# Patient Record
Sex: Female | Born: 1935 | Race: Black or African American | Hispanic: No | Smoking: Never smoker
Health system: Southern US, Community
[De-identification: ages and names within clinical notes are randomized; demographics above are authoritative.]

## PROBLEM LIST (undated history)

## (undated) DIAGNOSIS — C801 Malignant (primary) neoplasm, unspecified: Secondary | ICD-10-CM

## (undated) DIAGNOSIS — T8859XA Other complications of anesthesia, initial encounter: Secondary | ICD-10-CM

## (undated) DIAGNOSIS — I471 Supraventricular tachycardia, unspecified: Secondary | ICD-10-CM

## (undated) DIAGNOSIS — M1990 Unspecified osteoarthritis, unspecified site: Secondary | ICD-10-CM

## (undated) DIAGNOSIS — T4145XA Adverse effect of unspecified anesthetic, initial encounter: Secondary | ICD-10-CM

## (undated) DIAGNOSIS — K922 Gastrointestinal hemorrhage, unspecified: Secondary | ICD-10-CM

## (undated) DIAGNOSIS — I10 Essential (primary) hypertension: Secondary | ICD-10-CM

## (undated) DIAGNOSIS — E43 Unspecified severe protein-calorie malnutrition: Secondary | ICD-10-CM

## (undated) HISTORY — DX: Malignant (primary) neoplasm, unspecified: C80.1

## (undated) HISTORY — DX: Essential (primary) hypertension: I10

## (undated) HISTORY — PX: CHOLECYSTECTOMY: SHX55

## (undated) HISTORY — PX: ABDOMINAL HYSTERECTOMY: SHX81

## (undated) HISTORY — DX: Unspecified osteoarthritis, unspecified site: M19.90

---

## 2003-07-29 ENCOUNTER — Emergency Department (HOSPITAL_COMMUNITY): Admission: EM | Admit: 2003-07-29 | Discharge: 2003-07-29 | Payer: Self-pay | Admitting: Emergency Medicine

## 2004-05-05 ENCOUNTER — Emergency Department (HOSPITAL_COMMUNITY): Admission: EM | Admit: 2004-05-05 | Discharge: 2004-05-05 | Payer: Self-pay | Admitting: Emergency Medicine

## 2004-05-14 ENCOUNTER — Ambulatory Visit: Payer: Self-pay | Admitting: Internal Medicine

## 2004-05-25 ENCOUNTER — Emergency Department (HOSPITAL_COMMUNITY): Admission: EM | Admit: 2004-05-25 | Discharge: 2004-05-25 | Payer: Self-pay

## 2004-05-26 ENCOUNTER — Emergency Department (HOSPITAL_COMMUNITY): Admission: EM | Admit: 2004-05-26 | Discharge: 2004-05-26 | Payer: Self-pay

## 2004-05-26 ENCOUNTER — Ambulatory Visit: Payer: Self-pay | Admitting: Internal Medicine

## 2004-10-08 ENCOUNTER — Ambulatory Visit: Payer: Self-pay | Admitting: Internal Medicine

## 2004-12-25 ENCOUNTER — Ambulatory Visit: Payer: Self-pay | Admitting: Internal Medicine

## 2005-02-13 ENCOUNTER — Ambulatory Visit: Payer: Self-pay | Admitting: Internal Medicine

## 2005-02-13 ENCOUNTER — Ambulatory Visit: Payer: Self-pay | Admitting: Cardiology

## 2005-02-20 ENCOUNTER — Ambulatory Visit: Payer: Self-pay | Admitting: Internal Medicine

## 2005-03-01 ENCOUNTER — Ambulatory Visit: Payer: Self-pay | Admitting: Internal Medicine

## 2005-12-23 ENCOUNTER — Ambulatory Visit: Payer: Self-pay | Admitting: Internal Medicine

## 2005-12-27 ENCOUNTER — Emergency Department (HOSPITAL_COMMUNITY): Admission: EM | Admit: 2005-12-27 | Discharge: 2005-12-27 | Payer: Self-pay | Admitting: Emergency Medicine

## 2006-02-04 ENCOUNTER — Ambulatory Visit: Payer: Self-pay | Admitting: Internal Medicine

## 2006-05-12 ENCOUNTER — Ambulatory Visit: Payer: Self-pay | Admitting: Internal Medicine

## 2006-10-03 ENCOUNTER — Ambulatory Visit: Payer: Self-pay | Admitting: Internal Medicine

## 2007-01-05 ENCOUNTER — Emergency Department (HOSPITAL_COMMUNITY): Admission: EM | Admit: 2007-01-05 | Discharge: 2007-01-05 | Payer: Self-pay | Admitting: Emergency Medicine

## 2007-01-08 ENCOUNTER — Ambulatory Visit: Payer: Self-pay | Admitting: Internal Medicine

## 2007-02-03 ENCOUNTER — Encounter (INDEPENDENT_AMBULATORY_CARE_PROVIDER_SITE_OTHER): Payer: Self-pay | Admitting: Surgery

## 2007-02-03 ENCOUNTER — Ambulatory Visit (HOSPITAL_COMMUNITY): Admission: RE | Admit: 2007-02-03 | Discharge: 2007-02-04 | Payer: Self-pay | Admitting: Surgery

## 2007-09-16 ENCOUNTER — Encounter: Payer: Self-pay | Admitting: Internal Medicine

## 2007-09-30 ENCOUNTER — Encounter: Payer: Self-pay | Admitting: *Deleted

## 2007-09-30 DIAGNOSIS — K5730 Diverticulosis of large intestine without perforation or abscess without bleeding: Secondary | ICD-10-CM | POA: Insufficient documentation

## 2007-09-30 DIAGNOSIS — R519 Headache, unspecified: Secondary | ICD-10-CM | POA: Insufficient documentation

## 2007-09-30 DIAGNOSIS — H409 Unspecified glaucoma: Secondary | ICD-10-CM | POA: Insufficient documentation

## 2007-09-30 DIAGNOSIS — M479 Spondylosis, unspecified: Secondary | ICD-10-CM | POA: Insufficient documentation

## 2007-09-30 DIAGNOSIS — Z8719 Personal history of other diseases of the digestive system: Secondary | ICD-10-CM | POA: Insufficient documentation

## 2007-09-30 DIAGNOSIS — R51 Headache: Secondary | ICD-10-CM

## 2007-09-30 DIAGNOSIS — Z9089 Acquired absence of other organs: Secondary | ICD-10-CM

## 2007-09-30 DIAGNOSIS — Z9849 Cataract extraction status, unspecified eye: Secondary | ICD-10-CM

## 2007-09-30 DIAGNOSIS — I10 Essential (primary) hypertension: Secondary | ICD-10-CM | POA: Insufficient documentation

## 2008-03-10 ENCOUNTER — Ambulatory Visit: Payer: Self-pay | Admitting: Internal Medicine

## 2008-03-10 DIAGNOSIS — K219 Gastro-esophageal reflux disease without esophagitis: Secondary | ICD-10-CM | POA: Insufficient documentation

## 2008-03-10 LAB — CONVERTED CEMR LAB
BUN: 24 mg/dL — ABNORMAL HIGH (ref 6–23)
Basophils Absolute: 0.3 10*3/uL — ABNORMAL HIGH (ref 0.0–0.1)
Basophils Relative: 3.8 % — ABNORMAL HIGH (ref 0.0–3.0)
CO2: 31 meq/L (ref 19–32)
Calcium: 9.4 mg/dL (ref 8.4–10.5)
Chloride: 101 meq/L (ref 96–112)
Creatinine, Ser: 1.6 mg/dL — ABNORMAL HIGH (ref 0.4–1.2)
Eosinophils Absolute: 0.1 10*3/uL (ref 0.0–0.7)
Eosinophils Relative: 1.9 % (ref 0.0–5.0)
GFR calc Af Amer: 39 mL/min
GFR calc non Af Amer: 33 mL/min
Glucose, Bld: 107 mg/dL — ABNORMAL HIGH (ref 70–99)
HCT: 39.5 % (ref 36.0–46.0)
Hemoglobin: 13.5 g/dL (ref 12.0–15.0)
Lymphocytes Relative: 26.5 % (ref 12.0–46.0)
MCHC: 34.1 g/dL (ref 30.0–36.0)
MCV: 87.4 fL (ref 78.0–100.0)
Monocytes Absolute: 0.4 10*3/uL (ref 0.1–1.0)
Monocytes Relative: 5.3 % (ref 3.0–12.0)
Neutro Abs: 4.6 10*3/uL (ref 1.4–7.7)
Neutrophils Relative %: 62.5 % (ref 43.0–77.0)
Platelets: 309 10*3/uL (ref 150–400)
Potassium: 4.3 meq/L (ref 3.5–5.1)
RBC: 4.52 M/uL (ref 3.87–5.11)
RDW: 13.2 % (ref 11.5–14.6)
Sodium: 140 meq/L (ref 135–145)
WBC: 7.3 10*3/uL (ref 4.5–10.5)

## 2008-03-14 ENCOUNTER — Encounter: Payer: Self-pay | Admitting: Internal Medicine

## 2008-09-15 ENCOUNTER — Encounter: Payer: Self-pay | Admitting: Internal Medicine

## 2008-11-13 ENCOUNTER — Emergency Department (HOSPITAL_COMMUNITY): Admission: EM | Admit: 2008-11-13 | Discharge: 2008-11-13 | Payer: Self-pay | Admitting: Emergency Medicine

## 2009-06-03 HISTORY — PX: BREAST SURGERY: SHX581

## 2009-06-21 ENCOUNTER — Telehealth: Payer: Self-pay | Admitting: Internal Medicine

## 2009-11-21 ENCOUNTER — Encounter: Payer: Self-pay | Admitting: Internal Medicine

## 2009-11-27 ENCOUNTER — Telehealth: Payer: Self-pay | Admitting: Internal Medicine

## 2009-11-30 ENCOUNTER — Encounter: Payer: Self-pay | Admitting: Internal Medicine

## 2009-12-08 ENCOUNTER — Telehealth: Payer: Self-pay | Admitting: Internal Medicine

## 2009-12-09 ENCOUNTER — Encounter: Payer: Self-pay | Admitting: Internal Medicine

## 2009-12-20 ENCOUNTER — Encounter: Payer: Self-pay | Admitting: Internal Medicine

## 2009-12-21 ENCOUNTER — Encounter: Payer: Self-pay | Admitting: Internal Medicine

## 2009-12-26 ENCOUNTER — Encounter: Admission: RE | Admit: 2009-12-26 | Discharge: 2009-12-26 | Payer: Self-pay | Admitting: Radiology

## 2009-12-28 ENCOUNTER — Ambulatory Visit: Payer: Self-pay | Admitting: Internal Medicine

## 2009-12-28 DIAGNOSIS — D0590 Unspecified type of carcinoma in situ of unspecified breast: Secondary | ICD-10-CM

## 2010-01-05 ENCOUNTER — Encounter: Payer: Self-pay | Admitting: Internal Medicine

## 2010-01-22 ENCOUNTER — Ambulatory Visit (HOSPITAL_COMMUNITY): Admission: RE | Admit: 2010-01-22 | Discharge: 2010-01-22 | Payer: Self-pay | Admitting: Surgery

## 2010-02-01 ENCOUNTER — Ambulatory Visit: Payer: Self-pay | Admitting: Oncology

## 2010-02-06 ENCOUNTER — Encounter: Payer: Self-pay | Admitting: Internal Medicine

## 2010-06-03 HISTORY — PX: BREAST SURGERY: SHX581

## 2010-07-05 NOTE — Medication Information (Signed)
Summary: Order/[facility]  Order/[facility]   Imported By: Lester [place] 12/13/2009 08:56:42  _____________________________________________________________________  External Attachment:    Type:   Image     Comment:   External Document

## 2010-07-05 NOTE — Letter (Signed)
Summary: [facility]   Imported By: Lennie Odor 02/19/2010 15:11:01  _____________________________________________________________________  External Attachment:    Type:   Image     Comment:   External Document

## 2010-07-05 NOTE — Progress Notes (Signed)
Summary: HYDRALAZINE REFILL  Phone Note Call from Patient Call back at Home Phone [phone]   Caller: Patient Summary of Call: MS Linhart ONLY HAS ENOUGH OF HER BP MEDS FOR TODAY.  LOV WAS 03-10-08.  SHE MADE A 30 MIN APPT ON JULY 28.  PLEASE CALL IN HER HYDRALAZINE 50 MG TO LAST UNTIL HER APPT.  HER PHARMACY IS [facility] [address]  HER PHONE NUMBER IS [phone]. Initial call taken by: Hilarie Fredrickson,  December 08, 2009 9:27 AM    Prescriptions: HYDRALAZINE HCL 50 MG  TABS (HYDRALAZINE HCL) Take three times a day  #90 Tablet x 0   Entered by:   Lamar Sprinkles, CMA   Authorized by:   Jacques Navy MD   Signed by:   Lamar Sprinkles, CMA on 12/08/2009   Method used:   Electronically to        [facility] [id number] (retail)       [address] [place], [place]  [address]       Ph: [phone]       Fax: [phone]   RxID:   [id number]

## 2010-07-05 NOTE — Assessment & Plan Note (Signed)
Summary: YEARLY FU/ NEEDS BP MEDS REFILLED/ LABS LATER/ NWS  #   Vital Signs:  Patient profile:   77 year old female Height:      66 inches Weight:      179 pounds BMI:     29.00 O2 Sat:      97 % on Room air Temp:     98.0 degrees F oral Pulse rate:   100 / minute BP sitting:   146 / 78  (left arm) Cuff size:   regular  Vitals Entered By: Bill Salinas CMA (December 28, 2009 1:35 PM)  O2 Flow:  Room air CC: cpx/ ab  Vision Screening:      Vision Comments: Pt has eye exam every 3 months, glaucoma both eyes   Primary Care Provider:  Jacques Navy MD  CC:  cpx/ ab.  History of Present Illness: Patient presents for follow-up. She recently had an abnormal mammogram with microcalcifications. This led to core needle biopsy returned as intraductal carcinoma in-situ. She has had breast MRI that was negative for any other lesions. She is now scheduled for surgical consult. She is very up-set and concerned that she may have cancer in other parts of her body and that the breast cancer is life-threatening.  She is otherwise doing well with no new medical problems or concerns.   Current Medications (verified): 1)  Lisinopril 20 Mg Tabs (Lisinopril) .... Take 1 Tablet By Mouth Once A Day 2)  Furosemide 40 Mg  Tabs (Furosemide) .... Take 1 Tablet By Mouth Once A Day 3)  Travatan 0.004 %  Soln (Travoprost) .... Use Daily As Directed. 4)  Hydralazine Hcl 50 Mg  Tabs (Hydralazine Hcl) .... Take Three Times A Day 5)  Tylenol 325 Mg Tabs (Acetaminophen) .... As Directed As Needed 6)  Zantac 150 Mg Tabs (Ranitidine Hcl) .[name].. 1 Tab As Needed  Allergies (verified): 1)  ! 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 CATARACT EXTRACTION, HX OF (ICD-V45.61) CHOLECYSTECTOMY, LAPAROSCOPIC, HX OF (ICD-V45.79) Hysterectomy  Family History: Reviewed history from 03/10/2008 and no changes required. non-contributory in a octagenarian  Social History: Reviewed history from 03/10/2008 and no changes required. 8th grade married '40-1980's  widowed 5 -sons; 2 daughters; lots of grandchildren; several great-grandchildren; 3 children in [place] who can look in on her. Lives in her own house; I-ADLs  Review of Systems  The patient denies anorexia, fever, weight loss, weight gain, chest pain, dyspnea on exertion, peripheral edema, prolonged cough, headaches, abdominal pain, severe indigestion/heartburn, incontinence, muscle weakness, difficulty walking, depression, unusual weight change, and enlarged lymph nodes.    Physical Exam  General:  WNWD elderly AA female who looks younger than her stated age.  Head:  Normocephalic and atraumatic without obvious abnormalities. No apparent alopecia or balding. Eyes:  C&S clear, pupils equal Neck:  supple and full ROM.   Lungs:  Normal respiratory effort, chest expands symmetrically. Lungs are clear to auscultation, no crackles or wheezes. Heart:  normal rate and regular rhythm.  Msk:  normal ROM, no joint swelling, and no redness over joints.   Pulses:  2+ radial Neurologic:  alert & oriented X3, cranial nerves II-XII intact, and gait normal.   Skin:  turgor normal and color normal.   Psych:  Oriented X3, memory intact for recent and remote, normally interactive, and good eye contact.     Impression & Recommendations:  Problem # 1:  CARCINOMA IN SITU OF  BREAST (ICD-233.0) Reviewed all available records in eChart and Imagecast. Discussed the general progrnosis with the patient - good prognosis and that she will most likely be offered lumpectomy which she be curative. At her advanced age  I doubbt she will require any adjuvant therapy asided from possibly tamoxifen or evista. She is reassured that studies to date have not revealed any spread of disease and that this is a less aggressive form of breast cancer than others.   Problem # 2:  GERD (ICD-530.81) Stable on her present medication  Her updated medication list for this problem includes:    Zantac 150 Mg Tabs (Ranitidine hcl) .[name]... 1 tab as needed  Problem # 3:  HYPERTENSION (ICD-401.9)  Her updated medication list for this problem includes:    Lisinopril 20 Mg Tabs (Lisinopril) .[name]... Take 1 tablet by mouth once a day    Furosemide 40 Mg Tabs (Furosemide) .[name]... Take 1 tablet by mouth once a day    Hydralazine Hcl 50 Mg Tabs (Hydralazine hcl) .[name]... Take three times a day  BP today: 146/78 Prior BP: 140/80 (03/10/2008)  Suboptimal but sufficient control. Will continue her present medications.   Complete Medication List: 1)  Lisinopril 20 Mg Tabs (Lisinopril) .... Take 1 tablet by mouth once a day 2)  Furosemide 40 Mg Tabs (Furosemide) .... Take 1 tablet by mouth once a day 3)  Travatan 0.004 % Soln (Travoprost) .... Use daily as directed. 4)  Hydralazine Hcl 50 Mg Tabs (Hydralazine hcl) .... Take three times a day 5)  Tylenol 325 Mg Tabs (Acetaminophen) .... As directed as needed 6)  Zantac 150 Mg Tabs (Ranitidine hcl) .[name].. 1 tab as needed 7)  Temazepam 15 Mg Caps (Temazepam) .[name].. 1 by mouth at bedtime Prescriptions: TEMAZEPAM 15 MG CAPS (TEMAZEPAM) 1 by mouth at bedtime  #30 x 5   Entered and Authorized by:   Jacques Navy MD   Signed by:   Jacques Navy MD on 12/28/2009   Method used:   Print then Give to Patient   RxID:   [id number] ZANTAC 150 MG TABS (RANITIDINE  HCL) 1 tab as needed  #60 x 12   Entered and Authorized by:   Jacques Navy MD   Signed by:   Jacques Navy MD on 12/28/2009   Method used:   Electronically to        [facility] [id number] (retail)       [address] [place], [place]  [address]       Ph: [phone]       Fax: [phone]   RxID:   [id number] HYDRALAZINE HCL 50 MG  TABS (HYDRALAZINE HCL) Take three times a day  #90 Tablet x 12   Entered and Authorized by:   Jacques Navy MD   Signed by:   Jacques Navy MD on 12/28/2009   Method used:   Electronically to  [facility] [id number] (retail)       [address]       [place], [place]  [address]       Ph: [phone]       Fax: [phone]   RxID:   [id number] FUROSEMIDE 40 MG  TABS (FUROSEMIDE) Take 1 tablet by mouth once a day  #30 Tablet x 12   Entered and Authorized by:   Jacques Navy MD   Signed by:   Jacques Navy MD on 12/28/2009   Method used:   Electronically to        [facility] [id number] (retail)       [address] [place], [place]  [address]       Ph: [phone]       Fax: [phone]   RxID:   [id number] LISINOPRIL 20 MG TABS (LISINOPRIL) Take 1 tablet by mouth once a day  #30 Tablet x 12   Entered and Authorized by:   Jacques Navy MD   Signed by:   Jacques Navy MD on 12/28/2009   Method used:   Electronically to        [facility] [id number] (retail)       [address] [place], [place]  [address]       Ph: [phone]       Fax: [phone]   RxID:   [id number]

## 2010-07-05 NOTE — Consult Note (Signed)
Summary: [facility]   Imported By: Lester [place] 01/19/2010 10:27:56  _____________________________________________________________________  External Attachment:    Type:   Image     Comment:   External Document

## 2010-07-05 NOTE — Progress Notes (Signed)
  Phone Note Refill Request Message from:  Fax from Pharmacy on June 21, 2009 2:58 PM  Refills Requested: Medication #1:  LISINOPRIL 20 MG TABS Take 1 tablet by mouth once a day Initial call taken by: Ami Bullins CMA,  June 21, 2009 2:58 PM    Prescriptions: LISINOPRIL 20 MG TABS (LISINOPRIL) Take 1 tablet by mouth once a day  #30 Tablet x 2   Entered by:   Ami Bullins CMA   Authorized by:   Jacques Navy MD   Signed by:   Bill Salinas CMA on 06/21/2009   Method used:   Electronically to        [facility] [id number] (retail)       [address] [place], [place]  [address]       Ph: [phone]       Fax: [phone]   RxID:   [id number]

## 2010-07-05 NOTE — Progress Notes (Signed)
  Phone Note Refill Request Message from:  Fax from Pharmacy on November 27, 2009 11:31 AM  Refills Requested: Medication #1:  FUROSEMIDE 40 MG  TABS Take 1 tablet by mouth once a day last office visit was in 2009, Please Advise refill  Initial call taken by: Ami Bullins CMA,  November 27, 2009 11:31 AM  Follow-up for Phone Call        ok to refill for one month. Will need f/u OV Follow-up by: Jacques Navy MD,  November 27, 2009 11:50 AM    Prescriptions: FUROSEMIDE 40 MG  TABS (FUROSEMIDE) Take 1 tablet by mouth once a day  #30 Tablet x 2   Entered by:   Rock Nephew CMA   Authorized by:   Jacques Navy MD   Signed by:   Rock Nephew CMA on 11/27/2009   Method used:   Electronically to        [facility] [id number] (retail)       [address] [place], [place]  [address]       Ph: [phone]       Fax: [phone]   RxID:   [id number]

## 2010-07-26 ENCOUNTER — Encounter: Payer: Self-pay | Admitting: Internal Medicine

## 2010-07-26 ENCOUNTER — Encounter (HOSPITAL_BASED_OUTPATIENT_CLINIC_OR_DEPARTMENT_OTHER): Payer: Medicare Other | Admitting: Oncology

## 2010-07-26 ENCOUNTER — Other Ambulatory Visit: Payer: Self-pay | Admitting: Oncology

## 2010-07-26 DIAGNOSIS — Z170 Estrogen receptor positive status [ER+]: Secondary | ICD-10-CM

## 2010-07-26 DIAGNOSIS — D0590 Unspecified type of carcinoma in situ of unspecified breast: Secondary | ICD-10-CM

## 2010-07-26 LAB — CBC WITH DIFFERENTIAL/PLATELET
BASO%: 0.4 % (ref 0.0–2.0)
EOS%: 2.4 % (ref 0.0–7.0)
Eosinophils Absolute: 0.2 10*3/uL (ref 0.0–0.5)
HCT: 41.2 % (ref 34.8–46.6)
MCH: 28.6 pg (ref 25.1–34.0)
MCHC: 33.3 g/dL (ref 31.5–36.0)
MCV: 86 fL (ref 79.5–101.0)
MONO#: 0.7 10*3/uL (ref 0.1–0.9)
MONO%: 8.6 % (ref 0.0–14.0)
NEUT%: 55.7 % (ref 38.4–76.8)
Platelets: 297 10*3/uL (ref 145–400)
RBC: 4.79 10*6/uL (ref 3.70–5.45)
RDW: 14 % (ref 11.2–14.5)
lymph#: 2.5 10*3/uL (ref 0.9–3.3)
nRBC: 0 % (ref 0–0)

## 2010-08-14 NOTE — Letter (Signed)
Summary: [facility]  [facility]   Imported By: Lennie Odor 08/09/2010 09:48:33  _____________________________________________________________________  External Attachment:    Type:   Image     Comment:   External Document

## 2010-08-17 LAB — COMPREHENSIVE METABOLIC PANEL
ALT: 11 U/L (ref 0–35)
AST: 21 U/L (ref 0–37)
Albumin: 3.8 g/dL (ref 3.5–5.2)
Alkaline Phosphatase: 91 U/L (ref 39–117)
BUN: 18 mg/dL (ref 6–23)
CO2: 31 mEq/L (ref 19–32)
Calcium: 9.8 mg/dL (ref 8.4–10.5)
Chloride: 106 mEq/L (ref 96–112)
Creatinine, Ser: 0.81 mg/dL (ref 0.4–1.2)
GFR calc Af Amer: >60 mL/min (ref >60)
GFR calc non Af Amer: >60 mL/min (ref >60)
Glucose, Bld: 94 mg/dL (ref 70–99)
Potassium: 4.2 mEq/L (ref 3.5–5.1)
Sodium: 144 mEq/L (ref 135–145)
Total Bilirubin: 0.7 mg/dL (ref 0.3–1.2)
Total Protein: 7.3 g/dL (ref 6.0–8.3)

## 2010-08-17 LAB — DIFFERENTIAL
Lymphocytes Relative: 24 % (ref 12–46)
Lymphs Abs: 1.8 10*3/uL (ref 0.7–4.0)
Monocytes Absolute: 0.5 10*3/uL (ref 0.1–1.0)
Monocytes Relative: 8 % (ref 3–12)
Neutro Abs: 4.8 10*3/uL (ref 1.7–7.7)

## 2010-08-17 LAB — CBC
HCT: 41.5 % (ref 36.0–46.0)
Hemoglobin: 13.8 g/dL (ref 12.0–15.0)
MCH: 29.6 pg (ref 26.0–34.0)
MCHC: 33.3 g/dL (ref 30.0–36.0)
MCV: 88.9 fL (ref 78.0–100.0)
Platelets: 311 10*3/uL (ref 150–400)
RBC: 4.67 MIL/uL (ref 3.87–5.11)
RDW: 13.9 % (ref 11.5–15.5)
WBC: 7.2 10*3/uL (ref 4.0–10.5)

## 2010-08-17 LAB — CANCER ANTIGEN 27.29: CA 27.29: 28 U/mL (ref 0–39)

## 2010-08-17 LAB — SURGICAL PCR SCREEN: MRSA, PCR: POSITIVE — AB

## 2010-09-10 LAB — URINE CULTURE: Colony Count: 9000

## 2010-09-10 LAB — URINALYSIS, ROUTINE W REFLEX MICROSCOPIC
Bilirubin Urine: NEGATIVE
Glucose, UA: NEGATIVE mg/dL
Hgb urine dipstick: NEGATIVE
Ketones, ur: NEGATIVE mg/dL
Nitrite: NEGATIVE
Protein, ur: NEGATIVE mg/dL
Specific Gravity, Urine: 1.018 (ref 1.005–1.030)
Urobilinogen, UA: 1 mg/dL (ref 0.0–1.0)
pH: 6 (ref 5.0–8.0)

## 2010-09-10 LAB — DIFFERENTIAL
Basophils Absolute: 0 10*3/uL (ref 0.0–0.1)
Basophils Relative: 0 % (ref 0–1)
Eosinophils Absolute: 0.1 10*3/uL (ref 0.0–0.7)
Eosinophils Relative: 2 % (ref 0–5)
Lymphocytes Relative: 23 % (ref 12–46)
Lymphs Abs: 1.5 10*3/uL (ref 0.7–4.0)
Monocytes Absolute: 0.4 10*3/uL (ref 0.1–1.0)
Monocytes Relative: 7 % (ref 3–12)
Neutro Abs: 4.5 10*3/uL (ref 1.7–7.7)
Neutrophils Relative %: 69 % (ref 43–77)

## 2010-09-10 LAB — COMPREHENSIVE METABOLIC PANEL WITH GFR
AST: 17 U/L (ref 0–37)
Albumin: 3.7 g/dL (ref 3.5–5.2)
Alkaline Phosphatase: 79 U/L (ref 39–117)
BUN: 11 mg/dL (ref 6–23)
Creatinine, Ser: 0.83 mg/dL (ref 0.4–1.2)
GFR calc Af Amer: >60 mL/min (ref >60)
Potassium: 3.4 meq/L — ABNORMAL LOW (ref 3.5–5.1)
Total Protein: 7.2 g/dL (ref 6.0–8.3)

## 2010-09-10 LAB — COMPREHENSIVE METABOLIC PANEL
ALT: 12 U/L (ref 0–35)
CO2: 26 mEq/L (ref 19–32)
Calcium: 9 mg/dL (ref 8.4–10.5)
Chloride: 104 mEq/L (ref 96–112)
GFR calc non Af Amer: >60 mL/min (ref >60)
Glucose, Bld: 103 mg/dL — ABNORMAL HIGH (ref 70–99)
Sodium: 137 mEq/L (ref 135–145)
Total Bilirubin: 0.7 mg/dL (ref 0.3–1.2)

## 2010-09-10 LAB — CBC
HCT: 41.4 % (ref 36.0–46.0)
Hemoglobin: 13.8 g/dL (ref 12.0–15.0)
MCHC: 33.4 g/dL (ref 30.0–36.0)
MCV: 88.2 fL (ref 78.0–100.0)
Platelets: 279 10*3/uL (ref 150–400)
RBC: 4.69 MIL/uL (ref 3.87–5.11)
RDW: 13.9 % (ref 11.5–15.5)
WBC: 6.5 10*3/uL (ref 4.0–10.5)

## 2010-09-10 LAB — URINE MICROSCOPIC-ADD ON

## 2010-09-10 LAB — LIPASE, BLOOD: Lipase: 31 U/L (ref 11–59)

## 2010-10-16 NOTE — Assessment & Plan Note (Signed)
[name]                           PRIMARY CARE OFFICE NOTE   [name], [name]                     MRN:          [id number]  DATE:01/08/2007                            DOB:          02/04/1921    [name] is an 77 year old African-American woman who presents for  follow up evaluation after being seen in the ER for abdominal pain.   The patient was seen at the [facility] emergency department after having  several episodes of severe pain in the right upper quadrant accompanied  by nausea with no vomiting. Please see the note from the ER physician.  The patient's evaluation was significant for positive gallstone about  2.4 cm in the gallbladder neck. She had no gallbladder wall thickening,  no pericholecystic fluid, data consistent with chololithiasis without  cholecystitis. Her lab work revealed [place] of [address], hemoglobin  was 13.7. Chemistries were unremarkable except for a glucose of 114.  Liver functions were normal. Lipase was normal at 20. The patient's pain  has resolved, but it still recurs intermittently, particularly when  associated with certain types of meals, particularly those with fat. At  this point, she is asking my opinion regarding surgery and a referral if  needed.   PAST MEDICAL HISTORY:   SURGICAL:  TAH remote.   MEDICAL:  1. Glaucoma.  2. Hypertension.  3. Chronic headache.  4. DJD of the lumbar spine.  5. 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   CURRENT MEDICATIONS:  1. Travatan eye drops.  2. Hydralazine 50 mg t.i.d.  3. Lisinopril 20 mg daily.  4. Furosemide 40 mg daily.  5. Tylenol p.r.n.   FAMILY HISTORY:  Negative for breast cancer, colon cancer, or coronary  artery disease.   SOCIAL HISTORY:  The patient is widowed after a 21 year marriage. She  has five sons, one of whom is deceased. She has two daughters, one of  whom is deceased. She has multiple grandchildren,  and at least 3 great-  grandchildren. Her family is very supportive.   CHART REVIEW:  Her last mammogram from April 15, 2006 was negative.  Colorectal cancer screening with flexible sigmoidoscopy in February 2003  that was negative.   REVIEW OF SYSTEMS:  The patient has had no fevers or chills. There have  been no ophthalmologic or ENT complaints. No chest pain, chest  discomfort, or palpitations. There are no respiratory complaints. GI as  above. There are no GU problems. There are no neurologic problems.   PHYSICAL EXAMINATION:  VITAL SIGNS:  Temperature 97, blood pressure not  obtained, weight 186.  GENERAL:  This is a heavy set African-American woman who looks younger  than her stated chronologic age in no acute distress.  CHEST:  Clear.  ABDOMEN:  The patient had some mild tenderness in the right upper  quadrant.   No further examination was conducted.   IMPRESSION AND PLAN:  Chololithiasis. The patient with a large 2.5  centimeter gallstone at the neck of the bladder with episodes of biliary  colic. Fortunately, there is no evidence of cholecystitis  with normal  liver functions and a white count minimally elevated. I discussed this  at length with the patient explaining her situation. I have recommended  that she consider elective surgery. Plan: The patient is referred to  [facility] for the first available appointment.     Rosalyn Gess Norins, MD  Electronically Signed    MEN/MedQ  DD: 01/09/2007  DT: 01/09/2007  Job #: [id number]

## 2010-10-16 NOTE — Op Note (Signed)
NAME:  [name], [name]              ACCOUNT NO.:  [id number]   MEDICAL RECORD NO.:  [id number]          PATIENT TYPE:  OIB   LOCATION:  5713                         FACILITY:  [facility]   PHYSICIAN:  Wilmon Arms. Corliss Skains, M.D. DATE OF BIRTH:  02/04/1921   DATE OF PROCEDURE:  02/03/2007  DATE OF DISCHARGE:                               OPERATIVE REPORT   PREOPERATIVE DIAGNOSIS:  Chronic calculus cholecystitis.   POSTOPERATIVE DIAGNOSIS:  Chronic calculus cholecystitis.   PROCEDURE PERFORMED:  Laparoscopic cholecystectomy with intraoperative  cholangiogram.   SURGEON:  Wilmon Arms. Tsuei, M.D.   ANESTHESIA:  General.   INDICATIONS:  The patient is an 77 year old female who recently had an  attack of acute right upper quadrant abdominal pain.  She had nausea but  no vomiting.  She has also noted a lot of extra gas and bloating.  An  ultrasound showed a large gallstone but no sign of cholecystitis.  Her  white count at the time of her ER visit was elevated at 12.9.  Liver  function tests were normal.  She presents now for elective  cholecystectomy.  Her white count now is normal and her liver function  tests remain normal.   DESCRIPTION OF PROCEDURE:  The patient is brought to the operating room  and placed in the supine position on the operating room table.  After an  adequate level of general anesthesia was obtained, the patient's abdomen  was prepped with Betadine and draped in a sterile fashion.  A time out  was taken to assure the proper patient and proper procedure.  The area  below her umbilicus was infiltrated with 0.25% Marcaine.  I used the  upper end of her previous midline incision.  Dissection was carried down  to the fascia.  The fascia was opened vertically.  The peritoneal cavity  was bluntly entered.  A stay suture of 0 Vicryl was placed around the  fascial opening.  Pneumoperitoneum was obtained by insufflating CO2  maintaining maximal pressure of 15 mmHg.  The laparoscope  was inserted  and  the patient was tilted to her left in reverse Trendelenburg  position.  A 10 mm port was placed in the subxiphoid position.  Two 5 mm  ports were placed in the right upper quadrant.   The gallbladder seemed very thickened was under lot of omental  adhesions.  These omental adhesions were bluntly dissected away.  We  were able to grasp the surface of the gallbladder and elevate it over  the edge of the liver.  I was able to take down most of the adhesions.  We identified the hilum of the gallbladder.  We bluntly dissected around  the cystic duct.  The cystic artery was also identified.  The cystic  duct was ligated and clipped distally.  A small opening was created on  the cystic duct.  A Cook cholangiogram catheter was brought through a  stab incision and threaded into the cystic duct.  It was secured with a  clip.  A cholangiogram was obtained which showed good flow proximally  and distally in the biliary tree  with no sign of obstruction.  Contrast  flowed easily into the duodenum.  A low grade contrast opacified the  pancreatic duct.  The catheter was then removed.  The cystic duct was  ligated with clips and divided.  The cystic artery was also ligated with  clips and divided.  Cautery was then used to remove the gallbladder from  the liver bed.  This was very difficult due to the thickened gallbladder  wall.  We were able to dissect this free from the liver.  The  gallbladder was placed in an EndoCatch sac.   We irrigated the right upper quadrant and suctioned out the irrigation.  No bleeding was noted.  The gallbladder and EndoCatch sac were brought  up to the umbilical opening.  The fascial incision was opened slightly.  The gallbladder was then removed.  The gallbladder contained a very  large single gallstone measuring about 3 cm across.  The pursestring  suture was used to close the umbilical fascia.  We suctioned out the  irrigation.  We reinspected for  hemostasis in the right upper quadrant.  Pneumoperitoneum was then released as the ports were removed under  direct vision.  4-0 Monocryl was used to close the skin incisions.  Steri-Strips and clean dressings were applied.  The patient was  extubated and brought to recovery in stable condition.  All sponge,  instrument, and needle counts were correct.      Wilmon Arms. Tsuei, M.D.  Electronically Signed     MKT/MEDQ  D:  02/03/2007  T:  02/03/2007  Job:  [id number]   cc:   Rosalyn Gess. Norins, MD

## 2011-01-07 ENCOUNTER — Other Ambulatory Visit: Payer: Self-pay

## 2011-01-08 ENCOUNTER — Other Ambulatory Visit: Payer: Self-pay | Admitting: Radiology

## 2011-01-08 DIAGNOSIS — C50912 Malignant neoplasm of unspecified site of left female breast: Secondary | ICD-10-CM

## 2011-01-09 ENCOUNTER — Encounter (INDEPENDENT_AMBULATORY_CARE_PROVIDER_SITE_OTHER): Payer: Self-pay | Admitting: Surgery

## 2011-01-10 ENCOUNTER — Encounter (INDEPENDENT_AMBULATORY_CARE_PROVIDER_SITE_OTHER): Payer: Self-pay | Admitting: Surgery

## 2011-01-10 ENCOUNTER — Ambulatory Visit (INDEPENDENT_AMBULATORY_CARE_PROVIDER_SITE_OTHER): Payer: Medicare Other | Admitting: Surgery

## 2011-01-10 VITALS — BP 144/72 | HR 82 | Temp 97.7°F | Ht 66.5 in | Wt 170.0 lb

## 2011-01-10 DIAGNOSIS — D0590 Unspecified type of carcinoma in situ of unspecified breast: Secondary | ICD-10-CM

## 2011-01-10 DIAGNOSIS — D0510 Intraductal carcinoma in situ of unspecified breast: Secondary | ICD-10-CM | POA: Insufficient documentation

## 2011-01-10 NOTE — Progress Notes (Signed)
Blood drawn from right inner forearm w/o difficulty for BUN/crea for MRI Saturday.

## 2011-01-10 NOTE — Progress Notes (Signed)
[name] is a 77 y.o. female.    Chief Complaint  Patient presents with  . Other    Eval new lt br cancer    HPI HPI This patient is status post central breast excision one year ago showing DCIS with no sign of invasion. All the margins were clear. The patient saw Dr. Darnelle Catalan and they decided to not pursue any adjuvant therapy. The patient had been doing quite well but went for her followup mammogram this year. Her mammogram on August 1 showed new calcifications at the lumpectomy site. She then underwent stereotactic biopsy on 8/6 which showed DCIS low to intermediate grade with necrosis and calcification. The patient now returns for further surgical evaluation. She has had no complaints and noted significant changes in her medical status.  Past Medical History  Diagnosis Date  . Hypertension   . Cancer   . Glaucoma   . Allergy   . Arthritis   . Ulcer     Past Surgical History  Procedure Date  . Cholecystectomy   . Breast surgery 2011    History reviewed. No pertinent family history.  Social History History  Substance Use Topics  . Smoking status: Never Smoker   . Smokeless tobacco: Never Used  . Alcohol Use: No    Allergies  Allergen Reactions  . Codeine     REACTION: causes nausea and vomiting    Current Outpatient Prescriptions  Medication Sig Dispense Refill  . acetaminophen (TYLENOL) 100 MG/ML solution Take 10 mg/kg by mouth every 4 (four) hours as needed.        . furosemide (LASIX) 40 MG tablet Take 40 mg by mouth 2 (two) times daily.        . hydrALAZINE (APRESOLINE) 50 MG tablet Take 50 mg by mouth 3 (three) times daily.        [name] lisinopril (PRINIVIL,ZESTRIL) 20 MG tablet Take 20 mg by mouth daily.        Bertram Gala Glycol-Propyl Glycol (SYSTANE ULTRA) 0.4-0.3 % SOLN Apply to eye.        . ranitidine (ZANTAC) 150 MG tablet Take 150 mg by mouth 2 (two) times daily.          Review of Systems ROS Review of systems positive for reflux  hypertension glaucoma, dentures, arthritis Physical Exam Physical Exam   Blood pressure 144/72, pulse 82, temperature 97.7 F (36.5 C), temperature source Temporal, height 5' 6.5" (1.689 m), weight 170 lb (77.111 kg). WDWN in NAD HEENT:  EOMI, sclera anicteric Neck:  No masses, no thyromegaly Lungs:  CTA bilaterally; normal respiratory effort Breasts:  No palpable masses or lymphadenopathy in the right breast.  Left breast - healed central incision; no palpable masses; no lymphadenopathy CV:  Regular rate and rhythm; no murmurs Abd:  +bowel sounds, soft, non-tender, no masses Ext:  Well-perfused; no edema Skin:  Warm, dry; no sign of jaundice  Assessment/Plan Recurrent DCIS - left breast  Plan:  The patient has already been scheduled for bilateral breast MRI later this week.  At this point, we will plan a left mastectomy, with the understanding that the MRI might show US findings that would require workup of the contralateral breast.  The surgical procedure has been discussed with the patient.  Potential risks, benefits, alternative treatments, and expected outcomes have been explained.  All of the patient's questions at this time have been answered.  The patient understand the proposed surgical procedure and wishes to proceed.  The patient and her  family had actually discussed mastectomy among themselves before the visit today.  They were happy with the care that they received from Dr. Darnelle Catalan and me, and were eager to return for further care.   They also requested that they have all of their subsequent imaging done at [facility] instead of [facility].  Giuliano Preece K. 01/10/2011, 10:44 AM

## 2011-01-10 NOTE — Patient Instructions (Signed)
Schedule left mastectomy.  If the MRI shows anything that would change our plan, I will call you.

## 2011-01-12 ENCOUNTER — Ambulatory Visit
Admission: RE | Admit: 2011-01-12 | Discharge: 2011-01-12 | Disposition: A | Discharge status: Home / Self Care | Payer: Medicare Other | Source: Ambulatory Visit | Attending: Radiology | Admitting: Radiology

## 2011-01-12 DIAGNOSIS — C50912 Malignant neoplasm of unspecified site of left female breast: Secondary | ICD-10-CM

## 2011-01-12 MED ORDER — GADOBENATE DIMEGLUMINE 529 MG/ML IV SOLN
15.0000 mL | Freq: Once | INTRAVENOUS | Status: AC | PRN
  Administered 2011-01-12: 15 mL via INTRAVENOUS

## 2011-01-14 ENCOUNTER — Encounter: Payer: Self-pay | Admitting: Internal Medicine

## 2011-01-17 ENCOUNTER — Other Ambulatory Visit: Payer: Self-pay | Admitting: Internal Medicine

## 2011-01-18 ENCOUNTER — Encounter (HOSPITAL_COMMUNITY)
Admission: RE | Admit: 2011-01-18 | Discharge: 2011-01-18 | Disposition: A | Discharge status: Home / Self Care | Payer: Medicare Other | Source: Ambulatory Visit | Attending: Surgery | Admitting: Surgery

## 2011-01-18 ENCOUNTER — Other Ambulatory Visit (INDEPENDENT_AMBULATORY_CARE_PROVIDER_SITE_OTHER): Payer: Self-pay | Admitting: Surgery

## 2011-01-18 DIAGNOSIS — C50912 Malignant neoplasm of unspecified site of left female breast: Secondary | ICD-10-CM

## 2011-01-18 LAB — DIFFERENTIAL
Basophils Absolute: 0 10*3/uL (ref 0.0–0.1)
Basophils Relative: 0 % (ref 0–1)
Eosinophils Absolute: 0.2 10*3/uL (ref 0.0–0.7)
Eosinophils Relative: 2 % (ref 0–5)
Lymphs Abs: 1.9 10*3/uL (ref 0.7–4.0)
Neutrophils Relative %: 63 % (ref 43–77)

## 2011-01-18 LAB — CBC
Platelets: 334 10*3/uL (ref 150–400)
RBC: 4.64 MIL/uL (ref 3.87–5.11)
RDW: 14.1 % (ref 11.5–15.5)
WBC: 7.2 10*3/uL (ref 4.0–10.5)

## 2011-01-18 LAB — SURGICAL PCR SCREEN
MRSA, PCR: NEGATIVE
Staphylococcus aureus: NEGATIVE

## 2011-01-18 LAB — BASIC METABOLIC PANEL
CO2: 30 mEq/L (ref 19–32)
Chloride: 105 mEq/L (ref 96–112)
Creatinine, Ser: 0.77 mg/dL (ref 0.50–1.10)
GFR calc Af Amer: >60 mL/min (ref >60)
Sodium: 141 mEq/L (ref 135–145)

## 2011-01-23 ENCOUNTER — Encounter (HOSPITAL_BASED_OUTPATIENT_CLINIC_OR_DEPARTMENT_OTHER): Payer: Medicare Other | Admitting: Oncology

## 2011-01-23 ENCOUNTER — Other Ambulatory Visit: Payer: Self-pay | Admitting: Oncology

## 2011-01-23 DIAGNOSIS — Z170 Estrogen receptor positive status [ER+]: Secondary | ICD-10-CM

## 2011-01-23 DIAGNOSIS — K219 Gastro-esophageal reflux disease without esophagitis: Secondary | ICD-10-CM

## 2011-01-23 DIAGNOSIS — D0590 Unspecified type of carcinoma in situ of unspecified breast: Secondary | ICD-10-CM

## 2011-01-23 LAB — CBC WITH DIFFERENTIAL/PLATELET
Basophils Absolute: 0 10*3/uL (ref 0.0–0.1)
EOS%: 2.3 % (ref 0.0–7.0)
Eosinophils Absolute: 0.1 10*3/uL (ref 0.0–0.5)
HCT: 39.4 % (ref 34.8–46.6)
HGB: 13.4 g/dL (ref 11.6–15.9)
MCH: 29.7 pg (ref 25.1–34.0)
MCV: 87.6 fL (ref 79.5–101.0)
MONO%: 9.6 % (ref 0.0–14.0)
NEUT#: 3.8 10*3/uL (ref 1.5–6.5)
NEUT%: 62.4 % (ref 38.4–76.8)
Platelets: 313 10*3/uL (ref 145–400)

## 2011-01-24 ENCOUNTER — Other Ambulatory Visit: Payer: Self-pay | Admitting: Internal Medicine

## 2011-01-28 ENCOUNTER — Ambulatory Visit (HOSPITAL_COMMUNITY): Payer: Medicare Other

## 2011-01-28 ENCOUNTER — Other Ambulatory Visit (INDEPENDENT_AMBULATORY_CARE_PROVIDER_SITE_OTHER): Payer: Self-pay | Admitting: Surgery

## 2011-01-28 ENCOUNTER — Ambulatory Visit (HOSPITAL_COMMUNITY)
Admission: RE | Admit: 2011-01-28 | Discharge: 2011-01-29 | Disposition: A | Discharge status: Home / Self Care | Payer: Medicare Other | Source: Ambulatory Visit | Attending: Surgery | Admitting: Surgery

## 2011-01-28 DIAGNOSIS — Z01818 Encounter for other preprocedural examination: Secondary | ICD-10-CM | POA: Insufficient documentation

## 2011-01-28 DIAGNOSIS — Z01810 Encounter for preprocedural cardiovascular examination: Secondary | ICD-10-CM | POA: Insufficient documentation

## 2011-01-28 DIAGNOSIS — H409 Unspecified glaucoma: Secondary | ICD-10-CM | POA: Insufficient documentation

## 2011-01-28 DIAGNOSIS — M129 Arthropathy, unspecified: Secondary | ICD-10-CM | POA: Insufficient documentation

## 2011-01-28 DIAGNOSIS — Z01812 Encounter for preprocedural laboratory examination: Secondary | ICD-10-CM | POA: Insufficient documentation

## 2011-01-28 DIAGNOSIS — D0590 Unspecified type of carcinoma in situ of unspecified breast: Secondary | ICD-10-CM

## 2011-01-28 DIAGNOSIS — I10 Essential (primary) hypertension: Secondary | ICD-10-CM | POA: Insufficient documentation

## 2011-01-29 LAB — CBC
MCH: 28.7 pg (ref 26.0–34.0)
MCHC: 33.5 g/dL (ref 30.0–36.0)
MCV: 85.6 fL (ref 78.0–100.0)
Platelets: 291 10*3/uL (ref 150–400)

## 2011-01-29 LAB — BASIC METABOLIC PANEL
Calcium: 8.8 mg/dL (ref 8.4–10.5)
Creatinine, Ser: 0.69 mg/dL (ref 0.50–1.10)
GFR calc non Af Amer: >60 mL/min (ref >60)
Glucose, Bld: 112 mg/dL — ABNORMAL HIGH (ref 70–99)
Sodium: 141 mEq/L (ref 135–145)

## 2011-01-30 ENCOUNTER — Encounter (INDEPENDENT_AMBULATORY_CARE_PROVIDER_SITE_OTHER): Payer: Self-pay | Admitting: Surgery

## 2011-02-01 ENCOUNTER — Encounter (INDEPENDENT_AMBULATORY_CARE_PROVIDER_SITE_OTHER): Payer: Self-pay | Admitting: Surgery

## 2011-02-01 ENCOUNTER — Ambulatory Visit (INDEPENDENT_AMBULATORY_CARE_PROVIDER_SITE_OTHER): Payer: Medicare Other | Admitting: Surgery

## 2011-02-01 VITALS — BP 123/76 | HR 73

## 2011-02-01 DIAGNOSIS — D0510 Intraductal carcinoma in situ of unspecified breast: Secondary | ICD-10-CM

## 2011-02-01 DIAGNOSIS — D0590 Unspecified type of carcinoma in situ of unspecified breast: Secondary | ICD-10-CM

## 2011-02-01 NOTE — Progress Notes (Signed)
Status post left mastectomy on 8/27. The patient's pathology report showed only DCIS with clear margins. No sign of invasive cancer. Her drain is still putting out about 50 cc of serosanguineous fluid daily. The skin flaps are viable. The staple line is intact with no sign of drainage. We replaced the dressing and the patient should continue with recording the drain output. We will recheck her in 4 days ago fluor be able to remove the drain and some staples at that time.

## 2011-02-01 NOTE — Op Note (Signed)
  NAME:  [name], [name]              ACCOUNT NO.:  [id number]  MEDICAL RECORD NO.:  [id number]  LOCATION:  5126                         FACILITY:  [facility]  PHYSICIAN:  Wilmon Arms. Corliss Skains, M.D. DATE OF BIRTH:  02/04/1921  DATE OF PROCEDURE:  01/28/2011 DATE OF DISCHARGE:                              OPERATIVE REPORT   PREOPERATIVE DIAGNOSIS:  Recurrent ductal carcinoma in situ, left breast.  POSTOPERATIVE DIAGNOSIS:  Recurrent ductal carcinoma in situ, left breast.  PROCEDURE:  Left simple mastectomy.  SURGEON:  Wilmon Arms. Tsuei, MD  ANESTHESIA:  General.  INDICATIONS:  This is an 77 year old female who is 1-year status post central breast excision for DCIS with no sign of invasive cancer, at that time although margins were clear.  She had a repeat mammogram this year showing some new calcifications around the lumpectomy site. Stereotactic biopsy showed DCIS.  The patient now request mastectomy.  DESCRIPTION OF PROCEDURE:  The patient was brought to the operating room and placed in the supine position on the operating room table.  After an adequate level of general anesthesia was obtained, the patient's left chest was prepped with Betadine and draped in sterile fashion.  A time- out was taken to assure the proper patient and proper procedure.  We outlined an elliptical incision around the left nipple.  We created upper skin flap by incising with a 10 blade using cautery to raise the flap.  We continued up until we were at the upper edge of the breasts below the clavicle.  We dissected medially until we were at the edge of the sternum.  We dissected laterally until we were at the axilla.  We then used a knife to create inferior flap.  Cautery was used to raise skin flaps all the way down to the inframammary crease.  We continued our dissection laterally until I could see the front edge of the latissimus.  We then dissected the breast free from the chest wall taking the pectoralis  fascia with the specimen.  We continued in a medial lateral direction.  We dissected down to the axillary contents and removed the specimen.  We did not enter the axilla.  The wound was then thoroughly irrigated and hemostasis was obtained with cautery.  A 19-Blake drain was placed through the stab incision in the axilla and placed running around the inframammary crease to the upper edge of the mastectomy site.  This was sutured in place with a 3-0 nylon.  The subcutaneous tissues were reapproximated with 3-0 Vicryl.  Staples were used to close the skin. Xeroform and occlusive dressing were applied.  The patient was then extubated and brought to recovery in stable condition.  All sponge, instrument, and needle counts were correct.     Wilmon Arms. Corliss Skains, M.D.     MKT/MEDQ  D:  01/28/2011  T:  01/28/2011  Job:  [id number]  cc:   Lowella Dell, M.D.  Electronically Signed by Manus Rudd M.D. on 02/01/2011 05:04:12 PM

## 2011-02-01 NOTE — Patient Instructions (Signed)
Continue with sponge baths.  Continue to empty drain and record the amount of output.

## 2011-02-05 ENCOUNTER — Ambulatory Visit (INDEPENDENT_AMBULATORY_CARE_PROVIDER_SITE_OTHER): Payer: Medicare Other | Admitting: Surgery

## 2011-02-05 ENCOUNTER — Encounter (INDEPENDENT_AMBULATORY_CARE_PROVIDER_SITE_OTHER): Payer: Self-pay | Admitting: Surgery

## 2011-02-05 VITALS — BP 128/88 | HR 71

## 2011-02-05 DIAGNOSIS — D0510 Intraductal carcinoma in situ of unspecified breast: Secondary | ICD-10-CM

## 2011-02-05 DIAGNOSIS — D0590 Unspecified type of carcinoma in situ of unspecified breast: Secondary | ICD-10-CM

## 2011-02-05 NOTE — Progress Notes (Signed)
The patient comes back for another wound check. Her drainage remains thin and serosanguineous. She only had 20 cc out today. We will remove the drain without any difficulty. The staples were also removed and replaced with Steri-Strips. She is doing quite well. Followup in 4 weeks.

## 2011-02-05 NOTE — Patient Instructions (Signed)
You may shower over the steri-strips.  Keep a band-aid over the drain tube site until it heals completely.

## 2011-02-06 ENCOUNTER — Other Ambulatory Visit: Payer: Self-pay | Admitting: *Deleted

## 2011-02-06 MED ORDER — RANITIDINE HCL 150 MG PO TABS: 150.0000 mg | ORAL_TABLET | Freq: Two times a day (BID) | ORAL | Status: DC

## 2011-02-20 ENCOUNTER — Encounter (INDEPENDENT_AMBULATORY_CARE_PROVIDER_SITE_OTHER): Payer: Self-pay | Admitting: Surgery

## 2011-02-26 ENCOUNTER — Encounter (HOSPITAL_BASED_OUTPATIENT_CLINIC_OR_DEPARTMENT_OTHER): Payer: Medicare Other | Admitting: Oncology

## 2011-02-26 DIAGNOSIS — D0590 Unspecified type of carcinoma in situ of unspecified breast: Secondary | ICD-10-CM

## 2011-02-26 DIAGNOSIS — Z170 Estrogen receptor positive status [ER+]: Secondary | ICD-10-CM

## 2011-03-05 ENCOUNTER — Encounter (INDEPENDENT_AMBULATORY_CARE_PROVIDER_SITE_OTHER): Payer: Self-pay | Admitting: Surgery

## 2011-03-06 ENCOUNTER — Ambulatory Visit (INDEPENDENT_AMBULATORY_CARE_PROVIDER_SITE_OTHER): Payer: Medicare Other | Admitting: Surgery

## 2011-03-06 ENCOUNTER — Encounter (INDEPENDENT_AMBULATORY_CARE_PROVIDER_SITE_OTHER): Payer: Self-pay | Admitting: Surgery

## 2011-03-06 VITALS — BP 132/68 | HR 64 | Temp 96.8°F | Resp 14 | Ht 64.0 in | Wt 172.2 lb

## 2011-03-06 DIAGNOSIS — D0590 Unspecified type of carcinoma in situ of unspecified breast: Secondary | ICD-10-CM

## 2011-03-06 DIAGNOSIS — IMO0002 Reserved for concepts with insufficient information to code with codable children: Secondary | ICD-10-CM

## 2011-03-06 DIAGNOSIS — D0510 Intraductal carcinoma in situ of unspecified breast: Secondary | ICD-10-CM

## 2011-03-06 NOTE — Progress Notes (Signed)
The patient has developed a seroma under the lateral part of her incision.  The incision itself is healing well.  She is going this afternoon to be fitted for a prosthetic.  We prepped with alcohol and anesthetized with 1% lidocaine.  I aspirated 75 cc of serosanguinous fluid with resolution of the seroma.  Follow-up in two weeks.  She may proceed with the fitting this afternoon.

## 2011-03-06 NOTE — Patient Instructions (Signed)
You may proceed with the fitting for the prosthetic.  Remove the dressing tomorrow.  Follow-up in two weeks.

## 2011-03-15 LAB — DIFFERENTIAL
Lymphs Abs: 2.1
Monocytes Relative: 8
Neutro Abs: 5.1
Neutrophils Relative %: 64

## 2011-03-15 LAB — COMPREHENSIVE METABOLIC PANEL
ALT: <8
BUN: 17
Calcium: 9.8
Glucose, Bld: 93
Sodium: 141
Total Protein: 6.9

## 2011-03-15 LAB — CBC
Hemoglobin: 13.5
MCHC: 33.5
Platelets: 372
RDW: 13.7

## 2011-03-18 LAB — COMPREHENSIVE METABOLIC PANEL
ALT: 10
CO2: 29
Calcium: 9.7
Creatinine, Ser: 0.88
GFR calc non Af Amer: >60
Glucose, Bld: 114 — ABNORMAL HIGH

## 2011-03-18 LAB — CBC
Hemoglobin: 13.7
MCHC: 33.3
MCV: 86.9
RBC: 4.74

## 2011-03-18 LAB — DIFFERENTIAL
Basophils Absolute: 0
Eosinophils Absolute: 0
Lymphocytes Relative: 11 — ABNORMAL LOW
Lymphs Abs: 1.4
Neutrophils Relative %: 83 — ABNORMAL HIGH

## 2011-03-18 LAB — POCT URINALYSIS DIP (DEVICE)
Bilirubin Urine: NEGATIVE
Glucose, UA: NEGATIVE
Hgb urine dipstick: NEGATIVE
Specific Gravity, Urine: 1.025

## 2011-03-18 LAB — LIPASE, BLOOD: Lipase: 20

## 2011-03-19 ENCOUNTER — Encounter (INDEPENDENT_AMBULATORY_CARE_PROVIDER_SITE_OTHER): Payer: Self-pay | Admitting: Surgery

## 2011-03-21 ENCOUNTER — Encounter (INDEPENDENT_AMBULATORY_CARE_PROVIDER_SITE_OTHER): Payer: Self-pay | Admitting: Surgery

## 2011-03-21 ENCOUNTER — Other Ambulatory Visit (INDEPENDENT_AMBULATORY_CARE_PROVIDER_SITE_OTHER): Payer: Self-pay | Admitting: Surgery

## 2011-03-21 ENCOUNTER — Ambulatory Visit (INDEPENDENT_AMBULATORY_CARE_PROVIDER_SITE_OTHER): Payer: Medicare Other | Admitting: Surgery

## 2011-03-21 DIAGNOSIS — R1902 Left upper quadrant abdominal swelling, mass and lump: Secondary | ICD-10-CM

## 2011-03-21 DIAGNOSIS — R1901 Right upper quadrant abdominal swelling, mass and lump: Secondary | ICD-10-CM

## 2011-03-21 DIAGNOSIS — D0590 Unspecified type of carcinoma in situ of unspecified breast: Secondary | ICD-10-CM

## 2011-03-21 DIAGNOSIS — D0510 Intraductal carcinoma in situ of unspecified breast: Secondary | ICD-10-CM

## 2011-03-21 NOTE — Progress Notes (Signed)
Her seroma does not seem to have returned.  She has been fitted for a prosthetic.  She has a new complaint of some swelling in the upper abdomen with some moderate discomfort.  Her abdomen is visibly asymmetric with some swelling in the left upper quadrant, but this does not seem to be a ventral hernia.  No distinct mass.  Mildly uncomfortable.  Imp:  Left mastectomy - healing well; no further treatment needed.  Left upper quadrant abdominal swelling/ tenderness - CT scan; return in 2 weeks to discuss CT scan.

## 2011-03-21 NOTE — Patient Instructions (Signed)
We will check a CT scan to evaluate your abdominal swelling.  I would like to see you back after your scan is complete.

## 2011-03-22 LAB — BUN: BUN: 15 mg/dL (ref 6–23)

## 2011-03-25 ENCOUNTER — Ambulatory Visit
Admission: RE | Admit: 2011-03-25 | Discharge: 2011-03-25 | Disposition: A | Discharge status: Home / Self Care | Payer: Medicare Other | Source: Ambulatory Visit | Attending: Surgery | Admitting: Surgery

## 2011-03-25 DIAGNOSIS — R1901 Right upper quadrant abdominal swelling, mass and lump: Secondary | ICD-10-CM

## 2011-03-25 MED ORDER — IOHEXOL 300 MG/ML  SOLN
100.0000 mL | Freq: Once | INTRAMUSCULAR | Status: AC | PRN
  Administered 2011-03-25: 100 mL via INTRAVENOUS

## 2011-04-04 ENCOUNTER — Encounter (INDEPENDENT_AMBULATORY_CARE_PROVIDER_SITE_OTHER): Payer: Self-pay | Admitting: Surgery

## 2011-04-04 ENCOUNTER — Ambulatory Visit (INDEPENDENT_AMBULATORY_CARE_PROVIDER_SITE_OTHER): Payer: Medicare Other | Admitting: Surgery

## 2011-04-04 VITALS — BP 136/68 | HR 64 | Temp 97.2°F | Resp 18 | Ht 65.0 in | Wt 172.1 lb

## 2011-04-04 DIAGNOSIS — D0590 Unspecified type of carcinoma in situ of unspecified breast: Secondary | ICD-10-CM

## 2011-04-04 DIAGNOSIS — R1902 Left upper quadrant abdominal swelling, mass and lump: Secondary | ICD-10-CM

## 2011-04-04 DIAGNOSIS — D0510 Intraductal carcinoma in situ of unspecified breast: Secondary | ICD-10-CM

## 2011-04-04 NOTE — Progress Notes (Signed)
Current note 04/04/11  The patient returns after her CT scan.  This shows some scoliosis and spondylosis of the spine.  She also has some subcutaneous seroma from her mastectomy.  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  All of these factors contribute to the asymmetry in her abdomen.  Otherwise she is feeling well.  Her mastectomy incision is healing well.  The seroma is smaller.  Follow-up 6 months.  Edwyna Dangerfield K.       Previous note -- 03/06/11  Her seroma does not seem to have returned.  She has been fitted for a prosthetic.  She has a new complaint of some swelling in the upper abdomen with some moderate discomfort.  Her abdomen is visibly asymmetric with some swelling in the left upper quadrant, but this does not seem to be a ventral hernia.  No distinct mass.  Mildly uncomfortable.  Imp:  Left mastectomy - healing well; no further treatment needed.  Left upper quadrant abdominal swelling/ tenderness - CT scan; return in 2 weeks to discuss CT scan.

## 2011-04-23 ENCOUNTER — Other Ambulatory Visit: Payer: Self-pay | Admitting: *Deleted

## 2011-04-23 MED ORDER — LISINOPRIL 20 MG PO TABS: 20.0000 mg | ORAL_TABLET | Freq: Every day | ORAL | Status: DC

## 2011-05-15 ENCOUNTER — Telehealth: Payer: Self-pay | Admitting: *Deleted

## 2011-05-15 NOTE — Telephone Encounter (Signed)
Pt c/o feeling like "catching a cold", has sinus congestion w/drainage, mucus white in color x3 days. Pt is concerned since having mastectomy & does not want to get sick. Pt would like to know if she needs OV or Rx to pharmacy.?

## 2011-05-15 NOTE — Telephone Encounter (Signed)
No evidence for bacterial infection. For cold symptoms: vitamiin C 1500 mg daily, may want to take ecchinacea, robitussin DM as needed for cough, hydrate. For fever, purulent drainage, SOB, ear pain will need OV

## 2011-05-15 NOTE — Telephone Encounter (Signed)
Patients daughter informed

## 2011-06-12 ENCOUNTER — Telehealth: Payer: Self-pay | Admitting: *Deleted

## 2011-06-12 NOTE — Telephone Encounter (Signed)
Pt had fall & injured toe.

## 2011-06-14 ENCOUNTER — Encounter: Payer: Self-pay | Admitting: Internal Medicine

## 2011-06-14 ENCOUNTER — Ambulatory Visit (INDEPENDENT_AMBULATORY_CARE_PROVIDER_SITE_OTHER): Payer: Medicare Other | Admitting: Internal Medicine

## 2011-06-14 ENCOUNTER — Telehealth: Payer: Self-pay | Admitting: Internal Medicine

## 2011-06-14 ENCOUNTER — Ambulatory Visit (INDEPENDENT_AMBULATORY_CARE_PROVIDER_SITE_OTHER)
Admission: RE | Admit: 2011-06-14 | Discharge: 2011-06-14 | Disposition: A | Discharge status: Home / Self Care | Payer: Medicare Other | Source: Ambulatory Visit | Attending: Internal Medicine | Admitting: Internal Medicine

## 2011-06-14 VITALS — BP 158/80 | HR 72 | Temp 99.0°F | Resp 16 | Wt 171.0 lb

## 2011-06-14 DIAGNOSIS — M79609 Pain in unspecified limb: Secondary | ICD-10-CM | POA: Diagnosis not present

## 2011-06-14 DIAGNOSIS — W19XXXA Unspecified fall, initial encounter: Secondary | ICD-10-CM | POA: Diagnosis not present

## 2011-06-14 DIAGNOSIS — S20229A Contusion of unspecified back wall of thorax, initial encounter: Secondary | ICD-10-CM | POA: Diagnosis not present

## 2011-06-14 DIAGNOSIS — S91309A Unspecified open wound, unspecified foot, initial encounter: Secondary | ICD-10-CM

## 2011-06-14 DIAGNOSIS — M79671 Pain in right foot: Secondary | ICD-10-CM | POA: Insufficient documentation

## 2011-06-14 DIAGNOSIS — S300XXA Contusion of lower back and pelvis, initial encounter: Secondary | ICD-10-CM

## 2011-06-14 MED ORDER — DOXYCYCLINE HYCLATE 100 MG PO TABS: 100.0000 mg | ORAL_TABLET | Freq: Two times a day (BID) | ORAL | Status: AC

## 2011-06-14 MED ORDER — SILVER SULFADIAZINE 1 % EX CREA: TOPICAL_CREAM | Freq: Every day | CUTANEOUS | Status: DC

## 2011-06-14 MED ORDER — TRAMADOL HCL 50 MG PO TABS: 50.0000 mg | ORAL_TABLET | Freq: Four times a day (QID) | ORAL | Status: AC | PRN

## 2011-06-14 NOTE — Telephone Encounter (Signed)
[name], please, inform patient that xray did not show a fx Thx

## 2011-06-14 NOTE — Assessment & Plan Note (Signed)
R foot and LS spine contusion See Meds

## 2011-06-14 NOTE — Patient Instructions (Signed)
    A Telfa pad should be  changed twice daily. You can take a shower tomorrow.  Keep the wounds clean. You can wash them with liquid soap and water. Pat dry with gauze or a Kleenex tissue  Before applying antibiotic (Silvadene) ointment.   You need to report immediately  if fever, chills or any signs of infection develop.

## 2011-06-16 ENCOUNTER — Encounter: Payer: Self-pay | Admitting: Internal Medicine

## 2011-06-16 DIAGNOSIS — S91309A Unspecified open wound, unspecified foot, initial encounter: Secondary | ICD-10-CM | POA: Insufficient documentation

## 2011-06-16 NOTE — Progress Notes (Signed)
Subjective:    Patient ID: [name], female    DOB: 02/04/1921, 78 y.o.   MRN: [id number]  HPI  C/o fall the other day- tripped and hit her L foot against her dresser, fell; c/o LBP and L foot pain and wound. No LOC. No HA.  Review of Systems  Constitutional: Negative for fever, chills, diaphoresis, activity change, appetite change, fatigue and unexpected weight change.  HENT: Negative for hearing loss, ear pain, nosebleeds, congestion, sore throat, facial swelling, rhinorrhea, sneezing, mouth sores, trouble swallowing, neck pain, neck stiffness, postnasal drip, sinus pressure and tinnitus.   Eyes: Negative for pain, discharge, redness, itching and visual disturbance.  Respiratory: Negative for cough, chest tightness, shortness of breath, wheezing and stridor.   Cardiovascular: Negative for chest pain, palpitations and leg swelling.  Gastrointestinal: Negative for nausea, diarrhea, constipation, blood in stool, abdominal distention, anal bleeding and rectal pain.  Genitourinary: Negative for dysuria, urgency, frequency, hematuria, flank pain, vaginal bleeding, vaginal discharge, difficulty urinating, genital sores and pelvic pain.  Musculoskeletal: Positive for back pain and gait problem. Negative for joint swelling and arthralgias.  Skin: Positive for wound (L foot). Negative for rash.  Neurological: Negative for dizziness, tremors, seizures, syncope, speech difficulty, weakness, numbness and headaches.  Hematological: Negative for adenopathy. Does not bruise/bleed easily.  Psychiatric/Behavioral: Negative for suicidal ideas, behavioral problems, sleep disturbance, dysphoric mood and decreased concentration. The patient is not nervous/anxious.        Objective:   Physical Exam  Constitutional: She appears well-developed. No distress.  HENT:  Head: Normocephalic.  Right Ear: External ear normal.  Left Ear: External ear normal.  Nose: Nose normal.  Mouth/Throat: Oropharynx is  clear and moist.  Eyes: Conjunctivae are normal. Pupils are equal, round, and reactive to light. Right eye exhibits no discharge. Left eye exhibits no discharge.  Neck: Normal range of motion. Neck supple. No JVD present. No tracheal deviation present. No thyromegaly present.  Cardiovascular: Normal rate, regular rhythm and normal heart sounds.   Pulmonary/Chest: No stridor. No respiratory distress. She has no wheezes.  Abdominal: Soft. Bowel sounds are normal. She exhibits no distension and no mass. There is no tenderness. There is no rebound and no guarding.  Musculoskeletal: She exhibits edema (L big toe) and tenderness (L foot).  Lymphadenopathy:    She has no cervical adenopathy.  Neurological: She displays normal reflexes. No cranial nerve deficit. She exhibits normal muscle tone. Coordination normal.  Skin: No rash noted. There is erythema.  Psychiatric: She has a normal mood and affect. Her behavior is normal. Judgment and thought content normal.  Walks w/a walker A very long and thick L big toe nail   Procedure note:  Wound  debridement   Indication : skin wound L big toe   Risks including a need for repeat procedure, scar, infection and others as well as benefits were explained to the patient in detail. Verbal consent was obtained and signed.    The patient was placed in a sitting position. The area of the 3x4 cm wound was prepped with povidone- iodine. Necrotic tissues were debrided with scissors and forceps.   Big toe toenail was trimmed and smoothened    The wound was dressed with silvadine ointment and Telfa pad.  Tolerated well. Complications: None.   Wound instructions provided.   Wound instructions : change dressing once a day or twice a day is needed. Change dressing after  shower in the morning.  Pat dry the wound with gauze. Re-dress wound  with antibiotic ointment and Telfa pad or a Band-Aid of appropriate size.   Please contact us if you notice collection of pus  or  fever and chills, increased pain or redness, increased swelling in the area.        Assessment & Plan:

## 2011-06-16 NOTE — Assessment & Plan Note (Signed)
See meds F/u w/Dr Debby Bud

## 2011-06-16 NOTE — Assessment & Plan Note (Signed)
Wound debrided - see procedure Dressing Silvadine Rx

## 2011-06-17 ENCOUNTER — Telehealth: Payer: Self-pay

## 2011-06-17 NOTE — Telephone Encounter (Signed)
Pt informed

## 2011-06-17 NOTE — Telephone Encounter (Signed)
Rx for telfa pads phoned in. Pt informed

## 2011-06-17 NOTE — Telephone Encounter (Signed)
Pt called stating that she is out of Telfa pad. Pt is requesting either a refill to pharmacy or advisement on if regular gauge is okay to use.

## 2011-06-17 NOTE — Telephone Encounter (Signed)
She will need Telfa 0.5x0.5" (#5), then use gauze after 7 d -- pls call in Thx

## 2011-07-01 ENCOUNTER — Encounter: Payer: Self-pay | Admitting: Internal Medicine

## 2011-07-01 ENCOUNTER — Ambulatory Visit (INDEPENDENT_AMBULATORY_CARE_PROVIDER_SITE_OTHER): Payer: Medicare Other | Admitting: Internal Medicine

## 2011-07-01 VITALS — BP 144/86 | HR 100 | Temp 97.3°F | Resp 16 | Wt 170.5 lb

## 2011-07-01 DIAGNOSIS — S91109A Unspecified open wound of unspecified toe(s) without damage to nail, initial encounter: Secondary | ICD-10-CM

## 2011-07-01 DIAGNOSIS — L609 Nail disorder, unspecified: Secondary | ICD-10-CM | POA: Diagnosis not present

## 2011-07-01 DIAGNOSIS — L608 Other nail disorders: Secondary | ICD-10-CM

## 2011-07-01 NOTE — Progress Notes (Signed)
  Subjective:    Patient ID: [name], female    DOB: 02/04/1921, 78 y.o.   MRN: [id number]  HPI Patient was seen Jan 11th by Dr. Roena Malady for abrasion to the right great toe after a fall. She has been following instructions with wash, antibioitic ointment and tefla bandage. No pain or discomfort. No drainage. Nail is very secure  PMH, FamHx and SocHx reviewed for any changes and relevance.   Review of Systems System review is negative for any constitutional, cardiac, pulmonary, GI or neuro symptoms or complaints other than as described in the HPI.     Objective:   Physical Exam Filed Vitals:   07/01/11 1338  BP: 144/86  Pulse: 100  Temp: 97.3 F (36.3 C)  Resp: 16   Gen'l - very pleasant older AA woman in no distress Derm - healing abrasion right great toe, no drainage, no heat, no pus expressed.       Assessment & Plan:  Laceration right great toe - healing nicely. Nails are hypertrophied and malshaped.  Plan - continue washing wound and using bandaid            Referral to Dr. Harriet Pho for podiatric care.

## 2011-07-15 DIAGNOSIS — L03039 Cellulitis of unspecified toe: Secondary | ICD-10-CM | POA: Diagnosis not present

## 2011-07-15 DIAGNOSIS — M79609 Pain in unspecified limb: Secondary | ICD-10-CM | POA: Diagnosis not present

## 2011-08-12 DIAGNOSIS — H4011X0 Primary open-angle glaucoma, stage unspecified: Secondary | ICD-10-CM | POA: Diagnosis not present

## 2011-08-12 DIAGNOSIS — H409 Unspecified glaucoma: Secondary | ICD-10-CM | POA: Diagnosis not present

## 2011-10-03 ENCOUNTER — Encounter (INDEPENDENT_AMBULATORY_CARE_PROVIDER_SITE_OTHER): Payer: Self-pay | Admitting: Surgery

## 2011-10-03 ENCOUNTER — Ambulatory Visit (INDEPENDENT_AMBULATORY_CARE_PROVIDER_SITE_OTHER): Payer: Medicare Other | Admitting: Surgery

## 2011-10-03 VITALS — BP 174/80 | HR 77 | Temp 97.5°F | Resp 16 | Ht 65.0 in | Wt 172.6 lb

## 2011-10-03 DIAGNOSIS — D0510 Intraductal carcinoma in situ of unspecified breast: Secondary | ICD-10-CM

## 2011-10-03 DIAGNOSIS — D0590 Unspecified type of carcinoma in situ of unspecified breast: Secondary | ICD-10-CM

## 2011-10-03 NOTE — Patient Instructions (Signed)
Right mammogram in July 2013

## 2011-10-03 NOTE — Progress Notes (Signed)
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  She is not using any adjuvant treatment.  She is doing quite well.  No complaints with her prosthesis.  Right breast - no palpable masses; no lymphadenopathy, no nipple retraction or discharge Left chest - healed mastectomy incision; firm scar tissue under the lateral part of the chest wall; no new masses  Impression:  Doing well s/p left mastectomy for recurrent DCIS  Plan:  Right mammogram 7/13 Follow-up 6 months  Wilmon Arms. Corliss Skains, MD, [facility]  10/03/2011 12:10 PM

## 2011-11-11 DIAGNOSIS — H4011X0 Primary open-angle glaucoma, stage unspecified: Secondary | ICD-10-CM | POA: Diagnosis not present

## 2011-11-11 DIAGNOSIS — H409 Unspecified glaucoma: Secondary | ICD-10-CM | POA: Diagnosis not present

## 2011-11-18 ENCOUNTER — Other Ambulatory Visit: Payer: Self-pay | Admitting: *Deleted

## 2011-11-18 MED ORDER — LISINOPRIL 20 MG PO TABS: 20.0000 mg | ORAL_TABLET | Freq: Every day | ORAL | Status: DC

## 2011-11-18 NOTE — Telephone Encounter (Signed)
Rx refill lisinopril sent to [facility]

## 2012-01-17 ENCOUNTER — Telehealth: Payer: Self-pay | Admitting: Oncology

## 2012-01-17 NOTE — Telephone Encounter (Signed)
S/w Dorinda Hill re appts for mammo @ [facility] 9/5 @ 1 pm and appts for lb 10/2 and GM 10/8.

## 2012-02-06 DIAGNOSIS — Z853 Personal history of malignant neoplasm of breast: Secondary | ICD-10-CM | POA: Diagnosis not present

## 2012-02-14 ENCOUNTER — Other Ambulatory Visit: Payer: Self-pay | Admitting: Emergency Medicine

## 2012-02-14 MED ORDER — HYDRALAZINE HCL 50 MG PO TABS: 50.0000 mg | ORAL_TABLET | Freq: Three times a day (TID) | ORAL | Status: DC

## 2012-02-14 MED ORDER — FUROSEMIDE 40 MG PO TABS: 40.0000 mg | ORAL_TABLET | Freq: Every day | ORAL | Status: DC

## 2012-02-17 DIAGNOSIS — H409 Unspecified glaucoma: Secondary | ICD-10-CM | POA: Diagnosis not present

## 2012-02-17 DIAGNOSIS — H4011X0 Primary open-angle glaucoma, stage unspecified: Secondary | ICD-10-CM | POA: Diagnosis not present

## 2012-02-25 ENCOUNTER — Other Ambulatory Visit: Payer: Self-pay | Admitting: Internal Medicine

## 2012-03-04 ENCOUNTER — Other Ambulatory Visit (HOSPITAL_BASED_OUTPATIENT_CLINIC_OR_DEPARTMENT_OTHER): Payer: Medicare Other | Admitting: Lab

## 2012-03-04 DIAGNOSIS — D0590 Unspecified type of carcinoma in situ of unspecified breast: Secondary | ICD-10-CM

## 2012-03-04 DIAGNOSIS — C50919 Malignant neoplasm of unspecified site of unspecified female breast: Secondary | ICD-10-CM | POA: Diagnosis not present

## 2012-03-04 LAB — COMPREHENSIVE METABOLIC PANEL (CC13)
ALT: <6 U/L (ref 0–55)
AST: 14 U/L (ref 5–34)
Albumin: 3.8 g/dL (ref 3.5–5.0)
Alkaline Phosphatase: 92 U/L (ref 40–150)
Potassium: 4.5 mEq/L (ref 3.5–5.1)
Sodium: 141 mEq/L (ref 136–145)
Total Protein: 7.1 g/dL (ref 6.4–8.3)

## 2012-03-04 LAB — CBC WITH DIFFERENTIAL/PLATELET
BASO%: 0.7 % (ref 0.0–2.0)
EOS%: 2.7 % (ref 0.0–7.0)
Eosinophils Absolute: 0.2 10*3/uL (ref 0.0–0.5)
MCH: 29.6 pg (ref 25.1–34.0)
MCHC: 33.2 g/dL (ref 31.5–36.0)
MCV: 88.9 fL (ref 79.5–101.0)
MONO%: 8.8 % (ref 0.0–14.0)
NEUT#: 3.2 10*3/uL (ref 1.5–6.5)
RBC: 4.54 10*6/uL (ref 3.70–5.45)
RDW: 13.6 % (ref 11.2–14.5)

## 2012-03-05 ENCOUNTER — Ambulatory Visit: Payer: Medicare Other | Admitting: Internal Medicine

## 2012-03-09 ENCOUNTER — Encounter: Payer: Self-pay | Admitting: Internal Medicine

## 2012-03-09 ENCOUNTER — Ambulatory Visit (INDEPENDENT_AMBULATORY_CARE_PROVIDER_SITE_OTHER): Payer: Medicare Other | Admitting: Internal Medicine

## 2012-03-09 VITALS — BP 138/78 | HR 76 | Temp 98.4°F | Resp 16 | Wt 175.0 lb

## 2012-03-09 DIAGNOSIS — L84 Corns and callosities: Secondary | ICD-10-CM

## 2012-03-09 DIAGNOSIS — D0590 Unspecified type of carcinoma in situ of unspecified breast: Secondary | ICD-10-CM

## 2012-03-09 DIAGNOSIS — I10 Essential (primary) hypertension: Secondary | ICD-10-CM

## 2012-03-09 DIAGNOSIS — D0510 Intraductal carcinoma in situ of unspecified breast: Secondary | ICD-10-CM

## 2012-03-09 MED ORDER — RANITIDINE HCL 150 MG PO TABS: 150.0000 mg | ORAL_TABLET | Freq: Two times a day (BID) | ORAL | Status: DC

## 2012-03-09 MED ORDER — LISINOPRIL 20 MG PO TABS: 20.0000 mg | ORAL_TABLET | Freq: Every day | ORAL | Status: DC

## 2012-03-09 MED ORDER — FUROSEMIDE 40 MG PO TABS: 40.0000 mg | ORAL_TABLET | Freq: Every day | ORAL | Status: DC

## 2012-03-09 MED ORDER — HYDRALAZINE HCL 50 MG PO TABS: 50.0000 mg | ORAL_TABLET | Freq: Three times a day (TID) | ORAL | Status: DC

## 2012-03-09 NOTE — Progress Notes (Signed)
Subjective:    Patient ID: [name], female    DOB: 02/04/1921, 79 y.o.   MRN: [id number]  HPI Mrs. Antill presents for follow up. She has not been seen for 10 months - last visit jan '13. She has been doing OK.. She did have her toe nails treated by podiatry. She has an appointment coming up with Dr. Margaree Mackintosh and with Dr. Darnelle Catalan. She has just had her mammogram which turned out OK>   She continues to have right shoulder and back and bilateral knee pain. She has been taking low dose tylenol for control of pain.  She has been taking multiple meds for BP.   She has a painful lump on the bottom of the left foot which makes walking very uncomfortable.   Past Medical History  Diagnosis Date  . Hypertension   . Cancer   . Glaucoma   . Allergy   . Arthritis   . Ulcer    Past Surgical History  Procedure Date  . Cholecystectomy   . Breast surgery 2011  . Breast surgery 2012    lt breast masty   No family history on file. History   Social History  . Marital Status: Widowed    Spouse Name: N/A    Number of Children: N/A  . Years of Education: N/A   Occupational History  . Not on file.   Social History Main Topics  . Smoking status: Never Smoker   . Smokeless tobacco: Never Used  . Alcohol Use: No  . Drug Use: No  . Sexually Active:    Other Topics Concern  . Not on file   Social History Narrative  . No narrative on file    Current Outpatient Prescriptions on File Prior to Visit  Medication Sig Dispense Refill  . acetaminophen (TYLENOL) 100 MG/ML solution Take 10 mg/kg by mouth every 4 (four) hours as needed.        . furosemide (LASIX) 40 MG tablet Take 1 tablet (40 mg total) by mouth daily.  30 tablet  12  . hydrALAZINE (APRESOLINE) 50 MG tablet Take 1 tablet (50 mg total) by mouth 3 (three) times daily.  90 tablet  12  . lisinopril (PRINIVIL,ZESTRIL) 20 MG tablet Take 1 tablet (20 mg total) by mouth daily.  30 tablet  11  . Polyethyl Glycol-Propyl Glycol  (SYSTANE ULTRA) 0.4-0.3 % SOLN Apply to eye.        . ranitidine (ZANTAC) 150 MG tablet TAKE ONE TABLET BY MOUTH TWICE DAILY  60 tablet  0  . travoprost, benzalkonium, (TRAVATAN) 0.004 % ophthalmic solution Place 1 drop into both eyes at bedtime.          Review of Systems System review is negative for any constitutional, cardiac, pulmonary, GI or neuro symptoms or complaints other than as described in the HPI.     Objective:   Physical Exam Filed Vitals:   03/09/12 1315  BP: 138/78  Pulse: 76  Temp: 98.4 F (36.9 C)  Resp: 16   Wt Readings from Last 3 Encounters:  03/09/12 175 lb (79.379 kg)  10/03/11 172 lb 9.6 oz (78.291 kg)  07/01/11 170 lb 8 oz (77.338 kg)   Gen'l- elderly AA woman in no distress HEENT- very poor dentition Cor - RRR Pulm - normal respirations. CTAP Derm - callus at base of 1st MTP left sole  After application of betadine, using #15 scalpel trimmed down the callus left sole so that it was slightly concave.  Assessment & Plan:  Callus left foot Plan After trimming of callus she had great relief and absence of pain with weight bearing  Use of pumice stone to keep callus smoothed off.

## 2012-03-09 NOTE — Patient Instructions (Addendum)
[facility] - please consider, in consultation with your family, your wishes for care in the event bad things happen.  Blood pressure - doing OK. All medications refilled  Arthritis - [place], he never leaves. For the pain you may take up to 9 tylenol per day: 2 or 3 every 8 hours as needed. For uncontrolled pain it is ok to take Aleve (naproxen sodium) twice a day.  Breast Cancer - looking good. Keep your appointment with Drs. Tsuie and Magrinat.  Lab Results  Component Value Date   WBC 5.7 03/04/2012   HGB 13.4 03/04/2012   HCT 40.4 03/04/2012   PLT 328 03/04/2012   GLUCOSE 94 03/04/2012   ALT <6 03/04/2012   AST 14 03/04/2012   NA 141 03/04/2012   K 4.5 03/04/2012   CL 105 03/04/2012   CREATININE 0.9 03/04/2012   BUN 18.0 03/04/2012   CO2 26 03/04/2012   All looks good.  Callus on the bottom of left foot. You can keep this smoothed off using a pumice stone.

## 2012-03-10 ENCOUNTER — Encounter: Payer: Self-pay | Admitting: Internal Medicine

## 2012-03-10 ENCOUNTER — Ambulatory Visit (HOSPITAL_BASED_OUTPATIENT_CLINIC_OR_DEPARTMENT_OTHER): Payer: Medicare Other | Admitting: Oncology

## 2012-03-10 ENCOUNTER — Telehealth: Payer: Self-pay | Admitting: Oncology

## 2012-03-10 VITALS — BP 157/83 | HR 83 | Temp 98.5°F | Resp 20 | Ht 65.0 in | Wt 175.1 lb

## 2012-03-10 DIAGNOSIS — D0590 Unspecified type of carcinoma in situ of unspecified breast: Secondary | ICD-10-CM | POA: Diagnosis not present

## 2012-03-10 DIAGNOSIS — Z170 Estrogen receptor positive status [ER+]: Secondary | ICD-10-CM | POA: Diagnosis not present

## 2012-03-10 NOTE — Assessment & Plan Note (Signed)
BP Readings from Last 3 Encounters:  03/09/12 138/78  10/03/11 174/80  07/01/11 144/86   Adequate control on present medications.

## 2012-03-10 NOTE — Progress Notes (Signed)
ID: [name]   DOB: 02/04/1921  MR#: [id number]  CSN#:[id number]  PCP: Illene Regulus, MD GYN:  SU: Manus Rudd MD OTHER MD:   HISTORY OF PRESENT ILLNESS: [name] had her annual screening mammogram at [facility] this year, November 21, 2009.  There was a new 3 mm calcification cluster in the left breast and the patient was recalled for additional views on November 30, 2009.  These magnification views showed the new cluster to be anterior and to include both punctate and linear forms in a branching pattern.  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  With that information, the patient was referred to Dr. Corliss Skains, and bilateral breast MRIs were obtained July 26th.  This showed primarily post biopsy change in the left breast associated with a small amount of enhancement, the entire area measuring up to 1.5 cm with no additional areas of concern.    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 This was read as high-grade.  Margins were negative and the closest was 3 mm. The tumor cells were 100% ER and 83% PR positive.  Her subsequent history is as detailed below  INTERVAL HISTORY: [name] returns today with her granddaughter [name] for followup of [name]'s breast cancer. The interval history is generally unremarkable.  REVIEW OF SYSTEMS: She has glaucoma, under treatment, and her vision has not changed significantly. She has pain in her knees and back at times. She takes Tylenol for this. She is very careful about falls. She's not having any problems with diarrhea or constipation at present.  She has a good appetite and enjoyed her 78th birthday last month. A detailed review of systems is otherwise noncontributory.   PAST MEDICAL HISTORY: Past Medical History  Diagnosis Date  . Hypertension   . Cancer   . Glaucoma   . Allergy   . Arthritis   . Ulcer     PAST SURGICAL HISTORY: Past Surgical History  Procedure Date  . Cholecystectomy   . Breast surgery 2011  . Breast surgery 2012    lt breast masty    FAMILY HISTORY No family history on file. The patient's father left the family after the 11th child was born, so she knows little about him.  The patient's mother died from a heart attack at the age of 59.  Among the patient's 10 siblings, there was no breast or ovarian cancer, although there were 2 "liver cancers" and one lung cancer.    GYNECOLOGIC HISTORY: She is GX P7, first pregnancy to term at age 53.  She underwent her hysterectomy in her forties.  She never took hormones.    SOCIAL HISTORY: She used to work at cleaning homes.  Her husband died approximately 6 years ago and the patient now lives by herself.  In a pinch, she calls her neighbor, [name], although her granddaughter [name], who is my patient [name]' niece) is very involved with her.  [name]'s phone number is [phone].  The patient has a daughter, [name], who lives in [place] and works as a Financial risk analyst and a son, [name], who also is a Financial risk analyst and also lives in [place].  She  has a son, [name], who is a Development worker, international aid in [place].  The patient attends [employer].     ADVANCED DIRECTIVES:  HEALTH MAINTENANCE: History  Substance Use Topics  . Smoking status: Never Smoker   . Smokeless tobacco: Never Used  . Alcohol Use: No     Colonoscopy:  PAP:  Bone density:  Lipid panel:  Allergies  Allergen Reactions  . Codeine     REACTION: causes nausea and vomiting    Current Outpatient Prescriptions  Medication Sig Dispense Refill  . acetaminophen (TYLENOL) 100 MG/ML solution Take  10 mg/kg by mouth every 4 (four) hours as needed.        . furosemide (LASIX) 40 MG tablet Take 1 tablet (40 mg total) by mouth daily.  30 tablet  12  . hydrALAZINE (APRESOLINE) 50 MG tablet Take 1 tablet (50 mg total) by mouth 3 (three) times daily.  90 tablet  12  . lisinopril (PRINIVIL,ZESTRIL) 20 MG tablet Take 1 tablet (20 mg total) by mouth daily.  30 tablet  11  . Polyethyl Glycol-Propyl Glycol (SYSTANE ULTRA) 0.4-0.3 % SOLN Apply to eye.        . ranitidine (ZANTAC) 150 MG tablet Take 1 tablet (150 mg total) by mouth 2 (two) times daily.  60 tablet  11  . travoprost, benzalkonium, (TRAVATAN) 0.004 % ophthalmic solution Place 1 drop into both eyes at bedtime.        OBJECTIVE: Elderly African American woman in no acute distress Filed Vitals:   03/10/12 1546  BP: 157/83  Pulse: 83  Temp: 98.5 F (36.9 C)  Resp: 20     Body mass index is 29.14 kg/(m^2).    ECOG FS: 1  Sclerae unicteric Oropharynx clear No cervical or supraclavicular adenopathy Lungs no rales or rhonchi Heart regular rate and rhythm Abd benign MSK no focal spinal tenderness Neuro: nonfocal Breasts: The right breast is unremarkable. The right axilla is clear. The left breast is status post mastectomy. There is no evidence of local recurrence. The left axilla is benign.  LAB RESULTS: Lab Results  Component Value Date   WBC 5.7 03/04/2012   NEUTROABS 3.2 03/04/2012   HGB 13.4 03/04/2012   HCT 40.4 03/04/2012   MCV 88.9 03/04/2012   PLT 328 03/04/2012      Chemistry      Component Value Date/Time   NA 141 03/04/2012 1301   NA 141 01/29/2011 0500   K 4.5 03/04/2012 1301   K 3.4* 01/29/2011 0500   CL 105 03/04/2012 1301   CL 105 01/29/2011 0500   CO2 26 03/04/2012 1301   CO2 27 01/29/2011 0500   BUN 18.0 03/04/2012 1301   BUN 15 03/21/2011 1130   CREATININE 0.9 03/04/2012 1301   CREATININE 0.89 03/21/2011 1130   CREATININE 0.69 01/29/2011 0500      Component Value Date/Time   CALCIUM 9.5 03/04/2012 1301    CALCIUM 8.8 01/29/2011 0500   ALKPHOS 92 03/04/2012 1301   ALKPHOS 91 01/16/2010 1227   AST 14 03/04/2012 1301   AST 21 01/16/2010 1227   ALT <6 03/04/2012 1301   ALT 11 01/16/2010 1227   BILITOT 0.50 03/04/2012 1301   BILITOT 0.7 01/16/2010 1227       Lab Results  Component Value Date   LABCA2 21 03/04/2012    No components found with this basename: [id number]    No results found for this basename: INR:1;PROTIME:1 in the last 168 hours  Urinalysis  Component Value Date/Time   COLORURINE YELLOW 11/13/2008 0720   APPEARANCEUR CLEAR 11/13/2008 0720   LABSPEC 1.018 11/13/2008 0720   PHURINE 6.0 11/13/2008 0720   GLUCOSEU NEGATIVE 11/13/2008 0720   HGBUR NEGATIVE 11/13/2008 0720   BILIRUBINUR NEGATIVE 11/13/2008 0720   KETONESUR NEGATIVE 11/13/2008 0720   PROTEINUR NEGATIVE 11/13/2008 0720   UROBILINOGEN 1.0 11/13/2008 0720   NITRITE NEGATIVE 11/13/2008 0720   LEUKOCYTESUR MODERATE* 11/13/2008 0720    STUDIES:Right mamogram 02/06/2012 is benign  ASSESSMENT: 78 y.o. [place] woman:  (1) Status post left lumpectomy in August 2011 for ductal carcinoma in situ, high-grade, measuring 1.5 cm.  Negative margins.  ER, PR positive.  The patient decided to forego both adjuvant antiestrogen and adjuvant radiation at that time.  (2) Local recurrence diagnosed in August 2012.  Now status post left simple mastectomy on 01/28/2011 for a 1.6 cm high-grade ductal carcinoma in situ with negative margins, 0/1 lymph node involved, estrogen and progesterone receptor positive.   PLAN: Ms. Skiba is doing quite well for her age. She has an excellent prognosis. She is going to return to see me in one year. She knows to call for any problems that may develop before that. Today we did about avoiding falls, and calling her doctor if she ever develop significant diarrhea, fever, or cough.   MAGRINAT,GUSTAV C    03/10/2012

## 2012-03-10 NOTE — Assessment & Plan Note (Signed)
Status post mastectomy and adjuvant treatment. Currently doing well.   Plan  Keep follow-up appointments with surgeon and oncologist.

## 2012-03-10 NOTE — Telephone Encounter (Signed)
S/w ccs regarding the pt's appt with dr Corliss Skains needs to be cancelled and r/s to April. Pt will be placed on a recall list. gve the pt her oct 2014 appt calendar

## 2012-04-01 ENCOUNTER — Ambulatory Visit (INDEPENDENT_AMBULATORY_CARE_PROVIDER_SITE_OTHER): Payer: Medicare Other | Admitting: Surgery

## 2012-05-18 DIAGNOSIS — H4011X0 Primary open-angle glaucoma, stage unspecified: Secondary | ICD-10-CM | POA: Diagnosis not present

## 2012-05-18 DIAGNOSIS — Z961 Presence of intraocular lens: Secondary | ICD-10-CM | POA: Diagnosis not present

## 2012-05-18 DIAGNOSIS — H409 Unspecified glaucoma: Secondary | ICD-10-CM | POA: Diagnosis not present

## 2012-06-25 ENCOUNTER — Ambulatory Visit (INDEPENDENT_AMBULATORY_CARE_PROVIDER_SITE_OTHER): Payer: Medicare Other | Admitting: Internal Medicine

## 2012-06-25 ENCOUNTER — Encounter: Payer: Self-pay | Admitting: Internal Medicine

## 2012-06-25 VITALS — BP 144/88 | HR 90 | Temp 98.6°F | Resp 10 | Wt 173.1 lb

## 2012-06-25 DIAGNOSIS — M479 Spondylosis, unspecified: Secondary | ICD-10-CM | POA: Diagnosis not present

## 2012-06-25 MED ORDER — MELOXICAM 15 MG PO TABS: 15.0000 mg | ORAL_TABLET | Freq: Every day | ORAL | Status: DC

## 2012-06-25 MED ORDER — RANITIDINE HCL 150 MG PO TABS: 150.0000 mg | ORAL_TABLET | Freq: Two times a day (BID) | ORAL | Status: DC

## 2012-06-25 NOTE — Progress Notes (Signed)
Subjective:    Patient ID: [name], female    DOB: 02/04/1921, 79 y.o.   MRN: [id number]  HPI Since Thanksgiving she has been having pain at the left buttock, across her back and she will have some pain that radiates down the leg. Her discomfort is worse in the AM and it is painful to walk. No paresthesia or weakness. APAP 1,000 mg bid does not seem to help.  Reviewed radiology studies - CT Abd/pelvis in '12 does reveal dextroscoliosis lumbar spine and also DDD lumbar spine.   Past Medical History  Diagnosis Date  . Hypertension   . Cancer   . Glaucoma   . Allergy   . Arthritis   . Ulcer    Past Surgical History  Procedure Date  . Cholecystectomy   . Breast surgery 2011  . Breast surgery 2012    lt breast masty   No family history on file. History   Social History  . Marital Status: Widowed    Spouse Name: N/A    Number of Children: 7  . Years of Education: 8   Occupational History  .     Social History Main Topics  . Smoking status: Never Smoker   . Smokeless tobacco: Never Used  . Alcohol Use: No  . Drug Use: No  . Sexually Active: No   Other Topics Concern  . Not on file   Social History Narrative   8th grade. Married '40 - 52 yrs/widowed. 5 sons, 2 daughters; 6 grandchildren; 12 great grands.Lives alone and manages ADLs except driving. Family is very supportive. ACP -discussed with her and referred to "[employer]." (Oct '13)    Current Outpatient Prescriptions on File Prior to Visit  Medication Sig Dispense Refill  . acetaminophen (TYLENOL) 100 MG/ML solution Take 10 mg/kg by mouth every 4 (four) hours as needed.        . furosemide (LASIX) 40 MG tablet Take 1 tablet (40 mg total) by mouth daily.  30 tablet  12  . hydrALAZINE (APRESOLINE) 50 MG tablet Take 1 tablet (50 mg total) by mouth 3 (three) times daily.  90 tablet  12  . lisinopril (PRINIVIL,ZESTRIL) 20 MG tablet Take 1 tablet (20 mg total) by mouth daily.  30 tablet  11    . Polyethyl Glycol-Propyl Glycol (SYSTANE ULTRA) 0.4-0.3 % SOLN Apply to eye.       . ranitidine (ZANTAC) 150 MG tablet Take 1 tablet (150 mg total) by mouth 2 (two) times daily.  60 tablet  11  . travoprost, benzalkonium, (TRAVATAN) 0.004 % ophthalmic solution Place 1 drop into both eyes at bedtime.          Review of Systems System review is negative for any constitutional, cardiac, pulmonary, GI or neuro symptoms or complaints other than as described in the HPI.     Objective:   Physical Exam Filed Vitals:   06/25/12 1651  BP: 144/88  Pulse: 90  Temp: 98.6 F (37 C)  Resp: 10   Gen'l - elderly AA woman in no acute distress HEENT - poor dentition, C&S clear Cor - 2+ radial pulse, RRR Pulm - normal respirations Back exam: 2+ assist to stand; normal flex to greater than 80  degrees; slow, broad based gait; pain with standing on toes, some pain with heel stand; 2+ assist tostep up to exam table with left leg; normal SLR sitting; normal DTRs at the patellar tendons; no  CVA tenderness; able to move supine  to sitting with  assistance.        Assessment & Plan:

## 2012-06-25 NOTE — Patient Instructions (Addendum)
Pain in the leg and buttock is Sciatica. CT scan in '12 revealed marked arthritic change, scoliosis with curvature of the spine to the right, degenerative disk disease.  Plan Heat will help  Continue Aspercreme  Rx for meloxicam 15 mg ($4 at [facility]) once a day  Tylenol 500 mg three times a day  May want to do some limited stretching exercise.  Sciatica Sciatica is pain, weakness, numbness, or tingling along the path of the sciatic nerve. The nerve starts in the lower back and runs down the back of each leg. The nerve controls the muscles in the lower leg and in the back of the knee, while also providing sensation to the back of the thigh, lower leg, and the sole of your foot. Sciatica is a symptom of another medical condition. For instance, nerve damage or certain conditions, such as a herniated disk or bone spur on the spine, pinch or put pressure on the sciatic nerve. This causes the pain, weakness, or other sensations normally associated with sciatica. Generally, sciatica only affects one side of the body. CAUSES    Herniated or slipped disc.   Degenerative disk disease.   A pain disorder involving the narrow muscle in the buttocks (piriformis syndrome).   Pelvic injury or fracture.   Pregnancy.   Tumor (rare).  SYMPTOMS   Symptoms can vary from mild to very severe. The symptoms usually travel from the low back to the buttocks and down the back of the leg. Symptoms can include:  Mild tingling or dull aches in the lower back, leg, or hip.   Numbness in the back of the calf or sole of the foot.   Burning sensations in the lower back, leg, or hip.   Sharp pains in the lower back, leg, or hip.   Leg weakness.   Severe back pain inhibiting movement.  These symptoms may get worse with coughing, sneezing, laughing, or prolonged sitting or standing. Also, being overweight may worsen symptoms. DIAGNOSIS   Your caregiver will perform a physical exam to look for common symptoms of  sciatica. He or she may ask you to do certain movements or activities that would trigger sciatic nerve pain. Other tests may be performed to find the cause of the sciatica. These may include:  Blood tests.   X-rays.   Imaging tests, such as an MRI or CT scan.  TREATMENT   Treatment is directed at the cause of the sciatic pain. Sometimes, treatment is not necessary and the pain and discomfort goes away on its own. If treatment is needed, your caregiver may suggest:  Over-the-counter medicines to relieve pain.   Prescription medicines, such as anti-inflammatory medicine, muscle relaxants, or narcotics.   Applying heat or ice to the painful area.   Steroid injections to lessen pain, irritation, and inflammation around the nerve.   Reducing activity during periods of pain.   Exercising and stretching to strengthen your abdomen and improve flexibility of your spine. Your caregiver may suggest losing weight if the extra weight makes the back pain worse.   Physical therapy.   Surgery to eliminate what is pressing or pinching the nerve, such as a bone spur or part of a herniated disk.  HOME CARE INSTRUCTIONS    Only take over-the-counter or prescription medicines for pain or discomfort as directed by your caregiver.   Apply ice to the affected area for 20 minutes, 3 4 times a day for the first 48 72 hours. Then try heat in the same  way.   Exercise, stretch, or perform your usual activities if these do not aggravate your pain.   Attend physical therapy sessions as directed by your caregiver.   Keep all follow-up appointments as directed by your caregiver.   Do not wear high heels or shoes that do not provide proper support.   Check your mattress to see if it is too soft. A firm mattress may lessen your pain and discomfort.  SEEK IMMEDIATE MEDICAL CARE IF:    You lose control of your bowel or bladder (incontinence).   You have increasing weakness in the lower back, pelvis, buttocks,  or legs.   You have redness or swelling of your back.   You have a burning sensation when you urinate.   You have pain that gets worse when you lie down or awakens you at night.   Your pain is worse than you have experienced in the past.   Your pain is lasting longer than 4 weeks.   You are suddenly losing weight without reason.  MAKE SURE YOU:  Understand these instructions.   Will watch your condition.   Will get help right away if you are not doing well or get worse.  Document Released: 05/14/2001 Document Revised: 11/19/2011 Document Reviewed: 09/29/2011 [facility] Patient Information 2013 [place], [place].     Sciatica with Rehab The sciatic nerve runs from the back down the leg and is responsible for sensation and control of the muscles in the back (posterior) side of the thigh, lower leg, and foot. Sciatica is a condition that is characterized by inflammation of this nerve.   SYMPTOMS    Signs of nerve damage, including numbness and/or weakness along the posterior side of the lower extremity.   Pain in the back of the thigh that may also travel down the leg.   Pain that worsens when sitting for long periods of time.   Occasionally, pain in the back or buttock.  CAUSES   Inflammation of the sciatic nerve is the cause of sciatica. The inflammation is due to something irritating the nerve. Common sources of irritation include:  Sitting for long periods of time.   Direct trauma to the nerve.   Arthritis of the spine.   Herniated or ruptured disk.   Slipping of the vertebrae (spondylolithesis)   Pressure from soft tissues, such as muscles or ligament-like tissue (fascia).  RISK INCREASES WITH:  Sports that place pressure or stress on the spine (football or weightlifting).   Poor strength and flexibility.   Failure to warm-up properly before activity.   Family history of low back pain or disk disorders.   Previous back injury or surgery.   Poor body  mechanics, especially when lifting, or poor posture.  PREVENTION    Warm up and stretch properly before activity.   Maintain physical fitness:   Strength, flexibility, and endurance.   Cardiovascular fitness.   Learn and use proper technique, especially with posture and lifting. When possible, have coach correct improper technique.   Avoid activities that place stress on the spine.  PROGNOSIS If treated properly, then sciatica usually resolves within 6 weeks. However, occasionally surgery is necessary.   RELATED COMPLICATIONS    Permanent nerve damage, including pain, numbness, tingle, or weakness.   Chronic back pain.   Risks of surgery: infection, bleeding, nerve damage, or damage to surrounding tissues.  TREATMENT Treatment initially involves resting from any activities that aggravate your symptoms. The use of ice and medication may help reduce pain and  inflammation. The use of strengthening and stretching exercises may help reduce pain with activity. These exercises may be performed at home or with referral to a therapist. A therapist may recommend further treatments, such as transcutaneous electronic nerve stimulation (TENS) or ultrasound. Your caregiver may recommend corticosteroid injections to help reduce inflammation of the sciatic nerve. If symptoms persist despite non-surgical (conservative) treatment, then surgery may be recommended. MEDICATION  If pain medication is necessary, then nonsteroidal anti-inflammatory medications, such as aspirin and ibuprofen, or other minor pain relievers, such as acetaminophen, are often recommended.   Do not take pain medication for 7 days before surgery.   Prescription pain relievers may be given if deemed necessary by your caregiver. Use only as directed and only as much as you need.   Ointments applied to the skin may be helpful.   Corticosteroid injections may be given by your caregiver. These injections should be reserved for the  most serious cases, because they may only be given a certain number of times.  HEAT AND COLD  Cold treatment (icing) relieves pain and reduces inflammation. Cold treatment should be applied for 10 to 15 minutes every 2 to 3 hours for inflammation and pain and immediately after any activity that aggravates your symptoms. Use ice packs or massage the area with a piece of ice (ice massage).   Heat treatment may be used prior to performing the stretching and strengthening activities prescribed by your caregiver, physical therapist, or athletic trainer. Use a heat pack or soak the injury in warm water.  SEEK MEDICAL CARE IF:  Treatment seems to offer no benefit, or the condition worsens.   Any medications produce adverse side effects.  EXERCISES   RANGE OF MOTION (ROM) AND STRETCHING EXERCISES - Sciatica Most people with sciatic will find that their symptoms worsen with either excessive bending forward (flexion) or arching at the low back (extension). The exercises which will help resolve your symptoms will focus on the opposite motion. Your physician, physical therapist or athletic trainer will help you determine which exercises will be most helpful to resolve your low back pain. Do not complete any exercises without first consulting with your clinician. Discontinue any exercises which worsen your symptoms until you speak to your clinician. If you have pain, numbness or tingling which travels down into your buttocks, leg or foot, the goal of the therapy is for these symptoms to move closer to your back and eventually resolve. Occasionally, these leg symptoms will get better, but your low back pain may worsen; this is typically an indication of progress in your rehabilitation. Be certain to be very alert to any changes in your symptoms and the activities in which you participated in the 24 hours prior to the change. Sharing this information with your clinician will allow him/her to most efficiently treat  your condition. These exercises may help you when beginning to rehabilitate your injury. Your symptoms may resolve with or without further involvement from your physician, physical therapist or athletic trainer. While completing these exercises, remember:    Restoring tissue flexibility helps normal motion to return to the joints. This allows healthier, less painful movement and activity.   An effective stretch should be held for at least 30 seconds.   A stretch should never be painful. You should only feel a gentle lengthening or release in the stretched tissue.  FLEXION RANGE OF MOTION AND STRETCHING EXERCISES: STRETCH  Flexion, Single Knee to Chest   Lie on a firm bed or floor with  both legs extended in front of you.   Keeping one leg in contact with the floor, bring your opposite knee to your chest. Hold your leg in place by either grabbing behind your thigh or at your knee.   Pull until you feel a gentle stretch in your low back. Hold __________ seconds.   Slowly release your grasp and repeat the exercise with the opposite side.  Repeat __________ times. Complete this exercise __________ times per day.   STRETCH  Flexion, Double Knee to Chest  Lie on a firm bed or floor with both legs extended in front of you.   Keeping one leg in contact with the floor, bring your opposite knee to your chest.   Tense your stomach muscles to support your back and then lift your other knee to your chest. Hold your legs in place by either grabbing behind your thighs or at your knees.   Pull both knees toward your chest until you feel a gentle stretch in your low back. Hold __________ seconds.   Tense your stomach muscles and slowly return one leg at a time to the floor.  Repeat __________ times. Complete this exercise __________ times per day.   STRETCH  Low Trunk Rotation   Lie on a firm bed or floor. Keeping your legs in front of you, bend your knees so they are both pointed toward the ceiling  and your feet are flat on the floor.   Extend your arms out to the side. This will stabilize your upper body by keeping your shoulders in contact with the floor.   Gently and slowly drop both knees together to one side until you feel a gentle stretch in your low back. Hold for __________ seconds.   Tense your stomach muscles to support your low back as you bring your knees back to the starting position. Repeat the exercise to the other side.  Repeat __________ times. Complete this exercise __________ times per day   EXTENSION RANGE OF MOTION AND FLEXIBILITY EXERCISES: STRETCH  Extension, Prone on Elbows  Lie on your stomach on the floor, a bed will be too soft. Place your palms about shoulder width apart and at the height of your head.   Place your elbows under your shoulders. If this is too painful, stack pillows under your chest.   Allow your body to relax so that your hips drop lower and make contact more completely with the floor.   Hold this position for __________ seconds.   Slowly return to lying flat on the floor.  Repeat __________ times. Complete this exercise __________ times per day.   RANGE OF MOTION  Extension, Prone Press Ups  Lie on your stomach on the floor, a bed will be too soft. Place your palms about shoulder width apart and at the height of your head.   Keeping your back as relaxed as possible, slowly straighten your elbows while keeping your hips on the floor. You may adjust the placement of your hands to maximize your comfort. As you gain motion, your hands will come more underneath your shoulders.   Hold this position __________ seconds.   Slowly return to lying flat on the floor.  Repeat __________ times. Complete this exercise __________ times per day.   STRENGTHENING EXERCISES - Sciatica  These exercises may help you when beginning to rehabilitate your injury. These exercises should be done near your "sweet spot." This is the neutral, low-back arch,  somewhere between fully rounded and fully arched, that  is your least painful position. When performed in this safe range of motion, these exercises can be used for people who have either a flexion or extension based injury. These exercises may resolve your symptoms with or without further involvement from your physician, physical therapist or athletic trainer. While completing these exercises, remember:    Muscles can gain both the endurance and the strength needed for everyday activities through controlled exercises.   Complete these exercises as instructed by your physician, physical therapist or athletic trainer. Progress with the resistance and repetition exercises only as your caregiver advises.   You may experience muscle soreness or fatigue, but the pain or discomfort you are trying to eliminate should never worsen during these exercises. If this pain does worsen, stop and make certain you are following the directions exactly. If the pain is still present after adjustments, discontinue the exercise until you can discuss the trouble with your clinician.  STRENGTHENING Deep Abdominals, Pelvic Tilt   Lie on a firm bed or floor. Keeping your legs in front of you, bend your knees so they are both pointed toward the ceiling and your feet are flat on the floor.   Tense your lower abdominal muscles to press your low back into the floor. This motion will rotate your pelvis so that your tail bone is scooping upwards rather than pointing at your feet or into the floor.   With a gentle tension and even breathing, hold this position for __________ seconds.  Repeat __________ times. Complete this exercise __________ times per day.   STRENGTHENING  Abdominals, Crunches   Lie on a firm bed or floor. Keeping your legs in front of you, bend your knees so they are both pointed toward the ceiling and your feet are flat on the floor. Cross your arms over your chest.   Slightly tip your chin down without bending  your neck.   Tense your abdominals and slowly lift your trunk high enough to just clear your shoulder blades. Lifting higher can put excessive stress on the low back and does not further strengthen your abdominal muscles.   Control your return to the starting position.  Repeat __________ times. Complete this exercise __________ times per day.   STRENGTHENING  Quadruped, Opposite UE/LE Lift  Assume a hands and knees position on a firm surface. Keep your hands under your shoulders and your knees under your hips. You may place padding under your knees for comfort.   Find your neutral spine and gently tense your abdominal muscles so that you can maintain this position. Your shoulders and hips should form a rectangle that is parallel with the floor and is not twisted.   Keeping your trunk steady, lift your right hand no higher than your shoulder and then your left leg no higher than your hip. Make sure you are not holding your breath. Hold this position __________ seconds.   Continuing to keep your abdominal muscles tense and your back steady, slowly return to your starting position. Repeat with the opposite arm and leg.  Repeat __________ times. Complete this exercise __________ times per day.   STRENGTHENING  Abdominals and Quadriceps, Straight Leg Raise   Lie on a firm bed or floor with both legs extended in front of you.   Keeping one leg in contact with the floor, bend the other knee so that your foot can rest flat on the floor.   Find your neutral spine, and tense your abdominal muscles to maintain your spinal position throughout  the exercise.   Slowly lift your straight leg off the floor about 6 inches for a count of 15, making sure to not hold your breath.   Still keeping your neutral spine, slowly lower your leg all the way to the floor.  Repeat this exercise with each leg __________ times. Complete this exercise __________ times per day. POSTURE AND BODY MECHANICS CONSIDERATIONS -  Sciatica Keeping correct posture when sitting, standing or completing your activities will reduce the stress put on different body tissues, allowing injured tissues a chance to heal and limiting painful experiences. The following are general guidelines for improved posture. Your physician or physical therapist will provide you with any instructions specific to your needs. While reading these guidelines, remember:  The exercises prescribed by your provider will help you have the flexibility and strength to maintain correct postures.   The correct posture provides the optimal environment for your joints to work. All of your joints have less wear and tear when properly supported by a spine with good posture. This means you will experience a healthier, less painful body.   Correct posture must be practiced with all of your activities, especially prolonged sitting and standing. Correct posture is as important when doing repetitive low-stress activities (typing) as it is when doing a single heavy-load activity (lifting).  RESTING POSITIONS Consider which positions are most painful for you when choosing a resting position. If you have pain with flexion-based activities (sitting, bending, stooping, squatting), choose a position that allows you to rest in a less flexed posture. You would want to avoid curling into a fetal position on your side. If your pain worsens with extension-based activities (prolonged standing, working overhead), avoid resting in an extended position such as sleeping on your stomach. Most people will find more comfort when they rest with their spine in a more neutral position, neither too rounded nor too arched. Lying on a non-sagging bed on your side with a pillow between your knees, or on your back with a pillow under your knees will often provide some relief. Keep in mind, being in any one position for a prolonged period of time, no matter how correct your posture, can still lead to  stiffness. PROPER SITTING POSTURE In order to minimize stress and discomfort on your spine, you must sit with correct posture Sitting with good posture should be effortless for a healthy body. Returning to good posture is a gradual process. Many people can work toward this most comfortably by using various supports until they have the flexibility and strength to maintain this posture on their own. When sitting with proper posture, your ears will fall over your shoulders and your shoulders will fall over your hips. You should use the back of the chair to support your upper back. Your low back will be in a neutral position, just slightly arched. You may place a small pillow or folded towel at the base of your low back for support.   When working at a desk, create an environment that supports good, upright posture. Without extra support, muscles fatigue and lead to excessive strain on joints and other tissues. Keep these recommendations in mind: CHAIR:   A chair should be able to slide under your desk when your back makes contact with the back of the chair. This allows you to work closely.   The chair's height should allow your eyes to be level with the upper part of your monitor and your hands to be slightly lower than your elbows.  BODY POSITION  Your feet should make contact with the floor. If this is not possible, use a foot rest.   Keep your ears over your shoulders. This will reduce stress on your neck and low back.  INCORRECT SITTING POSTURES   If you are feeling tired and unable to assume a healthy sitting posture, do not slouch or slump. This puts excessive strain on your back tissues, causing more damage and pain. Healthier options include:   Using more support, like a lumbar pillow.   Switching tasks to something that requires you to be upright or walking.   Talking a brief walk.   Lying down to rest in a neutral-spine position.  PROLONGED STANDING WHILE SLIGHTLY LEANING FORWARD    When completing a task that requires you to lean forward while standing in one place for a long time, place either foot up on a stationary 2-4 inch high object to help maintain the best posture. When both feet are on the ground, the low back tends to lose its slight inward curve. If this curve flattens (or becomes too large), then the back and your other joints will experience too much stress, fatigue more quickly and can cause pain.   CORRECT STANDING POSTURES Proper standing posture should be assumed with all daily activities, even if they only take a few moments, like when brushing your teeth. As in sitting, your ears should fall over your shoulders and your shoulders should fall over your hips. You should keep a slight tension in your abdominal muscles to brace your spine. Your tailbone should point down to the ground, not behind your body, resulting in an over-extended swayback posture.   INCORRECT STANDING POSTURES  Common incorrect standing postures include a forward head, locked knees and/or an excessive swayback. WALKING Walk with an upright posture. Your ears, shoulders and hips should all line-up. PROLONGED ACTIVITY IN A FLEXED POSITION When completing a task that requires you to bend forward at your waist or lean over a low surface, try to find a way to stabilize 3 of 4 of your limbs. You can place a hand or elbow on your thigh or rest a knee on the surface you are reaching across. This will provide you more stability so that your muscles do not fatigue as quickly. By keeping your knees relaxed, or slightly bent, you will also reduce stress across your low back. CORRECT LIFTING TECHNIQUES DO :   Assume a wide stance. This will provide you more stability and the opportunity to get as close as possible to the object which you are lifting.   Tense your abdominals to brace your spine; then bend at the knees and hips. Keeping your back locked in a neutral-spine position, lift using your leg  muscles. Lift with your legs, keeping your back straight.   Test the weight of unknown objects before attempting to lift them.   Try to keep your elbows locked down at your sides in order get the best strength from your shoulders when carrying an object.   Always ask for help when lifting heavy or awkward objects.  INCORRECT LIFTING TECHNIQUES DO NOT:   Lock your knees when lifting, even if it is a small object.   Bend and twist. Pivot at your feet or move your feet when needing to change directions.   Assume that you cannot safely pick up a paperclip without proper posture.  Document Released: 05/20/2005 Document Revised: 08/12/2011 Document Reviewed: 09/01/2008 [facility] Patient Information 2013 [place], [place].

## 2012-06-28 NOTE — Assessment & Plan Note (Signed)
Pain in the leg and buttock is Sciatica. CT scan in '12 revealed marked arthritic change, scoliosis with curvature of the spine to the right, degenerative disk disease.  Plan Heat will help  Continue Aspercreme  Rx for meloxicam 15 mg ($4 at [facility]) once a day  Tylenol 500 mg three times a day  May want to do some limited stretching exercise.

## 2012-06-30 ENCOUNTER — Telehealth: Payer: Self-pay | Admitting: Internal Medicine

## 2012-06-30 NOTE — Telephone Encounter (Signed)
Called to ask if OK to use over the counter Salonpas pain patch.  Medication precautions specify to check with MD if history of hypertension.  Seen in office 06/25/12 for chronic back pain since Thanksgiving 2013.  Please call to advise if this medication is safe to use with hypertension and, if not, what else would MD recommend.

## 2012-07-01 NOTE — Telephone Encounter (Signed)
I don't know what a Salonpas patch contains but it is probably ok

## 2012-07-01 NOTE — Telephone Encounter (Signed)
Notified pt with md response.../lmb 

## 2012-08-12 ENCOUNTER — Encounter (INDEPENDENT_AMBULATORY_CARE_PROVIDER_SITE_OTHER): Payer: Self-pay | Admitting: Surgery

## 2012-08-17 DIAGNOSIS — H409 Unspecified glaucoma: Secondary | ICD-10-CM | POA: Diagnosis not present

## 2012-08-17 DIAGNOSIS — H4011X0 Primary open-angle glaucoma, stage unspecified: Secondary | ICD-10-CM | POA: Diagnosis not present

## 2012-09-25 ENCOUNTER — Ambulatory Visit (INDEPENDENT_AMBULATORY_CARE_PROVIDER_SITE_OTHER): Payer: Medicare Other | Admitting: Surgery

## 2012-10-02 ENCOUNTER — Ambulatory Visit (INDEPENDENT_AMBULATORY_CARE_PROVIDER_SITE_OTHER): Payer: Medicare Other | Admitting: Surgery

## 2012-10-02 ENCOUNTER — Encounter (INDEPENDENT_AMBULATORY_CARE_PROVIDER_SITE_OTHER): Payer: Self-pay | Admitting: Surgery

## 2012-10-02 VITALS — BP 132/78 | HR 60 | Temp 98.0°F | Resp 18 | Ht 67.0 in | Wt 170.0 lb

## 2012-10-02 DIAGNOSIS — D0590 Unspecified type of carcinoma in situ of unspecified breast: Secondary | ICD-10-CM | POA: Diagnosis not present

## 2012-10-02 DIAGNOSIS — D0512 Intraductal carcinoma in situ of left breast: Secondary | ICD-10-CM

## 2012-10-02 NOTE — Progress Notes (Signed)
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 She is not using any adjuvant treatment. She is doing quite well. No complaints with her prosthesis.   Filed Vitals:   10/02/12 0945  BP: 132/78  Pulse: 60  Temp: 98 F (36.7 C)  Resp: 18    Right breast - no palpable masses; no lymphadenopathy, no nipple retraction or discharge  Left chest - healed mastectomy incision; firm scar tissue under the lateral part of the chest wall; no new masses   Impression: Doing well s/p left mastectomy for recurrent DCIS  Plan: Right mammogram 7/14  Follow-up 9 months, which will get [place] six months apart from Dr. Darrall Dears annual appointment.  Wilmon Arms. Corliss Skains, MD, [facility]  10/02/2012 10:41 AM

## 2012-11-16 DIAGNOSIS — H409 Unspecified glaucoma: Secondary | ICD-10-CM | POA: Diagnosis not present

## 2012-11-16 DIAGNOSIS — H4011X0 Primary open-angle glaucoma, stage unspecified: Secondary | ICD-10-CM | POA: Diagnosis not present

## 2012-12-18 ENCOUNTER — Telehealth: Payer: Self-pay

## 2012-12-18 NOTE — Telephone Encounter (Signed)
Phone call from Emojean Gertz [phone] stating patient would like you to call her for you to give verbal confirmation that she in fact is to be taking the anti inflammatory medication. She does not think she needs to be taking it and won't take it until you confirm with her that she should be. Also Dorinda Hill states you suggested patient could use a different cream instead of Aspacream, but can not remember the name and I do not see it in her chart. Please call patient when you can. Thank you.

## 2012-12-18 NOTE — Telephone Encounter (Signed)
[name] notified

## 2012-12-18 NOTE — Telephone Encounter (Signed)
Phone call from Nuri Branca [phone]. She states patient was started on an anti inflammatory medication (does not know the name) and was to call if there are any changes in her bowel and there have been. Her stool is now yellow and slightly loose. Please advise. Thanks.

## 2012-12-18 NOTE — Telephone Encounter (Signed)
Yellow loose stools are not a worrisome sign for bleeding or other problems associated with anti-inflammatory drugs

## 2012-12-19 NOTE — Telephone Encounter (Signed)
Record reviewed - patient to take meloxicam daily for sciatica type pain.  Aspercreme is a good lineament but any will do.

## 2012-12-21 NOTE — Telephone Encounter (Signed)
Magdalene Patricia notified and she will let patient know. Patient is asking if she can take Tylenol as well? Please advise. Thanks

## 2012-12-21 NOTE — Telephone Encounter (Signed)
Yes, may also take APAP 500 - 1,000 mg tid.

## 2012-12-21 NOTE — Telephone Encounter (Signed)
Left message to call back  

## 2012-12-21 NOTE — Telephone Encounter (Signed)
Line busy x 2 tries

## 2012-12-22 NOTE — Telephone Encounter (Signed)
[name] notified that okay for Tylenol 500-1,000 mg tid

## 2013-01-24 ENCOUNTER — Inpatient Hospital Stay (HOSPITAL_COMMUNITY)
Admission: EM | Admit: 2013-01-24 | Discharge: 2013-01-26 | DRG: 379 | Disposition: A | Discharge status: Home / Self Care | Payer: Medicare Other | Attending: Internal Medicine | Admitting: Internal Medicine

## 2013-01-24 ENCOUNTER — Encounter (HOSPITAL_COMMUNITY): Payer: Self-pay | Admitting: Emergency Medicine

## 2013-01-24 DIAGNOSIS — E876 Hypokalemia: Secondary | ICD-10-CM | POA: Diagnosis present

## 2013-01-24 DIAGNOSIS — Z79899 Other long term (current) drug therapy: Secondary | ICD-10-CM | POA: Diagnosis not present

## 2013-01-24 DIAGNOSIS — T3995XA Adverse effect of unspecified nonopioid analgesic, antipyretic and antirheumatic, initial encounter: Secondary | ICD-10-CM | POA: Diagnosis present

## 2013-01-24 DIAGNOSIS — Z9010 Acquired absence of unspecified breast and nipple: Secondary | ICD-10-CM

## 2013-01-24 DIAGNOSIS — K2940 Chronic atrophic gastritis without bleeding: Secondary | ICD-10-CM | POA: Diagnosis not present

## 2013-01-24 DIAGNOSIS — H409 Unspecified glaucoma: Secondary | ICD-10-CM | POA: Diagnosis present

## 2013-01-24 DIAGNOSIS — K2961 Other gastritis with bleeding: Secondary | ICD-10-CM | POA: Diagnosis present

## 2013-01-24 DIAGNOSIS — M129 Arthropathy, unspecified: Secondary | ICD-10-CM | POA: Diagnosis present

## 2013-01-24 DIAGNOSIS — Z8711 Personal history of peptic ulcer disease: Secondary | ICD-10-CM

## 2013-01-24 DIAGNOSIS — K921 Melena: Secondary | ICD-10-CM | POA: Diagnosis not present

## 2013-01-24 DIAGNOSIS — Z853 Personal history of malignant neoplasm of breast: Secondary | ICD-10-CM

## 2013-01-24 DIAGNOSIS — I10 Essential (primary) hypertension: Secondary | ICD-10-CM | POA: Diagnosis present

## 2013-01-24 DIAGNOSIS — K922 Gastrointestinal hemorrhage, unspecified: Secondary | ICD-10-CM

## 2013-01-24 DIAGNOSIS — M479 Spondylosis, unspecified: Secondary | ICD-10-CM

## 2013-01-24 HISTORY — DX: Adverse effect of unspecified anesthetic, initial encounter: T41.45XA

## 2013-01-24 HISTORY — DX: Other complications of anesthesia, initial encounter: T88.59XA

## 2013-01-24 LAB — COMPREHENSIVE METABOLIC PANEL
ALT: 8 U/L (ref 0–35)
Alkaline Phosphatase: 82 U/L (ref 39–117)
CO2: 29 mEq/L (ref 19–32)
Chloride: 104 mEq/L (ref 96–112)
GFR calc Af Amer: 68 mL/min — ABNORMAL LOW (ref >90)
GFR calc non Af Amer: 59 mL/min — ABNORMAL LOW (ref >90)
Glucose, Bld: 104 mg/dL — ABNORMAL HIGH (ref 70–99)
Potassium: 4 mEq/L (ref 3.5–5.1)
Sodium: 140 mEq/L (ref 135–145)
Total Bilirubin: 0.7 mg/dL (ref 0.3–1.2)
Total Protein: 7.1 g/dL (ref 6.0–8.3)

## 2013-01-24 LAB — CBC
HCT: 36.8 % (ref 36.0–46.0)
Hemoglobin: 12.6 g/dL (ref 12.0–15.0)
Hemoglobin: 12.6 g/dL (ref 12.0–15.0)
MCH: 29.2 pg (ref 26.0–34.0)
MCHC: 34.2 g/dL (ref 30.0–36.0)
MCV: 85.4 fL (ref 78.0–100.0)
RBC: 4.22 MIL/uL (ref 3.87–5.11)
RBC: 4.31 MIL/uL (ref 3.87–5.11)
WBC: 6.1 10*3/uL (ref 4.0–10.5)

## 2013-01-24 LAB — OCCULT BLOOD, POC DEVICE: Fecal Occult Bld: POSITIVE — AB

## 2013-01-24 MED ORDER — ONDANSETRON HCL 4 MG/2ML IJ SOLN: 4.0000 mg | Freq: Four times a day (QID) | INTRAMUSCULAR | Status: DC | PRN

## 2013-01-24 MED ORDER — ALBUTEROL SULFATE (5 MG/ML) 0.5% IN NEBU: 2.5000 mg | INHALATION_SOLUTION | RESPIRATORY_TRACT | Status: DC | PRN

## 2013-01-24 MED ORDER — SODIUM CHLORIDE 0.9 % IV SOLN
INTRAVENOUS | Status: AC
  Administered 2013-01-24: 17:00:00 via INTRAVENOUS

## 2013-01-24 MED ORDER — SODIUM CHLORIDE 0.9 % IV SOLN: INTRAVENOUS | Status: DC

## 2013-01-24 MED ORDER — LISINOPRIL 20 MG PO TABS
20.0000 mg | ORAL_TABLET | Freq: Every day | ORAL | Status: DC
  Administered 2013-01-25 – 2013-01-26 (×2): 20 mg via ORAL
  Filled 2013-01-24 (×2): qty 1

## 2013-01-24 MED ORDER — MUPIROCIN 2 % EX OINT
1.0000 "application " | TOPICAL_OINTMENT | Freq: Two times a day (BID) | CUTANEOUS | Status: DC
  Administered 2013-01-24 – 2013-01-26 (×4): 1 via NASAL
  Filled 2013-01-24 (×2): qty 22

## 2013-01-24 MED ORDER — SODIUM CHLORIDE 0.9 % IJ SOLN
3.0000 mL | Freq: Two times a day (BID) | INTRAMUSCULAR | Status: DC
  Administered 2013-01-24 – 2013-01-26 (×5): 3 mL via INTRAVENOUS

## 2013-01-24 MED ORDER — PANTOPRAZOLE SODIUM 40 MG IV SOLR
40.0000 mg | Freq: Two times a day (BID) | INTRAVENOUS | Status: DC
  Administered 2013-01-24 – 2013-01-25 (×2): 40 mg via INTRAVENOUS
  Filled 2013-01-24 (×3): qty 40

## 2013-01-24 MED ORDER — FUROSEMIDE 40 MG PO TABS
40.0000 mg | ORAL_TABLET | Freq: Every day | ORAL | Status: DC
  Administered 2013-01-24 – 2013-01-26 (×3): 40 mg via ORAL
  Filled 2013-01-24 (×3): qty 1

## 2013-01-24 MED ORDER — TRAVOPROST (BAK FREE) 0.004 % OP SOLN
1.0000 [drp] | Freq: Every day | OPHTHALMIC | Status: DC
  Administered 2013-01-24 – 2013-01-25 (×2): 1 [drp] via OPHTHALMIC
  Filled 2013-01-24 (×2): qty 2.5

## 2013-01-24 MED ORDER — ACETAMINOPHEN 325 MG PO TABS: 325.0000 mg | ORAL_TABLET | Freq: Four times a day (QID) | ORAL | Status: DC | PRN

## 2013-01-24 MED ORDER — ONDANSETRON HCL 4 MG PO TABS: 4.0000 mg | ORAL_TABLET | Freq: Four times a day (QID) | ORAL | Status: DC | PRN

## 2013-01-24 MED ORDER — CHLORHEXIDINE GLUCONATE CLOTH 2 % EX PADS
6.0000 | MEDICATED_PAD | Freq: Every day | CUTANEOUS | Status: DC
  Administered 2013-01-25 – 2013-01-26 (×2): 6 via TOPICAL

## 2013-01-24 MED ORDER — PANTOPRAZOLE SODIUM 40 MG IV SOLR
40.0000 mg | Freq: Once | INTRAVENOUS | Status: AC
  Administered 2013-01-24: 40 mg via INTRAVENOUS
  Filled 2013-01-24: qty 40

## 2013-01-24 MED ORDER — HYDRALAZINE HCL 50 MG PO TABS
50.0000 mg | ORAL_TABLET | Freq: Three times a day (TID) | ORAL | Status: DC
  Administered 2013-01-24 – 2013-01-26 (×6): 50 mg via ORAL
  Filled 2013-01-24 (×8): qty 1

## 2013-01-24 NOTE — H&P (Addendum)
[facility] History and Physical  [name] WJX:[id number] DOB: 02/04/1921 DOA: 01/24/2013  Referring physician: EDP PCP: Illene Regulus, MD  Specialists:  None  Chief Complaint: Black stools  HPI: [name] is a 79 y.o. female with history of breast cancer status post left mastectomy, arthritis on meloxicam, gastric ulcer, hypertension, presented to the ED with 2 episodes of black tarry stools since last night. She had one episode of black stools last night with associated transient lower abdominal discomfort. She denies dizziness, lightheadedness, chest pain or dyspnea. No nausea, vomiting or epigastric pain. She noticed another similar black BM this morning. She denies similar episodes in the past. In the ED, hemoglobin 12.6 and hemodynamically stable. Hospitalist admission requested. GI has been consulted by EDP   Review of Systems: All systems reviewed and apart from history of presenting illness, are negative  Past Medical History  Diagnosis Date  . Hypertension   . Cancer   . Glaucoma   . Allergy   . Arthritis   . Ulcer    Past Surgical History  Procedure Laterality Date  . Cholecystectomy    . Breast surgery  2011  . Breast surgery  2012    lt breast masty  . Abdominal hysterectomy     Social History:  reports that she has never smoked. She has never used smokeless tobacco. She reports that she does not drink alcohol or use illicit drugs. Widowed. Lives alone and is independent of activities of daily living.  Allergies  Allergen Reactions  . Codeine     REACTION: causes nausea and vomiting    History reviewed. No pertinent family history. negative history.  Prior to Admission medications   Medication Sig Start Date End Date Taking? Authorizing Provider  acetaminophen (TYLENOL) 325 MG tablet Take 325 mg by mouth every 6 (six) hours as needed for pain.   Yes Historical Provider, MD  furosemide (LASIX) 40 MG tablet Take 1 tablet (40 mg total) by  mouth daily. 03/09/12  Yes Jacques Navy, MD  hydrALAZINE (APRESOLINE) 50 MG tablet Take 1 tablet (50 mg total) by mouth 3 (three) times daily. 03/09/12  Yes Jacques Navy, MD  lisinopril (PRINIVIL,ZESTRIL) 20 MG tablet Take 1 tablet (20 mg total) by mouth daily. 03/09/12  Yes Jacques Navy, MD  meloxicam (MOBIC) 15 MG tablet Take 1 tablet (15 mg total) by mouth daily. 06/25/12  Yes Jacques Navy, MD  Polyethyl Glycol-Propyl Glycol (SYSTANE ULTRA) 0.4-0.3 % SOLN Apply to eye.    Yes Historical Provider, MD  travoprost, benzalkonium, (TRAVATAN) 0.004 % ophthalmic solution Place 1 drop into both eyes at bedtime.   Yes Historical Provider, MD   Physical Exam: Filed Vitals:   01/24/13 1200 01/24/13 1300 01/24/13 1400 01/24/13 1501  BP: 157/70 155/68 164/64 159/95  Pulse: 66 69 73 74  Temp:    98.8 F (37.1 C)  TempSrc:    Oral  Resp: 22 22 21 20   Height:      Weight:      SpO2: 97% 98% 99% 98%     General exam: Moderately built and nourished elderly female patient, lying comfortably supine on the gurney in no obvious distress.  Head, eyes and ENT: Nontraumatic and normocephalic. Pupils equally reacting to light and accommodation. Oral mucosa moist.  Neck: Supple. No JVD, carotid bruit or thyromegaly.  Lymphatics: No lymphadenopathy.  Respiratory system: Clear to auscultation. No increased work of breathing.  Cardiovascular system: S1 and S2 heard, RRR. No  JVD, murmurs, gallops, clicks or pedal edema.  Gastrointestinal system: Abdomen is nondistended, soft and nontender. Normal bowel sounds heard. No organomegaly or masses appreciated.  Central nervous system: Alert and oriented. No focal neurological deficits.  Extremities: Symmetric 5 x 5 power. Peripheral pulses symmetrically felt.  Skin: No rashes or acute findings.  Musculoskeletal system: Negative exam.  Psychiatry: Pleasant and cooperative.   Labs on Admission:  Basic Metabolic Panel:  Recent Labs Lab  01/24/13 1053  NA 140  K 4.0  CL 104  CO2 29  GLUCOSE 104*  BUN 20  CREATININE 0.85  CALCIUM 9.7   Liver Function Tests:  Recent Labs Lab 01/24/13 1053  AST 16  ALT 8  ALKPHOS 82  BILITOT 0.7  PROT 7.1  ALBUMIN 3.6   No results found for this basename: LIPASE, AMYLASE,  in the last 168 hours No results found for this basename: AMMONIA,  in the last 168 hours CBC:  Recent Labs Lab 01/24/13 1053  WBC 6.1  HGB 12.6  HCT 36.1  MCV 85.5  PLT 315   Cardiac Enzymes: No results found for this basename: CKTOTAL, CKMB, CKMBINDEX, TROPONINI,  in the last 168 hours  BNP (last 3 results) No results found for this basename: PROBNP,  in the last 8760 hours CBG: No results found for this basename: GLUCAP,  in the last 168 hours  Radiological Exams on Admission: No results found.  EKG: Independently reviewed. Sinus rhythm with first degree AV block,? RBBB, LAFB and no acute changes.  Assessment/Plan Principal Problem:   GI bleed due to NSAIDs Active Problems:   HYPERTENSION   1. Black stools/?Upper GI bleeding: FOBT +. Patient lacks upper GI symptoms & BUN normal.  DD-NSAID-induced gastritis or ulcer, PUD, esophagitis or other etiology. Admit to telemetry. Hold meloxicam. Gentle IV fluids. IV PPI. N.p.o. after midnight for possible EGD in a.m.  GI consulted. Monitor CBC closely. 2. Hypertension: Mildly uncontrolled. For now continue home antihypertensives which may have to be held if she decompensates. 3. Arthritis: When necessary Tylenol. 4. History of breast cancer, status post left mastectomy. 5. History of glaucoma:     Code Status: Full  Family Communication: Discussed with son and daughter at bedside.  Disposition Plan: Home when medically stable.   Time spent: 55 minutes.  [facility] Pager [phone]  If 7PM-7AM, please contact night-coverage [email or web address] Password [email or web address] 01/24/2013, 3:44 PM

## 2013-01-24 NOTE — ED Provider Notes (Signed)
CSN: [id number]     Arrival date & time 01/24/13  4098 History     First MD Initiated Contact with Patient 01/24/13 1040     Chief Complaint  Patient presents with  . Rectal Bleeding   (Consider location/radiation/quality/duration/timing/severity/associated sxs/prior Treatment) HPI  79 year old female with history of breast cancer status post left mastectomy in 2012, history of arthritis and ulceration currently taking meloxicam presents complaining of rectal bleeding.  Patient states she was prescribed meloxicam for arthritic pain in L hip which she has been taking for the past several months. Last night while having bowel movement she noticed that her stools or black. She had another bowel movement today with black stools. She complained of mild left lower quadrant abdominal tenderness only. She denies any rectal pain. Denies fever, chills, lightheadedness, dizziness, chest pain, shortness of breath, nausea, vomiting, diarrhea, back pain, dysuria, hematuria, numbness or weakness. States she has prior hx of stomach ulcer in the past.  No complication for many years. She denies having any abnormal weight changes, fever, night sweats, or myalgias. No other complaints.    Past Medical History  Diagnosis Date  . Hypertension   . Cancer   . Glaucoma   . Allergy   . Arthritis   . Ulcer    Past Surgical History  Procedure Laterality Date  . Cholecystectomy    . Breast surgery  2011  . Breast surgery  2012    lt breast masty  . Abdominal hysterectomy     No family history on file. History  Substance Use Topics  . Smoking status: Never Smoker   . Smokeless tobacco: Never Used  . Alcohol Use: No   OB History   Grav Para Term Preterm Abortions TAB SAB Ect Mult Living                 Review of Systems  All other systems reviewed and are negative.    Allergies  Codeine  Home Medications   Current Outpatient Rx  Name  Route  Sig  Dispense  Refill  . acetaminophen  (TYLENOL) 325 MG tablet   Oral   Take 325 mg by mouth every 6 (six) hours as needed for pain.         . furosemide (LASIX) 40 MG tablet   Oral   Take 1 tablet (40 mg total) by mouth daily.   30 tablet   12   . hydrALAZINE (APRESOLINE) 50 MG tablet   Oral   Take 1 tablet (50 mg total) by mouth 3 (three) times daily.   90 tablet   12   . lisinopril (PRINIVIL,ZESTRIL) 20 MG tablet   Oral   Take 1 tablet (20 mg total) by mouth daily.   30 tablet   11   . meloxicam (MOBIC) 15 MG tablet   Oral   Take 1 tablet (15 mg total) by mouth daily.   30 tablet   11   . Polyethyl Glycol-Propyl Glycol (SYSTANE ULTRA) 0.4-0.3 % SOLN   Ophthalmic   Apply to eye.          . travoprost, benzalkonium, (TRAVATAN) 0.004 % ophthalmic solution   Both Eyes   Place 1 drop into both eyes at bedtime.          Ht 5\' 4"  (1.626 m)  Wt 170 lb (77.111 kg)  BMI 29.17 kg/m2 Physical Exam  Nursing note and vitals reviewed. Constitutional: She is oriented to person, place, and time. She appears well-developed and  well-nourished. No distress.  Awake, alert, nontoxic appearance  HENT:  Head: Atraumatic.  Eyes: Conjunctivae are normal. Right eye exhibits no discharge. Left eye exhibits no discharge.  Neck: Neck supple.  Cardiovascular: Normal rate and regular rhythm.   Pulmonary/Chest: Effort normal. No respiratory distress. She exhibits no tenderness.  Abdominal: Soft. Bowel sounds are normal. There is no tenderness. There is no rebound.  Genitourinary:  Chaperone present:  Normal rectal tone, no mass, no hemorrhoids, nontender on exam, melanotic stool noted on exam, Hemoccult positive.  Musculoskeletal: She exhibits no tenderness.  ROM appears intact, no obvious focal weakness  Neurological: She is alert and oriented to person, place, and time.  Mental status and motor strength appears intact  Skin: No rash noted.  Psychiatric: She has a normal mood and affect.    ED Course   Procedures  (including critical care time)  11:05 AM Patient is currently on any NSAIDs, presents with melanotic stool. This likely related to her NSAID use. However patient also has a history of breast cancer. She otherwise appears to be comfortable, with stable normal vital sign. Patient would benefit from further evaluation to identify the source of the bleeding which may include endoscopy and colonoscopy.  No significant abd pain at this time to suggest diverticulosis/itis.  Protonix via IV started along with IVF.    11:30 AM Pt is hemodynamically stable.  Hemoglobin is 12.6. Her labs are reassuring. I have consult [facility], Dr. Waymon Amato who agrees to admit patient to tele bed, under the care of attending Dr. Debby Bud. Will also consult GI specialist.   1:32 PM I have consulted GI specialist, Dr. Rhea Belton, who will consult pt inpt.    Labs Reviewed  COMPREHENSIVE METABOLIC PANEL - Abnormal; Notable for the following:    Glucose, Bld 104 (*)    GFR calc non Af Amer 59 (*)    GFR calc Af Amer 68 (*)    All other components within normal limits  OCCULT BLOOD, POC DEVICE - Abnormal; Notable for the following:    Fecal Occult Bld POSITIVE (*)    All other components within normal limits  CBC  TYPE AND SCREEN  ABO/RH   No results found. 1. GI bleed due to NSAIDs     MDM  BP 157/70  Pulse 66  Temp(Src) 98.6 F (37 C) (Oral)  Resp 22  Ht 5\' 4"  (1.626 m)  Wt 170 lb (77.111 kg)  BMI 29.17 kg/m2  SpO2 97%   [name], PA-C 01/24/13 1332

## 2013-01-24 NOTE — ED Notes (Signed)
Pt c/o having black colored stools onset last night. Pt reports 2 episodes of black stools, one last night and one this morning. Pt reports intermittent mid abdominal pain, denies pain at present. Pt reports intermittent nausea.

## 2013-01-24 NOTE — Progress Notes (Signed)
CCMD notified of patient arrival and placement on telemetry.

## 2013-01-25 ENCOUNTER — Encounter (HOSPITAL_COMMUNITY)
Admission: EM | Disposition: A | Discharge status: Home / Self Care | Payer: Self-pay | Source: Home / Self Care | Attending: Internal Medicine

## 2013-01-25 ENCOUNTER — Encounter (HOSPITAL_COMMUNITY): Payer: Self-pay | Admitting: *Deleted

## 2013-01-25 ENCOUNTER — Telehealth: Payer: Self-pay | Admitting: *Deleted

## 2013-01-25 DIAGNOSIS — I10 Essential (primary) hypertension: Secondary | ICD-10-CM | POA: Diagnosis not present

## 2013-01-25 DIAGNOSIS — K922 Gastrointestinal hemorrhage, unspecified: Secondary | ICD-10-CM | POA: Diagnosis not present

## 2013-01-25 DIAGNOSIS — K921 Melena: Secondary | ICD-10-CM | POA: Diagnosis not present

## 2013-01-25 DIAGNOSIS — K2940 Chronic atrophic gastritis without bleeding: Secondary | ICD-10-CM | POA: Diagnosis not present

## 2013-01-25 HISTORY — PX: ESOPHAGOGASTRODUODENOSCOPY: SHX5428

## 2013-01-25 LAB — CBC
HCT: 36.7 % (ref 36.0–46.0)
HCT: 36.9 % (ref 36.0–46.0)
MCH: 29 pg (ref 26.0–34.0)
MCHC: 33.5 g/dL (ref 30.0–36.0)
MCHC: 33.6 g/dL (ref 30.0–36.0)
MCV: 86.2 fL (ref 78.0–100.0)
Platelets: 319 10*3/uL (ref 150–400)
RDW: 14.5 % (ref 11.5–15.5)
RDW: 14.5 % (ref 11.5–15.5)

## 2013-01-25 LAB — BASIC METABOLIC PANEL
BUN: 14 mg/dL (ref 6–23)
Calcium: 8.8 mg/dL (ref 8.4–10.5)
Chloride: 103 mEq/L (ref 96–112)
Creatinine, Ser: 0.8 mg/dL (ref 0.50–1.10)
GFR calc Af Amer: 74 mL/min — ABNORMAL LOW (ref >90)
GFR calc non Af Amer: 63 mL/min — ABNORMAL LOW (ref >90)

## 2013-01-25 LAB — HEMOGLOBIN AND HEMATOCRIT, BLOOD: HCT: 39.7 % (ref 36.0–46.0)

## 2013-01-25 SURGERY — EGD (ESOPHAGOGASTRODUODENOSCOPY)
Anesthesia: Moderate Sedation

## 2013-01-25 MED ORDER — POTASSIUM CHLORIDE 10 MEQ/100ML IV SOLN
10.0000 meq | INTRAVENOUS | Status: AC
  Administered 2013-01-25 (×4): 10 meq via INTRAVENOUS
  Filled 2013-01-25 (×4): qty 100

## 2013-01-25 MED ORDER — MIDAZOLAM HCL 10 MG/2ML IJ SOLN
INTRAMUSCULAR | Status: DC | PRN
  Administered 2013-01-25 (×3): 1 mg via INTRAVENOUS

## 2013-01-25 MED ORDER — FENTANYL CITRATE 0.05 MG/ML IJ SOLN
INTRAMUSCULAR | Status: DC | PRN
  Administered 2013-01-25: 12.5 ug via INTRAVENOUS
  Administered 2013-01-25: 25 ug via INTRAVENOUS

## 2013-01-25 MED ORDER — BUTAMBEN-TETRACAINE-BENZOCAINE 2-2-14 % EX AERO
INHALATION_SPRAY | CUTANEOUS | Status: DC | PRN
  Administered 2013-01-25: 2 via TOPICAL

## 2013-01-25 MED ORDER — PANTOPRAZOLE SODIUM 40 MG PO TBEC
40.0000 mg | DELAYED_RELEASE_TABLET | Freq: Every day | ORAL | Status: DC
  Administered 2013-01-26: 40 mg via ORAL
  Filled 2013-01-25: qty 1

## 2013-01-25 NOTE — Consult Note (Signed)
[facility] Consultation  Referring Provider:   [facility]   Primary Care Physician:  Illene Regulus, MD Primary Gastroenterologist:  Formerly Dr. Victorino Dike.       Reason for Consultation: GI bleed             HPI:   [name] is a relatively healthy 79 y.o. female admitted last evening melena. She has been on Mobic at home according to home med list but patient doesn't really know her meds except that she takes Tylenol instead of aspirin at home.  In ED stools melenic, heme positive. She reports a black stool on Saturday, another on Sunday, no stools today. No nausea. She had a "twinge" of lower abdominal pain a few days ago, none since. Her son is at the bedside.   Past Medical History  Diagnosis Date  . Hypertension   . Cancer   . Glaucoma   . Allergy   . Arthritis   . Ulcer (remote)     Past Surgical History  Procedure Laterality Date  . Cholecystectomy    . Breast surgery  2011  . Breast surgery  2012    lt breast masty  . Abdominal hysterectomy      [facility]: Breast cancer. No colon cancer  History  Substance Use Topics  . Smoking status: Never Smoker   . Smokeless tobacco: Never Used  . Alcohol Use: No    Prior to Admission medications   Medication Sig Start Date End Date Taking? Authorizing Provider  acetaminophen (TYLENOL) 325 MG tablet Take 325 mg by mouth every 6 (six) hours as needed for pain.   Yes Historical Provider, MD  furosemide (LASIX) 40 MG tablet Take 1 tablet (40 mg total) by mouth daily. 03/09/12  Yes Jacques Navy, MD  hydrALAZINE (APRESOLINE) 50 MG tablet Take 1 tablet (50 mg total) by mouth 3 (three) times daily. 03/09/12  Yes Jacques Navy, MD  lisinopril (PRINIVIL,ZESTRIL) 20 MG tablet Take 1 tablet (20 mg total) by mouth daily. 03/09/12  Yes Jacques Navy, MD  meloxicam (MOBIC) 15 MG tablet Take 1 tablet (15 mg total) by mouth daily. 06/25/12  Yes Jacques Navy, MD  Polyethyl Glycol-Propyl Glycol (SYSTANE ULTRA)  0.4-0.3 % SOLN Apply to eye.    Yes Historical Provider, MD  travoprost, benzalkonium, (TRAVATAN) 0.004 % ophthalmic solution Place 1 drop into both eyes at bedtime.   Yes Historical Provider, MD    Current Facility-Administered Medications  Medication Dose Route Frequency Provider Last Rate Last Dose  . 0.9 %  sodium chloride infusion   Intravenous Continuous Elease Etienne, MD 50 mL/hr at 01/24/13 1635    . acetaminophen (TYLENOL) tablet 325 mg  325 mg Oral Q6H PRN Elease Etienne, MD      . albuterol (PROVENTIL) (5 MG/ML) 0.5% nebulizer solution 2.5 mg  2.5 mg Nebulization Q2H PRN Elease Etienne, MD      . Chlorhexidine Gluconate Cloth 2 % PADS 6 each  6 each Topical Q0600 Elease Etienne, MD   6 each at 01/25/13 [phone]  . furosemide (LASIX) tablet 40 mg  40 mg Oral Daily Elease Etienne, MD   40 mg at 01/24/13 1635  . hydrALAZINE (APRESOLINE) tablet 50 mg  50 mg Oral TID Elease Etienne, MD   50 mg at 01/24/13 2109  . lisinopril (PRINIVIL,ZESTRIL) tablet 20 mg  20 mg Oral Daily Elease Etienne, MD      . mupirocin ointment Idelle Jo)  2 % 1 application  1 application Nasal BID Elease Etienne, MD   1 application at 01/24/13 2138  . ondansetron (ZOFRAN) tablet 4 mg  4 mg Oral Q6H PRN Elease Etienne, MD       Or  . ondansetron (ZOFRAN) injection 4 mg  4 mg Intravenous Q6H PRN Elease Etienne, MD      . pantoprazole (PROTONIX) injection 40 mg  40 mg Intravenous Q12H Elease Etienne, MD   40 mg at 01/24/13 2109  . potassium chloride 10 mEq in 100 mL IVPB  10 mEq Intravenous Q1 Hr x 4 Jacques Navy, MD   10 mEq at 01/25/13 0814  . sodium chloride 0.9 % injection 3 mL  3 mL Intravenous Q12H Elease Etienne, MD   3 mL at 01/24/13 2109  . Travoprost (BAK Free) (TRAVATAN) 0.004 % ophthalmic solution SOLN 1 drop  1 drop Both Eyes QHS Elease Etienne, MD   1 drop at 01/24/13 2109    Allergies as of 01/24/2013 - Review Complete 01/24/2013  Allergen Reaction Noted  . Codeine      Review of Systems:    All systems reviewed and negative except where noted in HPI.   Physical Exam:  Vital signs in last 24 hours: Temp:  [98.3 F (36.8 C)-98.8 F (37.1 C)] 98.7 F (37.1 C) (08/25 0850) Pulse Rate:  [66-74] 70 (08/25 0850) Resp:  [20-22] 20 (08/25 0341) BP: (142-181)/(59-95) 164/77 mmHg (08/25 0850) SpO2:  [95 %-100 %] 100 % (08/25 0341) Weight:  [169 lb 15.6 oz (77.1 kg)-170 lb (77.111 kg)] 169 lb 15.6 oz (77.1 kg) (08/24 2200) Last BM Date:  (8/24) General:   Pleasant healthy appearing black female in NAD Head:  Normocephalic and atraumatic. Eyes:   No icterus.   Conjunctiva pink. Ears:  Normal auditory acuity. Neck:  Supple; no masses felt Lungs:  Respirations even and unlabored.A few bibasilar crackles.  Heart:  Regular rate and rhythm Abdomen:  Soft, nondistended, nontender. Normal bowel sounds. No appreciable masses or hepatomegaly.  Rectal:  Black stool in vault  Msk:  Symmetrical without gross deformities.  Extremities:  Without edema. Neurologic:  Alert and  oriented x4;  grossly normal neurologically. Skin:  Intact without significant lesions or rashes. Cervical Nodes:  No significant cervical adenopathy. Psych:  Alert and cooperative. Normal affect.  LAB RESULTS:  Recent Labs  01/24/13 1607 01/25/13 0011 01/25/13 0610  WBC 6.0 6.0 5.6  HGB 12.6 12.4 12.3  HCT 36.8 36.9 36.7  PLT 336 326 319   BMET  Recent Labs  01/24/13 1053 01/25/13 0610  NA 140 140  K 4.0 2.8*  CL 104 103  CO2 29 28  GLUCOSE 104* 92  BUN 20 14  CREATININE 0.85 0.80  CALCIUM 9.7 8.8   LFT  Recent Labs  01/24/13 1053  PROT 7.1  ALBUMIN 3.6  AST 16  ALT 8  ALKPHOS 82  BILITOT 0.7   PREVIOUS ENDOSCOPIES:            patient believes she had a colonoscopy by Dr. Corinda Gubler several years back but I am unable to find documentation of this.    Impression / Plan:   1. Upper GI bleed, appears low volume at this point with normal BUN and hgb at baseline of  12.3. She has black stool on exam (no bismuth). For further evaluation patient will be scheduled for EGD to be done today. Continue BID IV PPI, serial CBCs.  2. Hx of left breast cancer 2011, s/p lumpectomy Recurrence 2012 with mastectomy.  Followed by DR. Magrinat  3. Glaucoma  4. HTN  Thanks   LOS: 1 day   Willette Cluster  01/25/2013, 9:21 AM

## 2013-01-25 NOTE — Telephone Encounter (Signed)
[facility] Triage Call Report Triage Record Num: [id number] Operator: Judeen Hammans Patient Name: [name] Call Date & Time: 01/23/2013 10:26:53PM Patient Phone: [phone] PCP: Illene Regulus Patient Gender: Female PCP Fax : [phone] Patient DOB: 02/04/1921 Practice Name: [facility] Reason for Call: Caller: Tarsha/Granddaughter; PCP: Illene Regulus (Adults only); CB#: [id number]; Call regarding Dark brown Stool. Onset 8/23. Afebrile. Pt just had a bowel movement that is darker brown than usual and patient is concerned about GI bleeding. Verified that stool is not black and that it is of normal consistency. All emergent sxs ruled out per Diarrhea or Other Change in Bowel Habits Guideline. Care advice given. Advised pt to continue to monitor and if she notes black stools, tarry or bright red material coming from rectum to call back. Pt agrees to plan of care. Protocol(s) Used: Diarrhea or Other Change in Bowel Habits Recommended Outcome per Protocol: Provide Home/Self Care Reason for Outcome: All other situations Care Advice: ~ SYMPTOM / CONDITION MANAGEMENT Diarrheal Care: - Drink 2-3 quarts (2-3 liters) per day of low sugar content fluids, including over the counter oral hydration solution, unless directed otherwise by provider. - If accompanied by vomiting, take the fluids in frequent small sips or suck on ice chips. - Eat easily digested foods (such as bananas, rice, applesauce, toast, cooked cereals, soup, crackers, baked or boiled potato, or baked chicken or [place] without skin). - Do not eat high fiber, high fat, high sugar content foods, or highly seasoned foods. - Do not drink caffeinated or alcoholic beverages. - Avoid milk and milk products while having symptoms. As symptoms improve, gradually add back to diet. - Application of A&D ointment or witch hazel medicated pads may help anal irritation. - Antidiarrheal medications are usually unnecessary. If symptoms  are severe, consider nonprescription antidiarrheal and anti-motility drugs as directed by label or a provider. Do not take if have high fever or bloody diarrhea. If pregnant, do not take any medications not approved by your provider. - Consult your provider for advice regarding continuing prescription medication. ~ 08/

## 2013-01-25 NOTE — Consult Note (Signed)
Patient seen, examined, and I agree with the above documentation, including the assessment and plan. Agree with EGD to evaluate melena. Hx of NSAIDs chronically, so r/o PUD The nature of the procedure, as well as the risks, benefits, and alternatives were carefully and thoroughly reviewed with the patient. Ample time for discussion and questions allowed. The patient understood, was satisfied, and agreed to proceed.

## 2013-01-25 NOTE — Progress Notes (Signed)
Subjective: Patient feeling OK after EGD. Results reviewed - gastritis, no ulcer. Colonoscopy had been suggested and family is present to discuss options  Objective: Lab:  Recent Labs  01/24/13 1607 01/25/13 0011 01/25/13 0610 01/25/13 1803  WBC 6.0 6.0 5.6  --   HGB 12.6 12.4 12.3 13.3  HCT 36.8 36.9 36.7 39.7  MCV 85.4 86.2 85.7  --   PLT 336 326 319  --     Recent Labs  01/24/13 1053 01/25/13 0610  NA 140 140  K 4.0 2.8*  CL 104 103  GLUCOSE 104* 92  BUN 20 14  CREATININE 0.85 0.80  CALCIUM 9.7 8.8    Imaging:  Scheduled Meds: . Chlorhexidine Gluconate Cloth  6 each Topical Q0600  . furosemide  40 mg Oral Daily  . hydrALAZINE  50 mg Oral TID  . lisinopril  20 mg Oral Daily  . mupirocin ointment  1 application Nasal BID  . [START ON 01/26/2013] pantoprazole  40 mg Oral Q0600  . sodium chloride  3 mL Intravenous Q12H  . Travoprost (BAK Free)  1 drop Both Eyes QHS   Continuous Infusions:  PRN Meds:.acetaminophen, albuterol, ondansetron (ZOFRAN) IV, ondansetron   Physical Exam: Filed Vitals:   01/25/13 1755  BP: 140/63  Pulse: 85  Temp: 98.6 F (37 C)  Resp: 18   No repeat physical     Assessment/Plan: 1. GI - patient with very stable and improved Hgb. No active bleed on EGD. Discussed pros and cons of colonoscopy with the patient, her two sons and a cousin. Discussed potential findings, the time frame in regard to polyp growth (by her report she had colonoscopy 6-8 years ago that was negative for polyps - not in EPIC), the development of cancer and the effects/benefit of treatment in a 79 y/o.  Plan Continue PPI  Defer on colonoscopy at this time  D/c in AM with close f/u of Hgb and of bowel habit. Diet ordered.    Illene Regulus [place] [facility] (o) [phone]; (c) [phone] Call-grp - Patsi Sears IM  Tele: [phone]  01/25/2013, 6:51 PM

## 2013-01-25 NOTE — Op Note (Signed)
[facility] [address] [place], [address]   ENDOSCOPY PROCEDURE REPORT  PATIENT: [name], [name]  MR#: [id number] BIRTHDATE: 02/04/1921 , 89  yrs. old GENDER: Female ENDOSCOPIST: Beverley Fiedler, MD REFERRED BY:  [facility] PROCEDURE DATE:  01/25/2013 PROCEDURE:  EGD w/ biopsy ASA CLASS:     Class III INDICATIONS:  Melena. MEDICATIONS: These medications were titrated to patient response per physician's verbal order, Fentanyl 37.5 mcg IV, and Versed 3 mg IV  TOPICAL ANESTHETIC: Cetacaine Spray  DESCRIPTION OF PROCEDURE: After the risks benefits and alternatives of the procedure were thoroughly explained, informed consent was obtained.  The Pentax Gastroscope [id number] endoscope was introduced through the mouth and advanced to the second portion of the duodenum. Without limitations.  The instrument was slowly withdrawn as the mucosa was fully examined.  ESOPHAGUS: The mucosa of the esophagus appeared normal.  STOMACH: Mild gastritis (inflammation) was found at the incisura, in the gastric antrum, and prepyloric region of the stomach.  Mucosa was slightly friable, but no ulcers or erosions seen.  Multiple biopsies were performed using cold forceps.  DUODENUM: The duodenal mucosa showed no abnormalities in the bulb and second portion of the duodenum.  Retroflexed views revealed no abnormalities.     The scope was then withdrawn from the patient and the procedure completed.  COMPLICATIONS: There were no complications.  ENDOSCOPIC IMPRESSION: 1.   The mucosa of the esophagus appeared normal 2.   Gastritis (inflammation) was found at the incisura, in the gastric antrum, and prepyloric region of the stomach; multiple biopsies 3.   The duodenal mucosa showed no abnormalities in the bulb and second portion of the duodenum  RECOMMENDATIONS: 1.  Await pathology results 2.  Daily PPI for now 3.  Consider colonoscopy to evaluate melena/heme +  stool.  Patient would like discuss this more with family before proceeding.  eSigned:  Beverley Fiedler, MD 01/25/2013 3:26 PM CC:The Patient

## 2013-01-25 NOTE — Progress Notes (Signed)
CRITICAL VALUE ALERT  Critical value received:  K=2.8   Date of notification:  8/25  Time of notification: AM lab check at change of shift  Critical value read back: yes  Nurse who received alert: Delynn Flavin  MD notified (1st page):  Norins   Time of first page:  [phone] per cellphone   MD notified (2nd page): n/a  Time of second page: n/a  Responding MD:  Norins   Time MD responded: 0740 potassium orders placed

## 2013-01-25 NOTE — Progress Notes (Signed)
Subjective: [name] iws admitted for evaluati of dark tarry stools x 3 in a setting of NSAID use. She has not had much abdominal pain and her Hgb has been stable.   Objective: Lab:  Recent Labs  01/24/13 1607 01/25/13 0011 01/25/13 0610  WBC 6.0 6.0 5.6  HGB 12.6 12.4 12.3  HCT 36.8 36.9 36.7  MCV 85.4 86.2 85.7  PLT 336 326 319    Recent Labs  01/24/13 1053 01/25/13 0610  NA 140 140  K 4.0 2.8*  CL 104 103  GLUCOSE 104* 92  BUN 20 14  CREATININE 0.85 0.80  CALCIUM 9.7 8.8    Imaging:  Scheduled Meds: . Chlorhexidine Gluconate Cloth  6 each Topical Q0600  . furosemide  40 mg Oral Daily  . hydrALAZINE  50 mg Oral TID  . lisinopril  20 mg Oral Daily  . mupirocin ointment  1 application Nasal BID  . pantoprazole (PROTONIX) IV  40 mg Intravenous Q12H  . sodium chloride  3 mL Intravenous Q12H  . Travoprost (BAK Free)  1 drop Both Eyes QHS   Continuous Infusions: . sodium chloride 50 mL/hr at 01/24/13 1635   PRN Meds:.acetaminophen, albuterol, ondansetron (ZOFRAN) IV, ondansetron   Physical Exam: Filed Vitals:   01/25/13 0341  BP: 142/59  Pulse: 66  Temp: 98.4 F (36.9 C)  Resp: 20   Gen'l - elderly AA woman in no distress HEENT - missing most of her teeth, C&S clear Cor- 2+ radial, RRR Pulm - normal respirations Abd - hypoactive BS, no guarding or rebound Neuro - A&O x 3     Assessment/Plan: 1. GI - patient with melena suggesting an UGI bleed in setting of NSAID use and h/o prior ulcer. Hgb has remained stable  Plan  q12 H/H  GI to see for possible EGD   [name] IM (o) [phone]; (c) [phone] Call-grp - Patsi Sears IM  Tele: [phone]  01/25/2013, 7:23 AM

## 2013-01-25 NOTE — Progress Notes (Signed)
   CARE MANAGEMENT NOTE 01/25/2013  Patient:  [name], [name]   Account Number:  [id number]  Date Initiated:  01/25/2013  Documentation initiated by:  Jiles Crocker  Subjective/Objective Assessment:   ADMITTED WITH GIB     Action/Plan:   PCP: Illene Regulus, MD  LIVES AT HOME ALONE; CM FOLLOWING FOR DCP   Anticipated DC Date:  02/01/2013   Anticipated DC Plan:  HOME/SELF CARE      DC Planning Services  CM consult         Status of service:  In process, will continue to follow Medicare Important Message given?  NA - LOS <3 / Initial given by admissions (If response is "NO", the following Medicare IM given date fields will be blank)  Per UR Regulation:  Reviewed for med. necessity/level of care/duration of stay  Comments:  01/25/2013- B Breonia Kirstein RN,BSN,MHA

## 2013-01-26 ENCOUNTER — Encounter (HOSPITAL_COMMUNITY): Payer: Self-pay | Admitting: Internal Medicine

## 2013-01-26 DIAGNOSIS — K921 Melena: Secondary | ICD-10-CM | POA: Diagnosis not present

## 2013-01-26 DIAGNOSIS — I10 Essential (primary) hypertension: Secondary | ICD-10-CM | POA: Diagnosis not present

## 2013-01-26 DIAGNOSIS — K922 Gastrointestinal hemorrhage, unspecified: Secondary | ICD-10-CM | POA: Diagnosis not present

## 2013-01-26 LAB — POTASSIUM: Potassium: 3.4 mEq/L — ABNORMAL LOW (ref 3.5–5.1)

## 2013-01-26 LAB — HEMOGLOBIN AND HEMATOCRIT, BLOOD: HCT: 37 % (ref 36.0–46.0)

## 2013-01-26 MED ORDER — PANTOPRAZOLE SODIUM 40 MG PO TBEC: 40.0000 mg | DELAYED_RELEASE_TABLET | Freq: Every day | ORAL | Status: DC

## 2013-01-26 NOTE — Discharge Summary (Signed)
NAME:  [name], [name]              ACCOUNT NO.:  [id number]  MEDICAL RECORD NO.:  [id number]  LOCATION:  6E16C                        FACILITY:  [facility]  PHYSICIAN:  Rosalyn Gess. Raizy Auzenne, MD  DATE OF BIRTH:  02/04/1921  DATE OF ADMISSION:  01/24/2013 DATE OF DISCHARGE:  01/26/2013                              DISCHARGE SUMMARY   ADMITTING DIAGNOSIS:  Melena.  DISCHARGE DIAGNOSIS:  Upper gastrointestinal bleed from gastritis.  CONSULTANTS:  Erick Blinks, MD for GI.  PROCEDURES:  EGD performed by Dr. Rhea Belton on January 25, 2013, which showed the mucosa of the esophagus appeared normal.  Gastritis was found at the incisure into the gastric antrum and pre-pyloric region of the stomach.  Multiple biopsies were obtained.  Duodenum mucosa showed no abnormalities.  HISTORY OF PRESENT ILLNESS:  [name] is an 79 year old African American woman who takes nonsteroidal anti-inflammatory medications for her arthritis.  She presented to the emergency department after having had 2 dark black tarry stools the night prior to admission and one black stool on the day of admission.  She denied any dizziness, lightheadedness, chest pain, dyspnea.  She has had no nausea, vomiting, or epigastric pain.  The patient has had similar episodes in the past with a history of peptic ulcer disease.  Of note, the patient also has had a colonoscopy in her early 63s by Dr. Terrial Rhodes, which by her report was negative (with no report found in epic).  Because of her melena, she was admitted to the hospital for further evaluation and treatment.  Please see the H and P for past medical history, family history, and social history.  HOSPITAL COURSE: 1. GI:  The patient had serial hemoglobins,which remained very stable     at 12.6, 12.4, 12.3, 13.3, 12.3.  She had upper endoscopy with     results as noted above.  The patient was offered colonoscopy, but     after prolonged conversation with the patient, her 2 sons and  a     cousin, given her advanced age, given her recent colonoscopy in her     early 70s that was negative, at this point, colonoscopy is     declined.  The patient's hemoglobin and hematocrit have been stable.  She has continued to have some dark stools, but no signs of active bleeding. She has had no abdominal pain or discomfort.  She is now ready for discharge to home.  She will have a followup within 7 days at which time, we will do a repeat hemoglobin and hematocrit.  The patient is advised if she has any change in her bowel habits, abdominal pain or discomfort, weight loss or other symptoms.  Outpatient colonoscopy could be reconsidered.  The patient and her family were pleased with this disposition.  Hypokalemia: The patient was noted to have a potassium of 2.8 on the 25th.  She was given potassium 10 mEq/hour x4 IV runs.  We will obtain a B-Met prior to discharge.  DISCHARGE EXAMINATION:  VITAL SIGNS:  Temperature was 97.8, blood pressure was 130/58, heart rate 65, respirations 19, oxygen saturations 98% on room air. GENERAL APPEARANCE:  This is a pleasant overweight woman who looks younger  than his stated chronologic age. HEENT:  Mild arcus senilis is noted.  Pupils were equal, round, and reactive.  Oropharynx with missing frontal dentition, but no lesions were noted. CARDIOVASCULAR:  2+ radial pulse.  Her precordium was quiet.  She had a regular rate and rhythm. PULMONARY:  The patient has no increased work of breathing.  She has no rales, wheezes, or rhonchi. ABDOMEN:  Obese with positive bowel sounds in all 4 quadrants.  No guarding or rebound.  No tenderness was appreciated. GENITALIA AND RECTAL:  Deferred. NEUROLOGIC:  The patient is awake, alert.  She is oriented to person, place, time, and context.  FINAL LABORATORY:  Hemoglobin from the morning of discharge is 12.3 g, hematocrit 37%.  Last chemistries from January 25, 2013, with a sodium of 140, potassium 2.8,  chloride was 103, BUN was 14, creatinine 0.8.  DISCHARGE MEDICATIONS: 1. Tylenol 325 mg every 6 hours as needed for pain. 2. Lasix 40 mg daily. 3. Hydralazine 50 mg tablet 3 times daily. 4. Zestril 20 mg daily. 5. The patient is told to stop taking meloxicam at this time. 6. Protonix 40 mg q.a.m. 7. Systane ultra eye drops daily. 8. Travatan eyedrops 0.004% daily.  DISPOSITION:  The patient is discharged to home if her potassium is corrected.  She will be seen in followup within 7 days, at which time she will have a hemoglobin and hematocrit and with followup examination.  The patient's condition at the time of discharge dictation is medically stable.     Rosalyn Gess Shivani Barrantes, MD     MEN/MEDQ  D:  01/26/2013  T:  01/26/2013  Job:  [id number]

## 2013-01-26 NOTE — Progress Notes (Signed)
Subjective: Feels OK. Had a dark stool last night  Objective: Lab:  Recent Labs  01/24/13 1607 01/25/13 0011 01/25/13 0610 01/25/13 1803 01/26/13 0423  WBC 6.0 6.0 5.6  --   --   HGB 12.6 12.4 12.3 13.3 12.3  HCT 36.8 36.9 36.7 39.7 37.0  MCV 85.4 86.2 85.7  --   --   PLT 336 326 319  --   --     Recent Labs  01/24/13 1053 01/25/13 0610  NA 140 140  K 4.0 2.8*  CL 104 103  GLUCOSE 104* 92  BUN 20 14  CREATININE 0.85 0.80  CALCIUM 9.7 8.8    Imaging:  Scheduled Meds: . Chlorhexidine Gluconate Cloth  6 each Topical Q0600  . furosemide  40 mg Oral Daily  . hydrALAZINE  50 mg Oral TID  . lisinopril  20 mg Oral Daily  . mupirocin ointment  1 application Nasal BID  . pantoprazole  40 mg Oral Q0600  . sodium chloride  3 mL Intravenous Q12H  . Travoprost (BAK Free)  1 drop Both Eyes QHS   Continuous Infusions:  PRN Meds:.acetaminophen, albuterol, ondansetron (ZOFRAN) IV, ondansetron   Physical Exam: Filed Vitals:   01/26/13 0628  BP: 130/58  Pulse: 65  Temp: 97.8 F (36.6 C)  Resp: 19   See d/c summary for exam      Assessment/Plan: Stable with no evidence of continued bleed. For d/c home.  Dictation # [id number]   Illene Regulus [place] [facility] [phone]; (c) [phone] Call-grp - Patsi Sears IM  Tele: [phone]  01/26/2013, 7:17 AM

## 2013-01-27 NOTE — ED Provider Notes (Signed)
Medical screening examination/treatment/procedure(s) were conducted as a shared visit with non-physician practitioner(s) and myself.  I personally evaluated the patient during the encounter  79 yo female with dark stools, found to be hemoccult positive.  Elderly appearing, in no distress.  Abdomen soft and nontender.  Felt to need admission for further GI bleed workup given advanced age and risk for deterioration.    Clinical Impression: 1. GI bleed due to NSAIDs     Candyce Churn, MD 01/27/13 1304

## 2013-01-27 NOTE — ED Provider Notes (Signed)
Medical screening examination/treatment/procedure(s) were conducted as a shared visit with non-physician practitioner(s) and myself.  I personally evaluated the patient during the encounter.   Please see my separate note.    Candyce Churn, MD 01/27/13 [phone]

## 2013-01-28 ENCOUNTER — Encounter: Payer: Self-pay | Admitting: Internal Medicine

## 2013-02-02 ENCOUNTER — Ambulatory Visit: Payer: Medicare Other | Admitting: Internal Medicine

## 2013-02-04 ENCOUNTER — Other Ambulatory Visit (INDEPENDENT_AMBULATORY_CARE_PROVIDER_SITE_OTHER): Payer: Medicare Other

## 2013-02-04 ENCOUNTER — Encounter: Payer: Self-pay | Admitting: Internal Medicine

## 2013-02-04 ENCOUNTER — Ambulatory Visit (INDEPENDENT_AMBULATORY_CARE_PROVIDER_SITE_OTHER): Payer: Medicare Other | Admitting: Internal Medicine

## 2013-02-04 VITALS — BP 148/70 | HR 84 | Temp 98.0°F | Wt 168.0 lb

## 2013-02-04 DIAGNOSIS — I10 Essential (primary) hypertension: Secondary | ICD-10-CM

## 2013-02-04 DIAGNOSIS — K922 Gastrointestinal hemorrhage, unspecified: Secondary | ICD-10-CM

## 2013-02-04 DIAGNOSIS — E876 Hypokalemia: Secondary | ICD-10-CM | POA: Diagnosis not present

## 2013-02-04 LAB — COMPREHENSIVE METABOLIC PANEL
AST: 14 U/L (ref 0–37)
Alkaline Phosphatase: 78 U/L (ref 39–117)
Glucose, Bld: 93 mg/dL (ref 70–99)
Potassium: 4.3 mEq/L (ref 3.5–5.1)
Sodium: 141 mEq/L (ref 135–145)
Total Bilirubin: 0.7 mg/dL (ref 0.3–1.2)
Total Protein: 7.3 g/dL (ref 6.0–8.3)

## 2013-02-04 LAB — HEMOGLOBIN AND HEMATOCRIT, BLOOD
HCT: 37.8 % (ref 36.0–46.0)
Hemoglobin: 12.6 g/dL (ref 12.0–15.0)

## 2013-02-04 MED ORDER — HYDRALAZINE HCL 50 MG PO TABS: 50.0000 mg | ORAL_TABLET | Freq: Three times a day (TID) | ORAL | Status: DC

## 2013-02-04 MED ORDER — FUROSEMIDE 40 MG PO TABS: 40.0000 mg | ORAL_TABLET | Freq: Every day | ORAL | Status: DC

## 2013-02-04 MED ORDER — LISINOPRIL 20 MG PO TABS: 20.0000 mg | ORAL_TABLET | Freq: Every day | ORAL | Status: DC

## 2013-02-04 NOTE — Progress Notes (Signed)
  Subjective:    Patient ID: [name], female    DOB: 02/04/1921, 79 y.o.   MRN: [id number]  HPI Mrs. Zolman presents for hospital follow up after being admitted for melena. EGD revealed with gastritis. Her Hgb never dropped. She reports that she still has dark stools but they are better. She has been feeling good except for diffuse MSK pain.  PMH, FamHx and SocHx reviewed for any changes and relevance.  Current Outpatient Prescriptions on File Prior to Visit  Medication Sig Dispense Refill  . acetaminophen (TYLENOL) 325 MG tablet Take 325 mg by mouth every 6 (six) hours as needed for pain.      . furosemide (LASIX) 40 MG tablet Take 1 tablet (40 mg total) by mouth daily.  30 tablet  12  . hydrALAZINE (APRESOLINE) 50 MG tablet Take 1 tablet (50 mg total) by mouth 3 (three) times daily.  90 tablet  12  . lisinopril (PRINIVIL,ZESTRIL) 20 MG tablet Take 1 tablet (20 mg total) by mouth daily.  30 tablet  11  . pantoprazole (PROTONIX) 40 MG tablet Take 1 tablet (40 mg total) by mouth daily at 6 (six) AM.  30 tablet  5  . travoprost, benzalkonium, (TRAVATAN) 0.004 % ophthalmic solution Place 1 drop into both eyes at bedtime.       No current facility-administered medications on file prior to visit.      Review of Systems System review is negative for any constitutional, cardiac, pulmonary, GI or neuro symptoms or complaints other than as described in the HPI.     Objective:   Physical Exam Filed Vitals:   02/04/13 1028  BP: 148/70  Pulse: 84  Temp: 98 F (36.7 C)   Gen'l - Elderly AA woman in no distress COr - RRR Pulm - normal respirations.   Hgb 12.6     Assessment & Plan:  Melena - patient for follow up of melena secondary to gastritis. She has been doing fine Hgb is stable. Plan No further follow up  Remain off NSAIDs  Low Potassium -  Lab Results  Component Value Date   K 4.3 02/04/2013   Corrected and normal.

## 2013-02-04 NOTE — Patient Instructions (Addendum)
HAPPY BIRTHDAY, HAPPY BIRTHDAY, HAPPY BIRTHDAY TO YOU.   I am glad you are doing ok.  We will check your blood count and potassium today. The results will be posted to MyChart if you have an account opened.  Blood pressure medications are reordered.   For "[name]" take tylenol 500 mg  1 or 2 tablets three times a day on schedule.

## 2013-02-05 ENCOUNTER — Encounter: Payer: Self-pay | Admitting: Internal Medicine

## 2013-02-22 DIAGNOSIS — H409 Unspecified glaucoma: Secondary | ICD-10-CM | POA: Diagnosis not present

## 2013-02-22 DIAGNOSIS — H4011X0 Primary open-angle glaucoma, stage unspecified: Secondary | ICD-10-CM | POA: Diagnosis not present

## 2013-02-23 DIAGNOSIS — L608 Other nail disorders: Secondary | ICD-10-CM | POA: Diagnosis not present

## 2013-03-04 ENCOUNTER — Other Ambulatory Visit: Payer: Medicare Other | Admitting: Lab

## 2013-03-08 ENCOUNTER — Other Ambulatory Visit: Payer: Self-pay | Admitting: Oncology

## 2013-03-08 ENCOUNTER — Telehealth: Payer: Self-pay | Admitting: *Deleted

## 2013-03-08 NOTE — Telephone Encounter (Deleted)
Spoke with patient and confirmed she is taking her Ktab twice daily. She admits to having 3-4 loose stools/day and has not been taking Imodium. Suggested she take 1-2 Imodium/day to slow down the bowel movements to help her K+ level stay up. She agrees to do so. Refill request to MD for review to confirm bid dosing is still what is desired.

## 2013-03-11 ENCOUNTER — Other Ambulatory Visit: Payer: Self-pay | Admitting: Oncology

## 2013-03-11 ENCOUNTER — Telehealth: Payer: Self-pay | Admitting: Oncology

## 2013-03-11 ENCOUNTER — Other Ambulatory Visit: Payer: Self-pay | Admitting: *Deleted

## 2013-03-11 ENCOUNTER — Encounter (INDEPENDENT_AMBULATORY_CARE_PROVIDER_SITE_OTHER): Payer: Self-pay

## 2013-03-11 ENCOUNTER — Ambulatory Visit (HOSPITAL_BASED_OUTPATIENT_CLINIC_OR_DEPARTMENT_OTHER): Payer: Medicare Other | Admitting: Oncology

## 2013-03-11 VITALS — BP 179/77 | HR 78 | Temp 98.1°F | Resp 19 | Ht 64.0 in | Wt 169.7 lb

## 2013-03-11 DIAGNOSIS — D0590 Unspecified type of carcinoma in situ of unspecified breast: Secondary | ICD-10-CM | POA: Diagnosis not present

## 2013-03-11 DIAGNOSIS — Z170 Estrogen receptor positive status [ER+]: Secondary | ICD-10-CM

## 2013-03-11 DIAGNOSIS — C50912 Malignant neoplasm of unspecified site of left female breast: Secondary | ICD-10-CM

## 2013-03-11 DIAGNOSIS — D0512 Intraductal carcinoma in situ of left breast: Secondary | ICD-10-CM | POA: Insufficient documentation

## 2013-03-11 MED ORDER — HYDRALAZINE HCL 50 MG PO TABS: 50.0000 mg | ORAL_TABLET | Freq: Three times a day (TID) | ORAL | Status: DC

## 2013-03-11 NOTE — Progress Notes (Signed)
ID: [name]   DOB: 02/04/1921  MR#: [id number]  CSN#:[id number]  PCP: Barbara Regulus, MD GYN:  SU: Barbara Rudd MD OTHER MD:   HISTORY OF PRESENT ILLNESS: [name] had her annual screening mammogram at [facility] this year, November 21, 2009.  There was a new 3 mm calcification cluster in the left breast and the patient was recalled for additional views on November 30, 2009.  These magnification views showed the new cluster to be anterior and to include both punctate and linear forms in a branching pattern.  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  With that information, the patient was referred to Dr. Corliss [name], and bilateral breast MRIs were obtained July 26th.  This showed primarily post biopsy change in the left breast associated with a small amount of enhancement, the entire area measuring up to 1.5 cm with no additional areas of concern.    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 This was read as high-grade.  Margins were negative and the closest was 3 mm. The tumor cells were 100% ER and 83% PR positive.  Her subsequent history is as detailed below  INTERVAL HISTORY: [name] returns today for followup of her noninvasive breast cancer, accompanied by her granddaughter [name] (who works across the street at [employer]). Since her last visit here [name] was admitted with a GI bleed. All labs has resolved and now her chief complaint in that regard is mild constipation.  REVIEW OF SYSTEMS: She has some pain in her right shoulder with slight restriction of motion, and tells me  she has a history of bursitis. In the morning when she gets up she has a little bit of low back pain but during the day that gets better. She cleans her house, does a little cooking, and generally is quite active for her age. She has not noted any melena or bright red blood per rectum since her hospitalization. She has some discomfort in the left chest wall surgical area, as expected. This is intermittent and mild. A detailed review of systems was otherwise stable  PAST MEDICAL HISTORY: Past Medical History  Diagnosis Date  . Hypertension   . Cancer   . Glaucoma   . Allergy   . Arthritis   . Ulcer   . Complication of anesthesia     shortness of breath after procedure    PAST SURGICAL HISTORY: Past Surgical History  Procedure Laterality Date  . Cholecystectomy    . Breast surgery  2011  . Breast surgery  2012    lt breast masty  . Abdominal hysterectomy    . Esophagogastroduodenoscopy N/A 01/25/2013    Procedure: ESOPHAGOGASTRODUODENOSCOPY (EGD);  Surgeon: Beverley Fiedler, MD;  Location: [facility] ENDOSCOPY;  Service: Gastroenterology;  Laterality: N/A;    FAMILY HISTORY No family history on file. The patient's father left the family after the 11th child was born, so she knows little about him.  The patient's mother died from a heart attack at the age of 77.  Among the patient's 10 siblings, there was no breast or ovarian cancer, although there were 2 "liver cancers" and one lung cancer.  GYNECOLOGIC HISTORY: She is GX P7, first pregnancy to term at age 79.  She underwent her hysterectomy in her forties.  She never took hormones.    SOCIAL HISTORY: She used to work at cleaning homes.  Her husband died approximately 6 years ago and the patient now lives by herself.  In a pinch, she calls her neighbor, [name], although her granddaughter [name], who is my patient [name]' niece) is very involved with her.  [name]'s phone number is [phone].  The patient has a daughter, [name], who  lives in [place] and works as a Financial risk analyst and a son, [name], who also is a Financial risk analyst and also lives in [place].  She has a son, [name], who is a Development worker, international aid in [place].  The patient attends [employer].     ADVANCED DIRECTIVES:  HEALTH MAINTENANCE: History  Substance Use Topics  . Smoking status: Never Smoker   . Smokeless tobacco: Never Used  . Alcohol Use: No     Colonoscopy:  PAP:  Bone density:  Lipid panel:  Allergies  Allergen Reactions  . Codeine     REACTION: causes nausea and vomiting    Current Outpatient Prescriptions  Medication Sig Dispense Refill  . acetaminophen (TYLENOL) 325 MG tablet Take 325 mg by mouth every 6 (six) hours as needed for pain.      . furosemide (LASIX) 40 MG tablet Take 1 tablet (40 mg total) by mouth daily.  30 tablet  12  . hydrALAZINE (APRESOLINE) 50 MG tablet Take 1 tablet (50 mg total) by mouth 3 (three) times daily.  90 tablet  12  . lisinopril (PRINIVIL,ZESTRIL) 20 MG tablet Take 1 tablet (20 mg total) by mouth daily.  30 tablet  11  . pantoprazole (PROTONIX) 40 MG tablet Take 1 tablet (40 mg total) by mouth daily at 6 (six) AM.  30 tablet  5  . travoprost, benzalkonium, (TRAVATAN) 0.004 % ophthalmic solution Place 1 drop into both eyes at bedtime.       No current facility-administered medications for this visit.    OBJECTIVE: Elderly African American woman who appears stated age 79 Vitals:   03/11/13 1020  BP: 179/77  Pulse: 78  Temp: 98.1 F (36.7 C)  Resp: 19     Body mass index is 29.11 kg/(m^2).    ECOG FS: 2  Sclerae unicteric Oropharynx no thrush or other lesions No cervical or supraclavicular adenopathy Lungs no rales or rhonchi Heart regular rate and rhythm Abd soft, nontender, positive bowel sounds MSK no focal spinal tenderness Neuro: nonfocal, well oriented, positive affect Breasts: The right breast is unremarkable. The right axilla is clear. The left breast is status post mastectomy. The scar is  very irregular, as previously noted. There is no evidence of local recurrence. The left axilla is benign.  LAB RESULTS: Lab Results  Component Value Date   WBC 5.6 01/25/2013   NEUTROABS 3.2 03/04/2012   HGB 12.6 02/04/2013   HCT 37.8 02/04/2013   MCV 85.7 01/25/2013   PLT 319 01/25/2013      Chemistry      Component Value Date/Time   NA 141 02/04/2013 1145   NA 141 03/04/2012 1301   K 4.3 02/04/2013 1145   K 4.5 03/04/2012 1301   CL 105 02/04/2013 1145   CL 105 03/04/2012 1301   CO2 31 02/04/2013 1145   CO2 26 03/04/2012 1301   BUN 16 02/04/2013 1145   BUN 18.0 03/04/2012 1301   CREATININE  0.9 02/04/2013 1145   CREATININE 0.9 03/04/2012 1301   CREATININE 0.89 03/21/2011 1130      Component Value Date/Time   CALCIUM 9.4 02/04/2013 1145   CALCIUM 9.5 03/04/2012 1301   ALKPHOS 78 02/04/2013 1145   ALKPHOS 92 03/04/2012 1301   AST 14 02/04/2013 1145   AST 14 03/04/2012 1301   ALT 8 02/04/2013 1145   ALT <6 03/04/2012 1301   BILITOT 0.7 02/04/2013 1145   BILITOT 0.50 03/04/2012 1301       Lab Results  Component Value Date   LABCA2 21 03/04/2012    No components found with this basename: LABCA125    No results found for this basename: INR,  in the last 168 hours  Urinalysis    Component Value Date/Time   COLORURINE YELLOW 11/13/2008 0720   APPEARANCEUR CLEAR 11/13/2008 0720   LABSPEC 1.018 11/13/2008 0720   PHURINE 6.0 11/13/2008 0720   GLUCOSEU NEGATIVE 11/13/2008 0720   HGBUR NEGATIVE 11/13/2008 0720   BILIRUBINUR NEGATIVE 11/13/2008 0720   KETONESUR NEGATIVE 11/13/2008 0720   PROTEINUR NEGATIVE 11/13/2008 0720   UROBILINOGEN 1.0 11/13/2008 0720   NITRITE NEGATIVE 11/13/2008 0720   LEUKOCYTESUR MODERATE* 11/13/2008 0720    STUDIES:Right mamogram 02/06/2012 was benign  ASSESSMENT: 79 y.o. [place] woman:  (1) Status post left lumpectomy in August 2011 for ductal carcinoma in situ, high-grade, measuring 1.5 cm.  Negative margins.  ER, PR positive.  The patient decided to forego both adjuvant  antiestrogen and adjuvant radiation at that time.  (2) Local recurrence diagnosed in August 2012.  Now status post left simple mastectomy on 01/28/2011 for a 1.6 cm high-grade ductal carcinoma in situ with negative margins, 0/1 lymph node involved, estrogen and progesterone receptor positive.   PLAN: Ms. Wemhoff is doing quite well for her age. She is almost certainly cured from her left breast cancer. She prefers to coming here once a year, as opposed to "graduating".. She wants to make sure am going to mass regularly, for one thing!  We are going to set her up for mammography every 2 years, which means her next mammogram will be due September of 2015. She will see me October of next year. Today I refilled her hydralazine, at her request. She knows to call for any problems that may develop before her next visit.  Ameenah Prosser C    03/11/2013

## 2013-03-11 NOTE — Telephone Encounter (Signed)
,  l

## 2013-03-23 ENCOUNTER — Other Ambulatory Visit: Payer: Self-pay | Admitting: Oncology

## 2013-03-23 DIAGNOSIS — C50912 Malignant neoplasm of unspecified site of left female breast: Secondary | ICD-10-CM

## 2013-04-06 ENCOUNTER — Encounter: Payer: Self-pay | Admitting: Internal Medicine

## 2013-04-06 ENCOUNTER — Ambulatory Visit (INDEPENDENT_AMBULATORY_CARE_PROVIDER_SITE_OTHER): Payer: Medicare Other | Admitting: Internal Medicine

## 2013-04-06 ENCOUNTER — Ambulatory Visit: Payer: Medicare Other | Admitting: Internal Medicine

## 2013-04-06 VITALS — BP 156/76 | HR 80 | Temp 97.9°F | Wt 168.8 lb

## 2013-04-06 DIAGNOSIS — K219 Gastro-esophageal reflux disease without esophagitis: Secondary | ICD-10-CM

## 2013-04-06 DIAGNOSIS — I10 Essential (primary) hypertension: Secondary | ICD-10-CM

## 2013-04-06 DIAGNOSIS — M1990 Unspecified osteoarthritis, unspecified site: Secondary | ICD-10-CM

## 2013-04-06 NOTE — Progress Notes (Signed)
Pre-visit discussion using our clinic review tool. No additional management support is needed unless otherwise documented below in the visit note.  

## 2013-04-06 NOTE — Patient Instructions (Signed)
It is good to see you and the report from Dr. Darnelle Catalan is very good!!  1. Blood pressure - see flow sheet. Your blood pressure is at the borderline today at 156/76. Plan Continue present medications  Please check your blood pressure at the drug store and let me know the readings.  2. Gastritis - you are doing well. There is no need to repeat a blood count today Plan Continue the protonix once a day.  3. Arthritis - involving many joints. Plan Ok to use aspercreme on the sorest joints.   To keep pain under control please take tylenol 500 mg three times a day, every day.  Over all this is a good report.

## 2013-04-07 DIAGNOSIS — M1990 Unspecified osteoarthritis, unspecified site: Secondary | ICD-10-CM | POA: Insufficient documentation

## 2013-04-07 NOTE — Assessment & Plan Note (Signed)
Blood pressure - see flow sheet. Your blood pressure is at the borderline today at 156/76. Plan Continue present medications  Please check your blood pressure at the drug store and let me know the readings.

## 2013-04-07 NOTE — Assessment & Plan Note (Signed)
Gastritis - you are doing well. There is no need to repeat a blood count today Plan Continue the protonix once a day.

## 2013-04-07 NOTE — Assessment & Plan Note (Signed)
Arthritis - involving many joints. Plan Ok to use aspercreme on the sorest joints.   To keep pain under control please take tylenol 500 mg three times a day, every day.

## 2013-04-07 NOTE — Progress Notes (Signed)
  Subjective:    Patient ID: [name], female    DOB: July 20, 1921, 79 y.o.   MRN: [id number]  HPI [name] present for follow up after a hospitalization approximately 2 months ago for gastritis with active bleeding. Reviewed hospital d/c summary. Since her last office visit she has been doing well - no signs of gastritis or active bleed. She has continued to take PPI therapy.  Lab Results  Component Value Date   HGB 12.6 02/04/2013   She has had a good visit with Dr. Darnelle Catalan with a very positive report in regard to her episode of Breast cancer.  Many joints are giving her pain but she is not suffering any limitation in activity. She does not take any medication on a regular basis.  PMH, FamHx and SocHx reviewed for any changes and relevance.  Current Outpatient Prescriptions on File Prior to Visit  Medication Sig Dispense Refill  . acetaminophen (TYLENOL) 325 MG tablet Take 325 mg by mouth every 6 (six) hours as needed for pain.      . furosemide (LASIX) 40 MG tablet TAKE ONE TABLET BY MOUTH ONE TIME DAILY.  30 tablet  PRN  . hydrALAZINE (APRESOLINE) 50 MG tablet Take 1 tablet (50 mg total) by mouth 3 (three) times daily.  90 tablet  12  . lisinopril (PRINIVIL,ZESTRIL) 20 MG tablet Take 1 tablet (20 mg total) by mouth daily.  30 tablet  11  . pantoprazole (PROTONIX) 40 MG tablet Take 1 tablet (40 mg total) by mouth daily at 6 (six) AM.  30 tablet  5  . travoprost, benzalkonium, (TRAVATAN) 0.004 % ophthalmic solution Place 1 drop into both eyes at bedtime.       No current facility-administered medications on file prior to visit.      Review of Systems System review is negative for any constitutional, cardiac, pulmonary, GI or neuro symptoms or complaints other than as described in the HPI.     Objective:   Physical Exam Filed Vitals:   04/06/13 1103  BP: 156/76  Pulse: 80  Temp: 97.9 F (36.6 C)   Wt Readings from Last 3 Encounters:  04/06/13 168 lb 12.8 oz (76.567  kg)  03/11/13 169 lb 11.2 oz (76.975 kg)  02/04/13 168 lb (76.204 kg)   BP Readings from Last 3 Encounters:  04/06/13 156/76  03/11/13 179/77  02/04/13 148/70   Gen'l - older woman in no distress, bright and alert HEENT- [place]/AT, C&S slightly muddy Cor- 2+ radial RRR Pulm - normal respirations Neuro - A&O, stands without assistance but is slow to rise. Ambulates with use of cane but is independent MSK - hands w/o actively inflammed joints.       Assessment & Plan:

## 2013-05-04 ENCOUNTER — Telehealth: Payer: Self-pay

## 2013-05-04 NOTE — Telephone Encounter (Signed)
Phone call to patient to let her know she is due for Prevnar vaccine. She will call back to schedule this.

## 2013-05-24 DIAGNOSIS — H409 Unspecified glaucoma: Secondary | ICD-10-CM | POA: Diagnosis not present

## 2013-05-24 DIAGNOSIS — H4011X0 Primary open-angle glaucoma, stage unspecified: Secondary | ICD-10-CM | POA: Diagnosis not present

## 2013-05-24 DIAGNOSIS — Z961 Presence of intraocular lens: Secondary | ICD-10-CM | POA: Diagnosis not present

## 2013-05-25 ENCOUNTER — Ambulatory Visit (INDEPENDENT_AMBULATORY_CARE_PROVIDER_SITE_OTHER): Payer: Medicare Other

## 2013-05-25 DIAGNOSIS — Z23 Encounter for immunization: Secondary | ICD-10-CM | POA: Diagnosis not present

## 2013-07-28 ENCOUNTER — Other Ambulatory Visit (HOSPITAL_COMMUNITY): Payer: Self-pay | Admitting: Internal Medicine

## 2013-08-11 ENCOUNTER — Encounter: Payer: Self-pay | Admitting: Internal Medicine

## 2013-08-11 ENCOUNTER — Ambulatory Visit (INDEPENDENT_AMBULATORY_CARE_PROVIDER_SITE_OTHER): Payer: Medicare Other | Admitting: Internal Medicine

## 2013-08-11 VITALS — BP 150/76 | HR 77 | Temp 98.1°F | Wt 167.4 lb

## 2013-08-11 DIAGNOSIS — K219 Gastro-esophageal reflux disease without esophagitis: Secondary | ICD-10-CM | POA: Diagnosis not present

## 2013-08-11 DIAGNOSIS — I10 Essential (primary) hypertension: Secondary | ICD-10-CM

## 2013-08-11 NOTE — Progress Notes (Signed)
   Subjective:    Patient ID: [name], female    DOB: December 02, 1921, 80 y.o.   MRN: [id number]  HPI [name] presents for follow up. She was last seen in November after hospitalization for a GI bleed. She has been doing well on the Protonix, without pain or dyspepsia. She is feeling well and her daughter corroborates her report.  She needs to be referred to another physician.   Past Medical History  Diagnosis Date  . Hypertension   . Cancer   . Glaucoma   . Allergy   . Arthritis   . Ulcer   . Complication of anesthesia     shortness of breath after procedure   Past Surgical History  Procedure Laterality Date  . Cholecystectomy    . Breast surgery  2011  . Breast surgery  2012    lt breast masty  . Abdominal hysterectomy    . Esophagogastroduodenoscopy N/A 01/25/2013    Procedure: ESOPHAGOGASTRODUODENOSCOPY (EGD);  Surgeon: Jerene Bears, MD;  Location: [place];  Service: Gastroenterology;  Laterality: N/A;   History reviewed. No pertinent family history. History   Social History  . Marital Status: Widowed    Spouse Name: N/A    Number of Children: 7  . Years of Education: 8   Occupational History  .     Social History Main Topics  . Smoking status: Never Smoker   . Smokeless tobacco: Never Used  . Alcohol Use: No  . Drug Use: No  . Sexual Activity: No   Other Topics Concern  . Not on file   Social History Narrative   8th grade. Married '40 - 52 yrs/widowed. 5 sons, 2 daughters; 6 grandchildren; 12 great grands.   Lives alone and manages ADLs except driving. Family is very supportive. ACP -discussed with her and referred to "[employer]." (Oct '13)    Current Outpatient Prescriptions on File Prior to Visit  Medication Sig Dispense Refill  . acetaminophen (TYLENOL) 325 MG tablet Take 325 mg by mouth every 6 (six) hours as needed for pain.      . furosemide (LASIX) 40 MG tablet TAKE ONE TABLET BY MOUTH ONE TIME DAILY.  30 tablet  PRN   . hydrALAZINE (APRESOLINE) 50 MG tablet Take 1 tablet (50 mg total) by mouth 3 (three) times daily.  90 tablet  12  . lisinopril (PRINIVIL,ZESTRIL) 20 MG tablet Take 1 tablet (20 mg total) by mouth daily.  30 tablet  11  . pantoprazole (PROTONIX) 40 MG tablet TAKE 1 TABLET BY MOUTH DAILY AT 6 AM  30 tablet  5  . travoprost, benzalkonium, (TRAVATAN) 0.004 % ophthalmic solution Place 1 drop into both eyes at bedtime.       No current facility-administered medications on file prior to visit.       Review of Systems System review is negative for any constitutional, cardiac, pulmonary, GI or neuro symptoms or complaints other than as described in the HPI.     Objective:   Physical Exam Filed Vitals:   08/11/13 1521  BP: 150/76  Pulse: 77  Temp: 98.1 F (36.7 C)   Gen'l- elderly woman who appears to be healthy and in good spirits HEENT - [place]/AT, missing many teeth, C&S a bit muddy, PERRLA Cor 2+ radial pulse, regular Pulm - normal respirations Neuro - mental status/memory not tested, she is awake, alert and speech is clear.        Assessment & Plan:

## 2013-08-11 NOTE — Progress Notes (Signed)
Pre visit review using our clinic review tool, if applicable. No additional management support is needed unless otherwise documented below in the visit note. 

## 2013-08-11 NOTE — Patient Instructions (Signed)
Thanks for coming to see me all these years.  You are doing very well with your stomach and I do want you to continue the Pantoprazole (Protonix)  Joint pain - Old Arnell Sieving, your friend for life. It is fine to continue the tylenol.  Your new doctor will be Dr. Jenny Reichmann - a very good doctor with a bit of a different style.

## 2013-08-14 NOTE — Assessment & Plan Note (Signed)
Stable at this time with no report of signs or symptoms of recurrent bleed.  Plan To continue on PPI therapy.

## 2013-08-14 NOTE — Assessment & Plan Note (Signed)
BP Readings from Last 3 Encounters:  08/11/13 150/76  04/06/13 156/76  03/11/13 179/77   Adequate control - JNC 8 - on current medications.  Plan Continue present regimen.

## 2013-08-23 DIAGNOSIS — H2510 Age-related nuclear cataract, unspecified eye: Secondary | ICD-10-CM | POA: Diagnosis not present

## 2013-11-15 DIAGNOSIS — H409 Unspecified glaucoma: Secondary | ICD-10-CM | POA: Diagnosis not present

## 2013-11-15 DIAGNOSIS — H4011X0 Primary open-angle glaucoma, stage unspecified: Secondary | ICD-10-CM | POA: Diagnosis not present

## 2013-12-30 DIAGNOSIS — H5789 Other specified disorders of eye and adnexa: Secondary | ICD-10-CM | POA: Diagnosis not present

## 2014-01-25 DIAGNOSIS — L608 Other nail disorders: Secondary | ICD-10-CM | POA: Diagnosis not present

## 2014-01-25 DIAGNOSIS — L84 Corns and callosities: Secondary | ICD-10-CM | POA: Diagnosis not present

## 2014-01-31 ENCOUNTER — Other Ambulatory Visit: Payer: Self-pay

## 2014-01-31 MED ORDER — LISINOPRIL 20 MG PO TABS: 20.0000 mg | ORAL_TABLET | Freq: Every day | ORAL | Status: DC

## 2014-02-04 ENCOUNTER — Telehealth: Payer: Self-pay | Admitting: Internal Medicine

## 2014-02-04 NOTE — Telephone Encounter (Signed)
Pt needs re-fill on lisinopril (PRINIVIL,ZESTRIL) 20 MG tablet [facility] [address]

## 2014-02-04 NOTE — Telephone Encounter (Signed)
Pt notified, rx sent in on 01-31-14

## 2014-02-09 ENCOUNTER — Other Ambulatory Visit (INDEPENDENT_AMBULATORY_CARE_PROVIDER_SITE_OTHER): Payer: Medicare Other

## 2014-02-09 ENCOUNTER — Ambulatory Visit (INDEPENDENT_AMBULATORY_CARE_PROVIDER_SITE_OTHER): Payer: Medicare Other | Admitting: Internal Medicine

## 2014-02-09 ENCOUNTER — Encounter: Payer: Self-pay | Admitting: Internal Medicine

## 2014-02-09 VITALS — BP 148/82 | HR 84 | Temp 97.9°F | Resp 16 | Ht 64.0 in | Wt 164.0 lb

## 2014-02-09 DIAGNOSIS — R300 Dysuria: Secondary | ICD-10-CM | POA: Insufficient documentation

## 2014-02-09 DIAGNOSIS — I10 Essential (primary) hypertension: Secondary | ICD-10-CM

## 2014-02-09 DIAGNOSIS — K2100 Gastro-esophageal reflux disease with esophagitis, without bleeding: Secondary | ICD-10-CM

## 2014-02-09 DIAGNOSIS — M479 Spondylosis, unspecified: Secondary | ICD-10-CM

## 2014-02-09 LAB — CBC WITH DIFFERENTIAL/PLATELET
Basophils Absolute: 0 10*3/uL (ref 0.0–0.1)
Basophils Relative: 0.4 % (ref 0.0–3.0)
EOS ABS: 0.1 10*3/uL (ref 0.0–0.7)
Eosinophils Relative: 2.2 % (ref 0.0–5.0)
HCT: 39.8 % (ref 36.0–46.0)
Hemoglobin: 13.3 g/dL (ref 12.0–15.0)
Lymphocytes Relative: 28.2 % (ref 12.0–46.0)
Lymphs Abs: 1.8 10*3/uL (ref 0.7–4.0)
MCHC: 33.3 g/dL (ref 30.0–36.0)
MCV: 87.9 fl (ref 78.0–100.0)
Monocytes Absolute: 0.5 10*3/uL (ref 0.1–1.0)
Monocytes Relative: 8.3 % (ref 3.0–12.0)
NEUTROS PCT: 60.9 % (ref 43.0–77.0)
Neutro Abs: 3.8 10*3/uL (ref 1.4–7.7)
Platelets: 388 10*3/uL (ref 150.0–400.0)
RBC: 4.53 Mil/uL (ref 3.87–5.11)
RDW: 14.4 % (ref 11.5–15.5)
WBC: 6.3 10*3/uL (ref 4.0–10.5)

## 2014-02-09 LAB — URINALYSIS, ROUTINE W REFLEX MICROSCOPIC
BILIRUBIN URINE: NEGATIVE
Hgb urine dipstick: NEGATIVE
Ketones, ur: NEGATIVE
LEUKOCYTES UA: NEGATIVE
Nitrite: NEGATIVE
PH: 6 (ref 5.0–8.0)
TOTAL PROTEIN, URINE-UPE24: NEGATIVE
Urine Glucose: NEGATIVE
Urobilinogen, UA: 0.2 (ref 0.0–1.0)
WBC, UA: NONE SEEN (ref 0–?)

## 2014-02-09 LAB — COMPREHENSIVE METABOLIC PANEL
ALK PHOS: 83 U/L (ref 39–117)
ALT: 11 U/L (ref 0–35)
AST: 19 U/L (ref 0–37)
Albumin: 3.7 g/dL (ref 3.5–5.2)
BILIRUBIN TOTAL: 0.4 mg/dL (ref 0.2–1.2)
BUN: 12 mg/dL (ref 6–23)
CO2: 25 mEq/L (ref 19–32)
CREATININE: 0.9 mg/dL (ref 0.4–1.2)
Calcium: 9 mg/dL (ref 8.4–10.5)
Chloride: 102 mEq/L (ref 96–112)
GFR: 72.84 mL/min (ref >60.00)
Glucose, Bld: 102 mg/dL — ABNORMAL HIGH (ref 70–99)
Potassium: 4.4 mEq/L (ref 3.5–5.1)
Sodium: 138 mEq/L (ref 135–145)
TOTAL PROTEIN: 7.1 g/dL (ref 6.0–8.3)

## 2014-02-09 LAB — LIPID PANEL
Cholesterol: 216 mg/dL — ABNORMAL HIGH (ref 0–200)
HDL: 57.6 mg/dL (ref >39.00)
LDL Cholesterol: 137 mg/dL — ABNORMAL HIGH (ref 0–99)
NonHDL: 158.4
Total CHOL/HDL Ratio: 4
Triglycerides: 107 mg/dL (ref 0.0–149.0)
VLDL: 21.4 mg/dL (ref 0.0–40.0)

## 2014-02-09 LAB — TSH: TSH: 1.38 u[IU]/mL (ref 0.35–4.50)

## 2014-02-09 MED ORDER — LISINOPRIL 20 MG PO TABS: 20.0000 mg | ORAL_TABLET | Freq: Every day | ORAL | Status: DC

## 2014-02-09 MED ORDER — PANTOPRAZOLE SODIUM 40 MG PO TBEC: 40.0000 mg | DELAYED_RELEASE_TABLET | Freq: Every day | ORAL | Status: DC

## 2014-02-09 MED ORDER — HYDRALAZINE HCL 50 MG PO TABS: 50.0000 mg | ORAL_TABLET | Freq: Three times a day (TID) | ORAL | Status: DC

## 2014-02-09 MED ORDER — CHLORTHALIDONE 25 MG PO TABS: 25.0000 mg | ORAL_TABLET | Freq: Every day | ORAL | Status: DC

## 2014-02-09 NOTE — Progress Notes (Signed)
Pre visit review using our clinic review tool, if applicable. No additional management support is needed unless otherwise documented below in the visit note. 

## 2014-02-09 NOTE — Patient Instructions (Signed)
Gastroesophageal Reflux Disease, Adult Gastroesophageal reflux disease (GERD) happens when acid from your stomach flows up into the esophagus. When acid comes in contact with the esophagus, the acid causes soreness (inflammation) in the esophagus. Over time, GERD may create small holes (ulcers) in the lining of the esophagus. CAUSES   Increased body weight. This puts pressure on the stomach, making acid rise from the stomach into the esophagus.  Smoking. This increases acid production in the stomach.  Drinking alcohol. This causes decreased pressure in the lower esophageal sphincter (valve or ring of muscle between the esophagus and stomach), allowing acid from the stomach into the esophagus.  Late evening meals and a full stomach. This increases pressure and acid production in the stomach.  A malformed lower esophageal sphincter. Sometimes, no cause is found. SYMPTOMS   Burning pain in the lower part of the mid-chest behind the breastbone and in the mid-stomach area. This may occur twice a week or more often.  Trouble swallowing.  Sore throat.  Dry cough.  Asthma-like symptoms including chest tightness, shortness of breath, or wheezing. DIAGNOSIS  Your caregiver may be able to diagnose GERD based on your symptoms. In some cases, X-rays and other tests may be done to check for complications or to check the condition of your stomach and esophagus. TREATMENT  Your caregiver may recommend over-the-counter or prescription medicines to help decrease acid production. Ask your caregiver before starting or adding any new medicines.  HOME CARE INSTRUCTIONS   Change the factors that you can control. Ask your caregiver for guidance concerning weight loss, quitting smoking, and alcohol consumption.  Avoid foods and drinks that make your symptoms worse, such as:  Caffeine or alcoholic drinks.  Chocolate.  Peppermint or mint flavorings.  Garlic and onions.  Spicy foods.  Citrus fruits,  such as oranges, lemons, or limes.  Tomato-based foods such as sauce, chili, salsa, and pizza.  Fried and fatty foods.  Avoid lying down for the 3 hours prior to your bedtime or prior to taking a nap.  Eat small, frequent meals instead of large meals.  Wear loose-fitting clothing. Do not wear anything tight around your waist that causes pressure on your stomach.  Raise the head of your bed 6 to 8 inches with wood blocks to help you sleep. Extra pillows will not help.  Only take over-the-counter or prescription medicines for pain, discomfort, or fever as directed by your caregiver.  Do not take aspirin, ibuprofen, or other nonsteroidal anti-inflammatory drugs (NSAIDs). SEEK IMMEDIATE MEDICAL CARE IF:   You have pain in your arms, neck, jaw, teeth, or back.  Your pain increases or changes in intensity or duration.  You develop nausea, vomiting, or sweating (diaphoresis).  You develop shortness of breath, or you faint.  Your vomit is green, yellow, black, or looks like coffee grounds or blood.  Your stool is red, bloody, or black. These symptoms could be signs of other problems, such as heart disease, gastric bleeding, or esophageal bleeding. MAKE SURE YOU:   Understand these instructions.  Will watch your condition.  Will get help right away if you are not doing well or get worse. Document Released: 02/27/2005 Document Revised: 08/12/2011 Document Reviewed: 12/07/2010 ExitCare Patient Information 2015 [employer]. This information is not intended to replace advice given to you by your health care provider. Make sure you discuss any questions you have with your health care provider.  

## 2014-02-10 ENCOUNTER — Encounter: Payer: Self-pay | Admitting: Internal Medicine

## 2014-02-10 NOTE — Assessment & Plan Note (Signed)
She does not have any s/s to suggest that she needs antibiotics

## 2014-02-10 NOTE — Assessment & Plan Note (Signed)
She has had a flare up of her symptoms since she ran out of the PPI 1 month ago Will restart the PPI

## 2014-02-10 NOTE — Assessment & Plan Note (Signed)
Her BP is well controlled Today I will monitor her lytes and renal function

## 2014-02-10 NOTE — Progress Notes (Signed)
   Subjective:    Patient ID: [name], female    DOB: [birth year], 80 y.o.   MRN: [id number]  Gastrophageal Reflux She complains of belching, dysphagia and heartburn. She reports no abdominal pain, no chest pain, no choking, no coughing, no early satiety, no globus sensation, no hoarse voice, no nausea, no sore throat, no stridor, no tooth decay, no water brash or no wheezing. This is a recurrent problem. The current episode started more than 1 year ago. The problem occurs occasionally. The problem has been unchanged. The heartburn duration is several minutes. The heartburn is located in the substernum. The heartburn is of mild intensity. The heartburn does not wake her from sleep. The heartburn does not limit her activity. The heartburn doesn't change with position. Nothing aggravates the symptoms. Pertinent negatives include no anemia, fatigue, melena, muscle weakness, orthopnea or weight loss. There are no known risk factors. She has tried a PPI for the symptoms. The treatment provided significant relief. Past procedures include an EGD.      Review of Systems  Constitutional: Negative.  Negative for fever, chills, weight loss, diaphoresis, activity change, appetite change, fatigue and unexpected weight change.  HENT: Positive for trouble swallowing. Negative for congestion, hoarse voice, sinus pressure and sore throat.   Eyes: Negative.   Respiratory: Negative.  Negative for cough, choking, chest tightness, shortness of breath, wheezing and stridor.   Cardiovascular: Negative.  Negative for chest pain, palpitations and leg swelling.  Gastrointestinal: Positive for heartburn, dysphagia and constipation. Negative for nausea, vomiting, abdominal pain, diarrhea, blood in stool, melena, abdominal distention, anal bleeding and rectal pain.  Endocrine: Negative.   Genitourinary: Negative.   Musculoskeletal: Negative.  Negative for muscle weakness.  Skin: Negative.  Negative for rash.    Allergic/Immunologic: Negative.   Neurological: Negative.  Negative for dizziness, tremors, syncope, light-headedness, numbness and headaches.  Hematological: Negative.  Negative for adenopathy. Does not bruise/bleed easily.  Psychiatric/Behavioral: Negative.        Objective:   Physical Exam  Vitals reviewed. Constitutional: She is oriented to person, place, and time. She appears well-developed and well-nourished. No distress.  HENT:  Head: Normocephalic and atraumatic.  Mouth/Throat: Oropharynx is clear and moist. No oropharyngeal exudate.  Eyes: Conjunctivae are normal. Right eye exhibits no discharge. Left eye exhibits no discharge. No scleral icterus.  Neck: Normal range of motion. Neck supple. No JVD present. No tracheal deviation present. No thyromegaly present.  Cardiovascular: Normal rate, regular rhythm, normal heart sounds and intact distal pulses.  Exam reveals no gallop and no friction rub.   No murmur heard. Pulmonary/Chest: Effort normal and breath sounds normal. No stridor. No respiratory distress. She has no wheezes. She has no rales. She exhibits no tenderness.  Abdominal: Soft. Bowel sounds are normal. She exhibits no distension and no mass. There is no tenderness. There is no rebound and no guarding.  Musculoskeletal: Normal range of motion. She exhibits no edema and no tenderness.  Lymphadenopathy:    She has no cervical adenopathy.  Neurological: She is oriented to person, place, and time.  Skin: Skin is warm and dry. No rash noted. She is not diaphoretic. No erythema. No pallor.  Psychiatric: She has a normal mood and affect. Her behavior is normal. Judgment and thought content normal.          Assessment & Plan:

## 2014-02-13 LAB — CULTURE, URINE COMPREHENSIVE: Colony Count: 40000

## 2014-02-15 ENCOUNTER — Other Ambulatory Visit: Payer: Self-pay | Admitting: Emergency Medicine

## 2014-02-18 DIAGNOSIS — Z853 Personal history of malignant neoplasm of breast: Secondary | ICD-10-CM | POA: Diagnosis not present

## 2014-02-18 DIAGNOSIS — Z1231 Encounter for screening mammogram for malignant neoplasm of breast: Secondary | ICD-10-CM | POA: Diagnosis not present

## 2014-02-21 DIAGNOSIS — H4011X0 Primary open-angle glaucoma, stage unspecified: Secondary | ICD-10-CM | POA: Diagnosis not present

## 2014-02-21 DIAGNOSIS — H409 Unspecified glaucoma: Secondary | ICD-10-CM | POA: Diagnosis not present

## 2014-02-21 DIAGNOSIS — Z961 Presence of intraocular lens: Secondary | ICD-10-CM | POA: Diagnosis not present

## 2014-02-22 ENCOUNTER — Telehealth: Payer: Self-pay | Admitting: *Deleted

## 2014-02-22 NOTE — Telephone Encounter (Signed)
Left msg on triage stating grandmother been rx pantoprazole, but she states the med makes her very sluggish, she is not able to sleep well. Wanting to know is this one of the side effects should she stop taking & takes something else...[name]

## 2014-02-22 NOTE — Telephone Encounter (Signed)
I don' think pantoprazole is causing her symptoms

## 2014-02-23 ENCOUNTER — Emergency Department (HOSPITAL_COMMUNITY): Payer: Medicare Other

## 2014-02-23 ENCOUNTER — Encounter (HOSPITAL_COMMUNITY): Payer: Self-pay | Admitting: Emergency Medicine

## 2014-02-23 ENCOUNTER — Inpatient Hospital Stay (HOSPITAL_COMMUNITY)
Admission: EM | Admit: 2014-02-23 | Discharge: 2014-02-25 | DRG: 308 | Disposition: A | Discharge status: Home / Self Care | Payer: Medicare Other | Attending: Internal Medicine | Admitting: Internal Medicine

## 2014-02-23 DIAGNOSIS — M479 Spondylosis, unspecified: Secondary | ICD-10-CM

## 2014-02-23 DIAGNOSIS — E43 Unspecified severe protein-calorie malnutrition: Secondary | ICD-10-CM | POA: Diagnosis present

## 2014-02-23 DIAGNOSIS — Z90710 Acquired absence of both cervix and uterus: Secondary | ICD-10-CM

## 2014-02-23 DIAGNOSIS — T502X5A Adverse effect of carbonic-anhydrase inhibitors, benzothiadiazides and other diuretics, initial encounter: Secondary | ICD-10-CM | POA: Diagnosis present

## 2014-02-23 DIAGNOSIS — Z853 Personal history of malignant neoplasm of breast: Secondary | ICD-10-CM

## 2014-02-23 DIAGNOSIS — K219 Gastro-esophageal reflux disease without esophagitis: Secondary | ICD-10-CM | POA: Diagnosis present

## 2014-02-23 DIAGNOSIS — R42 Dizziness and giddiness: Secondary | ICD-10-CM | POA: Diagnosis not present

## 2014-02-23 DIAGNOSIS — Z6827 Body mass index (BMI) 27.0-27.9, adult: Secondary | ICD-10-CM | POA: Diagnosis not present

## 2014-02-23 DIAGNOSIS — N179 Acute kidney failure, unspecified: Secondary | ICD-10-CM | POA: Diagnosis present

## 2014-02-23 DIAGNOSIS — I471 Supraventricular tachycardia, unspecified: Secondary | ICD-10-CM | POA: Diagnosis present

## 2014-02-23 DIAGNOSIS — Z885 Allergy status to narcotic agent status: Secondary | ICD-10-CM | POA: Diagnosis not present

## 2014-02-23 DIAGNOSIS — C50919 Malignant neoplasm of unspecified site of unspecified female breast: Secondary | ICD-10-CM | POA: Diagnosis not present

## 2014-02-23 DIAGNOSIS — I498 Other specified cardiac arrhythmias: Secondary | ICD-10-CM | POA: Diagnosis not present

## 2014-02-23 DIAGNOSIS — Z79899 Other long term (current) drug therapy: Secondary | ICD-10-CM | POA: Diagnosis not present

## 2014-02-23 DIAGNOSIS — M1990 Unspecified osteoarthritis, unspecified site: Secondary | ICD-10-CM | POA: Diagnosis present

## 2014-02-23 DIAGNOSIS — D0512 Intraductal carcinoma in situ of left breast: Secondary | ICD-10-CM | POA: Diagnosis present

## 2014-02-23 DIAGNOSIS — R300 Dysuria: Secondary | ICD-10-CM

## 2014-02-23 DIAGNOSIS — R0609 Other forms of dyspnea: Secondary | ICD-10-CM | POA: Diagnosis not present

## 2014-02-23 DIAGNOSIS — R6889 Other general symptoms and signs: Secondary | ICD-10-CM | POA: Diagnosis not present

## 2014-02-23 DIAGNOSIS — R0600 Dyspnea, unspecified: Secondary | ICD-10-CM | POA: Diagnosis present

## 2014-02-23 DIAGNOSIS — E876 Hypokalemia: Secondary | ICD-10-CM | POA: Diagnosis present

## 2014-02-23 DIAGNOSIS — Z9010 Acquired absence of unspecified breast and nipple: Secondary | ICD-10-CM | POA: Diagnosis not present

## 2014-02-23 DIAGNOSIS — H409 Unspecified glaucoma: Secondary | ICD-10-CM | POA: Diagnosis present

## 2014-02-23 DIAGNOSIS — Z8719 Personal history of other diseases of the digestive system: Secondary | ICD-10-CM

## 2014-02-23 DIAGNOSIS — N289 Disorder of kidney and ureter, unspecified: Secondary | ICD-10-CM | POA: Diagnosis not present

## 2014-02-23 DIAGNOSIS — Z9089 Acquired absence of other organs: Secondary | ICD-10-CM | POA: Diagnosis not present

## 2014-02-23 DIAGNOSIS — R0989 Other specified symptoms and signs involving the circulatory and respiratory systems: Secondary | ICD-10-CM

## 2014-02-23 DIAGNOSIS — I10 Essential (primary) hypertension: Secondary | ICD-10-CM | POA: Diagnosis not present

## 2014-02-23 LAB — BASIC METABOLIC PANEL
ANION GAP: 16 — AB (ref 5–15)
BUN: 43 mg/dL — AB (ref 6–23)
CO2: 22 meq/L (ref 19–32)
Calcium: 8.4 mg/dL (ref 8.4–10.5)
Chloride: 100 mEq/L (ref 96–112)
Creatinine, Ser: 1.42 mg/dL — ABNORMAL HIGH (ref 0.50–1.10)
GFR calc Af Amer: 36 mL/min — ABNORMAL LOW (ref >90)
GFR calc non Af Amer: 31 mL/min — ABNORMAL LOW (ref >90)
Glucose, Bld: 153 mg/dL — ABNORMAL HIGH (ref 70–99)
Potassium: 3.2 mEq/L — ABNORMAL LOW (ref 3.7–5.3)
Sodium: 138 mEq/L (ref 137–147)

## 2014-02-23 LAB — PROTIME-INR
INR: 1.03 (ref 0.00–1.49)
Prothrombin Time: 13.5 seconds (ref 11.6–15.2)

## 2014-02-23 LAB — CBC
HCT: 37.2 % (ref 36.0–46.0)
Hemoglobin: 12.4 g/dL (ref 12.0–15.0)
MCH: 29.1 pg (ref 26.0–34.0)
MCHC: 33.3 g/dL (ref 30.0–36.0)
MCV: 87.3 fL (ref 78.0–100.0)
Platelets: 330 10*3/uL (ref 150–400)
RBC: 4.26 MIL/uL (ref 3.87–5.11)
RDW: 13.7 % (ref 11.5–15.5)
WBC: 9.1 10*3/uL (ref 4.0–10.5)

## 2014-02-23 LAB — I-STAT TROPONIN, ED: TROPONIN I, POC: 0.02 ng/mL (ref 0.00–0.08)

## 2014-02-23 LAB — MAGNESIUM: Magnesium: 2.2 mg/dL (ref 1.5–2.5)

## 2014-02-23 LAB — PRO B NATRIURETIC PEPTIDE: Pro B Natriuretic peptide (BNP): 154.1 pg/mL (ref 0–450)

## 2014-02-23 LAB — TSH: TSH: 2.4 u[IU]/mL (ref 0.350–4.500)

## 2014-02-23 MED ORDER — ASPIRIN EC 81 MG PO TBEC
81.0000 mg | DELAYED_RELEASE_TABLET | Freq: Every day | ORAL | Status: DC
  Administered 2014-02-24 – 2014-02-25 (×2): 81 mg via ORAL
  Filled 2014-02-23 (×2): qty 1

## 2014-02-23 MED ORDER — METOPROLOL TARTRATE 1 MG/ML IV SOLN
5.0000 mg | Freq: Once | INTRAVENOUS | Status: AC
  Administered 2014-02-23: 5 mg via INTRAVENOUS

## 2014-02-23 MED ORDER — TRAVOPROST (BAK FREE) 0.004 % OP SOLN
1.0000 [drp] | Freq: Every day | OPHTHALMIC | Status: DC
  Administered 2014-02-24 (×2): 1 [drp] via OPHTHALMIC
  Filled 2014-02-23 (×2): qty 2.5

## 2014-02-23 MED ORDER — ESMOLOL HCL-SODIUM CHLORIDE 2000 MG/100ML IV SOLN
25.0000 ug/kg/min | INTRAVENOUS | Status: DC
  Administered 2014-02-23: 25.09 ug/kg/min via INTRAVENOUS
  Filled 2014-02-23: qty 100

## 2014-02-23 MED ORDER — METOPROLOL TARTRATE 1 MG/ML IV SOLN
INTRAVENOUS | Status: AC
  Filled 2014-02-23: qty 10

## 2014-02-23 MED ORDER — SODIUM CHLORIDE 0.9 % IV SOLN
INTRAVENOUS | Status: DC
  Administered 2014-02-23 – 2014-02-25 (×4): via INTRAVENOUS

## 2014-02-23 MED ORDER — POTASSIUM CHLORIDE CRYS ER 20 MEQ PO TBCR
40.0000 meq | EXTENDED_RELEASE_TABLET | Freq: Once | ORAL | Status: AC
  Administered 2014-02-23: 40 meq via ORAL
  Filled 2014-02-23: qty 2

## 2014-02-23 MED ORDER — TROLAMINE SALICYLATE 10 % EX CREA: 1.0000 "application " | TOPICAL_CREAM | CUTANEOUS | Status: DC | PRN

## 2014-02-23 MED ORDER — ONDANSETRON HCL 4 MG/2ML IJ SOLN: 4.0000 mg | Freq: Four times a day (QID) | INTRAMUSCULAR | Status: DC | PRN

## 2014-02-23 MED ORDER — TRAVOPROST 0.004 % OP SOLN: 1.0000 [drp] | Freq: Every day | OPHTHALMIC | Status: DC

## 2014-02-23 MED ORDER — ADENOSINE 6 MG/2ML IV SOLN
INTRAVENOUS | Status: AC
  Filled 2014-02-23: qty 4

## 2014-02-23 MED ORDER — MUSCLE RUB 10-15 % EX CREA
TOPICAL_CREAM | CUTANEOUS | Status: DC | PRN
  Filled 2014-02-23: qty 85

## 2014-02-23 MED ORDER — PANTOPRAZOLE SODIUM 40 MG PO TBEC
40.0000 mg | DELAYED_RELEASE_TABLET | Freq: Every day | ORAL | Status: DC
  Administered 2014-02-23 – 2014-02-25 (×3): 40 mg via ORAL
  Filled 2014-02-23 (×3): qty 1

## 2014-02-23 MED ORDER — ACETAMINOPHEN 325 MG PO TABS
325.0000 mg | ORAL_TABLET | Freq: Four times a day (QID) | ORAL | Status: DC | PRN
  Administered 2014-02-25: 325 mg via ORAL
  Filled 2014-02-23: qty 1

## 2014-02-23 MED ORDER — NITROGLYCERIN 0.4 MG SL SUBL: 0.4000 mg | SUBLINGUAL_TABLET | SUBLINGUAL | Status: DC | PRN

## 2014-02-23 MED ORDER — SODIUM CHLORIDE 0.9 % IJ SOLN: 3.0000 mL | Freq: Two times a day (BID) | INTRAMUSCULAR | Status: DC

## 2014-02-23 MED ORDER — ENOXAPARIN SODIUM 40 MG/0.4ML ~~LOC~~ SOLN
40.0000 mg | Freq: Every day | SUBCUTANEOUS | Status: DC
  Administered 2014-02-24 (×2): 40 mg via SUBCUTANEOUS
  Filled 2014-02-23 (×4): qty 0.4

## 2014-02-23 MED ORDER — ESMOLOL HCL-SODIUM CHLORIDE 2000 MG/100ML IV SOLN: 25.0000 ug/kg/min | INTRAVENOUS | Status: DC

## 2014-02-23 MED ORDER — ASPIRIN 81 MG PO CHEW
324.0000 mg | CHEWABLE_TABLET | ORAL | Status: AC
  Administered 2014-02-23: 324 mg via ORAL
  Filled 2014-02-23: qty 4

## 2014-02-23 MED ORDER — ASPIRIN 300 MG RE SUPP: 300.0000 mg | RECTAL | Status: AC

## 2014-02-23 NOTE — ED Notes (Addendum)
Report from [facility].  Pt reports dizziness since this morning.  Denied chest pain and sob with EMS.  Initially NSR with HR 81.  BP 78/ palpated.  Enroute to ED pt SVT with rate 150s.  Given 6mg  and 12mg  of Adenosine.  On arrival pt c/o dizziness and sob.  EDP at bedside on arrival and having pt do vagal maneuvers.  Pt converted to SR with vagal maneuvers and then back to SVT.

## 2014-02-23 NOTE — H&P (Deleted)
Patient ID: [name] MRN: [id number], DOB/AGE: 78-25-1923   Admit date: 02/23/2014   Primary Physician: Scarlette Calico, MD Primary Cardiologist: Dr Debara Pickett (new)  HPI: 80 y/o AA female, widowed, lives alone, no prior cardiac issues except for HTN- admitted through the ER with fatigue and SOB. She was found to be in an SVT. This rhythm would break with Adenosine but recur. Her B/P is borderline but otherwise she is tolerating this well. She denies any chest pain. She is admitted now for further evaluation. Other medical issues include breast cancer, s/p Lt lumpectomy in 2011, Lt mastectomy in 2012. She had a GI bleed Aug of 2014 secondary to NSAIDs.    Problem List: Past Medical History  Diagnosis Date  . Hypertension   . Cancer   . Glaucoma   . Allergy   . Arthritis   . Ulcer   . Complication of anesthesia     shortness of breath after procedure    Past Surgical History  Procedure Laterality Date  . Cholecystectomy    . Breast surgery  2011  . Breast surgery  2012    lt breast masty  . Abdominal hysterectomy    . Esophagogastroduodenoscopy N/A 01/25/2013    Procedure: ESOPHAGOGASTRODUODENOSCOPY (EGD);  Surgeon: Jerene Bears, MD;  Location: [place];  Service: Gastroenterology;  Laterality: N/A;     Allergies:  Allergies  Allergen Reactions  . Codeine     REACTION: causes nausea and vomiting     Home Medications Current Facility-Administered Medications  Medication Dose Route Frequency Provider Last Rate Last Dose  . adenosine (ADENOCARD) 6 MG/2ML injection           . esmolol (BREVIBLOC) 2000 mg / 100 mL (20 mg/mL) infusion  25-300 mcg/kg/min (Order-Specific) Intravenous Titrated Evelina Bucy, MD      . metoprolol (LOPRESSOR) 1 MG/ML injection            Current Outpatient Prescriptions  Medication Sig Dispense Refill  . acetaminophen (TYLENOL) 325 MG tablet Take 325 mg by mouth every 6 (six) hours as needed for pain.      . chlorthalidone (HYGROTON) 25  MG tablet Take 1 tablet (25 mg total) by mouth daily.  30 tablet  11  . furosemide (LASIX) 40 MG tablet Take 1 tablet by mouth daily.      . hydrALAZINE (APRESOLINE) 50 MG tablet Take 1 tablet (50 mg total) by mouth 3 (three) times daily.  90 tablet  11  . lisinopril (PRINIVIL,ZESTRIL) 20 MG tablet Take 1 tablet (20 mg total) by mouth daily.  30 tablet  11  . pantoprazole (PROTONIX) 40 MG tablet Take 1 tablet (40 mg total) by mouth daily.  30 tablet  11  . travoprost, benzalkonium, (TRAVATAN) 0.004 % ophthalmic solution Place 1 drop into both eyes at bedtime.      . trolamine salicylate (ASPERCREME) 10 % cream Apply 1 application topically as needed for muscle pain (knees, back).         FM Hx- Her father left the family after the 11th child was born. Her mother had an MI at age 47.    History   Social History  . Marital Status: Widowed    Spouse Name: N/A    Number of Children: 7  . Years of Education: 8   Occupational History  .     Social History Main Topics  . Smoking status: Never Smoker   . Smokeless tobacco: Never Used  .  Alcohol Use: No  . Drug Use: No  . Sexual Activity: No   Other Topics Concern  . Not on file   Social History Narrative   8th grade. Married '40 - 52 yrs/widowed. 5 sons, 2 daughters; 6 grandchildren; 12 great grands.   Lives alone and manages ADLs except driving. Family is very supportive. ACP -discussed with her and referred to "[employer]." (Oct '13)     Review of Systems: General: negative for chills, fever, night sweats or weight changes.  Cardiovascular: negative for chest pain, edema, orthopnea Dermatological: negative for rash Respiratory: negative for cough or wheezing Urologic: negative for hematuria Abdominal: negative for nausea, vomiting, diarrhea, bright red blood per rectum, melena, or hematemesis Neurologic: negative for visual changes, syncope, or dizziness All other systems reviewed and are otherwise negative  except as noted above.  Physical Exam: Blood pressure 111/65, pulse 101, resp. rate 26, SpO2 100.00%.  General appearance: alert, cooperative, no distress and poor dentition Neck: no carotid bruit and no JVD Lungs: clear to auscultation bilaterally Heart: irregularly irregular rhythm Abdomen: soft, non-tender; bowel sounds normal; no masses,  no organomegaly Extremities: extremities normal, atraumatic, no cyanosis or edema Pulses: diminnished distal pulses Skin: cool and dry Neurologic: Grossly normal    Labs:   Results for orders placed during the hospital encounter of 02/23/14 (from the past 24 hour(s))  CBC     Status: None   Collection Time    02/23/14  5:57 PM      Result Value Ref Range   WBC 9.1  4.0 - 10.5 K/uL   RBC 4.26  3.87 - 5.11 MIL/uL   Hemoglobin 12.4  12.0 - 15.0 g/dL   HCT 37.2  36.0 - 46.0 %   MCV 87.3  78.0 - 100.0 fL   MCH 29.1  26.0 - 34.0 pg   MCHC 33.3  30.0 - 36.0 g/dL   RDW 13.7  11.5 - 15.5 %   Platelets 330  150 - 400 K/uL  BASIC METABOLIC PANEL     Status: Abnormal   Collection Time    02/23/14  5:57 PM      Result Value Ref Range   Sodium 138  137 - 147 mEq/L   Potassium 3.2 (*) 3.7 - 5.3 mEq/L   Chloride 100  96 - 112 mEq/L   CO2 22  19 - 32 mEq/L   Glucose, Bld 153 (*) 70 - 99 mg/dL   BUN 43 (*) 6 - 23 mg/dL   Creatinine, Ser 1.42 (*) 0.50 - 1.10 mg/dL   Calcium 8.4  8.4 - 10.5 mg/dL   GFR calc non Af Amer 31 (*) >90 mL/min   GFR calc Af Amer 36 (*) >90 mL/min   Anion gap 16 (*) 5 - 15  PRO B NATRIURETIC PEPTIDE     Status: None   Collection Time    02/23/14  5:57 PM      Result Value Ref Range   Pro B Natriuretic peptide (BNP) 154.1  0 - 450 pg/mL  I-STAT TROPOININ, ED     Status: None   Collection Time    02/23/14  6:06 PM      Result Value Ref Range   Troponin i, poc 0.02  0.00 - 0.08 ng/mL   Comment 3              Radiology/Studies: Dg Chest Port 1 View  02/23/2014   CLINICAL DATA:  Dizziness, tachycardia, history of  hypertension  and breast malignancy  EXAM: PORTABLE CHEST - 1 VIEW  COMPARISON:  Portable chest x-ray of January 28, 2011  FINDINGS: The lungs are well-expanded and clear. The heart is normal in size. The pulmonary vascularity is not engorged. There is no pleural effusion. External pacing-defibrillation pads are present. The bony thorax is unremarkable. There is degenerative change of the glenohumeral joint on the right.  IMPRESSION: There is no evidence of acute cardiopulmonary abnormality.   Electronically Signed   By: David  Martinique   On: 02/23/2014 18:34    EKG:SVT 128  ASSESSMENT AND PLAN:  Principal Problem:   PSVT  Active Problems:   Dyspnea   HYPERTENSION   History of GI bleed- Aug 2014 (NSAIDs)   Acute renal insufficiency   GERD   Breast cancer- s/p mastectomy   DJD (degenerative joint disease)   PLAN: Admit to step down. Hydrate, hold Lasix and ACE. Beta blocker for rate control. Check echo when her HR is under better control. Replace K+.    Henri Medal, PA-C 02/23/2014, 6:57 PM

## 2014-02-23 NOTE — ED Notes (Addendum)
Pt in SVT on arrival.  Dr. Mingo Amber and resident at bedside on arrival.  Pt placed on zoll pads.

## 2014-02-23 NOTE — ED Notes (Signed)
Resident attempting 2nd IV via [place].

## 2014-02-23 NOTE — ED Notes (Addendum)
Dr. Mingo Amber notified cardiologist of pt.  Multiple attempts to have pt do vagal maneuvers to convert with initial success and then pt back to narrow complex tachycardia and episodes of SVT.

## 2014-02-23 NOTE — Telephone Encounter (Signed)
Called grand-daughter Louretta Shorten) no answer LMOM with md response...Johny Chess

## 2014-02-23 NOTE — ED Notes (Addendum)
Cardiologist at bedside. C/o sob.  Pulse ox 100% on 2L oxygen via [place].

## 2014-02-23 NOTE — H&P (Deleted)
Pt. Seen and examined. Agree with the NP/PA-C note as written.  Pleasant 80 yo female with subacute onset dyspnea, found to be in PSVT. Responded to adenosine twice, but continues to recur - suspect AVNRT. Lateral ST depression noted with tachycardia. No chest pain. Dyspnea is improved somewhat.  CXR clear. Would give IV Metoprolol now and then when available start IV b-blocker drip (esmolol). Labs indicate significant metabolic derangement that is likely driving her arrythmia, including acute renal failure, hypokalemia and acidosis. I would recommend medicine admission - we will follow closely in consultation.  Pixie Casino, MD, [facility] Attending Cardiologist [place]

## 2014-02-23 NOTE — ED Notes (Signed)
Family at bedside.  Updated by resident.

## 2014-02-23 NOTE — ED Notes (Signed)
Dr. Mingo Amber at bedside attempting 2nd IV via [place].

## 2014-02-23 NOTE — ED Notes (Signed)
Spoke with Dr. Manus Gunning concerning parameters for Esmolol due to HR and BP.  New orders received with parameters.

## 2014-02-23 NOTE — ED Provider Notes (Signed)
CSN: [id number]     Arrival date & time 02/23/14  1735 History   First MD Initiated Contact with Patient 02/23/14 1736     Chief Complaint  Patient presents with  . Dizziness  . Tachycardia     Patient is a 80 y.o. female presenting with palpitations. The history is provided by the patient.  Palpitations Palpitations quality:  Fast Onset quality:  Gradual Duration: early AM. Timing:  Constant Chronicity:  New Context: not anxiety, not caffeine, not illicit drugs and not nicotine   Associated symptoms: chest pain (discomfort, relieves when in sinus per EMS, then returns when fast), chest pressure, dizziness and shortness of breath   Associated symptoms: no back pain, no cough, no diaphoresis, no hemoptysis, no lower extremity edema, no nausea, no near-syncope, no numbness, no syncope and no vomiting   Risk factors: no diabetes mellitus, no heart disease, no hx of PE and no hyperthyroidism    Per EMS on arrival NSR in 80s and said chest [pain improved then returned when in narrow complex tachycardia. Given 6 and 12 mg adenosine with conversion but transient for a few seconds.  BP 80/palp.  PIV right forearm 20g, IVF running but slow. Pt denies hx of such  Past Medical History  Diagnosis Date  . Hypertension   . Cancer   . Glaucoma   . Allergy   . Arthritis   . Ulcer   . Complication of anesthesia     shortness of breath after procedure   Past Surgical History  Procedure Laterality Date  . Cholecystectomy    . Breast surgery  2011  . Breast surgery  2012    lt breast masty  . Abdominal hysterectomy    . Esophagogastroduodenoscopy N/A 01/25/2013    Procedure: ESOPHAGOGASTRODUODENOSCOPY (EGD);  Surgeon: Jerene Bears, MD;  Location: [place];  Service: Gastroenterology;  Laterality: N/A;   No family history on file. History  Substance Use Topics  . Smoking status: Never Smoker   . Smokeless tobacco: Never Used  . Alcohol Use: No   OB History   Grav Para Term Preterm  Abortions TAB SAB Ect Mult Living                 Review of Systems  Constitutional: Negative for diaphoresis.  Respiratory: Positive for shortness of breath. Negative for cough and hemoptysis.   Cardiovascular: Positive for chest pain (discomfort, relieves when in sinus per EMS, then returns when fast) and palpitations. Negative for syncope and near-syncope.  Gastrointestinal: Negative for nausea and vomiting.  Musculoskeletal: Negative for back pain.  Neurological: Positive for dizziness. Negative for numbness.    Allergies  Codeine  Home Medications   Prior to Admission medications   Medication Sig Start Date End Date Taking? Authorizing Provider  acetaminophen (TYLENOL) 325 MG tablet Take 325 mg by mouth every 6 (six) hours as needed for pain.    Historical Provider, MD  chlorthalidone (HYGROTON) 25 MG tablet Take 1 tablet (25 mg total) by mouth daily. 02/09/14   Janith Lima, MD  hydrALAZINE (APRESOLINE) 50 MG tablet Take 1 tablet (50 mg total) by mouth 3 (three) times daily. 02/09/14   Janith Lima, MD  lisinopril (PRINIVIL,ZESTRIL) 20 MG tablet Take 1 tablet (20 mg total) by mouth daily. 02/09/14   Janith Lima, MD  pantoprazole (PROTONIX) 40 MG tablet Take 1 tablet (40 mg total) by mouth daily. 02/09/14   Janith Lima, MD  travoprost, benzalkonium, (TRAVATAN) 0.004 % ophthalmic  solution Place 1 drop into both eyes at bedtime.    Historical Provider, MD   BP 102/54  Pulse 59  Resp 22  SpO2 100% Physical Exam  Nursing note and vitals reviewed. Constitutional: She is oriented to person, place, and time. She appears well-developed and well-nourished. No distress.  HENT:  Head: Normocephalic and atraumatic.  Nose: Nose normal.  Eyes: Conjunctivae are normal.  Neck: Normal range of motion. Neck supple. No tracheal deviation present.  Cardiovascular: Regular rhythm, normal heart sounds and intact distal pulses.   No murmur heard. Tachy 150, radial pulses palpable equal   Pulmonary/Chest: Effort normal and breath sounds normal. No respiratory distress. She has no rales.  Abdominal: Soft. Bowel sounds are normal. She exhibits no distension and no mass. There is no tenderness.  Musculoskeletal: Normal range of motion. She exhibits no edema.  Neurological: She is alert and oriented to person, place, and time.  Skin: Skin is warm and dry. No rash noted.  Psychiatric: She has a normal mood and affect.    ED Course  Procedures (including critical care time) Labs Review Labs Reviewed  BASIC METABOLIC PANEL - Abnormal; Notable for the following:    Potassium 3.2 (*)    Glucose, Bld 153 (*)    BUN 43 (*)    Creatinine, Ser 1.42 (*)    GFR calc non Af Amer 31 (*)    GFR calc Af Amer 36 (*)    Anion gap 16 (*)    All other components within normal limits  CBC  PRO B NATRIURETIC PEPTIDE  TSH  MAGNESIUM  PROTIME-INR  BASIC METABOLIC PANEL  CBC  CREATININE, SERUM  CBC  I-STAT TROPOININ, ED    Imaging Review Dg Chest Port 1 View  02/23/2014   CLINICAL DATA:  Dizziness, tachycardia, history of hypertension and breast malignancy  EXAM: PORTABLE CHEST - 1 VIEW  COMPARISON:  Portable chest x-ray of January 28, 2011  FINDINGS: The lungs are well-expanded and clear. The heart is normal in size. The pulmonary vascularity is not engorged. There is no pleural effusion. External pacing-defibrillation pads are present. The bony thorax is unremarkable. There is degenerative change of the glenohumeral joint on the right.  IMPRESSION: There is no evidence of acute cardiopulmonary abnormality.   Electronically Signed   By: David  Martinique   On: 02/23/2014 18:34     EKG Interpretation   Date/Time:  Wednesday February 23 2014 18:05:47 EDT Ventricular Rate:  128 PR Interval:  57 QRS Duration: 92 QT Interval:  335 QTC Calculation: 489 R Axis:   -74 Text Interpretation:  Ectopic atrial tachycardia, unifocal Inferior  infarct, old Anterolateral infarct, age indeterminate  Different from  previous earlier this visit Confirmed by [facility]  MD, [place] [phone]) on  02/23/2014 6:52:28 PM      MDM   Final diagnoses:  SVT (supraventricular tachycardia)   Pt presents in narrow complex regular tachycardia. BP labile but mostly hypotensive, mentating well.  Transiently converts to NSR with vagal maneuvers and often spontaneously but never last more than a few seconds.  Adenosine 6 then 12 with same response per EMS.  PIVx2 established. Pads placed.  CXR unremarkable.  EKG ST depressions during tachycardia in precordial leads but no reciprocal changes on right sided EKG and only rate related.  Trop negative.  Cards at bedside. Started esmolol drip  8:53 PM Feeling much better. NSR. AKI noted on labs   Filed Vitals:   02/23/14 1945 02/23/14 2005 02/23/14 2007 02/23/14  2010  BP: 113/59 103/60 103/51 102/54  Pulse: 64 62 58 59  Resp: 19 21 21 22   SpO2: 100% 100% 100% 100%   Admit to step down unit, hospitlaist service. Cards following.   Tammy Sours, MD 02/24/14 [phone]

## 2014-02-23 NOTE — Consult Note (Signed)
Patient ID: [name]  MRN: [id number], DOB/AGE: Apr 23, 1922   Admit date: 02/23/2014  Primary Physician: Scarlette Calico, MD  Primary Cardiologist: Dr Debara Pickett (new)   HPI: 80 y/o AA female, widowed, lives alone, no prior cardiac issues except for HTN- admitted through the ER with fatigue and SOB. She was found to be in an SVT. This rhythm would break with Adenosine but recur. Her B/P is borderline but otherwise she is tolerating this well. She denies any chest pain. She is admitted now for further evaluation. Other medical issues include breast cancer, s/p Lt lumpectomy in 2011, Lt mastectomy in 2012. She had a GI bleed Aug of 2014 secondary to NSAIDs.   Problem List:  Past Medical History   Diagnosis  Date   .  Hypertension    .  Cancer    .  Glaucoma    .  Allergy    .  Arthritis    .  Ulcer    .  Complication of anesthesia      shortness of breath after procedure    Past Surgical History   Procedure  Laterality  Date   .  Cholecystectomy     .  Breast surgery   2011   .  Breast surgery   2012     lt breast masty   .  Abdominal hysterectomy     .  Esophagogastroduodenoscopy  N/A  01/25/2013     Procedure: ESOPHAGOGASTRODUODENOSCOPY (EGD); Surgeon: Jerene Bears, MD; Location: [facility]; Service: Gastroenterology; Laterality: N/A;    Allergies:  Allergies   Allergen  Reactions   .  Codeine      REACTION: causes nausea and vomiting    Home Medications  Current Facility-Administered Medications   Medication  Dose  Route  Frequency  Provider  Last Rate  Last Dose   .  adenosine (ADENOCARD) 6 MG/2ML injection         .  esmolol (BREVIBLOC) 2000 mg / 100 mL (20 mg/mL) infusion  25-300 mcg/kg/min (Order-Specific)  Intravenous  Titrated  Evelina Bucy, MD     .  metoprolol (LOPRESSOR) 1 MG/ML injection          Current Outpatient Prescriptions   Medication  Sig  Dispense  Refill   .  acetaminophen (TYLENOL) 325 MG tablet  Take 325 mg by mouth every 6 (six) hours as  needed for pain.     .  chlorthalidone (HYGROTON) 25 MG tablet  Take 1 tablet (25 mg total) by mouth daily.  30 tablet  11   .  furosemide (LASIX) 40 MG tablet  Take 1 tablet by mouth daily.     .  hydrALAZINE (APRESOLINE) 50 MG tablet  Take 1 tablet (50 mg total) by mouth 3 (three) times daily.  90 tablet  11   .  lisinopril (PRINIVIL,ZESTRIL) 20 MG tablet  Take 1 tablet (20 mg total) by mouth daily.  30 tablet  11   .  pantoprazole (PROTONIX) 40 MG tablet  Take 1 tablet (40 mg total) by mouth daily.  30 tablet  11   .  travoprost, benzalkonium, (TRAVATAN) 0.004 % ophthalmic solution  Place 1 drop into both eyes at bedtime.     .  trolamine salicylate (ASPERCREME) 10 % cream  Apply 1 application topically as needed for muscle pain (knees, back).      FM Hx- Her father left the family after the 11th child was born. Her mother had an  MI at age 36.  History    Social History   .  Marital Status:  Widowed     Spouse Name:  N/A     Number of Children:  7   .  Years of Education:  8    Occupational History   .      Social History Main Topics   .  Smoking status:  Never Smoker   .  Smokeless tobacco:  Never Used   .  Alcohol Use:  No   .  Drug Use:  No   .  Sexual Activity:  No    Other Topics  Concern   .  Not on file    Social History Narrative    8th grade. Married '40 - 52 yrs/widowed. 5 sons, 2 daughters; 6 grandchildren; 12 great grands.    Lives alone and manages ADLs except driving. Family is very supportive. ACP -discussed with her and referred to "[employer]. Org." (Oct '13)    Review of Systems:  General: negative for chills, fever, night sweats or weight changes.  Cardiovascular: negative for chest pain, edema, orthopnea  Dermatological: negative for rash  Respiratory: negative for cough or wheezing  Urologic: negative for hematuria  Abdominal: negative for nausea, vomiting, diarrhea, bright red blood per rectum, melena, or hematemesis  Neurologic:  negative for visual changes, syncope, or dizziness  All other systems reviewed and are otherwise negative except as noted above.   Physical Exam:  Blood pressure 111/65, pulse 101, resp. rate 26, SpO2 100.00%.  General appearance: alert, cooperative, no distress and poor dentition  Neck: no carotid bruit and no JVD  Lungs: clear to auscultation bilaterally  Heart: irregularly irregular rhythm  Abdomen: soft, non-tender; bowel sounds normal; no masses, no organomegaly  Extremities: extremities normal, atraumatic, no cyanosis or edema  Pulses: diminnished distal pulses  Skin: cool and dry  Neurologic: Grossly normal   Labs:  Results for orders placed during the hospital encounter of 02/23/14 (from the past 24 hour(s))   CBC Status: None    Collection Time    02/23/14 5:57 PM   Result  Value  Ref Range    WBC  9.1  4.0 - 10.5 K/uL    RBC  4.26  3.87 - 5.11 MIL/uL    Hemoglobin  12.4  12.0 - 15.0 g/dL    HCT  37.2  36.0 - 46.0 %    MCV  87.3  78.0 - 100.0 fL    MCH  29.1  26.0 - 34.0 pg    MCHC  33.3  30.0 - 36.0 g/dL    RDW  13.7  11.5 - 15.5 %    Platelets  330  150 - 400 K/uL   BASIC METABOLIC PANEL Status: Abnormal    Collection Time    02/23/14 5:57 PM   Result  Value  Ref Range    Sodium  138  137 - 147 mEq/L    Potassium  3.2 (*)  3.7 - 5.3 mEq/L    Chloride  100  96 - 112 mEq/L    CO2  22  19 - 32 mEq/L    Glucose, Bld  153 (*)  70 - 99 mg/dL    BUN  43 (*)  6 - 23 mg/dL    Creatinine, Ser  1.42 (*)  0.50 - 1.10 mg/dL    Calcium  8.4  8.4 - 10.5 mg/dL    GFR calc non Af Amer  31 (*)  >  90 mL/min    GFR calc Af Amer  36 (*)  >90 mL/min    Anion gap  16 (*)  5 - 15   PRO B NATRIURETIC PEPTIDE Status: None    Collection Time    02/23/14 5:57 PM   Result  Value  Ref Range    Pro B Natriuretic peptide (BNP)  154.1  0 - 450 pg/mL   I-STAT TROPOININ, ED Status: None    Collection Time    02/23/14 6:06 PM   Result  Value  Ref Range    Troponin i, poc  0.02  0.00 -  0.08 ng/mL    Comment 3      Radiology/Studies: Dg Chest Port 1 View  02/23/2014 CLINICAL DATA: Dizziness, tachycardia, history of hypertension and breast malignancy EXAM: PORTABLE CHEST - 1 VIEW COMPARISON: Portable chest x-ray of January 28, 2011 FINDINGS: The lungs are well-expanded and clear. The heart is normal in size. The pulmonary vascularity is not engorged. There is no pleural effusion. External pacing-defibrillation pads are present. The bony thorax is unremarkable. There is degenerative change of the glenohumeral joint on the right. IMPRESSION: There is no evidence of acute cardiopulmonary abnormality. Electronically Signed By: David Martinique On: 02/23/2014 18:34   EKG:SVT 128   ASSESSMENT AND PLAN:  Principal Problem:  PSVT  Active Problems:  Dyspnea  HYPERTENSION  History of GI bleed- Aug 2014 (NSAIDs)  Acute renal insufficiency  GERD  Breast cancer- s/p mastectomy  DJD (degenerative joint disease)   PLAN: Admit to step down. Hydrate, hold Lasix and ACE. Beta blocker for rate control. Check echo when her HR is under better control. Replace K+.   Signed,  Erlene Quan, PA-C  Pt. Seen and examined. Agree with the NP/PA-C note as written. Pleasant 80 yo female with subacute onset dyspnea, found to be in PSVT. Responded to adenosine twice, but continues to recur - suspect AVNRT. Lateral ST depression noted with tachycardia. No chest pain. Dyspnea is improved somewhat. CXR clear. Would give IV Metoprolol now and then when available start IV b-blocker drip (esmolol). Labs indicate significant metabolic derangement that is likely driving her arrythmia, including acute renal failure, hypokalemia and acidosis. I would recommend medicine admission - we will follow closely in consultation.   Pixie Casino, MD, [facility]  Attending Cardiologist  [place]

## 2014-02-23 NOTE — H&P (Signed)
[facility]  History and Physical Kattie Santoyo L. Ardeth Perfect, MD Pager [phone] (if 7P to 7A, page night hospitalist on [email or web address])  ANANDI ABRAMO HYI:[id number] DOB: 06-25-1921 DOA: 02/23/2014  Referring physician: ED  PCP: Scarlette Calico, MD   Chief Complaint: SVT   HPI:  Pt w/ PMH of HTN presents after 2 episodes today resulted in presyncope, palpitations, dizziness. In the ED found to be in SVT. Adenosine failed to break in the EMS xport but metop in the ED broke the rhythm. She will be placed on esmolol ggt for rate control, and admitted to SDU. Cardiology will be the primary care team for the SVT. However, given her mild renal impairment and hypokalemia , [facility] was consulted to admit . We will care for these conditions   Chart Review:  Ed notes   Review of Systems:  Neg except as noted above   Past Medical History  Diagnosis Date  . Hypertension   . Cancer   . Glaucoma   . Allergy   . Arthritis   . Ulcer   . Complication of anesthesia     shortness of breath after procedure    Past Surgical History  Procedure Laterality Date  . Cholecystectomy    . Breast surgery  2011  . Breast surgery  2012    lt breast masty  . Abdominal hysterectomy    . Esophagogastroduodenoscopy N/A 01/25/2013    Procedure: ESOPHAGOGASTRODUODENOSCOPY (EGD);  Surgeon: Jerene Bears, MD;  Location: [facility];  Service: Gastroenterology;  Laterality: N/A;    Social History:  reports that she has never smoked. She has never used smokeless tobacco. She reports that she does not drink alcohol or use illicit drugs.  Allergies  Allergen Reactions  . Codeine     REACTION: causes nausea and vomiting    Family History  Problem Relation Age of Onset  . Coronary artery disease Mother 35    MI     Prior to Admission medications   Medication Sig Start Date End Date Taking? Authorizing Provider  acetaminophen (TYLENOL) 325 MG tablet Take 325 mg by mouth every 6 (six) hours as needed for pain.   Yes  Historical Provider, MD  chlorthalidone (HYGROTON) 25 MG tablet Take 1 tablet (25 mg total) by mouth daily. 02/09/14  Yes Janith Lima, MD  furosemide (LASIX) 40 MG tablet Take 1 tablet by mouth daily. 01/31/14  Yes Historical Provider, MD  hydrALAZINE (APRESOLINE) 50 MG tablet Take 1 tablet (50 mg total) by mouth 3 (three) times daily. 02/09/14  Yes Janith Lima, MD  lisinopril (PRINIVIL,ZESTRIL) 20 MG tablet Take 1 tablet (20 mg total) by mouth daily. 02/09/14  Yes Janith Lima, MD  pantoprazole (PROTONIX) 40 MG tablet Take 1 tablet (40 mg total) by mouth daily. 02/09/14  Yes Janith Lima, MD  travoprost, benzalkonium, (TRAVATAN) 0.004 % ophthalmic solution Place 1 drop into both eyes at bedtime.   Yes Historical Provider, MD  trolamine salicylate (ASPERCREME) 10 % cream Apply 1 application topically as needed for muscle pain (knees, back).   Yes Historical Provider, MD   Physical Exam: Filed Vitals:   02/23/14 1939 02/23/14 1940 02/23/14 1942 02/23/14 1945  BP: 113/66 97/60 102/62 113/59  Pulse: 66 64 68 64  Resp: 23 19 23 19   SpO2: 95% 100% 100% 100%     General:  AAF in NAD   HEENT: dry MM   Cardiovascular: RRR , no MRG   Respiratory: CTAB, no wr  Abdomen: soft, NT   Skin: warm , dry   Musculoskeletal: no focal deficits   Psychiatric: no anxiety, depression   Neurologic: no focal deficits   Wt Readings from Last 3 Encounters:  02/09/14 164 lb (74.39 kg)  08/11/13 167 lb 6.4 oz (75.932 kg)  04/06/13 168 lb 12.8 oz (76.567 kg)    Labs on Admission:  Basic Metabolic Panel:  Recent Labs Lab 02/23/14 1757  NA 138  K 3.2*  CL 100  CO2 22  GLUCOSE 153*  BUN 43*  CREATININE 1.42*  CALCIUM 8.4    Liver Function Tests: No results found for this basename: AST, ALT, ALKPHOS, BILITOT, PROT, ALBUMIN,  in the last 168 hours No results found for this basename: LIPASE, AMYLASE,  in the last 168 hours No results found for this basename: AMMONIA,  in the last 168  hours  CBC:  Recent Labs Lab 02/23/14 1757  WBC 9.1  HGB 12.4  HCT 37.2  MCV 87.3  PLT 330    Cardiac Enzymes: No results found for this basename: CKTOTAL, CKMB, CKMBINDEX, TROPONINI,  in the last 168 hours  Troponin (Point of Care Test)  Recent Labs  02/23/14 1806  TROPIPOC 0.02    BNP (last 3 results)  Recent Labs  02/23/14 1757  PROBNP 154.1    CBG: No results found for this basename: GLUCAP,  in the last 168 hours   Radiological Exams on Admission: Dg Chest Port 1 View  02/23/2014   CLINICAL DATA:  Dizziness, tachycardia, history of hypertension and breast malignancy  EXAM: PORTABLE CHEST - 1 VIEW  COMPARISON:  Portable chest x-ray of January 28, 2011  FINDINGS: The lungs are well-expanded and clear. The heart is normal in size. The pulmonary vascularity is not engorged. There is no pleural effusion. External pacing-defibrillation pads are present. The bony thorax is unremarkable. There is degenerative change of the glenohumeral joint on the right.  IMPRESSION: There is no evidence of acute cardiopulmonary abnormality.   Electronically Signed   By: David  Martinique   On: 02/23/2014 18:34    EKG: Independently reviewed. Now in sinus rhythm on monitor    Principal Problem:   PSVT  Active Problems:   HYPERTENSION   GERD   Breast cancer- s/p mastectomy   DJD (degenerative joint disease)   Dyspnea   History of GI bleed- Aug 2014 (NSAIDs)   Acute renal insufficiency   SVT (supraventricular tachycardia)   Assessment/Plan 1. SVT - on esmolol ggt. Plan per cards  2. HTN - home meds  3. AKI - likely prerenal . Hydrate w/ NS  4. Hypokalemia - replete and recheck in AM   Code Status: full Family Communication: family at bedside  Disposition Plan/Anticipated LOS: 3-5 days   Time spent: 45 minutes  Velna Hatchet, MD  Internal Medicine Pager [phone] If 7PM-7AM, please contact night-coverage at [email or web address], password [facility] 02/23/2014, 7:55 PM

## 2014-02-24 DIAGNOSIS — E43 Unspecified severe protein-calorie malnutrition: Secondary | ICD-10-CM | POA: Insufficient documentation

## 2014-02-24 DIAGNOSIS — I10 Essential (primary) hypertension: Secondary | ICD-10-CM

## 2014-02-24 LAB — CBC
HEMATOCRIT: 36.7 % (ref 36.0–46.0)
Hemoglobin: 12.2 g/dL (ref 12.0–15.0)
MCH: 28.6 pg (ref 26.0–34.0)
MCHC: 33.2 g/dL (ref 30.0–36.0)
MCV: 85.9 fL (ref 78.0–100.0)
Platelets: 346 10*3/uL (ref 150–400)
RBC: 4.27 MIL/uL (ref 3.87–5.11)
RDW: 13.9 % (ref 11.5–15.5)
WBC: 8 10*3/uL (ref 4.0–10.5)

## 2014-02-24 LAB — BASIC METABOLIC PANEL
Anion gap: 13 (ref 5–15)
BUN: 42 mg/dL — AB (ref 6–23)
CALCIUM: 8.5 mg/dL (ref 8.4–10.5)
CO2: 24 mEq/L (ref 19–32)
Chloride: 105 mEq/L (ref 96–112)
Creatinine, Ser: 1.31 mg/dL — ABNORMAL HIGH (ref 0.50–1.10)
GFR calc Af Amer: 40 mL/min — ABNORMAL LOW (ref >90)
GFR calc non Af Amer: 35 mL/min — ABNORMAL LOW (ref >90)
GLUCOSE: 85 mg/dL (ref 70–99)
Potassium: 4.2 mEq/L (ref 3.7–5.3)
Sodium: 142 mEq/L (ref 137–147)

## 2014-02-24 LAB — MRSA PCR SCREENING: MRSA by PCR: NEGATIVE

## 2014-02-24 MED ORDER — METOPROLOL TARTRATE 25 MG PO TABS
25.0000 mg | ORAL_TABLET | Freq: Two times a day (BID) | ORAL | Status: DC
  Administered 2014-02-24 – 2014-02-25 (×2): 25 mg via ORAL
  Filled 2014-02-24 (×3): qty 1

## 2014-02-24 MED ORDER — METOPROLOL TARTRATE 25 MG PO TABS: 25.0000 mg | ORAL_TABLET | Freq: Two times a day (BID) | ORAL | Status: DC

## 2014-02-24 MED ORDER — SODIUM CHLORIDE 0.9 % IJ SOLN
3.0000 mL | Freq: Two times a day (BID) | INTRAMUSCULAR | Status: DC
  Administered 2014-02-24: 3 mL via INTRAVENOUS

## 2014-02-24 MED ORDER — BOOST / RESOURCE BREEZE PO LIQD
1.0000 | Freq: Three times a day (TID) | ORAL | Status: DC
  Administered 2014-02-24 – 2014-02-25 (×2): 1 via ORAL

## 2014-02-24 NOTE — Consult Note (Signed)
Patient ID: [name]  MRN: [id number], DOB/AGE: [birth year]   Admit date: 02/23/2014  Primary Physician: Scarlette Calico, MD  Primary Cardiologist: Dr Debara Pickett (new)   HPI: 80 y/o AA female, widowed, lives alone, no prior cardiac issues except for HTN- admitted through the ER with fatigue and SOB.   Her PO appetite has been poor for the past several weeks.   In reviewing her chart, it became evident that she was on 2 diuretics .   She has been on Lasix for a long time and then had chlorthalidone added.  The Lasix was supposed to be stopped but she kept taking it.  After that, she had poor PO intake, was admitted with SVT, volume depletion.    Her BUN / creatinine ratio suggest dehydration.   Problem List:  Past Medical History   Diagnosis  Date   .  Hypertension    .  Cancer    .  Glaucoma    .  Allergy    .  Arthritis    .  Ulcer    .  Complication of anesthesia      shortness of breath after procedure    Past Surgical History   Procedure  Laterality  Date   .  Cholecystectomy     .  Breast surgery   2011   .  Breast surgery   2012     lt breast masty   .  Abdominal hysterectomy     .  Esophagogastroduodenoscopy  N/A  01/25/2013     Procedure: ESOPHAGOGASTRODUODENOSCOPY (EGD); Surgeon: Jerene Bears, MD; Location: [facility]; Service: Gastroenterology; Laterality: N/A;    Allergies:  Allergies   Allergen  Reactions   .  Codeine      REACTION: causes nausea and vomiting    Home Medications  Current Facility-Administered Medications   Medication  Dose  Route  Frequency  Provider  Last Rate  Last Dose   .  adenosine (ADENOCARD) 6 MG/2ML injection         .  esmolol (BREVIBLOC) 2000 mg / 100 mL (20 mg/mL) infusion  25-300 mcg/kg/min (Order-Specific)  Intravenous  Titrated  Evelina Bucy, MD     .  metoprolol (LOPRESSOR) 1 MG/ML injection          Current Outpatient Prescriptions   Medication  Sig  Dispense  Refill   .  acetaminophen (TYLENOL) 325 MG tablet   Take 325 mg by mouth every 6 (six) hours as needed for pain.     .  chlorthalidone (HYGROTON) 25 MG tablet  Take 1 tablet (25 mg total) by mouth daily.  30 tablet  11   .  furosemide (LASIX) 40 MG tablet  Take 1 tablet by mouth daily.     .  hydrALAZINE (APRESOLINE) 50 MG tablet  Take 1 tablet (50 mg total) by mouth 3 (three) times daily.  90 tablet  11   .  lisinopril (PRINIVIL,ZESTRIL) 20 MG tablet  Take 1 tablet (20 mg total) by mouth daily.  30 tablet  11   .  pantoprazole (PROTONIX) 40 MG tablet  Take 1 tablet (40 mg total) by mouth daily.  30 tablet  11   .  travoprost, benzalkonium, (TRAVATAN) 0.004 % ophthalmic solution  Place 1 drop into both eyes at bedtime.     .  trolamine salicylate (ASPERCREME) 10 % cream  Apply 1 application topically as needed for muscle pain (knees, back).  FM Hx- Her father left the family after the 11th child was born. Her mother had an MI at age 58.  History    Social History   .  Marital Status:  Widowed     Spouse Name:  N/A     Number of Children:  7   .  Years of Education:  8    Occupational History   .      Social History Main Topics   .  Smoking status:  Never Smoker   .  Smokeless tobacco:  Never Used   .  Alcohol Use:  No   .  Drug Use:  No   .  Sexual Activity:  No    Other Topics  Concern   .  Not on file    Social History Narrative    8th grade. Married '40 - 52 yrs/widowed. 5 sons, 2 daughters; 6 grandchildren; 12 great grands.    Lives alone and manages ADLs except driving. Family is very supportive. ACP -discussed with her and referred to "The conversation Project. Org." (Oct '13)     Physical Exam:  BP 121/55  Pulse 76  Temp(Src) 98.1 F (36.7 C) (Oral)  Resp 24  Ht 5\' 4"  (1.626 m)  Wt 162 lb 11.2 oz (73.8 kg)  BMI 27.91 kg/m2  SpO2 100% General appearance: alert, cooperative, no distress and poor dentition  Neck: no carotid bruit and no JVD  Lungs: clear to auscultation bilaterally  Heart: irregularly irregular  rhythm  Abdomen: soft, non-tender; bowel sounds normal; no masses, no organomegaly  Extremities: extremities normal, atraumatic, no cyanosis or edema  Pulses: diminnished distal pulses  Skin: cool and dry  Neurologic: Grossly normal   Labs:  Results for orders placed during the hospital encounter of 02/23/14 (from the past 24 hour(s))   CBC Status: None    Collection Time    02/23/14 5:57 PM   Result  Value  Ref Range    WBC  9.1  4.0 - 10.5 K/uL    RBC  4.26  3.87 - 5.11 MIL/uL    Hemoglobin  12.4  12.0 - 15.0 g/dL    HCT  37.2  36.0 - 46.0 %    MCV  87.3  78.0 - 100.0 fL    MCH  29.1  26.0 - 34.0 pg    MCHC  33.3  30.0 - 36.0 g/dL    RDW  13.7  11.5 - 15.5 %    Platelets  330  150 - 400 K/uL   BASIC METABOLIC PANEL Status: Abnormal    Collection Time    02/23/14 5:57 PM   Result  Value  Ref Range    Sodium  138  137 - 147 mEq/L    Potassium  3.2 (*)  3.7 - 5.3 mEq/L    Chloride  100  96 - 112 mEq/L    CO2  22  19 - 32 mEq/L    Glucose, Bld  153 (*)  70 - 99 mg/dL    BUN  43 (*)  6 - 23 mg/dL    Creatinine, Ser  1.42 (*)  0.50 - 1.10 mg/dL    Calcium  8.4  8.4 - 10.5 mg/dL    GFR calc non Af Amer  31 (*)  >90 mL/min    GFR calc Af Amer  36 (*)  >90 mL/min    Anion gap  16 (*)  5 - 15   PRO B NATRIURETIC PEPTIDE  Status: None    Collection Time    02/23/14 5:57 PM   Result  Value  Ref Range    Pro B Natriuretic peptide (BNP)  154.1  0 - 450 pg/mL   I-STAT TROPOININ, ED Status: None    Collection Time    02/23/14 6:06 PM   Result  Value  Ref Range    Troponin i, poc  0.02  0.00 - 0.08 ng/mL    Comment 3      Radiology/Studies: Dg Chest Port 1 View  02/23/2014 CLINICAL DATA: Dizziness, tachycardia, history of hypertension and breast malignancy EXAM: PORTABLE CHEST - 1 VIEW COMPARISON: Portable chest x-ray of January 28, 2011 FINDINGS: The lungs are well-expanded and clear. The heart is normal in size. The pulmonary vascularity is not engorged. There is no pleural  effusion. External pacing-defibrillation pads are present. The bony thorax is unremarkable. There is degenerative change of the glenohumeral joint on the right. IMPRESSION: There is no evidence of acute cardiopulmonary abnormality. Electronically Signed By: David Martinique On: 02/23/2014 18:34   EKG:NSR   ASSESSMENT AND PLAN:  Principal Problem:  PSVT  Active Problems:  Dyspnea  HYPERTENSION  History of GI bleed- Aug 2014 (NSAIDs)  Acute renal insufficiency  GERD  Breast cancer- s/p mastectomy  DJD (degenerative joint disease)   1. SVT:  She had SVT in the setting of volume depletion.  She was on 2 different diuretics - she is better after rehydration. Her medical doctor had intended on stopping the lasix and starting chlorthalidone 25 mg instead. It appears that she never DC'd the lasix and the combination of these 2 diuretics caused her volume depletion.   I would send her out on chlorthalidone 25 in addition to her hydralazine, lisinopril - assuming that her renal function corrects  2. HTN:  Better.  Will sign off.  Call for questions.  Thayer Headings, Brooke Bonito., MD, [facility] 02/24/2014, 10:10 AM [address],  [place] Pager [phone]

## 2014-02-24 NOTE — ED Provider Notes (Signed)
I saw and evaluated the patient, reviewed the resident's note and I agree with the findings and plan.   EKG Interpretation   Date/Time:  Wednesday February 23 2014 18:05:47 EDT Ventricular Rate:  128 PR Interval:  57 QRS Duration: 92 QT Interval:  335 QTC Calculation: 489 R Axis:   -74 Text Interpretation:  Ectopic atrial tachycardia, unifocal Inferior  infarct, old Anterolateral infarct, age indeterminate Different from  previous earlier this visit Confirmed by [facility]  MD, [place] [phone]) on  02/23/2014 6:52:28 PM      Angiocath insertion Performed by: [name]  Consent: Verbal consent obtained. Risks and benefits: risks, benefits and alternatives were discussed Time out: Immediately prior to procedure a "time out" was called to verify the correct patient, procedure, equipment, support staff and site/side marked as required.  Preparation: Patient was prepped and draped in the usual sterile fashion.  Vein Location: R AC  Yes Ultrasound Guided  Gauge: 20  Normal blood return and flush without difficulty Patient tolerance: Patient tolerated the procedure well with no immediate complications.   Patient here with SOB, SVT. Patient in and out of SVT with vagal maneuvers. Had some hypotension, but resolved after fluids (and repositioning of BP cuff). Patient mentating always, always had radial pulse, doubt her low BPs as indicated by the automated cuff. Patient will spontaneously convert to sinus, then drop back into SVT. 6 mg and 12 mg of adenosine given by EMS with minimal response. Patient was persistently converting in and out here, no adenosine or cardioversion performed.  Cards consulted, recommended metoprolol IV once and esmolol drip. Patient admitted to stepdown by Dr. Ardeth Perfect.  CRITICAL CARE Performed by: [name]   Total critical care time: 30 minutes  Critical care time was exclusive of separately billable procedures and treating other  patients.  Critical care was necessary to treat or prevent imminent or life-threatening deterioration.  Critical care was time spent personally by me on the following activities: development of treatment plan with patient and/or surrogate as well as nursing, discussions with consultants, evaluation of patient's response to treatment, examination of patient, obtaining history from patient or surrogate, ordering and performing treatments and interventions, ordering and review of laboratory studies, ordering and review of radiographic studies, pulse oximetry and re-evaluation of patient's condition.   Evelina Bucy, MD 02/24/14 [phone]

## 2014-02-24 NOTE — Discharge Instructions (Signed)
[facility] Stay Proper nutrition can help your body recover from illness and injury.   Foods and beverages high in protein, vitamins, and minerals help rebuild muscle loss, promote healing, & reduce fall risk.   In addition to eating healthy foods, a nutrition shake is an easy, delicious way to get the nutrition you need during and after your hospital stay  It is recommended that you continue to drink 2 bottles per day of:      Resource Breeze for at least 1 month (30 days) after your hospital stay   Tips for adding a nutrition shake into your routine: As allowed, drink one with vitamins or medications instead of water or juice Enjoy one as a tasty mid-morning or afternoon snack Drink cold or make a milkshake out of it Drink one instead of milk with cereal or snacks Use as a coffee creamer   Available at the following grocery stores and pharmacies:           * [facility] [phone]            For COUPONS visit: www.ensure.com/join or http://dawson-may.com/   Suggested Substitutions Ensure Plus = Boost Plus = Carnation Breakfast Essentials = Boost Compact Ensure Active Clear = Boost Breeze Glucerna Shake = Boost Glucose Control = Carnation Breakfast Essentials SUGAR FREE

## 2014-02-24 NOTE — Progress Notes (Signed)
Pt arrived from unit 2H via wheelchair.

## 2014-02-24 NOTE — Care Management Note (Addendum)
    Page 1 of 2   02/25/2014     4:46:13 PM CARE MANAGEMENT NOTE 02/25/2014  Patient:  [name], [name]   Account Number:  [id number]  Date Initiated:  02/24/2014  Documentation initiated by:  Elissa Hefty  Subjective/Objective Assessment:   adm w svt     Action/Plan:   lives alone, pcp dr [name]   Anticipated DC Date:  02/25/2014   Anticipated DC Plan:  [place]  CM consult      [facility] Choice  HOME HEALTH   Choice offered to / List presented to:  C-2 HC POA / Guardian   DME arranged  [facility]      DME agency  [place] arranged  HH-1 RN  [place].   Status of service:  Completed, signed off Medicare Important Message given?  NA - LOS <3 / Initial given by admissions (If response is "NO", the following Medicare IM given date fields will be blank) Date Medicare IM given:   Medicare IM given by:   Date Additional Medicare IM given:   Additional Medicare IM given by:    Discharge Disposition:  [place]  Per UR Regulation:  Reviewed for med. necessity/level of care/duration of stay  If discussed at [place] of Stay Meetings, dates discussed:    Comments:  02/25/14 Ellan Lambert, RN, BSN [phone] Received call from [facility] that they will not be able to staff pt's home health case.  Called pt's g-dtr, [name], she states she would like to use [facility] for [facility] needs. Referral to [facility], per family choice.  02/25/14 Ellan Lambert, RN, BSN [phone] pt for dc home today; lives alone but has assist from family members.  Pt will need HH follow up; granddaughter at bedside [name] [phone]); she wishes to use [facility] for [facility] needs.  Faxed referral to Kim at [facility] at [phone].  Pt requests 4 pronged cane for home use.  Referral to [facility] for DME needs.  Granddaughter requesting someone to clean pt's house and cook;  explained this out of pocket and not covered by insurance.  Offered private duty/sitter list, but she declines.

## 2014-02-24 NOTE — Progress Notes (Signed)
Chart reviewed.   [facility] PROGRESS NOTE  [name] PNT:[id number] DOB: 11-Feb-1922 DOA: 02/23/2014 PCP: Scarlette Calico, MD  Assessment/Plan:  Principal Problem:   PSVT:   None further. Potassium better. Cardiology signed off. Will give low dose metoprolol Active Problems:   HYPERTENSION: lisinopril, hydralazine held.   GERD on PPI   Breast cancer- s/p mastectomy   Acute renal insufficiency secondary to diuretics: continue IVF  deconditioning: lives alone. Will get PT eval and likely needs home health RN, PT.  Transfer to tele  Code Status:  full Family Communication:  Multiple at bedisde Disposition Plan:  Home 1-2 days if continues to improve.  Consultants:  [facility]  Procedures:     Antibiotics:    HPI/Subjective: Feels better. No dyspnea or CP. Has not been oob yet.  Objective: Filed Vitals:   02/24/14 1100  BP:   Pulse:   Temp:   Resp: 16    Intake/Output Summary (Last 24 hours) at 02/24/14 1132 Last data filed at 02/24/14 0800  Gross per 24 hour  Intake    900 ml  Output   1100 ml  Net   -200 ml   Filed Weights   02/23/14 2320 02/24/14 0433  Weight: 73.8 kg (162 lb 11.2 oz) 73.8 kg (162 lb 11.2 oz)   Tele: NSR  Exam:   General:  Alert, oriented  Cardiovascular: RRR without MGR  Respiratory: CTA without WRR  Abdomen: S, nt, nd  Ext: no CCE  Basic Metabolic Panel:  Recent Labs Lab 02/23/14 1757 02/23/14 1904 02/24/14 0407  NA 138  --  142  K 3.2*  --  4.2  CL 100  --  105  CO2 22  --  24  GLUCOSE 153*  --  85  BUN 43*  --  42*  CREATININE 1.42*  --  1.31*  CALCIUM 8.4  --  8.5  MG  --  2.2  --    Liver Function Tests: No results found for this basename: AST, ALT, ALKPHOS, BILITOT, PROT, ALBUMIN,  in the last 168 hours No results found for this basename: LIPASE, AMYLASE,  in the last 168 hours No results found for this basename: AMMONIA,  in the last 168 hours CBC:  Recent Labs Lab 02/23/14 1757  02/24/14 0407  WBC 9.1 8.0  HGB 12.4 12.2  HCT 37.2 36.7  MCV 87.3 85.9  PLT 330 346   Cardiac Enzymes: No results found for this basename: CKTOTAL, CKMB, CKMBINDEX, TROPONINI,  in the last 168 hours BNP (last 3 results)  Recent Labs  02/23/14 1757  PROBNP 154.1   CBG: No results found for this basename: GLUCAP,  in the last 168 hours  No results found for this or any previous visit (from the past 240 hour(s)).   Studies: Dg Chest Port 1 View  02/23/2014   CLINICAL DATA:  Dizziness, tachycardia, history of hypertension and breast malignancy  EXAM: PORTABLE CHEST - 1 VIEW  COMPARISON:  Portable chest x-ray of January 28, 2011  FINDINGS: The lungs are well-expanded and clear. The heart is normal in size. The pulmonary vascularity is not engorged. There is no pleural effusion. External pacing-defibrillation pads are present. The bony thorax is unremarkable. There is degenerative change of the glenohumeral joint on the right.  IMPRESSION: There is no evidence of acute cardiopulmonary abnormality.   Electronically Signed   By: David  Martinique   On: 02/23/2014 18:34    Scheduled Meds: . aspirin EC  81 mg  Oral Daily  . enoxaparin (LOVENOX) injection  40 mg Subcutaneous QHS  . feeding supplement (RESOURCE BREEZE)  1 Container Oral TID BM  . pantoprazole  40 mg Oral Daily  . sodium chloride  3 mL Intravenous Q12H  . sodium chloride  3 mL Intravenous Q12H  . Travoprost (BAK Free)  1 drop Both Eyes QHS   Continuous Infusions: . sodium chloride 75 mL/hr at 02/24/14 0012  . esmolol Stopped (02/23/14 2105)   Time spent: 35 minutes  [facility] Pager [phone]. If 7PM-7AM, please contact night-coverage at [email or web address], password [facility] 02/24/2014, 11:32 AM  LOS: 1 day

## 2014-02-24 NOTE — Progress Notes (Addendum)
INITIAL NUTRITION ASSESSMENT  DOCUMENTATION CODES Per approved criteria  -Severe malnutrition in the context of acute illness or injury   Pt meets criteria for severe MALNUTRITION in the context of acute illness as evidenced by moderate fat and muscle depletion  INTERVENTION: Resource Breeze po TID, each supplement provides 250 kcal and 9 grams of protein  NUTRITION DIAGNOSIS: Inadequate oral intake related to decreased appetite as evidenced by moderate fat and muscle depletion, diet hx.   Goal: Pt will meet >90% of estimated nutritional needs  Monitor:  Diet advancement, PO intake, labs, weight changes, I/O's  Reason for Assessment: MST=2  80 y.o. female  Admitting Dx: PSVT (paroxysmal supraventricular tachycardia)  Pt w/ PMH of HTN presents after 2 episodes today resulted in presyncope, palpitations, dizziness. In the ED found to be in SVT. Adenosine failed to break in the EMS xport but metop in the ED broke the rhythm. She will be placed on esmolol ggt for rate control, and admitted to SDU.   ASSESSMENT: Pt admitted for PSVT. Hx obtained by pt and multiple family members at bedside. Both confirm weight loss and poor appetite PTA. Pt granddaughter reports weight loss has been gradual, however, is unable to quantify time frame or amount of weight loss. Pt confirms UBW of 170#. She reports decreased appetite over the past 2-3 weeks.  Typical intake is 2 meals per day. Pt lives alone and prepares meals herself. She has good family support.  Diet recall- Breakfast (0900): grits, sausage, egg, cheese, toast, coffee; Lunch: skip ("I'm not hungry for lunch"); Dinner: fried chicken OR steak OR pork chop, rice OR mashed potatoes, broccoli. Beverages consist mainly of hot tea and [facility].  Noted that pt has missing teeth; she denies issues chewing or swallowing. She has dentures at home, but does not use them.  She is excited that her diet has been upgraded. She reports she ate  applesauce and a fruit cup today with no issues.  Educated pt and family about importance of good PO intake to promote healing. Discussed supplement options with pt and family. She declines offer of milk-type supplements (Ensure or Boost) as she is afraid they will be too hard on her stomach. She is agreeable to [employer].  Labs reviewed. BUN/Creat: 42/1.31.  Nutrition Focused Physical Exam:  Subcutaneous Fat:  Orbital Region: moderate depletion Upper Arm Region: mild depletion Thoracic and Lumbar Region: WDL  Muscle:  Temple Region: moderate depletion Clavicle Bone Region: WDL Clavicle and Acromion Bone Region: WDL Scapular Bone Region: mild depletion Dorsal Hand: mild depletion Patellar Region: WDL Anterior Thigh Region: WDL Posterior Calf Region: WDL  Edema: none present  Height: Ht Readings from Last 1 Encounters:  02/23/14 5\' 4"  (1.626 m)    Weight: Wt Readings from Last 1 Encounters:  02/24/14 162 lb 11.2 oz (73.8 kg)    Ideal Body Weight: 120#  % Ideal Body Weight: 135%  Wt Readings from Last 10 Encounters:  02/24/14 162 lb 11.2 oz (73.8 kg)  02/09/14 164 lb (74.39 kg)  08/11/13 167 lb 6.4 oz (75.932 kg)  04/06/13 168 lb 12.8 oz (76.567 kg)  03/11/13 169 lb 11.2 oz (76.975 kg)  02/04/13 168 lb (76.204 kg)  01/24/13 169 lb 15.6 oz (77.1 kg)  01/24/13 169 lb 15.6 oz (77.1 kg)  10/02/12 170 lb (77.111 kg)  06/25/12 173 lb 1.3 oz (78.509 kg)    Usual Body Weight: 170#  % Usual Body Weight: 95%  BMI:  Body mass index is 27.[place]  kg/(m^2). Overweight  Estimated Nutritional Needs: Kcal: 1600-1800 Protein: 88-98 grams Fluid: 1.6-1.8 L  Skin: PTA  Diet Order: NPO  EDUCATION NEEDS: -Education needs addressed   Intake/Output Summary (Last 24 hours) at 02/24/14 0857 Last data filed at 02/24/14 0800  Gross per 24 hour  Intake    900 ml  Output   1100 ml  Net   -200 ml    Last BM: 02/23/14  Labs:   Recent Labs Lab 02/23/14 1757  02/23/14 1904 02/24/14 0407  NA 138  --  142  K 3.2*  --  4.2  CL 100  --  105  CO2 22  --  24  BUN 43*  --  42*  CREATININE 1.42*  --  1.31*  CALCIUM 8.4  --  8.5  MG  --  2.2  --   GLUCOSE 153*  --  85    CBG (last 3)  No results found for this basename: GLUCAP,  in the last 72 hours  Scheduled Meds: . aspirin EC  81 mg Oral Daily  . enoxaparin (LOVENOX) injection  40 mg Subcutaneous QHS  . pantoprazole  40 mg Oral Daily  . sodium chloride  3 mL Intravenous Q12H  . sodium chloride  3 mL Intravenous Q12H  . Travoprost (BAK Free)  1 drop Both Eyes QHS    Continuous Infusions: . sodium chloride 75 mL/hr at 02/24/14 0012  . esmolol Stopped (02/23/14 2105)    Past Medical History  Diagnosis Date  . Hypertension   . Cancer   . Glaucoma   . Allergy   . Arthritis   . Ulcer   . Complication of anesthesia     shortness of breath after procedure    Past Surgical History  Procedure Laterality Date  . Cholecystectomy    . Breast surgery  2011  . Breast surgery  2012    lt breast masty  . Abdominal hysterectomy    . Esophagogastroduodenoscopy N/A 01/25/2013    Procedure: ESOPHAGOGASTRODUODENOSCOPY (EGD);  Surgeon: Jerene Bears, MD;  Location: [place];  Service: Gastroenterology;  Laterality: N/A;    Quetzaly Ebner A. Jimmye Norman, RD, LDN Pager: [phone] After hours Pager: [phone]

## 2014-02-25 DIAGNOSIS — I498 Other specified cardiac arrhythmias: Secondary | ICD-10-CM

## 2014-02-25 LAB — BASIC METABOLIC PANEL
Anion gap: 13 (ref 5–15)
BUN: 24 mg/dL — ABNORMAL HIGH (ref 6–23)
CALCIUM: 8.6 mg/dL (ref 8.4–10.5)
CO2: 24 mEq/L (ref 19–32)
Chloride: 110 mEq/L (ref 96–112)
Creatinine, Ser: 0.99 mg/dL (ref 0.50–1.10)
GFR calc Af Amer: 56 mL/min — ABNORMAL LOW (ref >90)
GFR calc non Af Amer: 49 mL/min — ABNORMAL LOW (ref >90)
GLUCOSE: 81 mg/dL (ref 70–99)
POTASSIUM: 3.5 meq/L — AB (ref 3.7–5.3)
Sodium: 147 mEq/L (ref 137–147)

## 2014-02-25 MED ORDER — POTASSIUM CHLORIDE CRYS ER 20 MEQ PO TBCR: 40.0000 meq | EXTENDED_RELEASE_TABLET | Freq: Once | ORAL | Status: DC

## 2014-02-25 MED ORDER — ASPIRIN 81 MG PO TBEC: 81.0000 mg | DELAYED_RELEASE_TABLET | Freq: Every day | ORAL | Status: DC

## 2014-02-25 MED ORDER — METOPROLOL TARTRATE 25 MG PO TABS: 25.0000 mg | ORAL_TABLET | Freq: Two times a day (BID) | ORAL | Status: DC

## 2014-02-25 NOTE — Evaluation (Signed)
Physical Therapy Evaluation [name] MRN: [id number] DOB: [birth year] Today's Date: 02/25/2014   History of Present Illness    80 y/o AA female, widowed, lives alone, no prior cardiac issues except for HTN- admitted through the ER with fatigue and SOB. Her PO appetite has been poor for the past several weeks.  In reviewing her chart, it became evident that she was on 2 diuretics . She has been on Lasix for a long time and then had chlorthalidone added. The Lasix was supposed to be stopped but she kept taking it. After that, she had poor PO intake, was admitted with SVT, volume depletion.  Her BUN / creatinine ratio suggest dehydration.      Clinical Impression  Pt admitted with above. Pt currently with functional limitations due to the deficits listed below (see PT Problem List).  Should do well going home with cane use at all times.  Pt aware.  HHPT safety eval recommended.  Pt will benefit from skilled PT to increase their independence and safety with mobility to allow discharge to the venue listed below.     Follow Up Recommendations Home health PT;Supervision/Assistance - 24 hour    Equipment Recommendations  None recommended by PT    Recommendations for Other Services       Precautions / Restrictions Precautions Precautions: Fall Restrictions Weight Bearing Restrictions: No      Mobility  Bed Mobility Overal bed mobility: Independent                Transfers Overall transfer level: Needs assistance Equipment used: Straight cane Transfers: Sit to/from Stand Sit to Stand: Supervision         General transfer comment: Pt needed two attempts to stand from recliner.  Pt states she getts stiff after sitting awhile.  Once pt got her feet planted like she wanted did not need asssit to rise from chair and was steady once up.    Ambulation/Gait Ambulation/Gait assistance: Modified independent (Device/Increase time) Ambulation Distance  (Feet): 150 Feet Assistive device: Straight cane Gait Pattern/deviations: Step-through pattern;Decreased stride length;Antalgic;Wide base of support   Gait velocity interpretation: Below normal speed for age/gender General Gait Details: Pt able to ambulate with cane with good safety awareness.  No LOB but no real challenges given to balance this am.  Pt aware she needs to continue to use cane.  Pt appears close to baseline and she states she is ambulating as she did at home.    Stairs            Wheelchair Mobility    Modified Rankin (Stroke Patients Only)       Balance Overall balance assessment: Needs assistance;History of Falls         Standing balance support: Single extremity supported;During functional activity Standing balance-Leahy Scale: Fair Standing balance comment: can stand with cane without physical assist.  cannot withstand significant challenges to balance.                   Standardized Balance Assessment Standardized Balance Assessment : Dynamic Gait Index   Dynamic Gait Index Level Surface: Mild Impairment Change in Gait Speed: Mild Impairment Gait with Horizontal Head Turns: Mild Impairment Gait with Vertical Head Turns: Mild Impairment Gait and Pivot Turn: Moderate Impairment Step Over Obstacle: Mild Impairment Step Around Obstacles: Moderate Impairment Steps: Moderate Impairment Total Score: 13       Pertinent Vitals/Pain Pain Assessment: No/denies pain VSS    Home Living Family/patient expects  to be discharged to:: Private residence Living Arrangements: Alone Available Help at Discharge: Family;Available PRN/intermittently Type of Home: House Home Access: Ramped entrance     Home Layout: One level Home Equipment: Cane - single point      Prior Function Level of Independence: Independent with assistive device(s)         Comments: sometimes used cane     Hand Dominance   Dominant Hand: Right    Extremity/Trunk  Assessment   Upper Extremity Assessment: Defer to OT evaluation           Lower Extremity Assessment: Generalized weakness      Cervical / Trunk Assessment: Kyphotic  Communication   Communication: No difficulties  Cognition Arousal/Alertness: Awake/alert Behavior During Therapy: WFL for tasks assessed/performed Overall Cognitive Status: Within Functional Limits for tasks assessed                      General Comments General comments (skin integrity, edema, etc.): Pt scored 13/24 on DGI suggesting pt at risk of falls without device.  Pt demonstrates safe ambulation with cane in controlled environment.      Exercises        Assessment/Plan    PT Assessment Patient needs continued PT services  PT Diagnosis Generalized weakness   PT Problem List Decreased activity tolerance;Decreased balance;Decreased mobility;Decreased knowledge of use of DME;Decreased safety awareness;Decreased knowledge of precautions  PT Treatment Interventions DME instruction;Gait training;Functional mobility training;Therapeutic activities;Therapeutic exercise;Balance training;Patient/family education   PT Goals (Current goals can be found in the Care Plan section) Acute Rehab PT Goals Patient Stated Goal: to go home PT Goal Formulation: With patient Time For Goal Achievement: 03/04/14 Potential to Achieve Goals: Good    Frequency Min 3X/week   Barriers to discharge Decreased caregiver support      Co-evaluation               End of Session Equipment Utilized During Treatment: Gait belt Activity Tolerance: Patient tolerated treatment well Patient left: with call bell/phone within reach;in bed;with bed alarm set Nurse Communication: Mobility status         Time: 6761-9509 PT Time Calculation (min): 21 min   Charges:   PT Evaluation $Initial PT Evaluation Tier I: 1 Procedure PT Treatments $Gait Training: 8-22 mins   PT G Codes:          INGOLD,Kaulin Chaves 03/17/2014,  9:59 AM [facility] [phone] (pager)

## 2014-02-25 NOTE — Discharge Summary (Signed)
Physician Discharge Summary  QUINTASHA GREN ZOX:[id number] DOB: [birth year] DOA: 02/23/2014  PCP: Scarlette Calico, MD  Admit date: 02/23/2014 Discharge date: 02/25/2014  Time spent: greater than 30 minutes  Recommendations for Outpatient Follow-up:  1. Home RN, PT, OT, aide arranged 2. Monitor blood pressure 3. Monitor BMET 4. Reassess need to restart diuretics  Discharge Diagnoses:  Principal Problem:   PSVT  Active Problems:   HYPERTENSION   GERD   Breast cancer- s/p mastectomy   DJD (degenerative joint disease)   Dyspnea   History of GI bleed- Aug 2014 (NSAIDs)   Acute renal insufficiency   SVT (supraventricular tachycardia)   Protein-calorie malnutrition, severe deconditioning  Discharge Condition: stable  Filed Weights   02/23/14 2320 02/24/14 0433 02/25/14 0605  Weight: 73.8 kg (162 lb 11.2 oz) 73.8 kg (162 lb 11.2 oz) 74.2 kg (163 lb 9.3 oz)    History of present illness:  Pt w/ PMH of HTN presents after 2 episodes today resulted in presyncope, palpitations, dizziness. In the ED found to be in SVT. Adenosine failed to break in the EMS xport but metop in the ED broke the rhythm. She will be placed on esmolol ggt for rate control, and admitted to SDU. Cardiology will be the primary care team for the SVT. However, given her mild renal impairment and hypokalemia , [facility] was consulted to admit . We will care for these conditions   Hospital Course:  Admitted to stepdown. Cardiology consulted. Hypokalemia corrected.  Diuretics held and given IVF. Started on beta blocker. Renal failure resolved and no further SVT. Diuretics held at discharge. Will need to reassess need to resume. Hydralazine stopped due to borderline blood pressures and institution metoprolol.  Needs blood pressure check as outpatient  Lives alone and worked with PT. Will arrange home PT/OT/aide/RN. Family will be able to provide 24 hour supervision  Procedures:  none  Consultations:  cardiology  Discharge  Exam: Filed Vitals:   02/25/14 0605  BP: 143/62  Pulse: 55  Temp: 98.4 F (36.9 C)  Resp: 18    General: comfortable Cardiovascular: RRR Respiratory: CTA  Abd: s, nt, nd Ext no CCE  Current Discharge Medication List    START taking these medications   Details  aspirin EC 81 MG EC tablet Take 1 tablet (81 mg total) by mouth daily.    metoprolol tartrate (LOPRESSOR) 25 MG tablet Take 1 tablet (25 mg total) by mouth 2 (two) times daily. Qty: 60 tablet, Refills: 0      CONTINUE these medications which have NOT CHANGED   Details  acetaminophen (TYLENOL) 325 MG tablet Take 325 mg by mouth every 6 (six) hours as needed for pain.    lisinopril (PRINIVIL,ZESTRIL) 20 MG tablet Take 1 tablet (20 mg total) by mouth daily. Qty: 30 tablet, Refills: 11   Associated Diagnoses: Unspecified essential hypertension    pantoprazole (PROTONIX) 40 MG tablet Take 1 tablet (40 mg total) by mouth daily. Qty: 30 tablet, Refills: 11   Associated Diagnoses: Gastroesophageal reflux disease with esophagitis    travoprost, benzalkonium, (TRAVATAN) 0.004 % ophthalmic solution Place 1 drop into both eyes at bedtime.    trolamine salicylate (ASPERCREME) 10 % cream Apply 1 application topically as needed for muscle pain (knees, back).      STOP taking these medications     chlorthalidone (HYGROTON) 25 MG tablet      furosemide (LASIX) 40 MG tablet      hydrALAZINE (APRESOLINE) 50 MG tablet  Allergies  Allergen Reactions  . Codeine     REACTION: causes nausea and vomiting   Follow-up Information   Follow up with Scarlette Calico, MD In 1 week.   Specialty:  Internal Medicine   Contact information:   [address] [address] [phone]        The results of significant diagnostics from this hospitalization (including imaging, microbiology, ancillary and laboratory) are listed below for reference.    Significant Diagnostic Studies: Dg Chest Port 1  View  02/23/2014   CLINICAL DATA:  Dizziness, tachycardia, history of hypertension and breast malignancy  EXAM: PORTABLE CHEST - 1 VIEW  COMPARISON:  Portable chest x-ray of January 28, 2011  FINDINGS: The lungs are well-expanded and clear. The heart is normal in size. The pulmonary vascularity is not engorged. There is no pleural effusion. External pacing-defibrillation pads are present. The bony thorax is unremarkable. There is degenerative change of the glenohumeral joint on the right.  IMPRESSION: There is no evidence of acute cardiopulmonary abnormality.   Electronically Signed   By: David  Martinique   On: 02/23/2014 18:34   EKG Sinus rhythm with 1st degree A-V block Left anterior fascicular block Abnormal ECG  Microbiology: Recent Results (from the past 240 hour(s))  MRSA PCR SCREENING     Status: None   Collection Time    02/24/14  9:37 AM      Result Value Ref Range Status   MRSA by PCR NEGATIVE  NEGATIVE Final   Comment:            The GeneXpert MRSA Assay (FDA     approved for NASAL specimens     only), is one component of a     comprehensive MRSA colonization     surveillance program. It is not     intended to diagnose MRSA     infection nor to guide or     monitor treatment for     MRSA infections.     Labs: Basic Metabolic Panel:  Recent Labs Lab 02/23/14 1757 02/23/14 1904 02/24/14 0407 02/25/14 0625  NA 138  --  142 147  K 3.2*  --  4.2 3.5*  CL 100  --  105 110  CO2 22  --  24 24  GLUCOSE 153*  --  85 81  BUN 43*  --  42* 24*  CREATININE 1.42*  --  1.31* 0.99  CALCIUM 8.4  --  8.5 8.6  MG  --  2.2  --   --    Liver Function Tests: No results found for this basename: AST, ALT, ALKPHOS, BILITOT, PROT, ALBUMIN,  in the last 168 hours No results found for this basename: LIPASE, AMYLASE,  in the last 168 hours No results found for this basename: AMMONIA,  in the last 168 hours CBC:  Recent Labs Lab 02/23/14 1757 02/24/14 0407  WBC 9.1 8.0  HGB 12.4 12.2   HCT 37.2 36.7  MCV 87.3 85.9  PLT 330 346   Cardiac Enzymes: No results found for this basename: CKTOTAL, CKMB, CKMBINDEX, TROPONINI,  in the last 168 hours BNP: BNP (last 3 results)  Recent Labs  02/23/14 1757  PROBNP 154.1   CBG: No results found for this basename: GLUCAP,  in the last 168 hours     Signed:  Maryagnes Carrasco L  [facility] 02/25/2014, 12:19 PM

## 2014-02-25 NOTE — Progress Notes (Signed)
Discharge instruction reviewed with patient. No questions or concerns voice at this time. Awaiting on transportation.  Ave Filter, RN

## 2014-02-26 DIAGNOSIS — Z853 Personal history of malignant neoplasm of breast: Secondary | ICD-10-CM | POA: Diagnosis not present

## 2014-02-26 DIAGNOSIS — I10 Essential (primary) hypertension: Secondary | ICD-10-CM | POA: Diagnosis not present

## 2014-02-26 DIAGNOSIS — M1990 Unspecified osteoarthritis, unspecified site: Secondary | ICD-10-CM | POA: Diagnosis not present

## 2014-02-26 DIAGNOSIS — I471 Supraventricular tachycardia: Secondary | ICD-10-CM | POA: Diagnosis not present

## 2014-03-01 DIAGNOSIS — Z853 Personal history of malignant neoplasm of breast: Secondary | ICD-10-CM | POA: Diagnosis not present

## 2014-03-01 DIAGNOSIS — M1990 Unspecified osteoarthritis, unspecified site: Secondary | ICD-10-CM | POA: Diagnosis not present

## 2014-03-01 DIAGNOSIS — I10 Essential (primary) hypertension: Secondary | ICD-10-CM | POA: Diagnosis not present

## 2014-03-01 DIAGNOSIS — I471 Supraventricular tachycardia: Secondary | ICD-10-CM | POA: Diagnosis not present

## 2014-03-02 DIAGNOSIS — M1990 Unspecified osteoarthritis, unspecified site: Secondary | ICD-10-CM | POA: Diagnosis not present

## 2014-03-02 DIAGNOSIS — Z853 Personal history of malignant neoplasm of breast: Secondary | ICD-10-CM | POA: Diagnosis not present

## 2014-03-02 DIAGNOSIS — I471 Supraventricular tachycardia: Secondary | ICD-10-CM | POA: Diagnosis not present

## 2014-03-02 DIAGNOSIS — I10 Essential (primary) hypertension: Secondary | ICD-10-CM | POA: Diagnosis not present

## 2014-03-03 DIAGNOSIS — Z853 Personal history of malignant neoplasm of breast: Secondary | ICD-10-CM | POA: Diagnosis not present

## 2014-03-03 DIAGNOSIS — M1990 Unspecified osteoarthritis, unspecified site: Secondary | ICD-10-CM | POA: Diagnosis not present

## 2014-03-03 DIAGNOSIS — I10 Essential (primary) hypertension: Secondary | ICD-10-CM | POA: Diagnosis not present

## 2014-03-03 DIAGNOSIS — I471 Supraventricular tachycardia: Secondary | ICD-10-CM | POA: Diagnosis not present

## 2014-03-05 DIAGNOSIS — Z853 Personal history of malignant neoplasm of breast: Secondary | ICD-10-CM | POA: Diagnosis not present

## 2014-03-05 DIAGNOSIS — M1990 Unspecified osteoarthritis, unspecified site: Secondary | ICD-10-CM | POA: Diagnosis not present

## 2014-03-05 DIAGNOSIS — I10 Essential (primary) hypertension: Secondary | ICD-10-CM | POA: Diagnosis not present

## 2014-03-05 DIAGNOSIS — I471 Supraventricular tachycardia: Secondary | ICD-10-CM | POA: Diagnosis not present

## 2014-03-07 DIAGNOSIS — I471 Supraventricular tachycardia: Secondary | ICD-10-CM | POA: Diagnosis not present

## 2014-03-07 DIAGNOSIS — Z853 Personal history of malignant neoplasm of breast: Secondary | ICD-10-CM | POA: Diagnosis not present

## 2014-03-07 DIAGNOSIS — I10 Essential (primary) hypertension: Secondary | ICD-10-CM | POA: Diagnosis not present

## 2014-03-07 DIAGNOSIS — M1990 Unspecified osteoarthritis, unspecified site: Secondary | ICD-10-CM | POA: Diagnosis not present

## 2014-03-08 DIAGNOSIS — M1990 Unspecified osteoarthritis, unspecified site: Secondary | ICD-10-CM | POA: Diagnosis not present

## 2014-03-08 DIAGNOSIS — Z853 Personal history of malignant neoplasm of breast: Secondary | ICD-10-CM | POA: Diagnosis not present

## 2014-03-08 DIAGNOSIS — I10 Essential (primary) hypertension: Secondary | ICD-10-CM | POA: Diagnosis not present

## 2014-03-08 DIAGNOSIS — I471 Supraventricular tachycardia: Secondary | ICD-10-CM | POA: Diagnosis not present

## 2014-03-09 DIAGNOSIS — Z853 Personal history of malignant neoplasm of breast: Secondary | ICD-10-CM | POA: Diagnosis not present

## 2014-03-09 DIAGNOSIS — I10 Essential (primary) hypertension: Secondary | ICD-10-CM | POA: Diagnosis not present

## 2014-03-09 DIAGNOSIS — I471 Supraventricular tachycardia: Secondary | ICD-10-CM | POA: Diagnosis not present

## 2014-03-09 DIAGNOSIS — M1990 Unspecified osteoarthritis, unspecified site: Secondary | ICD-10-CM | POA: Diagnosis not present

## 2014-03-10 ENCOUNTER — Ambulatory Visit (INDEPENDENT_AMBULATORY_CARE_PROVIDER_SITE_OTHER): Payer: Medicare Other | Admitting: Internal Medicine

## 2014-03-10 ENCOUNTER — Encounter: Payer: Self-pay | Admitting: Internal Medicine

## 2014-03-10 VITALS — BP 150/72 | HR 67 | Temp 98.2°F | Resp 14 | Wt 168.0 lb

## 2014-03-10 DIAGNOSIS — E43 Unspecified severe protein-calorie malnutrition: Secondary | ICD-10-CM | POA: Diagnosis not present

## 2014-03-10 DIAGNOSIS — I471 Supraventricular tachycardia: Secondary | ICD-10-CM | POA: Diagnosis not present

## 2014-03-10 DIAGNOSIS — I10 Essential (primary) hypertension: Secondary | ICD-10-CM | POA: Diagnosis not present

## 2014-03-10 MED ORDER — BOOST / RESOURCE BREEZE PO LIQD: 1.0000 | Freq: Three times a day (TID) | ORAL | Status: DC

## 2014-03-10 NOTE — Progress Notes (Signed)
Pre visit review using our clinic review tool, if applicable. No additional management support is needed unless otherwise documented below in the visit note. 

## 2014-03-10 NOTE — Patient Instructions (Signed)
We have sent in the breeze drinks to your pharmacy and I'm not sure if your insurance will cover it.   We are not changing your medicines today and want to see you back in about 3 months to check in on you.  Keep working with therapy to make you strong. If you have any questions or problems please call us back.

## 2014-03-11 DIAGNOSIS — M1990 Unspecified osteoarthritis, unspecified site: Secondary | ICD-10-CM | POA: Diagnosis not present

## 2014-03-11 DIAGNOSIS — I10 Essential (primary) hypertension: Secondary | ICD-10-CM | POA: Diagnosis not present

## 2014-03-11 DIAGNOSIS — I471 Supraventricular tachycardia: Secondary | ICD-10-CM | POA: Diagnosis not present

## 2014-03-11 DIAGNOSIS — Z853 Personal history of malignant neoplasm of breast: Secondary | ICD-10-CM | POA: Diagnosis not present

## 2014-03-11 NOTE — Progress Notes (Signed)
   Subjective:    Patient ID: [name], female    DOB: 1921/06/18, 80 y.o.   MRN: [id number]  HPI The patient is a 80 YO woman coming in today for a hospital follow up. She was in the hospital due to medication misrelay and she ended up dehydrated. She is doing well now and has not fallen at home since leaving the hospital. She is working with PT at home. She denies nausea, vomiting. She is eating well. She would like to see if insurance would pay for resource breeze that she had in the hospital.   Review of Systems  Constitutional: Positive for activity change. Negative for fever, chills, appetite change and fatigue.  HENT: Negative.   Eyes: Negative.   Respiratory: Negative for cough, chest tightness, shortness of breath and wheezing.   Cardiovascular: Negative for chest pain, palpitations and leg swelling.  Gastrointestinal: Negative for abdominal pain, diarrhea, constipation and abdominal distention.  Genitourinary: Negative for dysuria.  Musculoskeletal: Positive for gait problem.  Neurological: Negative for dizziness, weakness and light-headedness.      Objective:   Physical Exam  Constitutional: She is oriented to person, place, and time. She appears well-developed and well-nourished.  Skinny  HENT:  Head: Normocephalic and atraumatic.  Eyes: EOM are normal.  Neck: Normal range of motion.  Cardiovascular: Normal rate and regular rhythm.   Pulmonary/Chest: Effort normal and breath sounds normal. No respiratory distress. She has no wheezes. She has no rales.  Abdominal: Soft. Bowel sounds are normal.  Neurological: She is alert and oriented to person, place, and time. Coordination abnormal.  Walks well with walker.   Skin: Skin is warm and dry.   Filed Vitals:   03/10/14 1534  BP: 150/72  Pulse: 67  Temp: 98.2 F (36.8 C)  TempSrc: Oral  Resp: 14  Weight: 168 lb (76.204 kg)  SpO2: 97%      Assessment & Plan:

## 2014-03-11 NOTE — Assessment & Plan Note (Signed)
BP is controlled on lisinopril and metoprolol. Continue.

## 2014-03-11 NOTE — Assessment & Plan Note (Signed)
Rx for resource breeze sent in to the pharmacy.

## 2014-03-11 NOTE — Assessment & Plan Note (Signed)
In the setting of dehydration. On beta blocker and will monitor for recurrence.

## 2014-03-14 DIAGNOSIS — Z853 Personal history of malignant neoplasm of breast: Secondary | ICD-10-CM | POA: Diagnosis not present

## 2014-03-14 DIAGNOSIS — M1990 Unspecified osteoarthritis, unspecified site: Secondary | ICD-10-CM | POA: Diagnosis not present

## 2014-03-14 DIAGNOSIS — I471 Supraventricular tachycardia: Secondary | ICD-10-CM | POA: Diagnosis not present

## 2014-03-14 DIAGNOSIS — I10 Essential (primary) hypertension: Secondary | ICD-10-CM | POA: Diagnosis not present

## 2014-03-15 ENCOUNTER — Ambulatory Visit: Payer: Medicare Other | Admitting: Oncology

## 2014-03-17 DIAGNOSIS — I471 Supraventricular tachycardia: Secondary | ICD-10-CM | POA: Diagnosis not present

## 2014-03-17 DIAGNOSIS — I10 Essential (primary) hypertension: Secondary | ICD-10-CM | POA: Diagnosis not present

## 2014-03-17 DIAGNOSIS — Z853 Personal history of malignant neoplasm of breast: Secondary | ICD-10-CM | POA: Diagnosis not present

## 2014-03-17 DIAGNOSIS — M1990 Unspecified osteoarthritis, unspecified site: Secondary | ICD-10-CM | POA: Diagnosis not present

## 2014-03-18 DIAGNOSIS — Z853 Personal history of malignant neoplasm of breast: Secondary | ICD-10-CM | POA: Diagnosis not present

## 2014-03-18 DIAGNOSIS — M1990 Unspecified osteoarthritis, unspecified site: Secondary | ICD-10-CM | POA: Diagnosis not present

## 2014-03-18 DIAGNOSIS — I471 Supraventricular tachycardia: Secondary | ICD-10-CM | POA: Diagnosis not present

## 2014-03-18 DIAGNOSIS — I10 Essential (primary) hypertension: Secondary | ICD-10-CM | POA: Diagnosis not present

## 2014-03-21 ENCOUNTER — Telehealth: Payer: Self-pay | Admitting: Internal Medicine

## 2014-03-21 DIAGNOSIS — Z853 Personal history of malignant neoplasm of breast: Secondary | ICD-10-CM | POA: Diagnosis not present

## 2014-03-21 DIAGNOSIS — M1990 Unspecified osteoarthritis, unspecified site: Secondary | ICD-10-CM | POA: Diagnosis not present

## 2014-03-21 DIAGNOSIS — I471 Supraventricular tachycardia: Secondary | ICD-10-CM | POA: Diagnosis not present

## 2014-03-21 DIAGNOSIS — I10 Essential (primary) hypertension: Secondary | ICD-10-CM | POA: Diagnosis not present

## 2014-03-21 NOTE — Telephone Encounter (Signed)
[name] from [facility] wanted to let Dr Doug Sou know last several visits pt's BP has been elevated. Today's reading was 182/92. Please advise.

## 2014-03-21 NOTE — Telephone Encounter (Signed)
Called [place] gave her md response. Pt stated they been checking it at rest. She not complaining of any sxs. She does get very anxious about her BP & she is taking both meds as rx everyday...Johny Chess

## 2014-03-21 NOTE — Telephone Encounter (Signed)
We wish her to continue with lisinopril and metoprolol. Are they checking after PT or at rest? BP was well controlled at last visit.

## 2014-03-25 DIAGNOSIS — M1990 Unspecified osteoarthritis, unspecified site: Secondary | ICD-10-CM | POA: Diagnosis not present

## 2014-03-25 DIAGNOSIS — I471 Supraventricular tachycardia: Secondary | ICD-10-CM | POA: Diagnosis not present

## 2014-03-25 DIAGNOSIS — I10 Essential (primary) hypertension: Secondary | ICD-10-CM | POA: Diagnosis not present

## 2014-03-25 DIAGNOSIS — Z853 Personal history of malignant neoplasm of breast: Secondary | ICD-10-CM | POA: Diagnosis not present

## 2014-03-28 DIAGNOSIS — I471 Supraventricular tachycardia: Secondary | ICD-10-CM | POA: Diagnosis not present

## 2014-03-28 DIAGNOSIS — Z853 Personal history of malignant neoplasm of breast: Secondary | ICD-10-CM | POA: Diagnosis not present

## 2014-03-28 DIAGNOSIS — I10 Essential (primary) hypertension: Secondary | ICD-10-CM | POA: Diagnosis not present

## 2014-03-28 DIAGNOSIS — M1990 Unspecified osteoarthritis, unspecified site: Secondary | ICD-10-CM | POA: Diagnosis not present

## 2014-03-31 ENCOUNTER — Encounter: Payer: Self-pay | Admitting: Internal Medicine

## 2014-03-31 ENCOUNTER — Ambulatory Visit (INDEPENDENT_AMBULATORY_CARE_PROVIDER_SITE_OTHER): Payer: Medicare Other | Admitting: Internal Medicine

## 2014-03-31 VITALS — BP 150/80 | HR 76 | Temp 98.1°F | Resp 12 | Wt 162.2 lb

## 2014-03-31 DIAGNOSIS — I10 Essential (primary) hypertension: Secondary | ICD-10-CM | POA: Diagnosis not present

## 2014-03-31 DIAGNOSIS — E43 Unspecified severe protein-calorie malnutrition: Secondary | ICD-10-CM | POA: Diagnosis not present

## 2014-03-31 DIAGNOSIS — K2100 Gastro-esophageal reflux disease with esophagitis, without bleeding: Secondary | ICD-10-CM

## 2014-03-31 MED ORDER — LISINOPRIL 40 MG PO TABS: 40.0000 mg | ORAL_TABLET | Freq: Every day | ORAL | Status: DC

## 2014-03-31 MED ORDER — BOOST / RESOURCE BREEZE PO LIQD: 1.0000 | Freq: Three times a day (TID) | ORAL | Status: DC

## 2014-03-31 MED ORDER — PANTOPRAZOLE SODIUM 40 MG PO TBEC: 40.0000 mg | DELAYED_RELEASE_TABLET | Freq: Every day | ORAL | Status: DC

## 2014-03-31 MED ORDER — METOPROLOL TARTRATE 25 MG PO TABS: 25.0000 mg | ORAL_TABLET | Freq: Two times a day (BID) | ORAL | Status: DC

## 2014-03-31 NOTE — Patient Instructions (Signed)
Please be sure not to run out of your medicines and call the pharmacy when you are getting low instead of running out.   We have sent in the metoprolol. Continue taking this one 1 pill twice a day.  The other medicine is lisinopril and we have sent in a new strength of this medicine. When you get it filled ask for the 40 mg pill (you have a 20 mg pill now). You can pick up the new prescription when you get the other medicines.   We will see you back in about 2 months to check on your blood pressure and blood work.

## 2014-03-31 NOTE — Progress Notes (Signed)
Pre visit review using our clinic review tool, if applicable. No additional management support is needed unless otherwise documented below in the visit note. 

## 2014-03-31 NOTE — Assessment & Plan Note (Signed)
Given the patient has had multiple high blood pressure readings on her medications at home can increase lisinopril to 40 mg daily metoprolol 25 mg twice a day. Provided refills and informed her and reinforced the need to continue taking her blood pressure medication and not to run out. She will return in 2 months at which time we will do BMP.

## 2014-03-31 NOTE — Progress Notes (Signed)
   Subjective:    Patient ID: [name], female    DOB: 03/04/1922, 80 y.o.   MRN: [id number]  HPI The patient is a 80 YO woman coming in today for a blood pressure follow up.  She is not always faithful with her blood pressure medications and is currently out of her metoprolol. She has been noticing some high blood pressures at home with home health readings. They're concerned about her blood pressure. Her granddaughter is with her today and helps to provide history. She denies falls, dizziness, chest pain, shortness of breath, abdominal pain.  Review of Systems  Constitutional: Positive for activity change. Negative for fever, chills, appetite change and fatigue.  HENT: Negative.   Eyes: Negative.   Respiratory: Negative for cough, chest tightness, shortness of breath and wheezing.   Cardiovascular: Negative for chest pain, palpitations and leg swelling.  Gastrointestinal: Negative for abdominal pain, diarrhea, constipation and abdominal distention.  Genitourinary: Negative for dysuria.  Musculoskeletal: Positive for gait problem.  Neurological: Negative for dizziness, weakness and light-headedness.      Objective:   Physical Exam  Constitutional: She is oriented to person, place, and time. She appears well-developed and well-nourished.  Skinny  HENT:  Head: Normocephalic and atraumatic.  Eyes: EOM are normal.  Neck: Normal range of motion.  Cardiovascular: Normal rate and regular rhythm.   Pulmonary/Chest: Effort normal and breath sounds normal. No respiratory distress. She has no wheezes. She has no rales.  Abdominal: Soft. Bowel sounds are normal.  Neurological: She is alert and oriented to person, place, and time. Coordination abnormal.  Walks well with walker.   Skin: Skin is warm and dry.   Filed Vitals:   03/31/14 0842 03/31/14 0909  BP: 190/82 150/80  Pulse: 76   Temp: 98.1 F (36.7 C)   TempSrc: Oral   Resp: 12   Weight: 162 lb 3.2 oz (73.573 kg)   SpO2:  98%       Assessment & Plan:

## 2014-03-31 NOTE — Assessment & Plan Note (Signed)
Her granddaughter wishes me to resend the resource previous prescription to the pharmacy and will do.

## 2014-04-04 DIAGNOSIS — I10 Essential (primary) hypertension: Secondary | ICD-10-CM | POA: Diagnosis not present

## 2014-04-04 DIAGNOSIS — Z853 Personal history of malignant neoplasm of breast: Secondary | ICD-10-CM | POA: Diagnosis not present

## 2014-04-04 DIAGNOSIS — M1990 Unspecified osteoarthritis, unspecified site: Secondary | ICD-10-CM | POA: Diagnosis not present

## 2014-04-04 DIAGNOSIS — I471 Supraventricular tachycardia: Secondary | ICD-10-CM | POA: Diagnosis not present

## 2014-04-09 DIAGNOSIS — Z853 Personal history of malignant neoplasm of breast: Secondary | ICD-10-CM | POA: Diagnosis not present

## 2014-04-09 DIAGNOSIS — M1990 Unspecified osteoarthritis, unspecified site: Secondary | ICD-10-CM | POA: Diagnosis not present

## 2014-04-09 DIAGNOSIS — I471 Supraventricular tachycardia: Secondary | ICD-10-CM | POA: Diagnosis not present

## 2014-04-09 DIAGNOSIS — I10 Essential (primary) hypertension: Secondary | ICD-10-CM | POA: Diagnosis not present

## 2014-04-11 DIAGNOSIS — I10 Essential (primary) hypertension: Secondary | ICD-10-CM | POA: Diagnosis not present

## 2014-04-11 DIAGNOSIS — Z853 Personal history of malignant neoplasm of breast: Secondary | ICD-10-CM | POA: Diagnosis not present

## 2014-04-11 DIAGNOSIS — I471 Supraventricular tachycardia: Secondary | ICD-10-CM | POA: Diagnosis not present

## 2014-04-11 DIAGNOSIS — M1990 Unspecified osteoarthritis, unspecified site: Secondary | ICD-10-CM | POA: Diagnosis not present

## 2014-04-18 DIAGNOSIS — I471 Supraventricular tachycardia: Secondary | ICD-10-CM | POA: Diagnosis not present

## 2014-04-18 DIAGNOSIS — M1990 Unspecified osteoarthritis, unspecified site: Secondary | ICD-10-CM | POA: Diagnosis not present

## 2014-04-18 DIAGNOSIS — I10 Essential (primary) hypertension: Secondary | ICD-10-CM | POA: Diagnosis not present

## 2014-04-18 DIAGNOSIS — Z853 Personal history of malignant neoplasm of breast: Secondary | ICD-10-CM | POA: Diagnosis not present

## 2014-04-23 ENCOUNTER — Emergency Department (HOSPITAL_COMMUNITY)
Admission: EM | Admit: 2014-04-23 | Discharge: 2014-04-23 | Disposition: A | Discharge status: Home / Self Care | Payer: Medicare Other | Attending: Emergency Medicine | Admitting: Emergency Medicine

## 2014-04-23 ENCOUNTER — Encounter (HOSPITAL_COMMUNITY): Payer: Self-pay | Admitting: Emergency Medicine

## 2014-04-23 ENCOUNTER — Emergency Department (HOSPITAL_COMMUNITY): Payer: Medicare Other

## 2014-04-23 DIAGNOSIS — Z872 Personal history of diseases of the skin and subcutaneous tissue: Secondary | ICD-10-CM | POA: Diagnosis not present

## 2014-04-23 DIAGNOSIS — I10 Essential (primary) hypertension: Secondary | ICD-10-CM | POA: Diagnosis not present

## 2014-04-23 DIAGNOSIS — R531 Weakness: Secondary | ICD-10-CM

## 2014-04-23 DIAGNOSIS — M6281 Muscle weakness (generalized): Secondary | ICD-10-CM | POA: Diagnosis not present

## 2014-04-23 DIAGNOSIS — Z7982 Long term (current) use of aspirin: Secondary | ICD-10-CM | POA: Insufficient documentation

## 2014-04-23 DIAGNOSIS — M1990 Unspecified osteoarthritis, unspecified site: Secondary | ICD-10-CM | POA: Diagnosis not present

## 2014-04-23 DIAGNOSIS — R42 Dizziness and giddiness: Secondary | ICD-10-CM | POA: Diagnosis present

## 2014-04-23 DIAGNOSIS — H409 Unspecified glaucoma: Secondary | ICD-10-CM | POA: Insufficient documentation

## 2014-04-23 DIAGNOSIS — Z859 Personal history of malignant neoplasm, unspecified: Secondary | ICD-10-CM | POA: Insufficient documentation

## 2014-04-23 DIAGNOSIS — Z79899 Other long term (current) drug therapy: Secondary | ICD-10-CM | POA: Diagnosis not present

## 2014-04-23 LAB — CBC WITH DIFFERENTIAL/PLATELET
Basophils Absolute: 0 10*3/uL (ref 0.0–0.1)
Basophils Relative: 0 % (ref 0–1)
EOS PCT: 2 % (ref 0–5)
Eosinophils Absolute: 0.1 10*3/uL (ref 0.0–0.7)
HCT: 39.1 % (ref 36.0–46.0)
HEMOGLOBIN: 12.8 g/dL (ref 12.0–15.0)
Lymphocytes Relative: 26 % (ref 12–46)
Lymphs Abs: 1.4 10*3/uL (ref 0.7–4.0)
MCH: 28.1 pg (ref 26.0–34.0)
MCHC: 32.7 g/dL (ref 30.0–36.0)
MCV: 85.7 fL (ref 78.0–100.0)
MONOS PCT: 8 % (ref 3–12)
Monocytes Absolute: 0.4 10*3/uL (ref 0.1–1.0)
NEUTROS ABS: 3.4 10*3/uL (ref 1.7–7.7)
Neutrophils Relative %: 64 % (ref 43–77)
Platelets: 349 10*3/uL (ref 150–400)
RBC: 4.56 MIL/uL (ref 3.87–5.11)
RDW: 14.2 % (ref 11.5–15.5)
WBC: 5.3 10*3/uL (ref 4.0–10.5)

## 2014-04-23 LAB — BASIC METABOLIC PANEL
Anion gap: 12 (ref 5–15)
BUN: 15 mg/dL (ref 6–23)
CHLORIDE: 104 meq/L (ref 96–112)
CO2: 26 mEq/L (ref 19–32)
Calcium: 9.6 mg/dL (ref 8.4–10.5)
Creatinine, Ser: 0.92 mg/dL (ref 0.50–1.10)
GFR calc Af Amer: 62 mL/min — ABNORMAL LOW (ref >90)
GFR calc non Af Amer: 53 mL/min — ABNORMAL LOW (ref >90)
GLUCOSE: 96 mg/dL (ref 70–99)
Potassium: 5.1 mEq/L (ref 3.7–5.3)
Sodium: 142 mEq/L (ref 137–147)

## 2014-04-23 LAB — URINALYSIS, ROUTINE W REFLEX MICROSCOPIC
BILIRUBIN URINE: NEGATIVE
GLUCOSE, UA: NEGATIVE mg/dL
Ketones, ur: NEGATIVE mg/dL
NITRITE: NEGATIVE
PH: 6.5 (ref 5.0–8.0)
Protein, ur: NEGATIVE mg/dL
SPECIFIC GRAVITY, URINE: 1.004 — AB (ref 1.005–1.030)
Urobilinogen, UA: 1 mg/dL (ref 0.0–1.0)

## 2014-04-23 LAB — URINE MICROSCOPIC-ADD ON

## 2014-04-23 NOTE — ED Notes (Signed)
Pt arrived with granddaughter. Pt stated that she has been having some dizziness and feeling like she is going to fall down. Stated that she had some dizziness yesterday morning but she waited a while and then it went away. Pt was discharged from the hospital about a month ago when she was admitted in the hospital for dizziness because of dehydration from taking too much diuretic medication. Currently not dizzy at this time.

## 2014-04-23 NOTE — Discharge Instructions (Signed)
Hypertension °Hypertension, commonly called high blood pressure, is when the force of blood pumping through your arteries is too strong. Your arteries are the blood vessels that carry blood from your heart throughout your body. A blood pressure reading consists of a higher number over a lower number, such as 110/72. The higher number (systolic) is the pressure inside your arteries when your heart pumps. The lower number (diastolic) is the pressure inside your arteries when your heart relaxes. Ideally you want your blood pressure below 120/80. °Hypertension forces your heart to work harder to pump blood. Your arteries may become narrow or stiff. Having hypertension puts you at risk for heart disease, stroke, and other problems.  °RISK FACTORS °Some risk factors for high blood pressure are controllable. Others are not.  °Risk factors you cannot control include:  °· Race. You may be at higher risk if you are African American. °· Age. Risk increases with age. °· Gender. Men are at higher risk than women before age 45 years. After age 65, women are at higher risk than men. °Risk factors you can control include: °· Not getting enough exercise or physical activity. °· Being overweight. °· Getting too much fat, sugar, calories, or salt in your diet. °· Drinking too much alcohol. °SIGNS AND SYMPTOMS °Hypertension does not usually cause signs or symptoms. Extremely high blood pressure (hypertensive crisis) may cause headache, anxiety, shortness of breath, and nosebleed. °DIAGNOSIS  °To check if you have hypertension, your health care provider will measure your blood pressure while you are seated, with your arm held at the level of your heart. It should be measured at least twice using the same arm. Certain conditions can cause a difference in blood pressure between your right and left arms. A blood pressure reading that is higher than normal on one occasion does not mean that you need treatment. If one blood pressure reading  is high, ask your health care provider about having it checked again. °TREATMENT  °Treating high blood pressure includes making lifestyle changes and possibly taking medicine. Living a healthy lifestyle can help lower high blood pressure. You may need to change some of your habits. °Lifestyle changes may include: °· Following the DASH diet. This diet is high in fruits, vegetables, and whole grains. It is low in salt, red meat, and added sugars. °· Getting at least 2½ hours of brisk physical activity every week. °· Losing weight if necessary. °· Not smoking. °· Limiting alcoholic beverages. °· Learning ways to reduce stress. ° If lifestyle changes are not enough to get your blood pressure under control, your health care provider may prescribe medicine. You may need to take more than one. Work closely with your health care provider to understand the risks and benefits. °HOME CARE INSTRUCTIONS °· Have your blood pressure rechecked as directed by your health care provider.   °· Take medicines only as directed by your health care provider. Follow the directions carefully. Blood pressure medicines must be taken as prescribed. The medicine does not work as well when you skip doses. Skipping doses also puts you at risk for problems.   °· Do not smoke.   °· Monitor your blood pressure at home as directed by your health care provider.  °SEEK MEDICAL CARE IF:  °· You think you are having a reaction to medicines taken. °· You have recurrent headaches or feel dizzy. °· You have swelling in your ankles. °· You have trouble with your vision. °SEEK IMMEDIATE MEDICAL CARE IF: °· You develop a severe headache or confusion. °·   You have unusual weakness, numbness, or feel faint. °· You have severe chest or abdominal pain. °· You vomit repeatedly. °· You have trouble breathing. °MAKE SURE YOU:  °· Understand these instructions. °· Will watch your condition. °· Will get help right away if you are not doing well or get worse. °Document  Released: 05/20/2005 Document Revised: 10/04/2013 Document Reviewed: 03/12/2013 °Exit[employer]® Patient Information ©2015 [employer]. This information is not intended to replace advice given to you by your health care provider. Make sure you discuss any questions you have with your health care provider. °Weakness °Weakness is a lack of strength. It may be felt all over the body (generalized) or in one specific part of the body (focal). Some causes of weakness can be serious. You may need further medical evaluation, especially if you are elderly or you have a history of immunosuppression (such as chemotherapy or HIV), kidney disease, heart disease, or diabetes. °CAUSES  °Weakness can be caused by many different things, including: °· Infection. °· Physical exhaustion. °· Internal bleeding or other blood loss that results in a lack of red blood cells (anemia). °· Dehydration. This cause is more common in elderly people. °· Side effects or electrolyte abnormalities from medicines, such as pain medicines or sedatives. °· Emotional distress, anxiety, or depression. °· Circulation problems, especially severe peripheral arterial disease. °· Heart disease, such as rapid atrial fibrillation, bradycardia, or heart failure. °· Nervous system disorders, such as Guillain-Barré syndrome, multiple sclerosis, or stroke. °DIAGNOSIS  °To find the cause of your weakness, your caregiver will take your history and perform a physical exam. Lab tests or X-rays may also be ordered, if needed. °TREATMENT  °Treatment of weakness depends on the cause of your symptoms and can vary greatly. °HOME CARE INSTRUCTIONS  °· Rest as needed. °· Eat a well-balanced diet. °· Try to get some exercise every day. °· Only take over-the-counter or prescription medicines as directed by your caregiver. °SEEK MEDICAL CARE IF:  °· Your weakness seems to be getting worse or spreads to other parts of your body. °· You develop new aches or pains. °SEEK IMMEDIATE  MEDICAL CARE IF:  °· You cannot perform your normal daily activities, such as getting dressed and feeding yourself. °· You cannot walk up and down stairs, or you feel exhausted when you do so. °· You have shortness of breath or chest pain. °· You have difficulty moving parts of your body. °· You have weakness in only one area of the body or on only one side of the body. °· You have a fever. °· You have trouble speaking or swallowing. °· You cannot control your bladder or bowel movements. °· You have black or bloody vomit or stools. °MAKE SURE YOU: °· Understand these instructions. °· Will watch your condition. °· Will get help right away if you are not doing well or get worse. °Document Released: 05/20/2005 Document Revised: 11/19/2011 Document Reviewed: 07/19/2011 °ExitCare® Patient Information ©2015 [employer]. This information is not intended to replace advice given to you by your health care provider. Make sure you discuss any questions you have with your health care provider. ° °

## 2014-04-23 NOTE — ED Provider Notes (Signed)
CSN: [id number]     Arrival date & time 04/23/14  1019 History   First MD Initiated Contact with Patient 04/23/14 1040     Chief Complaint  Patient presents with  . Dizziness     (Consider location/radiation/quality/duration/timing/severity/associated sxs/prior Treatment) Patient is a 80 y.o. female presenting with dizziness.  Dizziness Associated symptoms: no chest pain, no diarrhea, no headaches, no nausea, no shortness of breath and no vomiting    patient states that she has had episodes of dizziness in the morning when she gets up for the last few days. No cough. She does not feel her heart racing. No nausea or vomiting. No fevers. No chest pain. No abdominal pain. States she felt somewhat fatigued. She has had decreased appetite. She's had a recent admission was found to be in SVT. She has had some adjustment of her blood pressure medicine since then. No headache. No confusion. She states she feels decent other times a day.  Past Medical History  Diagnosis Date  . Hypertension   . Cancer   . Glaucoma   . Allergy   . Arthritis   . Ulcer   . Complication of anesthesia     shortness of breath after procedure   Past Surgical History  Procedure Laterality Date  . Cholecystectomy    . Breast surgery  2011  . Breast surgery  2012    lt breast masty  . Abdominal hysterectomy    . Esophagogastroduodenoscopy N/A 01/25/2013    Procedure: ESOPHAGOGASTRODUODENOSCOPY (EGD);  Surgeon: Jerene Bears, MD;  Location: [place];  Service: Gastroenterology;  Laterality: N/A;   Family History  Problem Relation Age of Onset  . Coronary artery disease Mother 46    MI   History  Substance Use Topics  . Smoking status: Never Smoker   . Smokeless tobacco: Never Used  . Alcohol Use: No   OB History    No data available     Review of Systems  Constitutional: Positive for appetite change. Negative for activity change.  Eyes: Negative for pain.  Respiratory: Negative for chest  tightness and shortness of breath.   Cardiovascular: Negative for chest pain and leg swelling.  Gastrointestinal: Negative for nausea, vomiting, abdominal pain and diarrhea.  Genitourinary: Negative for flank pain.  Musculoskeletal: Negative for back pain and neck stiffness.  Skin: Negative for rash.  Neurological: Positive for dizziness and light-headedness. Negative for weakness, numbness and headaches.  Psychiatric/Behavioral: Negative for behavioral problems.      Allergies  Codeine  Home Medications   Prior to Admission medications   Medication Sig Start Date End Date Taking? Authorizing Provider  acetaminophen (TYLENOL) 325 MG tablet Take 325 mg by mouth every 6 (six) hours as needed for pain.   Yes Historical Provider, MD  aspirin EC 81 MG EC tablet Take 1 tablet (81 mg total) by mouth daily. 02/25/14  Yes Delfina Redwood, MD  feeding supplement, RESOURCE BREEZE, (RESOURCE BREEZE) LIQD Take 1 Container by mouth 3 (three) times daily between meals. 03/31/14  Yes Olga Millers, MD  lisinopril (PRINIVIL,ZESTRIL) 40 MG tablet Take 1 tablet (40 mg total) by mouth daily. 03/31/14  Yes Olga Millers, MD  metoprolol tartrate (LOPRESSOR) 25 MG tablet Take 1 tablet (25 mg total) by mouth 2 (two) times daily. 03/31/14  Yes Olga Millers, MD  pantoprazole (PROTONIX) 40 MG tablet Take 1 tablet (40 mg total) by mouth daily. 03/31/14  Yes Olga Millers, MD  travoprost, benzalkonium, (TRAVATAN) 0.004 %  ophthalmic solution Place 1 drop into both eyes at bedtime.   Yes Historical Provider, MD  trolamine salicylate (ASPERCREME) 10 % cream Apply 1 application topically as needed for muscle pain (knees, back).   Yes Historical Provider, MD   BP 178/83 mmHg  Pulse 63  Temp(Src) 98.4 F (36.9 C)  Resp 18  SpO2 97% Physical Exam  Constitutional: She is oriented to person, place, and time. She appears well-developed and well-nourished.  HENT:  Head: Normocephalic and  atraumatic.  Neck: Normal range of motion. Neck supple.  Cardiovascular: Normal rate, regular rhythm and normal heart sounds.   No murmur heard. Pulmonary/Chest: Effort normal and breath sounds normal. No respiratory distress. She has no wheezes. She has no rales.  Abdominal: Soft. Bowel sounds are normal. She exhibits no distension. There is no tenderness.  Musculoskeletal: Normal range of motion.  Neurological: She is alert and oriented to person, place, and time. No cranial nerve deficit.  Skin: Skin is warm and dry.  Psychiatric: She has a normal mood and affect. Her speech is normal.  Nursing note and vitals reviewed.   ED Course  Procedures (including critical care time) Labs Review Labs Reviewed  BASIC METABOLIC PANEL - Abnormal; Notable for the following:    GFR calc non Af Amer 53 (*)    GFR calc Af Amer 62 (*)    All other components within normal limits  URINALYSIS, ROUTINE W REFLEX MICROSCOPIC - Abnormal; Notable for the following:    Specific Gravity, Urine 1.004 (*)    Hgb urine dipstick TRACE (*)    Leukocytes, UA TRACE (*)    All other components within normal limits  URINE MICROSCOPIC-ADD ON - Abnormal; Notable for the following:    Squamous Epithelial / LPF FEW (*)    All other components within normal limits  CBC WITH DIFFERENTIAL    Imaging Review Dg Chest 2 View  04/23/2014   CLINICAL DATA:  Weakness for the last month. History of hypertension.  EXAM: CHEST  2 VIEW  COMPARISON:  02/23/2014  FINDINGS: Heart is borderline in size. No confluent airspace opacities. Linear densities in the left upper lobe most likely scarring and possible bronchiectasis. No effusions. Tortuosity of the thoracic aorta. No acute bony abnormality.  IMPRESSION: No active cardiopulmonary disease.   Electronically Signed   By: Rolm Baptise M.D.   On: 04/23/2014 12:41     EKG Interpretation   Date/Time:  Saturday April 23 2014 10:29:09 EST Ventricular Rate:  65 PR Interval:   240 QRS Duration: 105 QT Interval:  424 QTC Calculation: 441 R Axis:   -61 Text Interpretation:  Sinus or ectopic atrial rhythm Prolonged PR interval  Incomplete RBBB and LAFB nsclt Confirmed by Alvino Chapel  MD, Ovid Curd [id number])  on 04/23/2014 2:34:19 PM      MDM   Final diagnoses:  Weakness  Essential hypertension    Patient with weakness in the morning. Lab work reassuring. Some hypertension here. Recently had blood pressure medicines increased by PCP. Has been taking her medicine at 10 in the morning and 6 at night. Will move the second dose a little later in the evening to see if that will help with morning hypertension dizziness. Will discharge home to give her PCPs office a call on Monday.    Jasper Riling. Alvino Chapel, MD 04/23/14 1435

## 2014-04-25 ENCOUNTER — Ambulatory Visit: Payer: Medicare Other | Admitting: Internal Medicine

## 2014-04-25 DIAGNOSIS — I471 Supraventricular tachycardia: Secondary | ICD-10-CM | POA: Diagnosis not present

## 2014-04-25 DIAGNOSIS — Z853 Personal history of malignant neoplasm of breast: Secondary | ICD-10-CM | POA: Diagnosis not present

## 2014-04-25 DIAGNOSIS — M1990 Unspecified osteoarthritis, unspecified site: Secondary | ICD-10-CM | POA: Diagnosis not present

## 2014-04-25 DIAGNOSIS — I10 Essential (primary) hypertension: Secondary | ICD-10-CM | POA: Diagnosis not present

## 2014-04-26 ENCOUNTER — Ambulatory Visit (INDEPENDENT_AMBULATORY_CARE_PROVIDER_SITE_OTHER): Payer: Medicare Other | Admitting: Internal Medicine

## 2014-04-26 ENCOUNTER — Encounter: Payer: Self-pay | Admitting: Internal Medicine

## 2014-04-26 VITALS — BP 148/92 | HR 68 | Temp 98.0°F | Resp 12 | Wt 160.2 lb

## 2014-04-26 DIAGNOSIS — I10 Essential (primary) hypertension: Secondary | ICD-10-CM | POA: Diagnosis not present

## 2014-04-26 NOTE — Progress Notes (Signed)
Pre visit review using our clinic review tool, if applicable. No additional management support is needed unless otherwise documented below in the visit note. 

## 2014-04-26 NOTE — Patient Instructions (Signed)
We will see back in about 3 months to check on your blood pressure. If you have any new problems or questions before our next visit please feel free to call our office.  Your blood pressure is doing much better today and we are very happy with it.

## 2014-04-26 NOTE — Assessment & Plan Note (Signed)
BP much better on increased dose of lisinopril. Her recent BMP at the emergency room was normal. We'll see her back in 3 months. No orthostatic symptoms today and no orthostasis at hospital visit for dizziness. Advised to eat or drink snack when she is feeling dizzy and see if this helps to alleviate it.

## 2014-04-26 NOTE — Progress Notes (Signed)
   Subjective:    Patient ID: [name], female    DOB: [birth year], 80 y.o.   MRN: [id number]  HPI The patient is a 80 year old female who comes in today for a follow-up of her blood pressure. We did increase her lisinopril at last visit. Since then she's been to the emergency room with an episode of dizziness. They did not note her blood pressure to be low or for her to have orthostatic hypotension at that time. She is not currently feeling dizzy now, she denies headaches, chest pain, nausea, confusion.  Review of Systems  Constitutional: Negative for fever, chills, appetite change and fatigue.  HENT: Negative.   Eyes: Negative.   Respiratory: Negative for cough, chest tightness, shortness of breath and wheezing.   Cardiovascular: Negative for chest pain, palpitations and leg swelling.  Gastrointestinal: Negative for abdominal pain, diarrhea, constipation and abdominal distention.  Genitourinary: Negative for dysuria.  Musculoskeletal: Positive for gait problem.  Neurological: Negative for dizziness, weakness and light-headedness.      Objective:   Physical Exam  Constitutional: She is oriented to person, place, and time. She appears well-developed and well-nourished.  Skinny  HENT:  Head: Normocephalic and atraumatic.  Eyes: EOM are normal.  Neck: Normal range of motion.  Cardiovascular: Normal rate and regular rhythm.   Pulmonary/Chest: Effort normal and breath sounds normal. No respiratory distress. She has no wheezes. She has no rales.  Abdominal: Soft. Bowel sounds are normal.  Neurological: She is alert and oriented to person, place, and time. Coordination abnormal.  Slow to stand  Skin: Skin is warm and dry.   Filed Vitals:   04/26/14 1123  BP: 148/92  Pulse: 68  Temp: 98 F (36.7 C)  TempSrc: Oral  Resp: 12  Weight: 160 lb 3.2 oz (72.666 kg)  SpO2: 99%      Assessment & Plan:

## 2014-05-09 ENCOUNTER — Telehealth: Payer: Self-pay | Admitting: Internal Medicine

## 2014-05-09 NOTE — Telephone Encounter (Signed)
Error

## 2014-05-23 DIAGNOSIS — H4011X1 Primary open-angle glaucoma, mild stage: Secondary | ICD-10-CM | POA: Diagnosis not present

## 2014-06-15 ENCOUNTER — Ambulatory Visit: Payer: Medicare Other | Admitting: Internal Medicine

## 2014-07-18 ENCOUNTER — Ambulatory Visit: Payer: Medicare Other | Admitting: Internal Medicine

## 2014-07-25 ENCOUNTER — Encounter: Payer: Self-pay | Admitting: Internal Medicine

## 2014-07-25 ENCOUNTER — Ambulatory Visit (INDEPENDENT_AMBULATORY_CARE_PROVIDER_SITE_OTHER): Payer: Medicare Other | Admitting: Internal Medicine

## 2014-07-25 VITALS — BP 180/80 | HR 64 | Temp 98.8°F | Resp 14 | Wt 151.1 lb

## 2014-07-25 DIAGNOSIS — I10 Essential (primary) hypertension: Secondary | ICD-10-CM | POA: Diagnosis not present

## 2014-07-25 DIAGNOSIS — M150 Primary generalized (osteo)arthritis: Secondary | ICD-10-CM

## 2014-07-25 DIAGNOSIS — M159 Polyosteoarthritis, unspecified: Secondary | ICD-10-CM

## 2014-07-25 MED ORDER — DICLOFENAC SODIUM 1 % TD GEL: 2.0000 g | Freq: Four times a day (QID) | TRANSDERMAL | Status: DC

## 2014-07-25 NOTE — Progress Notes (Signed)
   Subjective:    Patient ID: [name], female    DOB: 1922-04-01, 81 y.o.   MRN: [id number]  HPI The patient is a 81 YO female who is coming in with back and knee pain. She has had it for many years but it has been worse in the last 3-4 months. She is taking tylenol about 3 times per day and using aspercream on her knees. She rates it 4/10 at its worse. It does keep her from leaving the house at times. The tylenol helps for the most part. This is not a new problem.   Review of Systems  Constitutional: Negative for fever, chills, appetite change and fatigue.  HENT: Negative.   Eyes: Negative.   Respiratory: Negative for cough, chest tightness, shortness of breath and wheezing.   Cardiovascular: Negative for chest pain, palpitations and leg swelling.  Gastrointestinal: Negative for abdominal pain, diarrhea, constipation and abdominal distention.  Genitourinary: Negative for dysuria.  Musculoskeletal: Positive for back pain, arthralgias and gait problem.  Neurological: Negative for dizziness, weakness and light-headedness.      Objective:   Physical Exam  Constitutional: She is oriented to person, place, and time. She appears well-developed and well-nourished.  Skinny  HENT:  Head: Normocephalic and atraumatic.  Eyes: EOM are normal.  Neck: Normal range of motion.  Cardiovascular: Normal rate and regular rhythm.   Pulmonary/Chest: Effort normal and breath sounds normal. No respiratory distress. She has no wheezes. She has no rales.  Abdominal: Soft. Bowel sounds are normal.  Neurological: She is alert and oriented to person, place, and time. Coordination abnormal.  Slow to stand  Skin: Skin is warm and dry.    Filed Vitals:   07/25/14 1033  BP: 180/80  Pulse: 64  Temp: 98.8 F (37.1 C)  TempSrc: Oral  Resp: 14  Weight: 151 lb 1.9 oz (68.548 kg)  SpO2: 97%      Assessment & Plan:

## 2014-07-25 NOTE — Patient Instructions (Signed)
You are doing well today and we will not change your medicines today.  We do want you to start drinking a resource breeze or boost drink every day to help add some nutrition and vitamins to your diet.   For the constipation you can use the miralax at home. I would recommend using it maybe twice a week. If you are still having problems with constipation you can increase the number of times per week you take it. Call us if you have any questions.   Come back in about 6 months for a check up.

## 2014-07-25 NOTE — Progress Notes (Signed)
Pre visit review using our clinic review tool, if applicable. No additional management support is needed unless otherwise documented below in the visit note. 

## 2014-07-25 NOTE — Assessment & Plan Note (Signed)
Doing well with tylenol and will add voltaren gel instead of the aspercream. In some pain today as she did not take her tylenol before coming here. No indication for further imaging at this time.

## 2014-07-25 NOTE — Assessment & Plan Note (Signed)
BP up today with the acute pain as well as not taken her meds yet this morning. Last was well controlled and will not adjust therapy today. If still elevated next time will adjust. No symptoms.

## 2014-08-03 ENCOUNTER — Encounter: Payer: Self-pay | Admitting: Family

## 2014-08-03 ENCOUNTER — Emergency Department (HOSPITAL_COMMUNITY): Payer: Medicare Other

## 2014-08-03 ENCOUNTER — Encounter (HOSPITAL_COMMUNITY): Payer: Self-pay | Admitting: Emergency Medicine

## 2014-08-03 ENCOUNTER — Ambulatory Visit (INDEPENDENT_AMBULATORY_CARE_PROVIDER_SITE_OTHER): Payer: Medicare Other | Admitting: Family

## 2014-08-03 ENCOUNTER — Ambulatory Visit: Payer: Medicare Other | Admitting: Internal Medicine

## 2014-08-03 ENCOUNTER — Inpatient Hospital Stay (HOSPITAL_COMMUNITY)
Admission: EM | Admit: 2014-08-03 | Discharge: 2014-08-10 | DRG: 378 | Disposition: A | Discharge status: Rehabilitation Facility | Payer: Medicare Other | Attending: Internal Medicine | Admitting: Internal Medicine

## 2014-08-03 VITALS — BP 142/82 | HR 51 | Temp 98.2°F | Resp 12

## 2014-08-03 DIAGNOSIS — Z853 Personal history of malignant neoplasm of breast: Secondary | ICD-10-CM | POA: Diagnosis not present

## 2014-08-03 DIAGNOSIS — K219 Gastro-esophageal reflux disease without esophagitis: Secondary | ICD-10-CM | POA: Diagnosis present

## 2014-08-03 DIAGNOSIS — K922 Gastrointestinal hemorrhage, unspecified: Secondary | ICD-10-CM | POA: Diagnosis not present

## 2014-08-03 DIAGNOSIS — Z9010 Acquired absence of unspecified breast and nipple: Secondary | ICD-10-CM | POA: Diagnosis present

## 2014-08-03 DIAGNOSIS — K5900 Constipation, unspecified: Secondary | ICD-10-CM | POA: Diagnosis not present

## 2014-08-03 DIAGNOSIS — R410 Disorientation, unspecified: Secondary | ICD-10-CM | POA: Diagnosis not present

## 2014-08-03 DIAGNOSIS — K921 Melena: Secondary | ICD-10-CM

## 2014-08-03 DIAGNOSIS — Z7982 Long term (current) use of aspirin: Secondary | ICD-10-CM

## 2014-08-03 DIAGNOSIS — G8929 Other chronic pain: Secondary | ICD-10-CM | POA: Diagnosis present

## 2014-08-03 DIAGNOSIS — K2971 Gastritis, unspecified, with bleeding: Secondary | ICD-10-CM | POA: Diagnosis present

## 2014-08-03 DIAGNOSIS — E875 Hyperkalemia: Secondary | ICD-10-CM | POA: Diagnosis present

## 2014-08-03 DIAGNOSIS — I359 Nonrheumatic aortic valve disorder, unspecified: Secondary | ICD-10-CM | POA: Diagnosis not present

## 2014-08-03 DIAGNOSIS — R404 Transient alteration of awareness: Secondary | ICD-10-CM | POA: Diagnosis not present

## 2014-08-03 DIAGNOSIS — Z66 Do not resuscitate: Secondary | ICD-10-CM | POA: Diagnosis present

## 2014-08-03 DIAGNOSIS — M47899 Other spondylosis, site unspecified: Secondary | ICD-10-CM | POA: Diagnosis not present

## 2014-08-03 DIAGNOSIS — I10 Essential (primary) hypertension: Secondary | ICD-10-CM | POA: Diagnosis present

## 2014-08-03 DIAGNOSIS — R42 Dizziness and giddiness: Secondary | ICD-10-CM | POA: Diagnosis not present

## 2014-08-03 DIAGNOSIS — R5381 Other malaise: Secondary | ICD-10-CM | POA: Diagnosis not present

## 2014-08-03 DIAGNOSIS — K5791 Diverticulosis of intestine, part unspecified, without perforation or abscess with bleeding: Secondary | ICD-10-CM | POA: Diagnosis not present

## 2014-08-03 DIAGNOSIS — R5383 Other fatigue: Secondary | ICD-10-CM | POA: Diagnosis not present

## 2014-08-03 DIAGNOSIS — N189 Chronic kidney disease, unspecified: Secondary | ICD-10-CM | POA: Diagnosis present

## 2014-08-03 DIAGNOSIS — I471 Supraventricular tachycardia: Secondary | ICD-10-CM | POA: Diagnosis present

## 2014-08-03 DIAGNOSIS — M1990 Unspecified osteoarthritis, unspecified site: Secondary | ICD-10-CM | POA: Diagnosis present

## 2014-08-03 DIAGNOSIS — I728 Aneurysm of other specified arteries: Secondary | ICD-10-CM | POA: Diagnosis not present

## 2014-08-03 DIAGNOSIS — D509 Iron deficiency anemia, unspecified: Secondary | ICD-10-CM | POA: Diagnosis not present

## 2014-08-03 DIAGNOSIS — D1779 Benign lipomatous neoplasm of other sites: Secondary | ICD-10-CM | POA: Diagnosis not present

## 2014-08-03 DIAGNOSIS — E876 Hypokalemia: Secondary | ICD-10-CM | POA: Diagnosis not present

## 2014-08-03 DIAGNOSIS — M160 Bilateral primary osteoarthritis of hip: Secondary | ICD-10-CM | POA: Diagnosis not present

## 2014-08-03 DIAGNOSIS — D62 Acute posthemorrhagic anemia: Secondary | ICD-10-CM | POA: Diagnosis present

## 2014-08-03 DIAGNOSIS — I723 Aneurysm of iliac artery: Secondary | ICD-10-CM | POA: Diagnosis present

## 2014-08-03 DIAGNOSIS — I129 Hypertensive chronic kidney disease with stage 1 through stage 4 chronic kidney disease, or unspecified chronic kidney disease: Secondary | ICD-10-CM | POA: Diagnosis present

## 2014-08-03 DIAGNOSIS — H409 Unspecified glaucoma: Secondary | ICD-10-CM | POA: Diagnosis present

## 2014-08-03 DIAGNOSIS — K625 Hemorrhage of anus and rectum: Secondary | ICD-10-CM

## 2014-08-03 DIAGNOSIS — R000 Tachycardia, unspecified: Secondary | ICD-10-CM | POA: Diagnosis not present

## 2014-08-03 HISTORY — DX: Supraventricular tachycardia, unspecified: I47.10

## 2014-08-03 HISTORY — DX: Gastrointestinal hemorrhage, unspecified: K92.2

## 2014-08-03 HISTORY — DX: Unspecified severe protein-calorie malnutrition: E43

## 2014-08-03 HISTORY — DX: Supraventricular tachycardia: I47.1

## 2014-08-03 LAB — CBC WITH DIFFERENTIAL/PLATELET
BASOS PCT: 0 % (ref 0–1)
Basophils Absolute: 0 10*3/uL (ref 0.0–0.1)
Eosinophils Absolute: 0.1 10*3/uL (ref 0.0–0.7)
Eosinophils Relative: 1 % (ref 0–5)
HEMATOCRIT: 33.9 % — AB (ref 36.0–46.0)
HEMOGLOBIN: 11 g/dL — AB (ref 12.0–15.0)
Lymphocytes Relative: 18 % (ref 12–46)
Lymphs Abs: 1.5 10*3/uL (ref 0.7–4.0)
MCH: 27.4 pg (ref 26.0–34.0)
MCHC: 32.4 g/dL (ref 30.0–36.0)
MCV: 84.3 fL (ref 78.0–100.0)
MONO ABS: 0.4 10*3/uL (ref 0.1–1.0)
MONOS PCT: 5 % (ref 3–12)
NEUTROS ABS: 6.4 10*3/uL (ref 1.7–7.7)
Neutrophils Relative %: 76 % (ref 43–77)
Platelets: 340 10*3/uL (ref 150–400)
RBC: 4.02 MIL/uL (ref 3.87–5.11)
RDW: 14.8 % (ref 11.5–15.5)
WBC: 8.4 10*3/uL (ref 4.0–10.5)

## 2014-08-03 LAB — I-STAT CHEM 8, ED
BUN: 27 mg/dL — ABNORMAL HIGH (ref 6–23)
CHLORIDE: 106 mmol/L (ref 96–112)
Calcium, Ion: 1.07 mmol/L — ABNORMAL LOW (ref 1.13–1.30)
Creatinine, Ser: 0.9 mg/dL (ref 0.50–1.10)
GLUCOSE: 130 mg/dL — AB (ref 70–99)
HCT: 36 % (ref 36.0–46.0)
HEMOGLOBIN: 12.2 g/dL (ref 12.0–15.0)
POTASSIUM: 5.5 mmol/L — AB (ref 3.5–5.1)
Sodium: 140 mmol/L (ref 135–145)
TCO2: 23 mmol/L (ref 0–100)

## 2014-08-03 LAB — URINALYSIS, ROUTINE W REFLEX MICROSCOPIC
Bilirubin Urine: NEGATIVE
GLUCOSE, UA: NEGATIVE mg/dL
Hgb urine dipstick: NEGATIVE
KETONES UR: 15 mg/dL — AB
Leukocytes, UA: NEGATIVE
Nitrite: NEGATIVE
PH: 6 (ref 5.0–8.0)
Protein, ur: NEGATIVE mg/dL
Specific Gravity, Urine: 1.015 (ref 1.005–1.030)
Urobilinogen, UA: 1 mg/dL (ref 0.0–1.0)

## 2014-08-03 LAB — COMPREHENSIVE METABOLIC PANEL
ALK PHOS: 71 U/L (ref 39–117)
ALT: 8 U/L (ref 0–35)
AST: 17 U/L (ref 0–37)
Albumin: 3.4 g/dL — ABNORMAL LOW (ref 3.5–5.2)
Anion gap: 8 (ref 5–15)
BILIRUBIN TOTAL: 0.6 mg/dL (ref 0.3–1.2)
BUN: 15 mg/dL (ref 6–23)
CHLORIDE: 109 mmol/L (ref 96–112)
CO2: 25 mmol/L (ref 19–32)
Calcium: 8.7 mg/dL (ref 8.4–10.5)
Creatinine, Ser: 0.89 mg/dL (ref 0.50–1.10)
GFR calc Af Amer: 64 mL/min — ABNORMAL LOW (ref >90)
GFR, EST NON AFRICAN AMERICAN: 55 mL/min — AB (ref >90)
Glucose, Bld: 119 mg/dL — ABNORMAL HIGH (ref 70–99)
Potassium: 3.4 mmol/L — ABNORMAL LOW (ref 3.5–5.1)
SODIUM: 142 mmol/L (ref 135–145)
Total Protein: 6.1 g/dL (ref 6.0–8.3)

## 2014-08-03 LAB — MAGNESIUM: Magnesium: 2.1 mg/dL (ref 1.5–2.5)

## 2014-08-03 LAB — I-STAT TROPONIN, ED: TROPONIN I, POC: 0.03 ng/mL (ref 0.00–0.08)

## 2014-08-03 MED ORDER — SODIUM CHLORIDE 0.9 % IV BOLUS (SEPSIS)
500.0000 mL | Freq: Once | INTRAVENOUS | Status: AC
  Administered 2014-08-03: 500 mL via INTRAVENOUS

## 2014-08-03 NOTE — Assessment & Plan Note (Addendum)
Patient is alert and oriented with evidence of frank blood. Vital signs are stable and abdomen exam with no evidence of rigidity. Refer patient to the ED. EMS called and transported the patient without incident to [place] per her request.

## 2014-08-03 NOTE — H&P (Signed)
[facility] History and Physical  [name] MCN:[id number] DOB: 28-Jun-1921 DOA: 08/03/2014  Referring physician: EDP PCP: Olga Millers, MD   Chief Complaint: Blood in stool  HPI: [name] is a 81 y.o. female with past medical history of PSVT, history of upper GI bleed, hypertension, DJD presents to the ER with the above complaint. Patient reports feeling weak and tired over this last week however today at 2 PM in the afternoon when she went to the bathroom she had a bowel movement which was notable for dark red blood, this was followed by 2 episodes of black stools, she went and saw her PCP and was referred to the ER. She uses aspirin chronically, denies NSAID, or alcohol use. She denies any weight loss, cannot recall if she had a colonoscopy before but last endoscopy in 2014 in the setting of GI bleed showed evidence of gastritis. Upon initial presentation to the ER she was noted to have SVT with heart rates in the 140s to 150s range with normal blood pressure this spontaneously corrected to normal sinus rhythm without any medications, she took her a.m. dose of metoprolol today.   Review of Systems: Positives bolded Constitutional:  No weight loss, night sweats, Fevers, chills, fatigue.  HEENT:  No headaches, Difficulty swallowing,Tooth/dental problems,Sore throat,  No sneezing, itching, ear ache, nasal congestion, post nasal drip,  Cardio-vascular:  No chest pain, Orthopnea, PND, swelling in lower extremities, anasarca, dizziness, palpitations  GI:   heartburn, indigestion, abdominal pain, nausea, vomiting, diarrhea, change in bowel habits, loss of appetite  Resp:  No shortness of breath with exertion or at rest. No excess mucus, no productive cough, No non-productive cough, No coughing up of blood.No change in color of mucus.No wheezing.No chest wall deformity  Skin:  no rash or lesions.  GU:  no dysuria, change in color of urine, no urgency or frequency.  No flank pain.  Musculoskeletal:  No joint pain or swelling. No decreased range of motion. No back pain.  Psych:  No change in mood or affect. No depression or anxiety. No memory loss.   Past Medical History  Diagnosis Date  . Hypertension   . Cancer   . Glaucoma   . Allergy   . Arthritis   . Ulcer   . Complication of anesthesia     shortness of breath after procedure   Past Surgical History  Procedure Laterality Date  . Cholecystectomy    . Breast surgery  2011  . Breast surgery  2012    lt breast masty  . Abdominal hysterectomy    . Esophagogastroduodenoscopy N/A 01/25/2013    Procedure: ESOPHAGOGASTRODUODENOSCOPY (EGD);  Surgeon: Jerene Bears, MD;  Location: [place];  Service: Gastroenterology;  Laterality: N/A;   Social History:  reports that she has never smoked. She has never used smokeless tobacco. She reports that she does not drink alcohol or use illicit drugs.  Allergies  Allergen Reactions  . Codeine     REACTION: causes nausea and vomiting    Family History  Problem Relation Age of Onset  . Coronary artery disease Mother 39    MI    Prior to Admission medications   Medication Sig Start Date End Date Taking? Authorizing Provider  acetaminophen (TYLENOL) 325 MG tablet Take 325 mg by mouth every 6 (six) hours as needed for pain.   Yes Historical Provider, MD  aspirin EC 81 MG EC tablet Take 1 tablet (81 mg total) by mouth daily. 02/25/14  Yes Delfina Redwood, MD  diclofenac sodium (VOLTAREN) 1 % GEL Apply 2 g topically 4 (four) times daily. 07/25/14  Yes Olga Millers, MD  feeding supplement, RESOURCE BREEZE, (RESOURCE BREEZE) LIQD Take 1 Container by mouth 3 (three) times daily between meals. 03/31/14  Yes Olga Millers, MD  lisinopril (PRINIVIL,ZESTRIL) 40 MG tablet Take 1 tablet (40 mg total) by mouth daily. 03/31/14  Yes Olga Millers, MD  metoprolol tartrate (LOPRESSOR) 25 MG tablet Take 1 tablet (25 mg total) by mouth 2 (two) times  daily. 03/31/14  Yes Olga Millers, MD  pantoprazole (PROTONIX) 40 MG tablet Take 1 tablet (40 mg total) by mouth daily. 03/31/14  Yes Olga Millers, MD  travoprost, benzalkonium, (TRAVATAN) 0.004 % ophthalmic solution Place 1 drop into both eyes at bedtime.   Yes Historical Provider, MD  trolamine salicylate (ASPERCREME) 10 % cream Apply 1 application topically as needed for muscle pain (knees, back).   Yes Historical Provider, MD   Physical Exam: Filed Vitals:   08/03/14 1745 08/03/14 1808 08/03/14 1815 08/03/14 1830  BP: 129/91 163/86 126/73 122/70  Pulse: 56 121 82 74  TempSrc:      Resp: 20 22    SpO2: 98% 99% 97% 99%    Wt Readings from Last 3 Encounters:  07/25/14 68.548 kg (151 lb 1.9 oz)  04/26/14 72.666 kg (160 lb 3.2 oz)  03/31/14 73.573 kg (162 lb 3.2 oz)    General:  Elderly female appears calm and comfortable no distress Eyes: PERRL, normal lids, irises & conjunctiva ENT: grossly normal hearing, lips & tongue Neck: no LAD, masses or thyromegaly Cardiovascular: RRR, no m/r/g. No LE edema. Telemetry: SR, no arrhythmias  Respiratory: CTA bilaterally, no w/r/r. Normal respiratory effort. Abdomen: soft, ntnd, bowel sounds present no organomegaly Skin: no rash or induration seen on limited exam Musculoskeletal: grossly normal tone BUE/BLE Psychiatric: grossly normal mood and affect, speech fluent and appropriate Neurologic: grossly non-focal.          Labs on Admission:  Basic Metabolic Panel:  Recent Labs Lab 08/03/14 1743  NA 140  K 5.5*  CL 106  GLUCOSE 130*  BUN 27*  CREATININE 0.90   Liver Function Tests: No results for input(s): AST, ALT, ALKPHOS, BILITOT, PROT, ALBUMIN in the last 168 hours. No results for input(s): LIPASE, AMYLASE in the last 168 hours. No results for input(s): AMMONIA in the last 168 hours. CBC:  Recent Labs Lab 08/03/14 1705 08/03/14 1743  WBC 8.4  --   NEUTROABS 6.4  --   HGB 11.0* 12.2  HCT 33.9* 36.0  MCV  84.3  --   PLT 340  --    Cardiac Enzymes: No results for input(s): CKTOTAL, CKMB, CKMBINDEX, TROPONINI in the last 168 hours.  BNP (last 3 results) No results for input(s): BNP in the last 8760 hours.  ProBNP (last 3 results)  Recent Labs  02/23/14 1757  PROBNP 154.1    CBG: No results for input(s): GLUCAP in the last 168 hours.  Radiological Exams on Admission: Dg Chest Portable 1 View  08/03/2014   CLINICAL DATA:  Tachycardia.  History of breast cancer.  EXAM: PORTABLE CHEST - 1 VIEW  COMPARISON:  PA and lateral chest 04/23/2014.  FINDINGS: The lungs are clear. Heart size is upper normal. Ectatic aorta is noted. Marked degenerative change about the right shoulder is seen.  IMPRESSION: No acute disease.   Electronically Signed   By: Inge Rise M.D.  On: 08/03/2014 17:55    EKG: Independently reviewed. Supraventricular rhythm noted, LVH and repolarization abnormalities  Assessment/Plan    1. Melena/Upper GI bleed  -long h/o GERD  -Iv PPI Q12 -h/o EGD in 2014 which showed gastritis -[place]  GI consulted, Dr. Henrene Pastor will see patient in a.m.  - monitor CBC every 8, transfuse PRN, hold ASA  2. SVT-acute on chronic -Now resolved, continue PO metoprolol, check mag and repeat Bmet since specimen was hemolyzed -If recurs could use IV metoprolol or esmolol -Also check 2-D echo  3. HTN -hold lisinopril  4. Hyperkalemia -Lab specimen was hemolyzed will repeat and hold lisinopril for now   5 DJD/chronic pain -Tylenol when necessary and topical analgesics .   Code Status: DNR per patient DVT Prophylaxis: SCDs Family Communication: d/w daughter at bedside Disposition Plan:admit to SDU  Time spent: 29min   Sheza Strickland [facility] Pager [phone]

## 2014-08-03 NOTE — ED Notes (Signed)
Lab called CMP and Magnesium hemolyzed and needs to be reordered and redrawn.

## 2014-08-03 NOTE — ED Provider Notes (Signed)
CSN: [id number]     Arrival date & time 08/03/14  1656 History   First MD Initiated Contact with Patient 08/03/14 1700     Chief Complaint  Patient presents with  . Blood In Stools     (Consider location/radiation/quality/duration/timing/severity/associated sxs/prior Treatment) The history is provided by the patient.   patient presents with GI bleeding. Seen at her PCP and sent in. States she has had one episode of a little bit of blood in the stools then followed by 2 black stools. States she's felt bad for a few days. No chest pain. No trouble Breathing. States she does have some aches. No chest pain. No other bleeding. No real abdominal pain. No history of GI bleeding. Upon arrival patient was found to be in SVT. Has had a history of same. She will go in and out of it. And she cannot tell when her heart rate is going fast.  Past Medical History  Diagnosis Date  . Hypertension   . Cancer 2011, 2012    left breast cancer 2011 central excision,2012 mastectomy  . Glaucoma   . Arthritis   . Upper GI bleed 01/2013.     Melena. HPylori negative gastritis on EGD.   [name] Complication of anesthesia     shortness of breath after procedure  . SVT (supraventricular tachycardia) 03/2014  . Protein-calorie malnutrition, severe 03/2014   Past Surgical History  Procedure Laterality Date  . Cholecystectomy    . Breast surgery  2011  . Breast surgery  2012    lt breast masty  . Abdominal hysterectomy    . Esophagogastroduodenoscopy N/A 01/25/2013    Procedure: ESOPHAGOGASTRODUODENOSCOPY (EGD);  Surgeon: Jerene Bears, MD;  Location: [facility];  Service: Gastroenterology;  Laterality: N/A;   Family History  Problem Relation Age of Onset  . Coronary artery disease Mother 35    MI   History  Substance Use Topics  . Smoking status: Never Smoker   . Smokeless tobacco: Never Used  . Alcohol Use: No   OB History    No data available     Review of Systems  Constitutional: Positive for  fatigue. Negative for activity change and appetite change.  Eyes: Negative for pain.  Respiratory: Negative for chest tightness and shortness of breath.   Cardiovascular: Negative for chest pain, palpitations and leg swelling.  Gastrointestinal: Positive for blood in stool. Negative for nausea, vomiting, abdominal pain and diarrhea.  Genitourinary: Negative for flank pain.  Musculoskeletal: Positive for myalgias. Negative for back pain and neck stiffness.  Skin: Negative for rash.  Neurological: Negative for weakness, numbness and headaches.  Psychiatric/Behavioral: Negative for behavioral problems.      Allergies  Codeine  Home Medications   Prior to Admission medications   Medication Sig Start Date End Date Taking? Authorizing Provider  acetaminophen (TYLENOL) 325 MG tablet Take 325 mg by mouth every 6 (six) hours as needed for pain.   Yes Historical Provider, MD  aspirin EC 81 MG EC tablet Take 1 tablet (81 mg total) by mouth daily. 02/25/14  Yes Delfina Redwood, MD  diclofenac sodium (VOLTAREN) 1 % GEL Apply 2 g topically 4 (four) times daily. 07/25/14  Yes Olga Millers, MD  feeding supplement, RESOURCE BREEZE, (RESOURCE BREEZE) LIQD Take 1 Container by mouth 3 (three) times daily between meals. 03/31/14  Yes Olga Millers, MD  lisinopril (PRINIVIL,ZESTRIL) 40 MG tablet Take 1 tablet (40 mg total) by mouth daily. 03/31/14  Yes Olga Millers, MD  metoprolol tartrate (LOPRESSOR) 25 MG tablet Take 1 tablet (25 mg total) by mouth 2 (two) times daily. 03/31/14  Yes Olga Millers, MD  pantoprazole (PROTONIX) 40 MG tablet Take 1 tablet (40 mg total) by mouth daily. 03/31/14  Yes Olga Millers, MD  travoprost, benzalkonium, (TRAVATAN) 0.004 % ophthalmic solution Place 1 drop into both eyes at bedtime.   Yes Historical Provider, MD  trolamine salicylate (ASPERCREME) 10 % cream Apply 1 application topically as needed for muscle pain (knees, back).   Yes Historical  Provider, MD   BP 147/70 mmHg  Pulse 91  Temp(Src) 98.5 F (36.9 C) (Oral)  Resp 20  SpO2 99% Physical Exam  Constitutional: She appears well-developed and well-nourished.  HENT:  Head: Normocephalic and atraumatic.  Eyes: EOM are normal. Pupils are equal, round, and reactive to light.  Neck: Normal range of motion. Neck supple.  Cardiovascular: Regular rhythm and normal heart sounds.   No murmur heard. Tachycardia  Pulmonary/Chest: Effort normal and breath sounds normal. No respiratory distress. She has no wheezes. She has no rales.  Abdominal: Soft. Bowel sounds are normal. She exhibits no distension. There is no tenderness.  Musculoskeletal: Normal range of motion.  Neurological: She is alert. No cranial nerve deficit.  Skin: Skin is warm.  Psychiatric: She has a normal mood and affect. Her speech is normal.  Nursing note and vitals reviewed.   ED Course  Procedures (including critical care time) Labs Review Labs Reviewed  URINALYSIS, ROUTINE W REFLEX MICROSCOPIC - Abnormal; Notable for the following:    Ketones, ur 15 (*)    All other components within normal limits  CBC WITH DIFFERENTIAL/PLATELET - Abnormal; Notable for the following:    Hemoglobin 11.0 (*)    HCT 33.9 (*)    All other components within normal limits  COMPREHENSIVE METABOLIC PANEL - Abnormal; Notable for the following:    Potassium 3.4 (*)    Glucose, Bld 119 (*)    Albumin 3.4 (*)    GFR calc non Af Amer 55 (*)    GFR calc Af Amer 64 (*)    All other components within normal limits  BASIC METABOLIC PANEL - Abnormal; Notable for the following:    Glucose, Bld 108 (*)    Calcium 8.3 (*)    GFR calc non Af Amer 63 (*)    GFR calc Af Amer 73 (*)    All other components within normal limits  CBC - Abnormal; Notable for the following:    RBC 3.19 (*)    Hemoglobin 8.8 (*)    HCT 26.8 (*)    All other components within normal limits  CBC - Abnormal; Notable for the following:    RBC 3.15 (*)     Hemoglobin 8.8 (*)    HCT 27.2 (*)    All other components within normal limits  CBC - Abnormal; Notable for the following:    RBC 2.88 (*)    Hemoglobin 8.0 (*)    HCT 24.3 (*)    All other components within normal limits  I-STAT CHEM 8, ED - Abnormal; Notable for the following:    Potassium 5.5 (*)    BUN 27 (*)    Glucose, Bld 130 (*)    Calcium, Ion 1.07 (*)    All other components within normal limits  MAGNESIUM  CBC  BASIC METABOLIC PANEL  CBC  CBC  I-STAT CG4 LACTIC ACID, ED  POC OCCULT BLOOD, ED  Randolm Idol, ED  TYPE AND SCREEN    Imaging Review Dg Chest Portable 1 View  08/03/2014   CLINICAL DATA:  Tachycardia.  History of breast cancer.  EXAM: PORTABLE CHEST - 1 VIEW  COMPARISON:  PA and lateral chest 04/23/2014.  FINDINGS: The lungs are clear. Heart size is upper normal. Ectatic aorta is noted. Marked degenerative change about the right shoulder is seen.  IMPRESSION: No acute disease.   Electronically Signed   By: Inge Rise M.D.   On: 08/03/2014 17:55     EKG Interpretation   Date/Time:  Wednesday August 03 2014 16:59:37 EST Ventricular Rate:  149 PR Interval:    QRS Duration: 96 QT Interval:  331 QTC Calculation: 521 R Axis:   -81 Text Interpretation:  Supraventricular tachycardia Left anterior  fascicular block Abnormal R-wave progression, early transition LVH with  secondary repolarization abnormality ST depression, probably rate related  Artifact in lead(s) I II aVR aVL V1 Confirmed by Alvino Chapel  MD, Ovid Curd  [phone]) on 08/03/2014 5:15:43 PM      MDM   Final diagnoses:  Gastrointestinal hemorrhage, unspecified gastritis, unspecified gastrointestinal hemorrhage type    Patient with GI bleed. Frank red blood in stool. History of bleeding in the past. Was gastritis that time. Also found to be in SVT here. IV fluids given that thought to be due to the underlying GI bleed so did not aggressively treated. It resolved on its own. Admitted to step  down bed. CRITICAL CARE Performed by: Mackie Pai Total critical care time: 30 Critical care time was exclusive of separately billable procedures and treating other patients. Critical care was necessary to treat or prevent imminent or life-threatening deterioration. Critical care was time spent personally by me on the following activities: development of treatment plan with patient and/or surrogate as well as nursing, discussions with consultants, evaluation of patient's response to treatment, examination of patient, obtaining history from patient or surrogate, ordering and performing treatments and interventions, ordering and review of laboratory studies, ordering and review of radiographic studies, pulse oximetry and re-evaluation of patient's condition.       Jasper Riling. Alvino Chapel, MD 08/05/14 1017

## 2014-08-03 NOTE — Patient Instructions (Signed)
Thank you for choosing [facility].  Summary/Instructions:  Rectal Bleeding Rectal bleeding is when blood passes out of the anus. It is usually a sign that something is wrong. It may not be serious, but it should always be evaluated. Rectal bleeding may present as bright red blood or extremely dark stools. The color may range from dark red or maroon to black (like tar). It is important that the cause of rectal bleeding be identified so treatment can be started and the problem corrected. CAUSES   Hemorrhoids. These are enlarged (dilated) blood vessels or veins in the anal or rectal area.  Fistulas. Theseare abnormal, burrowing channels that usually run from inside the rectum to the skin around the anus. They can bleed.  Anal fissures. This is a tear in the tissue of the anus. Bleeding occurs with bowel movements.  Diverticulosis. This is a condition in which pockets or sacs project from the bowel wall. Occasionally, the sacs can bleed.  Diverticulitis. Thisis an infection involving diverticulosis of the colon.  Proctitis and colitis. These are conditions in which the rectum, colon, or both, can become inflamed and pitted (ulcerated).  Polyps and cancer. Polyps are non-cancerous (benign) growths in the colon that may bleed. Certain types of polyps turn into cancer.  Protrusion of the rectum. Part of the rectum can project from the anus and bleed.  Certain medicines.  Intestinal infections.  Blood vessel abnormalities. HOME CARE INSTRUCTIONS  Eat a high-fiber diet to keep your stool soft.  Limit activity.  Drink enough fluids to keep your urine clear or pale yellow.  Warm baths may be useful to soothe rectal pain.  Follow up with your caregiver as directed. SEEK IMMEDIATE MEDICAL CARE IF:  You develop increased bleeding.  You have black or dark red stools.  You vomit blood or material that looks like coffee grounds.  You have abdominal pain or tenderness.  You  have a fever.  You feel weak, nauseous, or you faint.  You have severe rectal pain or you are unable to have a bowel movement. MAKE SURE YOU:  Understand these instructions.  Will watch your condition.  Will get help right away if you are not doing well or get worse. Document Released: 11/09/2001 Document Revised: 08/12/2011 Document Reviewed: 11/04/2010 [facility] Patient Information 2015 [place], [place]. This information is not intended to replace advice given to you by your health care provider. Make sure you discuss any questions you have with your health care provider.

## 2014-08-03 NOTE — ED Notes (Signed)
Per EMS: Pt reports going to doctor today for appointment and today at 1400 pt has had 3 episodes of bright red blood in stool and also dark tarry stools.  Pt has nausea and also c/o constipation.  Pt is not on blood thinners, has had a GI bleed.  Pt on arrival has SVT on arrival, with hx of SVT.  Pt alert and oriented. 70hr, 131/72, 98% cbg 143.  Pt has no SOB or CP.

## 2014-08-03 NOTE — Progress Notes (Signed)
Pre visit review using our clinic review tool, if applicable. No additional management support is needed unless otherwise documented below in the visit note. 

## 2014-08-03 NOTE — ED Notes (Signed)
Notifed dr. Alvino Chapel that pt hr was in 150's.

## 2014-08-03 NOTE — Progress Notes (Signed)
   Subjective:    Patient ID: [name], female    DOB: May 28, 1922, 81 y.o.   MRN: [id number]  Chief Complaint  Patient presents with  . Acute Visit    RECTAL BLEEDING    HPI:  [name] is a 81 y.o. female who presents today for an acute visit.  Patient came into the office indicating that she was experiencing the associated symptoms of rectal bleeding described as dark. She has had 3 bowel movements of blood today. Has not tried any treatments for the condition.    Allergies  Allergen Reactions  . Codeine     REACTION: causes nausea and vomiting    Current Outpatient Prescriptions on File Prior to Visit  Medication Sig Dispense Refill  . acetaminophen (TYLENOL) 325 MG tablet Take 325 mg by mouth every 6 (six) hours as needed for pain.    [name] aspirin EC 81 MG EC tablet Take 1 tablet (81 mg total) by mouth daily.    . diclofenac sodium (VOLTAREN) 1 % GEL Apply 2 g topically 4 (four) times daily. 100 g 2  . feeding supplement, RESOURCE BREEZE, (RESOURCE BREEZE) LIQD Take 1 Container by mouth 3 (three) times daily between meals. 240 mL 12  . lisinopril (PRINIVIL,ZESTRIL) 40 MG tablet Take 1 tablet (40 mg total) by mouth daily. 30 tablet 11  . metoprolol tartrate (LOPRESSOR) 25 MG tablet Take 1 tablet (25 mg total) by mouth 2 (two) times daily. 60 tablet 6  . pantoprazole (PROTONIX) 40 MG tablet Take 1 tablet (40 mg total) by mouth daily. 30 tablet 11  . travoprost, benzalkonium, (TRAVATAN) 0.004 % ophthalmic solution Place 1 drop into both eyes at bedtime.    . trolamine salicylate (ASPERCREME) 10 % cream Apply 1 application topically as needed for muscle pain (knees, back).     No current facility-administered medications on file prior to visit.    Review of Systems  Constitutional: Negative for fever and diaphoresis.  Gastrointestinal: Positive for nausea, constipation and blood in stool. Negative for abdominal distention.      Objective:    BP 142/82 mmHg   Pulse 51  Temp(Src) 98.2 F (36.8 C) (Oral)  Resp 12  SpO2 98% Nursing note and vital signs reviewed.  Physical Exam  Constitutional: She is oriented to person, place, and time. She appears well-developed and well-nourished. No distress.  Cardiovascular: Normal rate, regular rhythm, normal heart sounds and intact distal pulses.   Pulmonary/Chest: Effort normal and breath sounds normal.  Abdominal: Normal appearance and bowel sounds are normal. There is tenderness in the right lower quadrant, suprapubic area and left upper quadrant. There is no rigidity, no rebound, no guarding, no tenderness at McBurney's point and negative Murphy's sign.  Neurological: She is alert and oriented to person, place, and time.  Skin: Skin is warm and dry.  Psychiatric: She has a normal mood and affect. Her behavior is normal. Judgment and thought content normal.       Assessment & Plan:

## 2014-08-04 ENCOUNTER — Encounter (HOSPITAL_COMMUNITY)
Admission: EM | Disposition: A | Discharge status: Rehabilitation Facility | Payer: Self-pay | Source: Home / Self Care | Attending: Internal Medicine

## 2014-08-04 ENCOUNTER — Inpatient Hospital Stay (HOSPITAL_COMMUNITY): Payer: Medicare Other | Admitting: Certified Registered Nurse Anesthetist

## 2014-08-04 ENCOUNTER — Encounter (HOSPITAL_COMMUNITY): Payer: Self-pay | Admitting: Physician Assistant

## 2014-08-04 DIAGNOSIS — M47899 Other spondylosis, site unspecified: Secondary | ICD-10-CM

## 2014-08-04 DIAGNOSIS — D62 Acute posthemorrhagic anemia: Secondary | ICD-10-CM

## 2014-08-04 DIAGNOSIS — I359 Nonrheumatic aortic valve disorder, unspecified: Secondary | ICD-10-CM

## 2014-08-04 DIAGNOSIS — K922 Gastrointestinal hemorrhage, unspecified: Secondary | ICD-10-CM | POA: Diagnosis present

## 2014-08-04 HISTORY — PX: ESOPHAGOGASTRODUODENOSCOPY (EGD) WITH PROPOFOL: SHX5813

## 2014-08-04 LAB — BASIC METABOLIC PANEL
ANION GAP: 7 (ref 5–15)
BUN: 19 mg/dL (ref 6–23)
CALCIUM: 8.3 mg/dL — AB (ref 8.4–10.5)
CO2: 26 mmol/L (ref 19–32)
Chloride: 108 mmol/L (ref 96–112)
Creatinine, Ser: 0.8 mg/dL (ref 0.50–1.10)
GFR calc Af Amer: 73 mL/min — ABNORMAL LOW (ref >90)
GFR calc non Af Amer: 63 mL/min — ABNORMAL LOW (ref >90)
Glucose, Bld: 108 mg/dL — ABNORMAL HIGH (ref 70–99)
Potassium: 3.7 mmol/L (ref 3.5–5.1)
SODIUM: 141 mmol/L (ref 135–145)

## 2014-08-04 LAB — CBC
HCT: 27.2 % — ABNORMAL LOW (ref 36.0–46.0)
HCT: 24.3 % — ABNORMAL LOW (ref 36.0–46.0)
HEMATOCRIT: 26.8 % — AB (ref 36.0–46.0)
HEMOGLOBIN: 8.8 g/dL — AB (ref 12.0–15.0)
HEMOGLOBIN: 8.8 g/dL — AB (ref 12.0–15.0)
Hemoglobin: 8 g/dL — ABNORMAL LOW (ref 12.0–15.0)
MCH: 27.6 pg (ref 26.0–34.0)
MCH: 27.9 pg (ref 26.0–34.0)
MCH: 27.8 pg (ref 26.0–34.0)
MCHC: 32.8 g/dL (ref 30.0–36.0)
MCHC: 32.4 g/dL (ref 30.0–36.0)
MCHC: 32.9 g/dL (ref 30.0–36.0)
MCV: 84 fL (ref 78.0–100.0)
MCV: 86.3 fL (ref 78.0–100.0)
MCV: 84.4 fL (ref 78.0–100.0)
PLATELETS: 257 10*3/uL (ref 150–400)
Platelets: 273 10*3/uL (ref 150–400)
Platelets: 257 10*3/uL (ref 150–400)
RBC: 3.19 MIL/uL — AB (ref 3.87–5.11)
RBC: 3.15 MIL/uL — ABNORMAL LOW (ref 3.87–5.11)
RBC: 2.88 MIL/uL — ABNORMAL LOW (ref 3.87–5.11)
RDW: 14.9 % (ref 11.5–15.5)
RDW: 15 % (ref 11.5–15.5)
RDW: 14.9 % (ref 11.5–15.5)
WBC: 7.7 10*3/uL (ref 4.0–10.5)
WBC: 7.7 10*3/uL (ref 4.0–10.5)
WBC: 6.9 10*3/uL (ref 4.0–10.5)

## 2014-08-04 SURGERY — ESOPHAGOGASTRODUODENOSCOPY (EGD) WITH PROPOFOL
Anesthesia: Monitor Anesthesia Care

## 2014-08-04 MED ORDER — FENTANYL CITRATE 0.05 MG/ML IJ SOLN: 25.0000 ug | INTRAMUSCULAR | Status: DC | PRN

## 2014-08-04 MED ORDER — METOPROLOL TARTRATE 25 MG PO TABS
25.0000 mg | ORAL_TABLET | Freq: Two times a day (BID) | ORAL | Status: DC
  Administered 2014-08-04: 25 mg via ORAL
  Filled 2014-08-04: qty 1

## 2014-08-04 MED ORDER — ACETAMINOPHEN 500 MG PO TABS: 500.0000 mg | ORAL_TABLET | Freq: Four times a day (QID) | ORAL | Status: DC | PRN

## 2014-08-04 MED ORDER — TRAVOPROST 0.004 % OP SOLN: 1.0000 [drp] | Freq: Every day | OPHTHALMIC | Status: DC

## 2014-08-04 MED ORDER — PROPOFOL 10 MG/ML IV BOLUS: INTRAVENOUS | Status: DC | PRN

## 2014-08-04 MED ORDER — PANTOPRAZOLE SODIUM 40 MG IV SOLR
40.0000 mg | Freq: Two times a day (BID) | INTRAVENOUS | Status: DC
Start: 2014-08-04 — End: 2014-08-04
  Administered 2014-08-04: 40 mg via INTRAVENOUS
  Filled 2014-08-04: qty 40

## 2014-08-04 MED ORDER — BUTAMBEN-TETRACAINE-BENZOCAINE 2-2-14 % EX AERO
INHALATION_SPRAY | CUTANEOUS | Status: DC | PRN
  Administered 2014-08-04: 2 via TOPICAL

## 2014-08-04 MED ORDER — ONDANSETRON HCL 4 MG PO TABS: 4.0000 mg | ORAL_TABLET | Freq: Four times a day (QID) | ORAL | Status: DC | PRN

## 2014-08-04 MED ORDER — PROPOFOL INFUSION 10 MG/ML OPTIME
INTRAVENOUS | Status: DC | PRN
  Administered 2014-08-04: 100 ug/kg/min via INTRAVENOUS

## 2014-08-04 MED ORDER — MUSCLE RUB 10-15 % EX CREA
TOPICAL_CREAM | CUTANEOUS | Status: DC | PRN
  Filled 2014-08-04: qty 85

## 2014-08-04 MED ORDER — PANTOPRAZOLE SODIUM 40 MG PO TBEC
40.0000 mg | DELAYED_RELEASE_TABLET | Freq: Every day | ORAL | Status: DC
  Administered 2014-08-06 – 2014-08-09 (×4): 40 mg via ORAL
  Filled 2014-08-04 (×4): qty 1

## 2014-08-04 MED ORDER — METOPROLOL TARTRATE 25 MG PO TABS
37.5000 mg | ORAL_TABLET | Freq: Two times a day (BID) | ORAL | Status: DC
  Administered 2014-08-04 – 2014-08-10 (×12): 37.5 mg via ORAL
  Filled 2014-08-04 (×24): qty 1

## 2014-08-04 MED ORDER — SODIUM CHLORIDE 0.9 % IV SOLN
INTRAVENOUS | Status: DC
  Administered 2014-08-04: 07:00:00 via INTRAVENOUS

## 2014-08-04 MED ORDER — TROLAMINE SALICYLATE 10 % EX CREA
1.0000 "application " | TOPICAL_CREAM | CUTANEOUS | Status: DC | PRN
  Filled 2014-08-04: qty 85

## 2014-08-04 MED ORDER — LATANOPROST 0.005 % OP SOLN
1.0000 [drp] | Freq: Every day | OPHTHALMIC | Status: DC
  Administered 2014-08-04 – 2014-08-09 (×5): 1 [drp] via OPHTHALMIC
  Filled 2014-08-04 (×2): qty 2.5

## 2014-08-04 MED ORDER — ONDANSETRON HCL 4 MG/2ML IJ SOLN: 4.0000 mg | Freq: Four times a day (QID) | INTRAMUSCULAR | Status: DC | PRN

## 2014-08-04 MED ORDER — LACTATED RINGERS IV SOLN
INTRAVENOUS | Status: DC | PRN
  Administered 2014-08-04: 15:00:00 via INTRAVENOUS

## 2014-08-04 MED ORDER — SODIUM CHLORIDE 0.9 % IV SOLN: INTRAVENOUS | Status: DC

## 2014-08-04 MED ORDER — SODIUM CHLORIDE 0.9 % IJ SOLN
3.0000 mL | Freq: Two times a day (BID) | INTRAMUSCULAR | Status: DC
  Administered 2014-08-04 – 2014-08-10 (×9): 3 mL via INTRAVENOUS

## 2014-08-04 MED ORDER — LACTATED RINGERS IV SOLN
INTRAVENOUS | Status: DC
  Administered 2014-08-04: 1000 mL via INTRAVENOUS

## 2014-08-04 NOTE — Anesthesia Postprocedure Evaluation (Signed)
  Anesthesia Post-op Note  Patient: [name]  Procedure(s) Performed: Procedure(s): ESOPHAGOGASTRODUODENOSCOPY (EGD) WITH PROPOFOL  Patient Location: PACU  Anesthesia Type: MAC  Level of Consciousness: awake and alert   Airway and Oxygen Therapy: Patient Spontanous Breathing  Post-op Pain: none  Post-op Assessment: Post-op Vital signs reviewed, Patient's Cardiovascular Status Stable and Respiratory Function Stable  Post-op Vital Signs: Reviewed  Filed Vitals:   08/04/14 1550  BP: 178/113  Pulse: 73  Temp:   Resp: 28    Complications: No apparent anesthesia complications

## 2014-08-04 NOTE — Progress Notes (Signed)
Upper endoscopy was normal except for incidental esophageal webs.  The suspect she is bleeding distal to the ligament of Treitz.  She has a history of diverticulosis as determined by CT scan in 2012.  At her age, didn't to do a colonoscopy.  Recommend CT the abdomen and pelvis looking for any gross colonic abnormalities.  If negative I would obtain a Cologuard to rule out advanced colonic neoplasm

## 2014-08-04 NOTE — Transfer of Care (Signed)
Immediate Anesthesia Transfer of Care Note  Patient: [name]  Procedure(s) Performed: Procedure(s): ESOPHAGOGASTRODUODENOSCOPY (EGD) WITH PROPOFOL  Patient Location: PACU and Endoscopy Unit  Anesthesia Type:MAC  Level of Consciousness: awake, alert , oriented and patient cooperative  Airway & Oxygen Therapy: Patient Spontanous Breathing and Patient connected to nasal cannula oxygen  Post-op Assessment: Report given to RN, Post -op Vital signs reviewed and stable and Patient moving all extremities  Post vital signs: Reviewed and stable  Last Vitals:  Filed Vitals:   08/04/14 1546  BP: 164/99  Pulse: 77  Temp:   Resp: 19    Complications: No apparent anesthesia complications

## 2014-08-04 NOTE — Progress Notes (Signed)
  Echocardiogram 2D Echocardiogram has been performed.  [name] 08/04/2014, 6:03 PM

## 2014-08-04 NOTE — Anesthesia Procedure Notes (Signed)
Procedure Name: MAC Date/Time: 08/04/2014 1:21 PM Performed by: Ned Grace Pre-anesthesia Checklist: Patient identified, Timeout performed, Emergency Drugs available, Suction available and Patient being monitored Patient Re-evaluated:Patient Re-evaluated prior to inductionOxygen Delivery Method: Nasal cannula Intubation Type: IV induction

## 2014-08-04 NOTE — ED Notes (Signed)
Admission md at bedside

## 2014-08-04 NOTE — Progress Notes (Signed)
Received report from Endo lab reporting 2 episodes prior to EGD and one after lasting approx 33min. HR in the 140's. Last episode 1440 according to monitor. Pt back to room and at 1622  pt back in SVT approx 1 minute with HR in the 140's and spontaneously converted to SR in the 70's will continue to observe

## 2014-08-04 NOTE — Op Note (Signed)
[facility] [place], [address]   ENDOSCOPY PROCEDURE REPORT  PATIENT: [name], [name]  MR#: [id number] BIRTHDATE: Nov 16, 1921 , 89  yrs. old GENDER: female ENDOSCOPIST: Inda Castle, MD REFERRED BY: PROCEDURE DATE:  08/04/2014 PROCEDURE:  EGD, diagnostic ASA CLASS:     Class III INDICATIONS:  melena. MEDICATIONS: Monitored anesthesia care TOPICAL ANESTHETIC:  DESCRIPTION OF PROCEDURE: After the risks benefits and alternatives of the procedure were thoroughly explained, informed consent was obtained.  The Pentax Gastroscope [id number] endoscope was introduced through the mouth and advanced to the second portion of the duodenum , Without limitations.  The instrument was slowly withdrawn as the mucosa was fully examined.    Esophageal web. There were at least 2 nonobstructing webs in the body of the esophagus.  There was no fresh or old blood in the upper GI tract.  Except for the findings listed the EGD was otherwise normal.  Retroflexed views revealed no abnormalities. The scope was then withdrawn from the patient and the procedure completed.  COMPLICATIONS: There were no immediate complications.  ENDOSCOPIC IMPRESSION: 1.   Esophageal web 2.   EGD was otherwise normal  RECOMMENDATIONS: CT of the abdomen and pelvis to rule out any gross colonic abnormalities  REPEAT EXAM:  eSigned:  Inda Castle, MD 08/04/2014 3:46 PM    CC: Vertell Novak, MD

## 2014-08-04 NOTE — ED Notes (Signed)
Pt resting quietly with eyes closed at this time.

## 2014-08-04 NOTE — Anesthesia Preprocedure Evaluation (Addendum)
Anesthesia Evaluation  Patient identified by MRN, date of birth, ID band Patient awake  General Assessment Comment:Pt reports shortness of breath following previous anesthetics  Reviewed: Allergy & Precautions, H&P , NPO status , Patient's Chart, lab work & pertinent test results, reviewed documented beta blocker date and time   History of Anesthesia Complications (+) history of anesthetic complications  Airway Mallampati: II  TM Distance: >3 FB Neck ROM: Full    Dental no notable dental hx. (+) Dental Advisory Given, Edentulous Upper   Pulmonary shortness of breath,  breath sounds clear to auscultation  Pulmonary exam normal       Cardiovascular hypertension, Pt. on medications and Pt. on home beta blockers + dysrhythmias Supra Ventricular Tachycardia Rhythm:Regular Rate:Normal     Neuro/Psych negative neurological ROS  negative psych ROS   GI/Hepatic Neg liver ROS, GERD-  Medicated and Controlled,  Endo/Other  negative endocrine ROS  Renal/GU negative Renal ROS  negative genitourinary   Musculoskeletal  (+) Arthritis -,   Abdominal   Peds  Hematology negative hematology ROS (+) anemia ,   Anesthesia Other Findings   Reproductive/Obstetrics negative OB ROS                          Anesthesia Physical Anesthesia Plan  ASA: II  Anesthesia Plan: MAC   Post-op Pain Management:    Induction: Intravenous  Airway Management Planned: Nasal Cannula  Additional Equipment:   Intra-op Plan:   Post-operative Plan:   Informed Consent: I have reviewed the patients History and Physical, chart, labs and discussed the procedure including the risks, benefits and alternatives for the proposed anesthesia with the patient or authorized representative who has indicated his/her understanding and acceptance.   Dental advisory given  Plan Discussed with: CRNA  Anesthesia Plan Comments:          Anesthesia Quick Evaluation

## 2014-08-04 NOTE — Care Management Note (Unsigned)
    Page 1 of 1   08/10/2014     4:24:44 PM CARE MANAGEMENT NOTE 08/10/2014  Patient:  JO-ANNE, KLUTH   Account Number:  [id number]  Date Initiated:  08/04/2014  Documentation initiated by:  GRAVES-BIGELOW,Markitta Ausburn  Subjective/Objective Assessment:   Pt admitted for Melena/Upper GI bleed- SVT.     Action/Plan:   CM will monitor for disposition needs.   Anticipated DC Date:  08/06/2014   Anticipated DC Plan:        DC Planning Services  CM consult      Choice offered to / List presented to:             Status of service:  In process, will continue to follow Medicare Important Message given?  YES (If response is "NO", the following Medicare IM given date fields will be blank) Date Medicare IM given:  08/05/2014 Medicare IM given by:  GRAVES-BIGELOW,Elinda Bunten Date Additional Medicare IM given:  08/08/2014 Additional Medicare IM given by:  Kenisha Lynds GRAVES-BIGELOW  Discharge Disposition:  IP REHAB FACILITY  Per UR Regulation:  Reviewed for med. necessity/level of care/duration of stay  If discussed at [place] of Stay Meetings, dates discussed:   08/09/2014    Comments:  08-08-14 Jacqlyn Krauss, RN,BSN [phone] CIR consulting to see if pt is appropriate with SNF as an option as well. CM will continue to monitor.   08-05-14 [address] Jacqlyn Krauss, [place] [phone] CM was able to speak with family in regards to home. Pt lives alone and family checks in with her and takes to MD appointments. Pt has RW at home. CM will continue to monitor for disposition needs.

## 2014-08-04 NOTE — ED Notes (Signed)
Metoprolol and Protonix due at 10 am, Daily meds. To be given at scheduled time.

## 2014-08-04 NOTE — Progress Notes (Addendum)
[name] TEAM 1 - Stepdown/ICU TEAM Progress Note  [name] SAY:[id number] DOB: 28-Nov-1921 DOA: 08/03/2014 PCP: Barbara Millers, MD  Admit HPI / Brief Narrative: 81  Yo BF PMHx PSVT, history of upper GI bleed due to gastritis, hypertension, DJD, Presented to the ED from PCP office with dark stools for the past 2 days.  She states she has also been feeling lightheaded and weak for the past week.  She denies any dizziness, shortness of breath, chest pain, abdominal pain, hematemesis, hematochezia, or BRBPR. She is on a daily aspirin 81 mg, denies other NSAID or alcohol use. She had previous EGD 2014 with evidence of gastritis, and is on daily PPIs.  In ED, EKG with evidence of SVT with HR in the 140s-150s which spontaneously corrected,  Hgb stable 12.2. GI was consulted, and pt is admitted for further management.    HPI/Subjective: Alert and oriented 2 ( did not know where/when). Feels some lightheadedness. Has had one other episode of melena. Denies any dizziness shortness of breath chest pain or abdominal pain.  Assessment/Plan:  GI bleed -with recurrence of melena during hospitalization- likely upper with ASA use -Hgb 8.8 -EGD 2014- evidence of gastritis, no previous colonscopy -GI on board- EGD scheduled for 08/04/2014 -Continue PPI every 12 -Hold aspirin -Transfuse for CBC< 8, PRN  Hyperkalemia -resolved  Acute on chronic SVT -Several runs of SVT following EGD  -Continue daily metoprolol -2-D echo pending  Hypertension -Increase metoprolol to 37.5 BID  -hold lisinopril in setting of GIB  DJD Tylenol when necessary  History of breast cancer   Code Status: DNR Family Communication: multiple family present at time of exam Disposition Plan: SDU   Consultants: GI  Procedure/Significant Events: 08/04/14- EGD   Culture None  Antibiotics: None  DVT prophylaxis: SCDs   Devices None  LINES / TUBES:  None    Continuous Infusions: . sodium  chloride 75 mL/hr at 08/04/14 0631    Objective: VITAL SIGNS: Temp: 98.1 F (36.7 C) (03/02 2236) Temp Source: Oral (03/02 2236) BP: 155/81 mmHg (03/03 0920) Pulse Rate: 85 (03/03 0920) SPO2; FIO2:  No intake or output data in the 24 hours ending 08/04/14 1006   Exam: General: A/O 2 (did not know where, when) [name] appears younger than stated age, in NAD Lungs: Clear to auscultation bilaterally without wheezes or crackles Cardiovascular: Regular rate and rhythm without murmur gallop or rub normal S1 and S2 Abdomen: Nontender, nondistended, soft, bowel sounds positive, no rebound, no ascites, no appreciable mass Extremities: No significant cyanosis, clubbing, or edema bilateral lower extremities  Data Reviewed: Basic Metabolic Panel:  Recent Labs Lab 08/03/14 1743 08/03/14 1748 08/04/14 0638  NA 140 142 141  K 5.5* 3.4* 3.7  CL 106 109 108  CO2  --  25 26  GLUCOSE 130* 119* 108*  BUN 27* 15 19  CREATININE 0.90 0.89 0.80  CALCIUM  --  8.7 8.3*  MG  --  2.1  --    Liver Function Tests:  Recent Labs Lab 08/03/14 1748  AST 17  ALT 8  ALKPHOS 71  BILITOT 0.6  PROT 6.1  ALBUMIN 3.4*   No results for input(s): LIPASE, AMYLASE in the last 168 hours. No results for input(s): AMMONIA in the last 168 hours. CBC:  Recent Labs Lab 08/03/14 1705 08/03/14 1743 08/04/14 0638  WBC 8.4  --  7.7  NEUTROABS 6.4  --   --   HGB 11.0* 12.2 8.8*  HCT 33.9*  36.0 26.8*  MCV 84.3  --  84.0  PLT 340  --  273   Cardiac Enzymes: No results for input(s): CKTOTAL, CKMB, CKMBINDEX, TROPONINI in the last 168 hours. BNP (last 3 results) No results for input(s): BNP in the last 8760 hours.  ProBNP (last 3 results)  Recent Labs  02/23/14 1757  PROBNP 154.1    CBG: No results for input(s): GLUCAP in the last 168 hours.  No results found for this or any previous visit (from the past 240 hour(s)).   Studies:  Recent x-ray studies have been reviewed  in detail by the Attending Physician  Scheduled Meds:  Scheduled Meds: . metoprolol tartrate  25 mg Oral BID  . pantoprazole (PROTONIX) IV  40 mg Intravenous Q12H  . sodium chloride  3 mL Intravenous Q12H  . travoprost (benzalkonium)  1 drop Both Eyes QHS    Time spent on care of this patient: 40 mins   [name] , [name]  [name] Office  [phone] Pager - [phone]  On-Call/Text Page:      [name]      password [email or web address]  If 7PM-7AM, please contact night-coverage [email or web address] Password [email or web address] 08/04/2014, 10:06 AM   LOS: 1 day   Examined Patient and discussed A&P with PA [name] and agree with above plan. I have reviewed the entire database. I have made any necessary editorial changes, and agree with its content. I have reviewed this patient's available data, including medical history, events of note, physical examination, radiology studies and test results as part of my evaluation Pt with Multiple Complex medical problems> 35 min spent in direct Pt care    Barbara Crawford, MD  [name]  [phone] pager

## 2014-08-04 NOTE — ED Notes (Signed)
GI PA at bedside. 

## 2014-08-04 NOTE — Consult Note (Signed)
[facility] Consult: 9:31 AM 08/04/2014  LOS: 1 day    Referring Provider: Dr Sherral Hammers  Primary Care Physician:  Olga Millers, MD Primary Gastroenterologist:  Dr. Hilarie Fredrickson.      Reason for Consultation:  GI bleed.    HPI: [name] is a 81 y.o. female.  Hx htn, SVT, left breast cancer/mastectomy 2012, DJD, CKD, glaucoma.   GI bleed in 01/2013 (melena) in setting of daily Mobic, ASA.  EGD showed gastritis. Hgb stable at ~ 12.5 so did not require transfusion. Currently on Protonix once daily.   Admitted from ED after presenting with at least 3 episodes of dark red, bloody then black stools in the afternoon.   Hgb dropped from 11 to 8 in less than 24 hours. Baaseline is ~ 12.5.  BUN elevated to 27, from 15 in fall 2015.  Coags normal Started on BID IV Protonix.   AT home on 81 ASA and topical ASA and topical Voltaren and the Protonix.  No abdominal pain or nausea.    Past Medical History  Diagnosis Date  . Hypertension   . Cancer 2011, 2012    left breast cancer 2011 central excision,2012 mastectomy  . Glaucoma   . Arthritis   . Upper GI bleed 01/2013.     Melena. HPylori negative gastritis on EGD.   [name] Complication of anesthesia     shortness of breath after procedure  . SVT (supraventricular tachycardia) 03/2014  . Protein-calorie malnutrition, severe 03/2014    Past Surgical History  Procedure Laterality Date  . Cholecystectomy    . Breast surgery  2011  . Breast surgery  2012    lt breast masty  . Abdominal hysterectomy    . Esophagogastroduodenoscopy N/A 01/25/2013    Procedure: ESOPHAGOGASTRODUODENOSCOPY (EGD);  Surgeon: Jerene Bears, MD;  Location: [facility];  Service: Gastroenterology;  Laterality: N/A;    Prior to Admission medications   Medication Sig Start Date End  Date Taking? Authorizing Provider  acetaminophen (TYLENOL) 325 MG tablet Take 325 mg by mouth every 6 (six) hours as needed for pain.   Yes Historical Provider, MD  aspirin EC 81 MG EC tablet Take 1 tablet (81 mg total) by mouth daily. 02/25/14  Yes Delfina Redwood, MD  diclofenac sodium (VOLTAREN) 1 % GEL Apply 2 g topically 4 (four) times daily. 07/25/14  Yes Olga Millers, MD  feeding supplement, RESOURCE BREEZE, (RESOURCE BREEZE) LIQD Take 1 Container by mouth 3 (three) times daily between meals. 03/31/14  Yes Olga Millers, MD  lisinopril (PRINIVIL,ZESTRIL) 40 MG tablet Take 1 tablet (40 mg total) by mouth daily. 03/31/14  Yes Olga Millers, MD  metoprolol tartrate (LOPRESSOR) 25 MG tablet Take 1 tablet (25 mg total) by mouth 2 (two) times daily. 03/31/14  Yes Olga Millers, MD  pantoprazole (PROTONIX) 40 MG tablet Take 1 tablet (40 mg total) by mouth daily. 03/31/14  Yes Olga Millers, MD  travoprost, benzalkonium, (TRAVATAN) 0.004 % ophthalmic solution Place 1 drop into both eyes at bedtime.   Yes  Historical Provider, MD  trolamine salicylate (ASPERCREME) 10 % cream Apply 1 application topically as needed for muscle pain (knees, back).   Yes Historical Provider, MD    Scheduled Meds: . metoprolol tartrate  25 mg Oral BID  . pantoprazole (PROTONIX) IV  40 mg Intravenous Q12H  . sodium chloride  3 mL Intravenous Q12H  . travoprost (benzalkonium)  1 drop Both Eyes QHS   Infusions: . sodium chloride 75 mL/hr at 08/04/14 0631   PRN Meds: acetaminophen, ondansetron **OR** ondansetron (ZOFRAN) IV, trolamine salicylate   Allergies as of 08/03/2014 - Review Complete 08/03/2014  Allergen Reaction Noted  . Codeine      Family History  Problem Relation Age of Onset  . Coronary artery disease Mother 46    MI    History   Social History  . Marital Status: Widowed    Spouse Name: N/A  . Number of Children: 7  . Years of Education: 8   Occupational History   .     Social History Main Topics  . Smoking status: Never Smoker   . Smokeless tobacco: Never Used  . Alcohol Use: No  . Drug Use: No  . Sexual Activity: No   Other Topics Concern  . Not on file   Social History Narrative   8th grade. Married '40 - 52 yrs/widowed. 5 sons, 2 daughters; 6 grandchildren; 12 great grands.   Lives alone and manages ADLs except driving. Family is very supportive. ACP -discussed with her and referred to "[employer]. Org." (Oct '13)    REVIEW OF SYSTEMS: Constitutional:  Weight down 11# since 03/2014.   ENT:  No nose bleeds Pulm:  No SOB or cough CV:  No palpitations, no LE edema.  GU:  No hematuria, no frequency GI:  Per HPI Heme:  Per HPI   Transfusions:  None per her recall Neuro:  No headaches, no peripheral tingling or numbness.  + for falls: loses balance and some dizziness.  Derm:  No itching, no rash or sores.  Endocrine:  No sweats or chills.  No polyuria or dysuria Immunization:  Not queried Travel:  None beyond local counties in last few months.    PHYSICAL EXAM: Vital signs in last 24 hours: Filed Vitals:   08/04/14 0920  BP: 155/81  Pulse: 85  Temp:   Resp: 21   Wt Readings from Last 3 Encounters:  07/25/14 151 lb 1.9 oz (68.548 kg)  04/26/14 160 lb 3.2 oz (72.666 kg)  03/31/14 162 lb 3.2 oz (73.573 kg)   General: pleasant, alert.   Head:  No facial swelling, no asymmetry  Eyes:  No icterus, no conj pallor Ears:  Slightly HOH, not severe  Nose:  No congestion no discharge Mouth:  No sores or ulcers Neck:  No mass or JVD Lungs:  Clear bil Heart: rate in 100s, regular. Abdomen:  soft.  [place], [place].  Active BS.  Not distended Rectal: large amount of mahogany, deeply red bloody stool in commode   Musc/Skeltl: slight kyphosis.  No contracture deformities.  Extremities:  No CCE.  Thin extremities.   Neurologic:  Oriented x 3, no tremor or limb weakness.   Skin:  No rash, no sores.  Tattoos:  none Nodes:  No  cervical adenopathy   Psych:  Pleasant. Relaxed, cooperative.   Intake/Output from previous day:   Intake/Output this shift:    LAB RESULTS:  Recent Labs  08/03/14 1705 08/03/14 1743 08/04/14 0638  WBC 8.4  --  7.7  HGB 11.0* 12.2 8.8*  HCT 33.9* 36.0 26.8*  PLT 340  --  273   BMET Lab Results  Component Value Date   NA 141 08/04/2014   NA 142 08/03/2014   NA 140 08/03/2014   K 3.7 08/04/2014   K 3.4* 08/03/2014   K 5.5* 08/03/2014   CL 108 08/04/2014   CL 109 08/03/2014   CL 106 08/03/2014   CO2 26 08/04/2014   CO2 25 08/03/2014   CO2 26 04/23/2014   GLUCOSE 108* 08/04/2014   GLUCOSE 119* 08/03/2014   GLUCOSE 130* 08/03/2014   BUN 19 08/04/2014   BUN 15 08/03/2014   BUN 27* 08/03/2014   CREATININE 0.80 08/04/2014   CREATININE 0.89 08/03/2014   CREATININE 0.90 08/03/2014   CALCIUM 8.3* 08/04/2014   CALCIUM 8.7 08/03/2014   CALCIUM 9.6 04/23/2014   LFT  Recent Labs  08/03/14 1748  PROT 6.1  ALBUMIN 3.4*  AST 17  ALT 8  ALKPHOS 71  BILITOT 0.6   PT/INR Lab Results  Component Value Date   INR 1.03 02/23/2014    Drugs of Abuse  No results found for: LABOPIA, COCAINSCRNUR, LABBENZ, AMPHETMU, THCU, LABBARB   RADIOLOGY STUDIES: Dg Chest Portable 1 View  08/03/2014   CLINICAL DATA:  Tachycardia.  History of breast cancer.  EXAM: PORTABLE CHEST - 1 VIEW  COMPARISON:  PA and lateral chest 04/23/2014.  FINDINGS: The lungs are clear. Heart size is upper normal. Ectatic aorta is noted. Marked degenerative change about the right shoulder is seen.  IMPRESSION: No acute disease.   Electronically Signed   By: Inge Rise M.D.   On: 08/03/2014 17:55    ENDOSCOPIC STUDIES: 01/2013  EGD  Dr Hilarie Fredrickson for Melena ENDOSCOPIC IMPRESSION: 1. The mucosa of the esophagus appeared normal 2. Gastritis (inflammation) was found at the incisura, in the gastric antrum, and prepyloric region of the stomach; multiple biopsies. 3. The duodenal mucosa showed no  abnormalities in the bulb and second portion of the duodenum RECOMMENDATIONS: 1. Await pathology results 2. Daily PPI for now 3. Consider colonoscopy to evaluate melena/heme + stool. Patient would like discuss this more with family before proceeding. Pathology 1. Stomach, biopsy - MODERATE CHRONIC ACTIVE ATROPHIC GASTRITIS WITH INTESTINAL METAPLASIA - WARTHIN STARRY STAIN NEGATIVE FOR H. PYLORI. 2. Stomach, biopsy, Incisura - MODERATE CHRONIC ACTIVE ATROPHIC GASTRITIS WITH INTESTINAL METAPLASIA. - NEGATIVE FOR DYSPLASIA OR MALIGNANCY.  Colonoscopy   Remotely, > 10 yrs ago, per Dr Secundino Ginger, unable to locate report.    IMPRESSION:   *  GI bleed.  With melena and elevated BUN.  Suspect recurrent UGI bleed. Med list includes PPI at home, though pt nor family can 100% confirm she is using this.  GI bleed 01/2013, EGD with gastritis.   *  ABL anemia.      PLAN:     *  EGD set up for this afternoon at 2 PM.  D/W pt and her family.  *  Follow  CBC, transfuse prn.   Azucena Freed  08/04/2014, 9:31 AM Pager: [phone]  GI Attending Note   Chart was reviewed and patient was examined. X-rays and lab were reviewed.    I agree with management and plans.  Probable upper GI bleed - suspect active peptic ulcer disease more likely than AVMs or neoplasm.  Patient has no lower GI complaints.  Elevated BUN chest a more proximal GI bleeding source.  Plan to proceed with upper endoscopy.  Sandy Salaam. Deatra Ina, M.D.,  [facility] Cell [phone]

## 2014-08-05 ENCOUNTER — Encounter (HOSPITAL_COMMUNITY): Payer: Self-pay | Admitting: Gastroenterology

## 2014-08-05 ENCOUNTER — Inpatient Hospital Stay (HOSPITAL_COMMUNITY): Payer: Medicare Other

## 2014-08-05 DIAGNOSIS — D62 Acute posthemorrhagic anemia: Secondary | ICD-10-CM

## 2014-08-05 DIAGNOSIS — I471 Supraventricular tachycardia: Secondary | ICD-10-CM

## 2014-08-05 LAB — PROTIME-INR
INR: 1.23 (ref 0.00–1.49)
Prothrombin Time: 15.6 seconds — ABNORMAL HIGH (ref 11.6–15.2)

## 2014-08-05 LAB — CBC
HCT: 24.7 % — ABNORMAL LOW (ref 36.0–46.0)
HEMATOCRIT: 21.6 % — AB (ref 36.0–46.0)
Hemoglobin: 7 g/dL — ABNORMAL LOW (ref 12.0–15.0)
Hemoglobin: 8.3 g/dL — ABNORMAL LOW (ref 12.0–15.0)
MCH: 27.8 pg (ref 26.0–34.0)
MCH: 28.8 pg (ref 26.0–34.0)
MCHC: 32.4 g/dL (ref 30.0–36.0)
MCHC: 33.6 g/dL (ref 30.0–36.0)
MCV: 85.7 fL (ref 78.0–100.0)
MCV: 85.8 fL (ref 78.0–100.0)
PLATELETS: 222 10*3/uL (ref 150–400)
Platelets: 228 10*3/uL (ref 150–400)
RBC: 2.52 MIL/uL — AB (ref 3.87–5.11)
RBC: 2.88 MIL/uL — ABNORMAL LOW (ref 3.87–5.11)
RDW: 15 % (ref 11.5–15.5)
RDW: 14.5 % (ref 11.5–15.5)
WBC: 14.6 10*3/uL — AB (ref 4.0–10.5)
WBC: 8.4 10*3/uL (ref 4.0–10.5)

## 2014-08-05 LAB — APTT: aPTT: 28 seconds (ref 24–37)

## 2014-08-05 LAB — PREPARE RBC (CROSSMATCH)

## 2014-08-05 MED ORDER — SODIUM CHLORIDE 0.9 % IV SOLN
Freq: Once | INTRAVENOUS | Status: AC
  Administered 2014-08-05: 08:00:00 via INTRAVENOUS

## 2014-08-05 MED ORDER — LORAZEPAM 2 MG/ML IJ SOLN: 0.5000 mg | Freq: Once | INTRAMUSCULAR | Status: DC

## 2014-08-05 MED ORDER — IOHEXOL 300 MG/ML  SOLN
80.0000 mL | Freq: Once | INTRAMUSCULAR | Status: AC | PRN
  Administered 2014-08-05: 80 mL via INTRAVENOUS

## 2014-08-05 MED ORDER — LORAZEPAM 2 MG/ML IJ SOLN
0.5000 mg | Freq: Once | INTRAMUSCULAR | Status: AC
  Administered 2014-08-05: 0.5 mg via INTRAVENOUS
  Filled 2014-08-05: qty 1

## 2014-08-05 MED ORDER — IOHEXOL 300 MG/ML  SOLN
25.0000 mL | INTRAMUSCULAR | Status: DC
  Administered 2014-08-05 (×2): 25 mL via ORAL

## 2014-08-05 MED ORDER — SODIUM CHLORIDE 0.9 % IV BOLUS (SEPSIS)
500.0000 mL | Freq: Once | INTRAVENOUS | Status: AC
  Administered 2014-08-05: 500 mL via INTRAVENOUS

## 2014-08-05 NOTE — Progress Notes (Signed)
          Daily Rounding Note  08/05/2014, 9:55 AM  LOS: 2 days   SUBJECTIVE:       Confused, agitated overnight.  Dark, bloody BM at 6 AM.  A little tender in left abdomen.  No nausea.   OBJECTIVE:         Vital signs in last 24 hours:    Temp:  [97.3 F (36.3 C)-98.5 F (36.9 C)] 97.3 F (36.3 C) (03/04 0812) Pulse Rate:  [63-91] 74 (03/04 0500) Resp:  [12-33] 19 (03/04 0830) BP: (106-192)/(49-113) 117/70 mmHg (03/04 0830) SpO2:  [97 %-100 %] 98 % (03/04 0812) Weight:  [148 lb (67.132 kg)] 148 lb (67.132 kg) (03/04 0500) Last BM Date: 08/04/14 Filed Weights   08/05/14 0500  Weight: 148 lb (67.132 kg)   General: pleasant, cooperative   Heart: RRR Chest: clear bil.  No dyspnea Abdomen: soft, slightly tender on left.  No guard or rebound.  Active BS  Extremities: no CCE Neuro/Psych:  Cooperative, slightly agitated.   Recognizes family and staff.   Intake/Output from previous day: 03/03 0701 - 03/04 0700 In: 740 [P.O.:440; I.V.:300] Out: 350 [Urine:350]  Intake/Output this shift:    Lab Results:  Recent Labs  08/04/14 1025 08/04/14 2300 08/05/14 0041  WBC 7.7 6.9 8.4  HGB 8.8* 8.0* 7.0*  HCT 27.2* 24.3* 21.6*  PLT 257 257 228   BMET  Recent Labs  08/03/14 1743 08/03/14 1748 08/04/14 0638  NA 140 142 141  K 5.5* 3.4* 3.7  CL 106 109 108  CO2  --  25 26  GLUCOSE 130* 119* 108*  BUN 27* 15 19  CREATININE 0.90 0.89 0.80  CALCIUM  --  8.7 8.3*   LFT  Recent Labs  08/03/14 1748  PROT 6.1  ALBUMIN 3.4*  AST 17  ALT 8  ALKPHOS 71  BILITOT 0.6    Studies/Results: Dg Chest Portable 1 View  08/03/2014   CLINICAL DATA:  Tachycardia.  History of breast cancer.  EXAM: PORTABLE CHEST - 1 VIEW  COMPARISON:  PA and lateral chest 04/23/2014.  FINDINGS: The lungs are clear. Heart size is upper normal. Ectatic aorta is noted. Marked degenerative change about the right shoulder is seen.  IMPRESSION: No acute  disease.   Electronically Signed   By: Inge Rise M.D.   On: 08/03/2014 17:55    ASSESMENT:   * GI bleed. With melena and elevated BUN. Suspect recurrent UGI bleed. Med list includes PPI at home, though pt nor family can 100% confirm she is using this.  GI bleed 01/2013, EGD with gastritis.  EGD 08/04/14: esophageal web, o/w normal study.   * ABL anemia. s/p one unit PRBC.     *  Azotemia, resolved.   *  Sundowning.     PLAN   *  CT scan abdomen to rule out mass lesion. Study impeded by loss of IV access.  Possible cologuard as outpt.     Azucena Freed  08/05/2014, 9:55 AM Pager: [phone]  GI Attending Note  I have personally taken an interval history, reviewed the chart, and examined the patient.  I agree with the extender's note, impression and recommendations. She could be bleeding from a diverticulum but would like to r/o other gross colonic abnormalities.  Await CT scan  Sandy Salaam. Deatra Ina, MD, [facility] [phone]

## 2014-08-05 NOTE — Progress Notes (Signed)
Pt is refusing new IV being placed. Orientation is off for year, place, birthday and situation. Is trying to call her mother

## 2014-08-05 NOTE — Progress Notes (Signed)
Pt is confused, went to bathroom and trailed large gouts of blood from rectum. Galled on call GI, talked to Dr Ardis Hughs. He ordered a stat CBC and to hold off going for CT of Abdomen until the morning. Pt is confused from earlier assessment baseline

## 2014-08-05 NOTE — Progress Notes (Signed)
Pt agitated and restless, SVTs coming more rapidly. Called on call, Ativan requested

## 2014-08-05 NOTE — Progress Notes (Signed)
[facility] TEAM 1 - Stepdown/ICU TEAM Progress Note  [name] NOB:[id number] DOB: [birth year] DOA: 08/03/2014 PCP: Olga Millers, MD  Admit HPI / Brief Narrative: 81yo F Hx PSVT, history of upper GI bleed due to gastritis, hypertension, and DJD who presented to the ED from her PCP's office with dark stools for 2 days.  She stated she had been feeling lightheaded and weak for a week.  She denied hematemesis, hematochezia, or BRBPR. She is on a daily aspirin 81 mg, but denied other NSAID or alcohol use. She had an EGD 2014 with evidence of gastritis, and is on daily PPI.    In ED EKG with evidence of SVT with HR in the 140s-150s which spontaneously corrected,  Hgb 12.2. GI was consulted, and pt admitted for further management.    HPI/Subjective: Has become severely confused and combative.  Is unable to provide a reliable hx.  Hgb has been dropping, and pt had a large dark bloody stool earlier today.  Assessment/Plan:  GI bleed EGD 2014 w/ evidence of gastritis, no previous colonscopy - EGD 3/3 was normal (except for esophageal web) - CT abdom/pelvis suggested to r/o gross colonic abnormality, will attempt to obtain when pt more calm/cooperative - to consider cologuard as outpt per GI recs   Acute blood loss anemia  Hgb continues to drop - transfusing 1U today - goal will be to keep Hgb 7.0 or > - follow in serial fashion   Acute delirium QTc prolonged, so must avoid haldol/seroquel - use ativan if absolutely necessary - utilize sitter for safety   Hyperkalemia resolved  Acute on chronic SVT Continue daily metoprolol -2-D echo w/o relevant findings   Hypertension BP currently well controlled - hold lisinopril in setting of GIB  DJD Tylenol when necessary  History of breast cancer  Code Status: DNR Family Communication: multiple family present at time of exam Disposition Plan: SDU  Consultants: GI  Procedure/Significant Events: 08/04/14 - EGD normal except for  esophageal web  Antibiotics: None  DVT prophylaxis: SCDs  Objective: Blood pressure 110/60, pulse 85, temperature 97.3 F (36.3 C), temperature source Axillary, resp. rate 19, height 5\' 8"  (1.727 m), weight 67.132 kg (148 lb), SpO2 98 %.  Intake/Output Summary (Last 24 hours) at 08/05/14 1043 Last data filed at 08/04/14 2100  Gross per 24 hour  Intake    740 ml  Output    350 ml  Net    390 ml   Exam: General: no acute resp distress - agitated and confused  Lungs: Clear to auscultation bilaterally without wheezes or crackles Cardiovascular: tachycardic but regular - no gallup, rub, or M appreciated  Abdomen: Nontender, nondistended, soft, bowel sounds positive, no rebound, no ascites, no appreciable mass Extremities: No significant cyanosis, clubbing, or edema bilateral lower extremities  Data Reviewed: Basic Metabolic Panel:  Recent Labs Lab 08/03/14 1743 08/03/14 1748 08/04/14 0638  NA 140 142 141  K 5.5* 3.4* 3.7  CL 106 109 108  CO2  --  25 26  GLUCOSE 130* 119* 108*  BUN 27* 15 19  CREATININE 0.90 0.89 0.80  CALCIUM  --  8.7 8.3*  MG  --  2.1  --    Liver Function Tests:  Recent Labs Lab 08/03/14 1748  AST 17  ALT 8  ALKPHOS 71  BILITOT 0.6  PROT 6.1  ALBUMIN 3.4*   CBC:  Recent Labs Lab 08/03/14 1705 08/03/14 1743 08/04/14 0638 08/04/14 1025 08/04/14 2300 08/05/14 0041  WBC 8.4  --  7.7 7.7 6.9 8.4  NEUTROABS 6.4  --   --   --   --   --   HGB 11.0* 12.2 8.8* 8.8* 8.0* 7.0*  HCT 33.9* 36.0 26.8* 27.2* 24.3* 21.6*  MCV 84.3  --  84.0 86.3 84.4 85.7  PLT 340  --  273 257 257 228   No results found for this or any previous visit (from the past 240 hour(s)).   Studies:  Recent x-ray studies have been reviewed in detail by the Attending Physician  Scheduled Meds:  Scheduled Meds: . iohexol  25 mL Oral Q1 Hr x 2  . latanoprost  1 drop Both Eyes QHS  . metoprolol tartrate  37.5 mg Oral BID  . pantoprazole  40 mg Oral Q0600  . sodium  chloride  3 mL Intravenous Q12H    Time spent on care of this patient: 35 mins  Cherene Altes, MD [facility] For Consults/Admissions - Flow Manager - [phone] Office  [phone]  Contact MD directly via text page:      [email or web address]      password [facility]  08/05/2014, 10:43 AM   LOS: 2 days

## 2014-08-05 NOTE — Progress Notes (Signed)
Pt has been adamantly refusing all attempts to replace pulled out IV and draw any peripheral labs. Family came in to talk to her, finally convinced her to let us attempt. Two RNs attempted to hit vein above wrist for CT of Abdomen, but veins rolled dramatically. IV team consulted to attempt

## 2014-08-06 LAB — COMPREHENSIVE METABOLIC PANEL
ALBUMIN: 2.6 g/dL — AB (ref 3.5–5.2)
ALK PHOS: 54 U/L (ref 39–117)
ALT: 7 U/L (ref 0–35)
ANION GAP: 5 (ref 5–15)
AST: 14 U/L (ref 0–37)
BILIRUBIN TOTAL: 0.9 mg/dL (ref 0.3–1.2)
BUN: 12 mg/dL (ref 6–23)
CO2: 23 mmol/L (ref 19–32)
Calcium: 8.1 mg/dL — ABNORMAL LOW (ref 8.4–10.5)
Chloride: 114 mmol/L — ABNORMAL HIGH (ref 96–112)
Creatinine, Ser: 0.76 mg/dL (ref 0.50–1.10)
GFR calc non Af Amer: 72 mL/min — ABNORMAL LOW (ref >90)
GFR, EST AFRICAN AMERICAN: 84 mL/min — AB (ref >90)
Glucose, Bld: 90 mg/dL (ref 70–99)
Potassium: 3.1 mmol/L — ABNORMAL LOW (ref 3.5–5.1)
Sodium: 142 mmol/L (ref 135–145)
TOTAL PROTEIN: 4.8 g/dL — AB (ref 6.0–8.3)

## 2014-08-06 LAB — TYPE AND SCREEN
ABO/RH(D): B POS
Antibody Screen: NEGATIVE
UNIT DIVISION: 0

## 2014-08-06 LAB — CBC
HEMATOCRIT: 22.1 % — AB (ref 36.0–46.0)
HEMATOCRIT: 21 % — AB (ref 36.0–46.0)
Hemoglobin: 7.6 g/dL — ABNORMAL LOW (ref 12.0–15.0)
Hemoglobin: 7.1 g/dL — ABNORMAL LOW (ref 12.0–15.0)
MCH: 29.1 pg (ref 26.0–34.0)
MCH: 29 pg (ref 26.0–34.0)
MCHC: 34.4 g/dL (ref 30.0–36.0)
MCHC: 33.8 g/dL (ref 30.0–36.0)
MCV: 84.7 fL (ref 78.0–100.0)
MCV: 85.7 fL (ref 78.0–100.0)
PLATELETS: 210 10*3/uL (ref 150–400)
PLATELETS: 235 10*3/uL (ref 150–400)
RBC: 2.61 MIL/uL — AB (ref 3.87–5.11)
RBC: 2.45 MIL/uL — ABNORMAL LOW (ref 3.87–5.11)
RDW: 14.8 % (ref 11.5–15.5)
RDW: 15 % (ref 11.5–15.5)
WBC: 9.1 10*3/uL (ref 4.0–10.5)
WBC: 9.4 10*3/uL (ref 4.0–10.5)

## 2014-08-06 MED ORDER — POTASSIUM CHLORIDE CRYS ER 20 MEQ PO TBCR
40.0000 meq | EXTENDED_RELEASE_TABLET | Freq: Once | ORAL | Status: AC
  Administered 2014-08-06: 40 meq via ORAL
  Filled 2014-08-06: qty 2

## 2014-08-06 NOTE — Plan of Care (Signed)
Problem: Phase I Progression Outcomes Goal: OOB as tolerated unless otherwise ordered Outcome: Completed/Met Date Met:  08/06/14 Pt tol OOB in chair, using BSC

## 2014-08-06 NOTE — Progress Notes (Signed)
Progress Note   Subjective  *No bleeding since yesterday   Objective  Vital signs in last 24 hours: Temp:  [98.2 F (36.8 C)-98.6 F (37 C)] 98.2 F (36.8 C) (03/05 0520) Pulse Rate:  [72-74] 74 (03/04 2248) Resp:  [13-26] 20 (03/05 1002) BP: (106-149)/(37-68) 128/68 mmHg (03/05 1002) SpO2:  [100 %] 100 % (03/04 2000) Weight:  [157 lb 1.6 oz (71.26 kg)] 157 lb 1.6 oz (71.26 kg) (03/05 0520) Last BM Date: 08/05/14 General:   Alert,  Well-developed,   in NAD Heart:  Regular rate and rhythm; no murmurs Abdomen:  Soft, nontender and nondistended. Normal bowel sounds, without guarding, and without rebound.   Extremities:  Without edema. Neurologic:  Alert and  oriented x4;  grossly normal neurologically. Psych:  Alert and cooperative. Normal mood and affect.  Intake/Output from previous day: 03/04 0701 - 03/05 0700 In: 1088 [P.O.:750; I.V.:3; Blood:335] Out: -  Intake/Output this shift: Total I/O In: 120 [P.O.:120] Out: 800 [Urine:800]  Lab Results:  Recent Labs  08/05/14 0041 08/05/14 1558 08/06/14 0220  WBC 8.4 14.6* 9.1  HGB 7.0* 8.3* 7.6*  HCT 21.6* 24.7* 22.1*  PLT 228 222 210   BMET  Recent Labs  08/03/14 1748 08/04/14 0638 08/06/14 0220  NA 142 141 142  K 3.4* 3.7 3.1*  CL 109 108 114*  CO2 25 26 23   GLUCOSE 119* 108* 90  BUN 15 19 12   CREATININE 0.89 0.80 0.76  CALCIUM 8.7 8.3* 8.1*   LFT  Recent Labs  08/06/14 0220  PROT 4.8*  ALBUMIN 2.6*  AST 14  ALT 7  ALKPHOS 54  BILITOT 0.9   PT/INR  Recent Labs  08/05/14 1558  LABPROT 15.6*  INR 1.23   Hepatitis Panel No results for input(s): HEPBSAG, HCVAB, HEPAIGM, HEPBIGM in the last 72 hours.  Studies/Results: Ct Abdomen Pelvis W Contrast  08/05/2014   CLINICAL DATA:  GI hemorrhage, evaluate for possible colonic lesions  EXAM: CT ABDOMEN AND PELVIS WITH CONTRAST  TECHNIQUE: Multidetector CT imaging of the abdomen and pelvis was performed using the standard protocol following  bolus administration of intravenous contrast.  CONTRAST:  21mL OMNIPAQUE IOHEXOL 300 MG/ML  SOLN  COMPARISON:  03/25/2011  FINDINGS: The lung bases are free of acute infiltrate or sizable effusion.  The gallbladder is been surgically removed. The liver, spleen, right adrenal gland and pancreas are within normal limits. A large left adrenal myelolipoma is again seen and stable.  The kidneys are well visualized without renal calculi or urinary tract obstructive changes. Normal excretion of contrast material is noted.  Significant diverticular change of the colon is noted. This is most marked in the sigmoid colon with significant smooth muscle hypertrophy. The lesion in this area will be difficult to exclude although the diverticular change may be the etiology of the patient's underlying GI hemorrhage.  Diffuse aortoiliac calcifications are noted without aortic aneurysm. Ectasia of the common iliac arteries is noted bilaterally. A aneurysm is noted of the internal iliac artery bilaterally. This measures 3.1 cm on the left and approximately 2.2 cm on the right. Considerable mural thrombus is noted within both aneurysms. They appear only mildly enlarged. When compared with the prior exam from 2012. The bladder is well distended. No pelvic mass lesion or sidewall abnormality is noted. The uterus has been surgically removed. Significant degenerative change of the lumbar spine is noted.  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 These are only minimally enlarged when  compared with the prior exam from 2012.  Significant diverticular change particularly within the sigmoid colon as described. This may be the etiology of the patient's GI hemorrhage. No definitive colonic mass is seen.  Stable left adrenal myelolipoma.   Electronically Signed   By: Inez Catalina M.D.   On: 08/05/2014 14:02      Assessment & Plan  *GIB - probably diverticular.  No further signs of overt bleeding.   Hg stable.  May advance diet.  If stable may d/c in am.  Would obtain a cologuard as outpt to help r/o advanced neoplasm. ** Active Problems:   Essential hypertension   GERD   DJD (degenerative joint disease)   SVT (supraventricular tachycardia)   GI bleed   Melena   Bleeding gastrointestinal   Acute blood loss anemia     LOS: 3 days   Erskine Emery  08/06/2014, 11:29 AM

## 2014-08-06 NOTE — Progress Notes (Signed)
[facility] TEAM 1 - Stepdown/ICU TEAM Progress Note  [name]:[id number] DOB: [birth year] DOA: 08/03/2014 PCP: Olga Millers, MD  Admit HPI / Brief Narrative: 81yo F Hx PSVT, history of upper GI bleed due to gastritis, hypertension, and DJD who presented to the ED from her PCP's office with dark stools for 2 days.  She stated she had been feeling lightheaded and weak for a week.  She denied hematemesis, hematochezia, or BRBPR. She is on a daily aspirin 81 mg, but denied other NSAID or alcohol use. She had an EGD 2014 with evidence of gastritis, and is on daily PPI.    In ED EKG with evidence of SVT with HR in the 140s-150s which spontaneously corrected,  Hgb 12.2. GI was consulted, and pt admitted for further management.    HPI/Subjective: Mental status much improved today.  No new complaints, but does desire a regular diet. Has not had a bowel movement thus far today.  Denies dizziness or lethargy.     Assessment/Plan:  GI bleed - suspected diverticular  EGD 2014 w/ evidence of gastritis, no previous colonscopy - EGD 3/3 was normal (except for esophageal web) - CT abdom/pelvis notes "significant diverticular change" with no definitive colon mass - to consider cologuard as outpt per GI recs - cont to monitor   Acute blood loss anemia  Hgb continues to vacilate - transfused 1U 08/05/14 - goal will be to keep Hgb 7.0 or > - follow in serial fashion   Acute delirium QTc prolonged, so must avoid haldol/seroquel - use ativan if absolutely necessary - utilize sitter for safety   Hyperkalemia > Hypokalemia  K now low - gently replace and follow trend   Acute on chronic SVT Continue daily metoprolol - 2-D echo w/o relevant findings   Bilateral internal iliac artery aneurysms  Noted via CT abdom/pelvis - minimally enlarged since 2012 - signif mural thrombi noted, but clearly anticoag is not an option, nor is an intervention   Hypertension BP currently well controlled - hold  lisinopril in setting of GIB  DJD Tylenol when necessary  History of breast cancer  Code Status: DNR Family Communication: spoke w/ son daughter in law at bedside  Disposition Plan: transfer to tele bed   Consultants: GI  Procedure/Significant Events: 08/04/14 - EGD normal except for esophageal web  Antibiotics: None  DVT prophylaxis: SCDs  Objective: Blood pressure 120/51, pulse 74, temperature 98.2 F (36.8 C), temperature source Oral, resp. rate 22, height 5\' 8"  (1.727 m), weight 71.26 kg (157 lb 1.6 oz), SpO2 100 %.  Intake/Output Summary (Last 24 hours) at 08/06/14 0836 Last data filed at 08/06/14 0800  Gross per 24 hour  Intake   1208 ml  Output    400 ml  Net    808 ml   Exam: General: no acute resp distress - fully alert and oriented today - pleasant and conversant  Lungs: Clear to auscultation bilaterally without wheezes or crackles Cardiovascular: RRR - no gallup, rub, or M appreciated  Abdomen: Nontender, nondistended, soft, bowel sounds positive, no rebound, no ascites, no appreciable mass Extremities: No significant cyanosis, clubbing, or edema bilateral lower extremities  Data Reviewed: Basic Metabolic Panel:  Recent Labs Lab 08/03/14 1743 08/03/14 1748 08/04/14 0638 08/06/14 0220  NA 140 142 141 142  K 5.5* 3.4* 3.7 3.1*  CL 106 109 108 114*  CO2  --  25 26 23   GLUCOSE 130* 119* 108* 90  BUN 27* 15 19 12  CREATININE 0.90 0.89 0.80 0.76  CALCIUM  --  8.7 8.3* 8.1*  MG  --  2.1  --   --    Liver Function Tests:  Recent Labs Lab 08/03/14 1748 08/06/14 0220  AST 17 14  ALT 8 7  ALKPHOS 71 54  BILITOT 0.6 0.9  PROT 6.1 4.8*  ALBUMIN 3.4* 2.6*   CBC:  Recent Labs Lab 08/03/14 1705  08/04/14 1025 08/04/14 2300 08/05/14 0041 08/05/14 1558 08/06/14 0220  WBC 8.4  < > 7.7 6.9 8.4 14.6* 9.1  NEUTROABS 6.4  --   --   --   --   --   --   HGB 11.0*  < > 8.8* 8.0* 7.0* 8.3* 7.6*  HCT 33.9*  < > 27.2* 24.3* 21.6* 24.7* 22.1*  MCV  84.3  < > 86.3 84.4 85.7 85.8 84.7  PLT 340  < > 257 257 228 222 210  < > = values in this interval not displayed. No results found for this or any previous visit (from the past 240 hour(s)).   Studies:  Recent x-ray studies have been reviewed in detail by the Attending Physician  Scheduled Meds:  Scheduled Meds: . latanoprost  1 drop Both Eyes QHS  . metoprolol tartrate  37.5 mg Oral BID  . pantoprazole  40 mg Oral Q0600  . sodium chloride  3 mL Intravenous Q12H    Time spent on care of this patient: 35 mins  Cherene Altes, MD [facility] For Consults/Admissions - Flow Manager - [phone] Office  [phone]  Contact MD directly via text page:      [email or web address]      password [facility]  08/06/2014, 8:36 AM   LOS: 3 days

## 2014-08-07 DIAGNOSIS — K5791 Diverticulosis of intestine, part unspecified, without perforation or abscess with bleeding: Secondary | ICD-10-CM

## 2014-08-07 LAB — CBC
HCT: 19.1 % — ABNORMAL LOW (ref 36.0–46.0)
HCT: 21.4 % — ABNORMAL LOW (ref 36.0–46.0)
Hemoglobin: 6.1 g/dL — CL (ref 12.0–15.0)
Hemoglobin: 7.2 g/dL — ABNORMAL LOW (ref 12.0–15.0)
MCH: 27.9 pg (ref 26.0–34.0)
MCH: 28.9 pg (ref 26.0–34.0)
MCHC: 31.9 g/dL (ref 30.0–36.0)
MCHC: 33.6 g/dL (ref 30.0–36.0)
MCV: 87.2 fL (ref 78.0–100.0)
MCV: 85.9 fL (ref 78.0–100.0)
PLATELETS: 200 10*3/uL (ref 150–400)
Platelets: 208 10*3/uL (ref 150–400)
RBC: 2.19 MIL/uL — AB (ref 3.87–5.11)
RBC: 2.49 MIL/uL — ABNORMAL LOW (ref 3.87–5.11)
RDW: 15.5 % (ref 11.5–15.5)
RDW: 15.5 % (ref 11.5–15.5)
WBC: 9 10*3/uL (ref 4.0–10.5)
WBC: 9.8 10*3/uL (ref 4.0–10.5)

## 2014-08-07 LAB — BASIC METABOLIC PANEL
ANION GAP: 6 (ref 5–15)
BUN: 15 mg/dL (ref 6–23)
CALCIUM: 8 mg/dL — AB (ref 8.4–10.5)
CHLORIDE: 115 mmol/L — AB (ref 96–112)
CO2: 22 mmol/L (ref 19–32)
CREATININE: 0.8 mg/dL (ref 0.50–1.10)
GFR, EST AFRICAN AMERICAN: 73 mL/min — AB (ref >90)
GFR, EST NON AFRICAN AMERICAN: 63 mL/min — AB (ref >90)
GLUCOSE: 96 mg/dL (ref 70–99)
POTASSIUM: 3.6 mmol/L (ref 3.5–5.1)
SODIUM: 143 mmol/L (ref 135–145)

## 2014-08-07 LAB — PREPARE RBC (CROSSMATCH)

## 2014-08-07 LAB — MAGNESIUM: MAGNESIUM: 1.9 mg/dL (ref 1.5–2.5)

## 2014-08-07 MED ORDER — SODIUM CHLORIDE 0.9 % IV SOLN: Freq: Once | INTRAVENOUS | Status: DC

## 2014-08-07 NOTE — Progress Notes (Addendum)
[facility] TEAM 1 - Stepdown/ICU TEAM Progress Note  JANIJAH SYMONS IWL:[id number] DOB: 28-Apr-1922 DOA: 08/03/2014 PCP: Olga Millers, MD  Admit HPI / Brief Narrative: 81yo F Hx PSVT, history of upper GI bleed due to gastritis, hypertension, and DJD who presented to the ED from her PCP's office with dark stools for 2 days.  She stated she had been feeling lightheaded and weak for a week.  She denied hematemesis, hematochezia, or BRBPR. She is on a daily aspirin 81 mg, but denied other NSAID or alcohol use. She had an EGD 2014 with evidence of gastritis, and is on daily PPI.    In ED EKG with evidence of SVT with HR in the 140s-150s which spontaneously corrected,  Hgb 12.2. GI was consulted, and pt admitted for further management.    HPI/Subjective: Spoke w/ son at bedside, and with a daugher via telephone.  There appears to be some disagreement amongst the family who the POA is.  The pt is alert and pleasant today, and enjoying her meals w/o new complaints.  No further bloody or melanotic stools have been noted.    Assessment/Plan:  GI bleed - suspected diverticular  EGD 2014 w/ evidence of gastritis, no previous colonscopy - EGD 3/3 was normal (except for esophageal web) - CT abdom/pelvis notes "significant diverticular change" with no definitive colon mass - to consider cologuard as outpt per GI recs - cont to monitor   Acute blood loss anemia  Hgb continues to vacilate - transfused 1U 08/05/14 - goal will be to keep Hgb 7.0 or > - follow in serial fashion - required an additonal unit of blood early this morning - family/pt educated on strategy for supportive care  Acute delirium QTc prolonged, so must avoid haldol/seroquel - use ativan if absolutely necessary - utilize sitter for safety if needed - mental status improving overall, though remains confused when pushed in great detail    Hyperkalemia > Hypokalemia  K currently normal - follow   Acute on chronic SVT Continue daily  metoprolol - 2-D echo w/o relevant findings   Bilateral internal iliac artery aneurysms  Noted via CT abdom/pelvis - minimally enlarged since 2012 - signif mural thrombi noted, but clearly anticoag is not an option, nor is an intervention   Hypertension BP currently well controlled - hold lisinopril in setting of GIB  DJD Tylenol when necessary  History of breast cancer  Code Status: DNR Family Communication: spoke w/ son and daughter in law at bedside  Disposition Plan:  tele bed   Consultants: GI  Procedure/Significant Events: 08/04/14 - EGD normal except for esophageal web  Antibiotics: None  DVT prophylaxis: SCDs  Objective: Blood pressure 102/64, pulse 67, temperature 98.5 F (36.9 C), temperature source Oral, resp. rate 20, height 5\' 8"  (1.727 m), weight 71.26 kg (157 lb 1.6 oz), SpO2 100 %.  Intake/Output Summary (Last 24 hours) at 08/07/14 1720 Last data filed at 08/07/14 1153  Gross per 24 hour  Intake   1245 ml  Output    380 ml  Net    865 ml   Exam: General: no acute resp distress - fully alert today - pleasant and conversant  Lungs: Clear to auscultation bilaterally without wheezes or crackles Cardiovascular: RRR - no gallup, rub, or M appreciated  Abdomen: Nontender, nondistended, soft, bowel sounds positive, no rebound, no ascites, no appreciable mass Extremities: No significant cyanosis, clubbing, or edema bilateral lower extremities  Data Reviewed: Basic Metabolic Panel:  Recent Labs Lab  08/03/14 1743 08/03/14 1748 08/04/14 0638 08/06/14 0220 08/07/14 0408  NA 140 142 141 142 143  K 5.5* 3.4* 3.7 3.1* 3.6  CL 106 109 108 114* 115*  CO2  --  25 26 23 22   GLUCOSE 130* 119* 108* 90 96  BUN 27* 15 19 12 15   CREATININE 0.90 0.89 0.80 0.76 0.80  CALCIUM  --  8.7 8.3* 8.1* 8.0*  MG  --  2.1  --   --  1.9   Liver Function Tests:  Recent Labs Lab 08/03/14 1748 08/06/14 0220  AST 17 14  ALT 8 7  ALKPHOS 71 54  BILITOT 0.6 0.9  PROT 6.1  4.8*  ALBUMIN 3.4* 2.6*   CBC:  Recent Labs Lab 08/03/14 1705  08/05/14 1558 08/06/14 0220 08/06/14 1655 08/07/14 0408 08/07/14 1457  WBC 8.4  < > 14.6* 9.1 9.4 9.0 9.8  NEUTROABS 6.4  --   --   --   --   --   --   HGB 11.0*  < > 8.3* 7.6* 7.1* 6.1* 7.2*  HCT 33.9*  < > 24.7* 22.1* 21.0* 19.1* 21.4*  MCV 84.3  < > 85.8 84.7 85.7 87.2 85.9  PLT 340  < > 222 210 235 200 208  < > = values in this interval not displayed. No results found for this or any previous visit (from the past 240 hour(s)).   Studies:  Recent x-ray studies have been reviewed in detail by the Attending Physician  Scheduled Meds:  Scheduled Meds: . sodium chloride   Intravenous Once  . latanoprost  1 drop Both Eyes QHS  . metoprolol tartrate  37.5 mg Oral BID  . pantoprazole  40 mg Oral Q0600  . sodium chloride  3 mL Intravenous Q12H    Time spent on care of this patient: 35 mins  Cherene Altes, MD [facility] For Consults/Admissions - Flow Manager - [phone] Office  [phone]  Contact MD directly via text page:      [email or web address]      password [facility]  08/07/2014, 5:20 PM   LOS: 4 days

## 2014-08-07 NOTE — Progress Notes (Signed)
POA Darinda [place] was called to obtain consent to Blood Administration, no answer, message left to return call to the Unit.

## 2014-08-07 NOTE — Progress Notes (Signed)
CRITICAL VALUE ALERT  Critical value received:  HGB 6.1  Date of notification:  08/07/2014  Time of notification:  8299  Critical value read back: yes  Nurse who received alert:  Shelba Flake, RN  MD notified (1st page):  Kathline Magic, NP  Time of first page:  0500  MD notified (2nd page):  Time of second page:  Responding MD:  Kathline Magic, NP  Time MD responded:  now

## 2014-08-07 NOTE — Progress Notes (Signed)
Pt. Received 1 unit PRBC, no reaction, no distress observed.  Markeia Harkless A RN

## 2014-08-08 LAB — CBC
HEMATOCRIT: 21.2 % — AB (ref 36.0–46.0)
Hemoglobin: 7.1 g/dL — ABNORMAL LOW (ref 12.0–15.0)
MCH: 28.4 pg (ref 26.0–34.0)
MCHC: 33.5 g/dL (ref 30.0–36.0)
MCV: 84.8 fL (ref 78.0–100.0)
PLATELETS: 206 10*3/uL (ref 150–400)
RBC: 2.5 MIL/uL — ABNORMAL LOW (ref 3.87–5.11)
RDW: 16.5 % — ABNORMAL HIGH (ref 11.5–15.5)
WBC: 9.4 10*3/uL (ref 4.0–10.5)

## 2014-08-08 LAB — PREPARE RBC (CROSSMATCH)

## 2014-08-08 MED ORDER — SODIUM CHLORIDE 0.9 % IV SOLN: Freq: Once | INTRAVENOUS | Status: DC

## 2014-08-08 NOTE — Evaluation (Signed)
Occupational Therapy Evaluation [name] MRN: [id number] DOB: 1922/02/12 Today's Date: 08/08/2014    History of Present Illness Pt presents with blood in stool. past medical history of PSVT, history of upper GI bleed, hypertension, DJD    Clinical Impression   This 81 yo female admitted with above presents to acute OT with decreased balance and decreased mobility thus affecting her independence with BADLs as she was pta. She will benefit from acute OT with follow up OT on CIR to get back to a Mod I level to return home alone.    Follow Up Recommendations  CIR    Equipment Recommendations   (TBD)       Precautions / Restrictions Precautions Precautions: Fall Precaution Comments: reports she fell two days before this admission hittng her head on the couch ("so it didn't hurt much) Restrictions Weight Bearing Restrictions: No      Mobility Bed Mobility               General bed mobility comments: Pt sitting up in recliner chair upon PT arrival.   Transfers Overall transfer level: Needs assistance Equipment used: Straight cane Transfers: Sit to/from Stand Sit to Stand:  (Mod A from recliner, min A from 3n1 (higher))            Balance Overall balance assessment: History of Falls                                          ADL Overall ADL's : Needs assistance/impaired Eating/Feeding: Independent;Sitting   Grooming: Wash/dry hands;Wash/dry face;Supervision/safety;Set up (with min A standing at sink)   Upper Body Bathing: Supervision/ safety;Set up (with min A standing at sink)   Lower Body Bathing: Supervison/ safety;Set up (with min A standing at sink)   Upper Body Dressing : Set up;Supervision/safety;Sitting   Lower Body Dressing: Supervision/safety;Set up (with Mod A sit<>stand from recliner)   Toilet Transfer: Ambulation;Minimal assistance;RW;BSC (over toilet)   Toileting- Clothing Manipulation and Hygiene:  Minimal assistance;Sit to/from stand               Vision Additional Comments: wears bifocals, has glaucoma, has ha cataract surgery both eyes          Pertinent Vitals/Pain Pain Assessment: No/denies pain     Hand Dominance Right   Extremity/Trunk Assessment Upper Extremity Assessment Upper Extremity Assessment: Overall WFL for tasks assessed     Communication Communication Communication: No difficulties   Cognition Arousal/Alertness: Awake/alert Behavior During Therapy: WFL for tasks assessed/performed Overall Cognitive Status:  (pt could not figure out how to get the lotion to come out of the bottle)                                Home Living Family/patient expects to be discharged to:: Private residence Living Arrangements: Alone Available Help at Discharge: Family;Available PRN/intermittently Type of Home: House Home Access: Ramped entrance Entrance Stairs-Number of Steps: 2 steps up in the garage   Home Layout: One level     Bathroom Shower/Tub:  (usually sponge baths, if gets in tub family is always there to A per pt report)   Bathroom Toilet: Standard     Home Equipment: Cane - single point          Prior Functioning/Environment Level of Independence: Independent with assistive  device(s)        Comments: sometimes uses cane    OT Diagnosis: Generalized weakness   OT Problem List: Decreased strength;Impaired balance (sitting and/or standing)   OT Treatment/Interventions: Self-care/ADL training;Patient/family education;Balance training;DME and/or AE instruction    OT Goals(Current goals can be found in the care plan section) Acute Rehab OT Goals Patient Stated Goal: to go home OT Goal Formulation: With patient Time For Goal Achievement: 08/15/14 Potential to Achieve Goals: Good  OT Frequency: Min 2X/week   Barriers to D/C: Decreased caregiver support             End of Session Equipment Utilized During Treatment: Gait  belt;Rolling walker  Activity Tolerance: Patient tolerated treatment well Patient left: in chair;with call bell/phone within reach;with chair alarm set   Time: 2979-8921 OT Time Calculation (min): 31 min Charges:  OT General Charges $OT Visit: 1 Procedure OT Evaluation $Initial OT Evaluation Tier I: 1 Procedure OT Treatments $Self Care/Home Management : 8-22 mins  [name] [phone] 08/08/2014, 3:55 PM

## 2014-08-08 NOTE — Progress Notes (Signed)
CARE MANAGEMENT NOTE 08/08/2014  Patient:  [name], [name]   Account Number:  [id number]  Date Initiated:  08/04/2014  Documentation initiated by:  GRAVES-BIGELOW,Kenlee Vogt  Subjective/Objective Assessment:   Pt admitted for Melena/Upper GI bleed- SVT.     Action/Plan:   CM will monitor for disposition needs.   Anticipated DC Date:  08/06/2014   Anticipated DC Plan:        DC Planning Services  CM consult      Choice offered to / List presented to:             Status of service:  In process, will continue to follow Medicare Important Message given?  YES (If response is "NO", the following Medicare IM given date fields will be blank) Date Medicare IM given:  08/05/2014 Medicare IM given by:  GRAVES-BIGELOW,Timonthy Hovater Date Additional Medicare IM given:  08/08/2014 Additional Medicare IM given by:  Aleni Andrus GRAVES-BIGELOW  Discharge Disposition:    Per UR Regulation:  Reviewed for med. necessity/level of care/duration of stay  If discussed at [place] of Stay Meetings, dates discussed:   08/09/2014    Comments:  08-08-14 Jacqlyn Krauss, RN,BSN [phone] CIR consulting to see if pt is appropriate with SNF as an option as well. CM will continue to monitor.    08-05-14 [address] Jacqlyn Krauss, [place] [phone] CM was able to speak with family in regards to home. Pt lives alone and family checks in with her and takes to MD appointments. Pt has RW at home. CM will continue to monitor for disposition needs.

## 2014-08-08 NOTE — Progress Notes (Signed)
[facility] TEAM 1 - Stepdown/ICU TEAM Progress Note  [name] EHU:[id number] DOB: [birth year] DOA: 08/03/2014 PCP: Olga Millers, MD  Admit HPI / Brief Narrative: 81yo F Hx PSVT, history of upper GI bleed due to gastritis, hypertension, and DJD who presented to the ED from her PCP's office with dark stools for 2 days.  She stated she had been feeling lightheaded and weak for a week.  She denied hematemesis, hematochezia, or BRBPR. She is on a daily aspirin 81 mg, but denied other NSAID or alcohol use. She had an EGD 2014 with evidence of gastritis, and is on daily PPI.    In ED EKG with evidence of SVT with HR in the 140s-150s which spontaneously corrected,  Hgb 12.2. GI was consulted, and pt admitted for further management.    The pt has undergone an EGD, but this failed to note a source for her GIB/acute anemia.  Colonoscopy was not felt to be appropriate given her advanced age. A CT of the abdom was ordered instead, to r/o a gross colonic abnormality, and revealed only diffuse diverticulosis.   Since that time the pt has been receiving conservative care in hopes that her bleeding would spontaneously resolve.    HPI/Subjective: No new complaints today.  Hgb stabe, but very near transfusion threshold.  Denies cp, sob, n/v, or bowel movment today.  Is alert and has a good appetite.    Assessment/Plan:  GI bleed - suspected high diverticular  EGD 2014 w/ evidence of gastritis, no previous colonscopy - EGD 3/3 was normal (except for esophageal web) - CT abdom/pelvis notes "significant diverticular change" with no definitive colon mass - to consider cologuard as outpt per GI recs - cont to monitor   Acute blood loss anemia  Hgb continues to vacilate - transfused 1U 08/05/14 - goal will be to keep Hgb 7.0 or > - follow in serial fashion - required an additonal unit of blood 3/6 - family/pt educated on strategy for supportive care - will give one additional unit electively today as an  "insurance policy" and cont to follow Hgb trend   Acute delirium QTc prolonged, so must avoid haldol/seroquel - use ativan if absolutely necessary - utilize sitter for safety if needed - mental status improving consistently   Hyperkalemia > Hypokalemia  K currently normal - follow   Acute on chronic SVT Continue daily metoprolol - 2-D echo w/o relevant findings - will stop tele now   Bilateral internal iliac artery aneurysms  Noted via CT abdom/pelvis - minimally enlarged since 2012 - signif mural thrombi noted, but clearly anticoag is not an option, nor is an intervention   Hypertension BP currently well controlled - hold lisinopril in setting of GIB  DJD Tylenol when necessary  History of breast cancer  Code Status: DNR Family Communication: spoke w/ family member at bedside  Disposition Plan:  Med bed - transfuse one unit PRBC - PT/OT evals - possible need for CIR? - dispo when Hgb stable and venue determined   Consultants: GI  Procedure/Significant Events: 08/04/14 - EGD normal except for esophageal web  Antibiotics: None  DVT prophylaxis: SCDs  Objective: Blood pressure 138/62, pulse 62, temperature 97.8 F (36.6 C), temperature source Oral, resp. rate 18, height 5\' 8"  (1.727 m), weight 70.761 kg (156 lb), SpO2 100 %.  Intake/Output Summary (Last 24 hours) at 08/08/14 1200 Last data filed at 08/08/14 0807  Gross per 24 hour  Intake      0 ml  Output    600 ml  Net   -600 ml   Exam: General: no acute resp distress - fully alert Lungs: Clear to auscultation bilaterally without wheezes or crackles Cardiovascular: RRR - no gallup, rub, or M appreciated  Abdomen: Nontender, nondistended, soft, bowel sounds positive, no rebound, no ascites, no appreciable mass Extremities: No significant cyanosis, clubbing, or edema bilateral lower extremities  Data Reviewed: Basic Metabolic Panel:  Recent Labs Lab 08/03/14 1743 08/03/14 1748 08/04/14 0638 08/06/14 0220  08/07/14 0408  NA 140 142 141 142 143  K 5.5* 3.4* 3.7 3.1* 3.6  CL 106 109 108 114* 115*  CO2  --  25 26 23 22   GLUCOSE 130* 119* 108* 90 96  BUN 27* 15 19 12 15   CREATININE 0.90 0.89 0.80 0.76 0.80  CALCIUM  --  8.7 8.3* 8.1* 8.0*  MG  --  2.1  --   --  1.9   Liver Function Tests:  Recent Labs Lab 08/03/14 1748 08/06/14 0220  AST 17 14  ALT 8 7  ALKPHOS 71 54  BILITOT 0.6 0.9  PROT 6.1 4.8*  ALBUMIN 3.4* 2.6*   CBC:  Recent Labs Lab 08/03/14 1705  08/06/14 0220 08/06/14 1655 08/07/14 0408 08/07/14 1457 08/08/14 0514  WBC 8.4  < > 9.1 9.4 9.0 9.8 9.4  NEUTROABS 6.4  --   --   --   --   --   --   HGB 11.0*  < > 7.6* 7.1* 6.1* 7.2* 7.1*  HCT 33.9*  < > 22.1* 21.0* 19.1* 21.4* 21.2*  MCV 84.3  < > 84.7 85.7 87.2 85.9 84.8  PLT 340  < > 210 235 200 208 206  < > = values in this interval not displayed. No results found for this or any previous visit (from the past 240 hour(s)).   Studies:  Recent x-ray studies have been reviewed in detail by the Attending Physician  Scheduled Meds:  Scheduled Meds: . latanoprost  1 drop Both Eyes QHS  . metoprolol tartrate  37.5 mg Oral BID  . pantoprazole  40 mg Oral Q0600  . sodium chloride  3 mL Intravenous Q12H    Time spent on care of this patient: 35 mins  Cherene Altes, MD [facility] For Consults/Admissions - Flow Manager - [phone] Office  [phone]  Contact MD directly via text page:      [email or web address]      password [email or web address]  08/08/2014, 12:00 PM   LOS: 5 days

## 2014-08-08 NOTE — Clinical Social Work Psychosocial (Signed)
     Clinical Social Work Department BRIEF PSYCHOSOCIAL ASSESSMENT 08/08/2014  Patient:  [name], [name]     Account Number:  [id number]     Admit date:  08/03/2014  Clinical Social Worker:  Adair Laundry  Date/Time:  08/08/2014 04:20 PM  Referred by:  Physician  Date Referred:  08/08/2014 Referred for  SNF Placement   Other Referral:   Interview type:  Family Other interview type:   Spoke with pt granddaughter over the phone    PSYCHOSOCIAL DATA Living Status:  ALONE Admitted from facility:   Level of care:   Primary support name:  [name] Primary support relationship to patient:  FAMILY Degree of support available:   Pt has strong family support    CURRENT CONCERNS Current Concerns  Post-Acute Placement   Other Concerns:    SOCIAL WORK ASSESSMENT / PLAN CSW aware of PT recommendation. CSW called pt grannddaughter [name] to discuss. [name] informed CSW that pt does not have a HCPOA but for about four years now since pt was diagnosed with cancer she has been helping with these decisions. [name] confirmed that all of the family is on the same page and she discusses all decisions with family. CSW did notify [name] that if pt children contact CSW with other information CSW will need to go by what they decide as they are legally pt decision makers since pt is confused. [name] was understanding of this.  CSW discussed rehab recommendation with [name]. [name] explained that pt has not been to SNF or CIR before but has received home health therapy. CSW explained the differences in therapy levels. [name] was understanding. CSW explained SNF referral process and [name] is agreeable to referral being sent to all [facility]. [name] asked appropriate questions and seems to have a relatively good knowledge of pt condition. She has no further questions at this time   Assessment/plan status:  Psychosocial Support/Ongoing Assessment of Needs Other assessment/ plan:    Information/referral to community resources:   SNF list to be provided with bed offers    PATIENTS/FAMILYS RESPONSE TO PLAN OF CARE: Pt family pleasant and cooperative. Agreeable to plan to send out referral to SNFs and continue to evaluate needs before making dc decision.      [place], [place]

## 2014-08-08 NOTE — Progress Notes (Signed)
Received pre-screen request for inpatient rehab from PT and have reviewed pt's case. I noted that pt needed min to mod assist with the sit to stand transition and ambulated 50' with min guard assist. We would like to see how pt progresses over the next day or so and with OT as it is likely that pt may not need the intensity of acute rehab.  I will follow pt's case and make a formal recommendation for possible rehab consult tomorrow.  Thanks.  Nanetta Batty, PT Rehabilitation Admissions Coordinator [phone]

## 2014-08-08 NOTE — Evaluation (Signed)
Physical Therapy Evaluation [name] MRN: [id number] DOB: 05-30-1922 Today's Date: 08/08/2014   History of Present Illness  Pt is a 81 y.o. female with past medical history of PSVT, history of upper GI bleed, hypertension, DJD presents to the ER with the above complaint. Patient reports feeling weak and tired over this last week however today at 2 PM in the afternoon when she went to the bathroom she had a bowel movement which was notable for dark red blood, this was followed by 2 episodes of black stools, she went and saw her PCP and was referred to the ER. Upon initial presentation to the ER she was noted to have SVT with heart rates in the 140s to 150s range with normal blood pressure this spontaneously corrected to normal sinus rhythm without any medications.   Clinical Impression  Pt admitted with above diagnosis. Pt currently with functional limitations due to the deficits listed below (see PT Problem List). At the time of PT eval pt demonstrated transfers and ambulation with +1 assist. Pt previously living alone independently, however has had some falls. Pt will benefit from skilled PT to increase their independence and safety with mobility to allow discharge to the venue listed below.       Follow Up Recommendations CIR    Equipment Recommendations  Rolling walker with 5" wheels;Other (comment) (Tub bench, hand held shower head)    Recommendations for Other Services Rehab consult     Precautions / Restrictions Precautions Precautions: Fall Precaution Comments: reports she fell two days before this admission hittng her head on the couch ("so it didn't hurt much) Restrictions Weight Bearing Restrictions: No      Mobility  Bed Mobility               General bed mobility comments: Pt sitting up in recliner chair upon PT arrival.   Transfers Overall transfer level: Needs assistance Equipment used: Rolling walker (2 wheeled) Transfers: Sit  to/from Stand Sit to Stand: Min assist;Mod assist         General transfer comment: Mod assist for initial stand from recliner. Min assist for second stand from a straight backed chair with arm rests.   Ambulation/Gait Ambulation/Gait assistance: Min guard Ambulation Distance (Feet): 50 Feet Assistive device: Rolling walker (2 wheeled) Gait Pattern/deviations: Step-through pattern;Decreased stride length;Trunk flexed Gait velocity: Decreased Gait velocity interpretation: Below normal speed for age/gender General Gait Details: Pt was able to ambulate slow and guarded. Appeared to have difficulty maneuvering the walker however when asked pt states she did not have difficulty. VC's for improved posture and to hold the walker closer.   Stairs            Wheelchair Mobility    Modified Rankin (Stroke Patients Only)       Balance Overall balance assessment: History of Falls                                           Pertinent Vitals/Pain Pain Assessment: No/denies pain    Home Living Family/patient expects to be discharged to:: Private residence Living Arrangements: Alone Available Help at Discharge: Family;Available PRN/intermittently Type of Home: House Home Access: Ramped entrance   Entrance Stairs-Number of Steps: 2 steps up in the garage Home Layout: One level Home Equipment: Cane - single point      Prior Function Level of Independence: Independent  with assistive device(s)         Comments: sometimes uses cane     Hand Dominance   Dominant Hand: Right    Extremity/Trunk Assessment   Upper Extremity Assessment: Overall WFL for tasks assessed           Lower Extremity Assessment: Defer to PT evaluation      Cervical / Trunk Assessment: Kyphotic  Communication   Communication: No difficulties  Cognition Arousal/Alertness: Awake/alert Behavior During Therapy: WFL for tasks assessed/performed Overall Cognitive Status:  (pt  could not figure out how to get the lotion to come out of the bottle)                      General Comments      Exercises        Assessment/Plan    PT Assessment Patient needs continued PT services  PT Diagnosis Difficulty walking;Generalized weakness   PT Problem List Decreased strength;Decreased range of motion;Decreased activity tolerance;Decreased balance;Decreased mobility;Decreased knowledge of use of DME;Decreased safety awareness;Decreased knowledge of precautions  PT Treatment Interventions DME instruction;Gait training;Stair training;Functional mobility training;Therapeutic activities;Therapeutic exercise;Neuromuscular re-education;Patient/family education   PT Goals (Current goals can be found in the Care Plan section) Acute Rehab PT Goals Patient Stated Goal: Be safe at home PT Goal Formulation: With patient Time For Goal Achievement: 08/22/14 Potential to Achieve Goals: Good    Frequency Min 3X/week   Barriers to discharge Decreased caregiver support Pt lives alone    Co-evaluation               End of Session Equipment Utilized During Treatment: Gait belt Activity Tolerance: Patient tolerated treatment well Patient left: in chair;with call bell/phone within reach;with chair alarm set Nurse Communication: Mobility status         Time: 1610-9604 PT Time Calculation (min) (ACUTE ONLY): 24 min   Charges:   PT Evaluation $Initial PT Evaluation Tier I: 1 Procedure PT Treatments $Gait Training: 8-22 mins   PT G Codes:        Rolinda Roan Sep 04, 2014, 2:43 PM  Rolinda Roan, PT, DPT Acute Rehabilitation Services Pager: [phone]

## 2014-08-09 DIAGNOSIS — E875 Hyperkalemia: Secondary | ICD-10-CM | POA: Insufficient documentation

## 2014-08-09 DIAGNOSIS — R5381 Other malaise: Secondary | ICD-10-CM | POA: Insufficient documentation

## 2014-08-09 LAB — CBC
HCT: 23.2 % — ABNORMAL LOW (ref 36.0–46.0)
Hemoglobin: 7.8 g/dL — ABNORMAL LOW (ref 12.0–15.0)
MCH: 28.1 pg (ref 26.0–34.0)
MCHC: 33.6 g/dL (ref 30.0–36.0)
MCV: 83.5 fL (ref 78.0–100.0)
Platelets: 243 10*3/uL (ref 150–400)
RBC: 2.78 MIL/uL — ABNORMAL LOW (ref 3.87–5.11)
RDW: 17.6 % — AB (ref 11.5–15.5)
WBC: 9.4 10*3/uL (ref 4.0–10.5)

## 2014-08-09 MED ORDER — PANTOPRAZOLE SODIUM 40 MG PO TBEC
40.0000 mg | DELAYED_RELEASE_TABLET | Freq: Two times a day (BID) | ORAL | Status: DC
  Administered 2014-08-09 – 2014-08-10 (×2): 40 mg via ORAL
  Filled 2014-08-09 (×2): qty 1

## 2014-08-09 NOTE — Progress Notes (Signed)
CSW spoke with patient's son- Daun Rens this afternoon to inquire if there was a HCPOA for patient or to determine who would be the "contact" person for patient to discuss d/c plans.  Per son- there is no HCPOA- patient has 1 daughter and 3 sons; she also has a granddaughter Louretta Shorten who has been the "go to" person for information.  Son states that he will talk to his siblings tonight but feels that Louretta Shorten needs to the the contact person and she can relay information to the family.  Should this change- he will call CSW back.  CSW discussed PT referral for CIR and for SNF rehab as possible d/c option.  Son asked if they would be "allowed" to take patient home rather than seek rehab in a nursing center if CIR cannot accept patient.  This option was discussed and son was notified that home health services could be arranged but patient would require 24 hour care at home.  He plans to talk to his siblings and Louretta Shorten tonight and requested CSW follow up tomorrow.  Lorie Phenix. Pauline Good, [place]

## 2014-08-09 NOTE — Progress Notes (Signed)
Inpatient Rehabilitation   I met with the patient and several family members including her daughter [name] at the bedside to discuss her post acute rehab alternatives.  I also spoke with Louretta Shorten, granddaughter via speaker phone.  Pt. And family would like for pt. To come to [facility] for her rehab recovery.  I await medical clearance from Dr. Sherral Hammers.  If he is agreeable, I can admit pt. This afternoon.  Will update acute team.  Thanks.  [place] Admissions Coordinator Cell [phone] Office [phone]

## 2014-08-09 NOTE — Progress Notes (Signed)
Inpatient Rehabilitation  Dr.Woods states pt. To have a GI consult tomorrow so will remain on acute overnight.  I will follow up tomorrow.    [place] Admissions Coordinator Cell [phone] Office [phone]

## 2014-08-09 NOTE — Progress Notes (Signed)
Daily Rounding Note  08/09/2014, 4:26 PM  LOS: 6 days   SUBJECTIVE:       Asked to revisit Mrs. Yum.  She required the first set of transfusions of 2 units of packed red blood cells today.  Her hemoglobin for the past few days had been 7.2-7.1. It was 7.8 this morning prior to transfusion.  Interestingly, though her hemoglobin had dropped to 6.1 two days ago, she did not receive the transfusion until today.   Lab was reported as black and loose on 08/08/14, earlier that same day it was reported as bloody.  Reviewing charted stools, she has never had resumption of brown, normal appearing stools. They are consistently described as either black or bloody.  Patient underwent upper endoscopy on 08/04/14 Dr. Deatra Ina. Findings included an esophageal web otherwise the study was normal.  Because she is aged and frail, Dr. Deatra Ina did not wish to pursue colonoscopy. A CT abdomen pelvis with contrast of 08/05/14 demonstrated significant diverticular changes in the sigmoid colon.  Bilateral internal iliac artery aneurysms with considerable mural thrombus were only minimally enlarged compared with study of 2012.  Bleeding volume has not been significant enough to warrant a tagged red blood cell scan.  Patient and daughter report that bleeding has been primarily dark without frank blood. She has felt constipation with hard stools.  OBJECTIVE:         Vital signs in last 24 hours:    Temp:  [97.8 F (36.6 C)-98.7 F (37.1 C)] 98.7 F (37.1 C) (03/08 1350) Pulse Rate:  [56-70] 56 (03/08 1350) Resp:  [17-22] 18 (03/08 1350) BP: (120-166)/(45-60) 120/45 mmHg (03/08 1350) SpO2:  [98 %-100 %] 100 % (03/08 1350) Weight:  [160 lb (72.576 kg)] 160 lb (72.576 kg) (03/08 0511) Last BM Date: 08/08/14 Filed Weights   08/06/14 0520 08/08/14 0500 08/09/14 0511  Weight: 157 lb 1.6 oz (71.26 kg) 156 lb (70.761 kg) 160 lb (72.576 kg)  Gen: awake, alert,  NAD HEENT: anicteric, op clear CV: RRR Pulm: CTA b/l Abd: soft, NT/ND, +BS throughout Ext: no c/c/e Neuro: nonfocal   Intake/Output from previous day: 03/07 0701 - 03/08 0700 In: 747.5 [P.O.:180; Blood:567.5] Out: 800 [Urine:800]  Intake/Output this shift: Total I/O In: 240 [P.O.:240] Out: 250 [Urine:250]  Lab Results:  Recent Labs  08/07/14 1457 08/08/14 0514 08/09/14 0540  WBC 9.8 9.4 9.4  HGB 7.2* 7.1* 7.8*  HCT 21.4* 21.2* 23.2*  PLT 208 206 243   BMET  Recent Labs  08/07/14 0408  NA 143  K 3.6  CL 115*  CO2 22  GLUCOSE 96  BUN 15  CREATININE 0.80  CALCIUM 8.0*      ASSESMENT:   81 year old female with history of PSVT, history of gastritis, diverticulosis, hypertension, DJD who was admitted with dark stools for 2 days and weakness. She was seen by GI consult service last week an upper endoscopy was unremarkable. At that time she was having melena of unclear etiology. Similar presentation in 2014. CT scan performed to rule out colonic mass lesion or overt inflammation showed diverticulosis severe in the left colon but no obvious mass lesion. Fairly stable recent hemoglobin though 2 units packed cells given today   PLAN   Probable low-grade GI blood loss, s/p transfusion with 2 units today --Discussed further evaluation with colonoscopy with the patient and daughter who is at bedside today. The daughter and patient specifically stated they do not wish to pursue  colonoscopy at this time. I do not think she is bleeding briskly enough to warrant or benefit from a tagged red cell scan localization of bleeding. Further evaluation of such bleeding would be endoscopic which they decline definitively today. They also declined colonoscopy in 2014 after fairly unremarkable upper endoscopy also performed for bleeding. --Therefore would continue to monitor hemoglobin, particular after transfusion. Monitor for ongoing blood loss, and transfuse as necessary --Avoid  NSAIDs, continue once daily PPI. Jerene Bears, MD   Azucena Freed  08/09/2014, 4:26 PM Pager: [phone]

## 2014-08-09 NOTE — Consult Note (Signed)
Physical Medicine and Rehabilitation Consult Reason for Consult: Debilitation/upper GI bleed  Referring Physician: [facility]   HPI: [name] is a 81 y.o.right handed  female with history of PSVT maintained on aspirin 81 mg daily, upper GI bleed August 2014. Independent with a cane prior to admission living alone. Presented 08/03/2014 with generalized weakness and feeling tired over the past week. She had a bowel movement which was notable for dark red blood. Patient noted also take daily mobic. Noted heart rate in the 140s to 150s. Hemoglobin 8.8-6.1 down from baseline 12.5. Started on intravenous Protonix. Gastroenterology service was consulted. Underwent EGD 08/04/2014 per Dr. Deatra Ina that showed no fresh blood and exam was otherwise unremarkable. Colonoscopy was not felt to be appropriate giving her advanced age. CT of abdomen and pelvis showed bilateral internal iliac artery aneurysms with considerable mural thrombus. These were only minimally enlarged when compared with prior scan was 2012. She was not a candidate for anticoagulation. Patient was transfused with latest hemoglobin 7.8. She remained on metoprolol for chronic SVT and echocardiogram unremarkable. Bouts of delirium that has improved. Due to QTC prolonged it was advised to avoid any Haldol or Seroquel and to use Ativan if only absolutely necessary. She is tolerating a regular diet. Physical therapy evaluation completed 08/08/2014 with recommendations of physical medicine rehabilitation consult. Patient was admitted for comprehensive rehabilitation program   Review of Systems  Cardiovascular: Positive for palpitations.  Gastrointestinal: Positive for constipation.  Musculoskeletal: Positive for myalgias and joint pain.  All other systems reviewed and are negative.  Past Medical History  Diagnosis Date  . Hypertension   . Cancer 2011, 2012    left breast cancer 2011 central excision,2012 mastectomy  . Glaucoma   .  Arthritis   . Upper GI bleed 01/2013.     Melena. HPylori negative gastritis on EGD.   [name] Complication of anesthesia     shortness of breath after procedure  . SVT (supraventricular tachycardia) 03/2014  . Protein-calorie malnutrition, severe 03/2014   Past Surgical History  Procedure Laterality Date  . Cholecystectomy    . Breast surgery  2011  . Breast surgery  2012    lt breast masty  . Abdominal hysterectomy    . Esophagogastroduodenoscopy N/A 01/25/2013    Procedure: ESOPHAGOGASTRODUODENOSCOPY (EGD);  Surgeon: Jerene Bears, MD;  Location: [facility];  Service: Gastroenterology;  Laterality: N/A;  . Esophagogastroduodenoscopy (egd) with propofol  08/04/2014    Procedure: ESOPHAGOGASTRODUODENOSCOPY (EGD) WITH PROPOFOL;  Surgeon: Inda Castle, MD;  Location: [facility] ENDOSCOPY;  Service: Endoscopy;;   Family History  Problem Relation Age of Onset  . Coronary artery disease Mother 65    MI   Social History:  reports that she has never smoked. She has never used smokeless tobacco. She reports that she does not drink alcohol or use illicit drugs. Allergies:  Allergies  Allergen Reactions  . Codeine     REACTION: causes nausea and vomiting   Medications Prior to Admission  Medication Sig Dispense Refill  . acetaminophen (TYLENOL) 325 MG tablet Take 325 mg by mouth every 6 (six) hours as needed for pain.    [name] aspirin EC 81 MG EC tablet Take 1 tablet (81 mg total) by mouth daily.    . diclofenac sodium (VOLTAREN) 1 % GEL Apply 2 g topically 4 (four) times daily. 100 g 2  . feeding supplement, RESOURCE BREEZE, (RESOURCE BREEZE) LIQD Take 1 Container by mouth 3 (three) times daily between meals.  240 mL 12  . lisinopril (PRINIVIL,ZESTRIL) 40 MG tablet Take 1 tablet (40 mg total) by mouth daily. 30 tablet 11  . metoprolol tartrate (LOPRESSOR) 25 MG tablet Take 1 tablet (25 mg total) by mouth 2 (two) times daily. 60 tablet 6  . pantoprazole (PROTONIX) 40 MG tablet Take 1 tablet (40 mg total)  by mouth daily. 30 tablet 11  . travoprost, benzalkonium, (TRAVATAN) 0.004 % ophthalmic solution Place 1 drop into both eyes at bedtime.    . trolamine salicylate (ASPERCREME) 10 % cream Apply 1 application topically as needed for muscle pain (knees, back).      Home: Home Living Family/patient expects to be discharged to:: Private residence Living Arrangements: Alone Available Help at Discharge: Family, Available PRN/intermittently Type of Home: House Home Access: Ramped entrance [place] of Steps: 2 steps up in the garage [place]: [place]: [place] - single point  Functional History: Prior Function Level of Independence: Independent with assistive device(s) Comments: sometimes uses cane Functional Status:  Mobility: Bed Mobility General bed mobility comments: Pt sitting up in recliner chair upon PT arrival.  Transfers Overall transfer level: Needs assistance Equipment used: Straight cane Transfers: Sit to/from Stand Sit to Stand:  (Mod A from recliner, min A from 3n1 (higher)) General transfer comment: Mod assist for initial stand from recliner. Min assist for second stand from a straight backed chair with arm rests.  Ambulation/Gait Ambulation/Gait assistance: Min guard Ambulation Distance (Feet): 50 Feet Assistive device: Rolling walker (2 wheeled) Gait Pattern/deviations: Step-through pattern, Decreased stride length, Trunk flexed Gait velocity: Decreased Gait velocity interpretation: Below normal speed for age/gender General Gait Details: Pt was able to ambulate slow and guarded. Appeared to have difficulty maneuvering the walker however when asked pt states she did not have difficulty. VC's for improved posture and to hold the walker closer.     ADL: ADL Overall ADL's : Needs assistance/impaired Eating/Feeding: Independent, Sitting Grooming: Wash/dry hands, Wash/dry face, Supervision/safety, Set up (with min A standing at sink) Upper  Body Bathing: Supervision/ safety, Set up (with min A standing at sink) Lower Body Bathing: Supervison/ safety, Set up (with min A standing at sink) Upper Body Dressing : Set up, Supervision/safety, Sitting Lower Body Dressing: Supervision/safety, Set up (with Mod A sit<>stand from recliner) Toilet Transfer: Ambulation, Minimal assistance, RW, BSC (over toilet) Toileting- Clothing Manipulation and Hygiene: Minimal assistance, Sit to/from stand  Cognition: Cognition Overall Cognitive Status:  (pt could not figure out how to get the lotion to come out of the bottle) Orientation Level: Oriented to person, Oriented to place, Disoriented to time, Oriented to situation Cognition Arousal/Alertness: Awake/alert Behavior During Therapy: WFL for tasks assessed/performed Overall Cognitive Status:  (pt could not figure out how to get the lotion to come out of the bottle)  Blood pressure 144/60, pulse 67, temperature 98.4 F (36.9 C), temperature source Oral, resp. rate 22, height 5\' 8"  (1.727 m), weight 72.576 kg (160 lb), SpO2 100 %. Physical Exam  Constitutional: She appears well-developed.  HENT:  Poor dentition  Eyes: EOM are normal.  Neck: Normal range of motion. Neck supple. No thyromegaly present.  Cardiovascular:  Cardiac rate controlled  Respiratory: Effort normal and breath sounds normal. No respiratory distress.  GI: Soft. Bowel sounds are normal. She exhibits no distension.  Neurological: She is alert.  Patient is able to provide her name, age, date of birth. She is able to provide place and month. Follows simple commands. UE strength 4/5 delt, bic, tricep, hands,  LE: 2+ right HF, 3 left HF, 3/5 KE and 4/5 ADF/APF. No sensory findings. Cognitively appropriate  Skin: Skin is warm and dry.  Psychiatric: She has a normal mood and affect. Her behavior is normal.    Results for orders placed or performed during the hospital encounter of 08/03/14 (from the past 24 hour(s))  Prepare RBC      Status: None   Collection Time: 08/08/14  5:51 PM  Result Value Ref Range   Order Confirmation ORDER PROCESSED BY BLOOD BANK   CBC     Status: Abnormal   Collection Time: 08/09/14  5:40 AM  Result Value Ref Range   WBC 9.4 4.0 - 10.5 K/uL   RBC 2.78 (L) 3.87 - 5.11 MIL/uL   Hemoglobin 7.8 (L) 12.0 - 15.0 g/dL   HCT 23.2 (L) 36.0 - 46.0 %   MCV 83.5 78.0 - 100.0 fL   MCH 28.1 26.0 - 34.0 pg   MCHC 33.6 30.0 - 36.0 g/dL   RDW 17.6 (H) 11.5 - 15.5 %   Platelets 243 150 - 400 K/uL   No results found.  Assessment/Plan: Diagnosis: severe deconditioning related to GIB, anemia---course complicated by delirium 1. Does the need for close, 24 hr/day medical supervision in concert with the patient's rehab needs make it unreasonable for this patient to be served in a less intensive setting? Yes 2. Co-Morbidities requiring supervision/potential complications: GERD, lumbar spondylosis, bradycardia, PSVT, malnutrition 3. Due to bladder management, bowel management, safety, skin/wound care, disease management, medication administration, pain management and patient education, does the patient require 24 hr/day rehab nursing? Yes 4. Does the patient require coordinated care of a physician, rehab nurse, PT (1-2 hrs/day, 5 days/week) and OT (1-2 hrs/day, 5 days/week) to address physical and functional deficits in the context of the above medical diagnosis(es)? Yes Addressing deficits in the following areas: balance, endurance, locomotion, strength, transferring, bowel/bladder control, bathing, dressing, feeding, grooming and toileting 5. Can the patient actively participate in an intensive therapy program of at least 3 hrs of therapy per day at least 5 days per week? Yes 6. The potential for patient to make measurable gains while on inpatient rehab is excellent 7. Anticipated functional outcomes upon discharge from inpatient rehab are modified independent  with PT, modified independent and supervision with  OT, n/a with SLP. 8. Estimated rehab length of stay to reach the above functional goals is: 7 days 9. Does the patient have adequate social supports and living environment to accommodate these discharge functional goals? Yes 10. Anticipated D/C setting: Home 11. Anticipated post D/C treatments: HH therapy and Outpatient therapy 12. Overall Rehab/Functional Prognosis: excellent  RECOMMENDATIONS: This patient's condition is appropriate for continued rehabilitative care in the following setting: CIR Patient has agreed to participate in recommended program. Yes Note that insurance prior authorization may be required for reimbursement for recommended care.  Comment: Rehab Admissions Coordinator to follow up.  Thanks,  [name], MD, Mellody Drown     08/09/2014

## 2014-08-09 NOTE — Progress Notes (Signed)
[place] TEAM 1 - Stepdown/ICU TEAM Progress Note  [name] EXB:[id number] DOB: 26-Mar-1922 DOA: 08/03/2014 PCP: Olga Millers, MD  Admit HPI / Brief Narrative: 81  Yo BF PMHx PSVT, history of upper GI bleed due to gastritis, hypertension, DJD, Presented to the ED from PCP office with dark stools for the past 2 days.  She states she has also been feeling lightheaded and weak for the past week.  She denies any dizziness, shortness of breath, chest pain, abdominal pain, hematemesis, hematochezia, or BRBPR. She is on a daily aspirin 81 mg, denies other NSAID or alcohol use. She had previous EGD 2014 with evidence of gastritis, and is on daily PPIs.  In ED, EKG with evidence of SVT with HR in the 140s-150s which spontaneously corrected,  Hgb stable 12.2. GI was consulted, and pt is admitted for further management.    HPI/Subjective: 3/8 A/O 4. Negative lightheadedness when ambulating around room. Positive melena, however states decreased from admission. Denies any dizziness shortness of breath chest pain or abdominal pain.  Assessment/Plan:  GI bleed associated with diverticulosis -Continued melena, although all anticoagulants stopped.  -3/8 Hgb= 7 .8 -EGD 2014- evidence of gastritis, no previous colonscopy -EGD 03/03; nondiagnostic for bleed -Continue PPI every 12 -Hold aspirin -Transfuse for CBC< 8, PRN -Per GI most likely diverticular bleed; would not perform colonoscopy at her age. -Unfortunate patient continues to bleed and off to require transfusions have reconsulted GI for recommendations.  Hyperkalemia -resolved  Acute on chronic SVT -Continue daily metoprolol; rate controlled -Echocardiogram complete see results below  Hypertension -Continue metoprolol to 37.5 BID   DJD Tylenol when necessary  History of breast cancer  Deconditioning -Patient has been accepted to [facility] any recommendations from GI (colonoscopy?) prior to discharging to  CIR    Code Status: DNR Family Communication: multiple family present at time of exam Disposition Plan: SDU   Consultants: Dr.Robert Shaaron Adler ( GI)  Procedure/Significant Events: 3/3 EGD-Esophageal web. 3/3 echocardiogram;- Left ventricle: mild concentric hypertrophy. -LVEF= 65% to 70%.  - (grade 1 diastolic dysfunction). - Aortic valve: mild to moderate regurgitation  -Tricuspid valve: Mild, late systolicprolapse. 3/4 transfused 1 unit PRBC 3/6 transfused 1 unit PRBC 3/7 transfused 1 unit PRBC   Culture None  Antibiotics: None  DVT prophylaxis: SCDs   Devices None  LINES / TUBES:  None    Continuous Infusions:    Objective: VITAL SIGNS: Temp: 98.7 F (37.1 C) (03/08 1350) Temp Source: Oral (03/08 1350) BP: 120/45 mmHg (03/08 1350) Pulse Rate: 56 (03/08 1350) SPO2; FIO2:   Intake/Output Summary (Last 24 hours) at 08/09/14 1726 Last data filed at 08/09/14 0840  Gross per 24 hour  Intake  987.5 ml  Output    650 ml  Net  337.5 ml     Exam: General: A/O 4 sitting comfortably in bed.NAD Lungs: Clear to auscultation bilaterally without wheezes or crackles Cardiovascular: Regular rate and rhythm without murmur gallop or rub normal S1 and S2 Abdomen: Nontender, nondistended, soft, bowel sounds positive, no rebound, no ascites, no appreciable mass Extremities: No significant cyanosis, clubbing, or edema bilateral lower extremities  Data Reviewed: Basic Metabolic Panel:  Recent Labs Lab 08/03/14 1743 08/03/14 1748 08/04/14 0638 08/06/14 0220 08/07/14 0408  NA 140 142 141 142 143  K 5.5* 3.4* 3.7 3.1* 3.6  CL 106 109 108 114* 115*  CO2  --  25 26 23 22   GLUCOSE 130* 119* 108* 90 96  BUN 27* 15 19 12  15  CREATININE 0.90 0.89 0.80 0.76 0.80  CALCIUM  --  8.7 8.3* 8.1* 8.0*  MG  --  2.1  --   --  1.9   Liver Function Tests:  Recent Labs Lab 08/03/14 1748 08/06/14 0220  AST 17 14  ALT 8 7  ALKPHOS 71 54  BILITOT 0.6 0.9  PROT 6.1  4.8*  ALBUMIN 3.4* 2.6*   No results for input(s): LIPASE, AMYLASE in the last 168 hours. No results for input(s): AMMONIA in the last 168 hours. CBC:  Recent Labs Lab 08/03/14 1705  08/06/14 1655 08/07/14 0408 08/07/14 1457 08/08/14 0514 08/09/14 0540  WBC 8.4  < > 9.4 9.0 9.8 9.4 9.4  NEUTROABS 6.4  --   --   --   --   --   --   HGB 11.0*  < > 7.1* 6.1* 7.2* 7.1* 7.8*  HCT 33.9*  < > 21.0* 19.1* 21.4* 21.2* 23.2*  MCV 84.3  < > 85.7 87.2 85.9 84.8 83.5  PLT 340  < > 235 200 208 206 243  < > = values in this interval not displayed. Cardiac Enzymes: No results for input(s): CKTOTAL, CKMB, CKMBINDEX, TROPONINI in the last 168 hours. BNP (last 3 results) No results for input(s): BNP in the last 8760 hours.  ProBNP (last 3 results)  Recent Labs  02/23/14 1757  PROBNP 154.1    CBG: No results for input(s): GLUCAP in the last 168 hours.  No results found for this or any previous visit (from the past 240 hour(s)).   Studies:  Recent x-ray studies have been reviewed in detail by the Attending Physician  Scheduled Meds:  Scheduled Meds: . sodium chloride   Intravenous Once  . latanoprost  1 drop Both Eyes QHS  . metoprolol tartrate  37.5 mg Oral BID  . pantoprazole  40 mg Oral Q0600  . sodium chloride  3 mL Intravenous Q12H    Time spent on care of this patient: 40 mins   Allie Bossier [facility]  [facility] Office  [phone] Pager - [phone]  On-Call/Text Page:      [name]      password [email or web address]  If 7PM-7AM, please contact night-coverage [email or web address] Password [email or web address] 08/09/2014, 5:26 PM   LOS: 6 days    Care during the described time interval was provided by me . I have reviewed this patient's available data, including medical history, events of note, physical examination, radiology studies and test results as part of my evaluation  [name], MD [phone] Pager

## 2014-08-10 ENCOUNTER — Encounter (HOSPITAL_COMMUNITY): Payer: Self-pay | Admitting: *Deleted

## 2014-08-10 ENCOUNTER — Inpatient Hospital Stay (HOSPITAL_COMMUNITY): Payer: Medicare Other

## 2014-08-10 ENCOUNTER — Inpatient Hospital Stay (HOSPITAL_COMMUNITY)
Admission: RE | Admit: 2014-08-10 | Discharge: 2014-08-20 | DRG: 948 | Disposition: A | Discharge status: Home Health | Payer: Medicare Other | Source: Intra-hospital | Attending: Physical Medicine & Rehabilitation | Admitting: Physical Medicine & Rehabilitation

## 2014-08-10 ENCOUNTER — Inpatient Hospital Stay (HOSPITAL_COMMUNITY): Payer: Medicare Other | Admitting: *Deleted

## 2014-08-10 DIAGNOSIS — R5381 Other malaise: Secondary | ICD-10-CM | POA: Diagnosis not present

## 2014-08-10 DIAGNOSIS — I471 Supraventricular tachycardia: Secondary | ICD-10-CM | POA: Diagnosis not present

## 2014-08-10 DIAGNOSIS — K922 Gastrointestinal hemorrhage, unspecified: Secondary | ICD-10-CM | POA: Diagnosis present

## 2014-08-10 DIAGNOSIS — I10 Essential (primary) hypertension: Secondary | ICD-10-CM | POA: Diagnosis not present

## 2014-08-10 DIAGNOSIS — Z79899 Other long term (current) drug therapy: Secondary | ICD-10-CM | POA: Diagnosis not present

## 2014-08-10 DIAGNOSIS — K921 Melena: Secondary | ICD-10-CM | POA: Diagnosis not present

## 2014-08-10 DIAGNOSIS — D62 Acute posthemorrhagic anemia: Secondary | ICD-10-CM | POA: Diagnosis not present

## 2014-08-10 DIAGNOSIS — G8929 Other chronic pain: Secondary | ICD-10-CM

## 2014-08-10 DIAGNOSIS — S8991XA Unspecified injury of right lower leg, initial encounter: Secondary | ICD-10-CM | POA: Diagnosis not present

## 2014-08-10 DIAGNOSIS — R54 Age-related physical debility: Secondary | ICD-10-CM | POA: Diagnosis present

## 2014-08-10 DIAGNOSIS — M25561 Pain in right knee: Secondary | ICD-10-CM | POA: Diagnosis not present

## 2014-08-10 DIAGNOSIS — R350 Frequency of micturition: Secondary | ICD-10-CM | POA: Diagnosis not present

## 2014-08-10 DIAGNOSIS — Z7982 Long term (current) use of aspirin: Secondary | ICD-10-CM

## 2014-08-10 DIAGNOSIS — Z853 Personal history of malignant neoplasm of breast: Secondary | ICD-10-CM

## 2014-08-10 DIAGNOSIS — M1711 Unilateral primary osteoarthritis, right knee: Secondary | ICD-10-CM | POA: Diagnosis not present

## 2014-08-10 LAB — BASIC METABOLIC PANEL
Anion gap: 5 (ref 5–15)
BUN: 19 mg/dL (ref 6–23)
CO2: 26 mmol/L (ref 19–32)
CREATININE: 0.72 mg/dL (ref 0.50–1.10)
Calcium: 8.2 mg/dL — ABNORMAL LOW (ref 8.4–10.5)
Chloride: 111 mmol/L (ref 96–112)
GFR, EST AFRICAN AMERICAN: 85 mL/min — AB (ref >90)
GFR, EST NON AFRICAN AMERICAN: 73 mL/min — AB (ref >90)
GLUCOSE: 104 mg/dL — AB (ref 70–99)
Potassium: 3.6 mmol/L (ref 3.5–5.1)
Sodium: 142 mmol/L (ref 135–145)

## 2014-08-10 LAB — TYPE AND SCREEN
ABO/RH(D): B POS
ANTIBODY SCREEN: NEGATIVE
UNIT DIVISION: 0
Unit division: 0
Unit division: 0
Unit division: 0

## 2014-08-10 LAB — COMPREHENSIVE METABOLIC PANEL
ALT: 7 U/L (ref 0–35)
AST: 15 U/L (ref 0–37)
Albumin: 2.4 g/dL — ABNORMAL LOW (ref 3.5–5.2)
Alkaline Phosphatase: 58 U/L (ref 39–117)
Anion gap: 6 (ref 5–15)
BILIRUBIN TOTAL: 0.8 mg/dL (ref 0.3–1.2)
BUN: 21 mg/dL (ref 6–23)
CO2: 23 mmol/L (ref 19–32)
Calcium: 8 mg/dL — ABNORMAL LOW (ref 8.4–10.5)
Chloride: 110 mmol/L (ref 96–112)
Creatinine, Ser: 0.8 mg/dL (ref 0.50–1.10)
GFR calc Af Amer: 73 mL/min — ABNORMAL LOW (ref >90)
GFR calc non Af Amer: 63 mL/min — ABNORMAL LOW (ref >90)
Glucose, Bld: 120 mg/dL — ABNORMAL HIGH (ref 70–99)
POTASSIUM: 3.5 mmol/L (ref 3.5–5.1)
Sodium: 139 mmol/L (ref 135–145)
Total Protein: 4.6 g/dL — ABNORMAL LOW (ref 6.0–8.3)

## 2014-08-10 LAB — CBC
HCT: 20.9 % — ABNORMAL LOW (ref 36.0–46.0)
HEMOGLOBIN: 7.2 g/dL — AB (ref 12.0–15.0)
MCH: 29.6 pg (ref 26.0–34.0)
MCHC: 34.4 g/dL (ref 30.0–36.0)
MCV: 86 fL (ref 78.0–100.0)
PLATELETS: 237 10*3/uL (ref 150–400)
RBC: 2.43 MIL/uL — AB (ref 3.87–5.11)
RDW: 17.7 % — AB (ref 11.5–15.5)
WBC: 10.4 10*3/uL (ref 4.0–10.5)

## 2014-08-10 LAB — CBC WITH DIFFERENTIAL/PLATELET
BASOS PCT: 0 % (ref 0–1)
Basophils Absolute: 0 10*3/uL (ref 0.0–0.1)
EOS ABS: 0.3 10*3/uL (ref 0.0–0.7)
Eosinophils Relative: 2 % (ref 0–5)
HCT: 24.9 % — ABNORMAL LOW (ref 36.0–46.0)
Hemoglobin: 8.3 g/dL — ABNORMAL LOW (ref 12.0–15.0)
Lymphocytes Relative: 13 % (ref 12–46)
Lymphs Abs: 1.7 10*3/uL (ref 0.7–4.0)
MCH: 28.1 pg (ref 26.0–34.0)
MCHC: 33.3 g/dL (ref 30.0–36.0)
MCV: 84.4 fL (ref 78.0–100.0)
MONO ABS: 0.9 10*3/uL (ref 0.1–1.0)
Monocytes Relative: 7 % (ref 3–12)
Neutro Abs: 9.8 10*3/uL — ABNORMAL HIGH (ref 1.7–7.7)
Neutrophils Relative %: 78 % — ABNORMAL HIGH (ref 43–77)
Platelets: 255 10*3/uL (ref 150–400)
RBC: 2.95 MIL/uL — ABNORMAL LOW (ref 3.87–5.11)
RDW: 16.6 % — AB (ref 11.5–15.5)
WBC: 12.7 10*3/uL — ABNORMAL HIGH (ref 4.0–10.5)

## 2014-08-10 LAB — PREPARE RBC (CROSSMATCH)

## 2014-08-10 MED ORDER — METOPROLOL TARTRATE 25 MG PO TABS: 12.5000 mg | ORAL_TABLET | Freq: Two times a day (BID) | ORAL | Status: DC

## 2014-08-10 MED ORDER — LATANOPROST 0.005 % OP SOLN
1.0000 [drp] | Freq: Every day | OPHTHALMIC | Status: DC
  Administered 2014-08-10 – 2014-08-19 (×10): 1 [drp] via OPHTHALMIC
  Filled 2014-08-10: qty 2.5

## 2014-08-10 MED ORDER — ACETAMINOPHEN 325 MG PO TABS
325.0000 mg | ORAL_TABLET | ORAL | Status: DC | PRN
  Administered 2014-08-15 – 2014-08-19 (×4): 650 mg via ORAL
  Filled 2014-08-10 (×4): qty 2

## 2014-08-10 MED ORDER — ONDANSETRON HCL 4 MG PO TABS: 4.0000 mg | ORAL_TABLET | Freq: Four times a day (QID) | ORAL | Status: DC | PRN

## 2014-08-10 MED ORDER — SORBITOL 70 % SOLN
30.0000 mL | Freq: Every day | Status: DC | PRN
  Administered 2014-08-18 – 2014-08-19 (×2): 30 mL via ORAL
  Filled 2014-08-10 (×2): qty 30

## 2014-08-10 MED ORDER — METOPROLOL TARTRATE 25 MG PO TABS
37.5000 mg | ORAL_TABLET | Freq: Two times a day (BID) | ORAL | Status: DC
  Administered 2014-08-10 – 2014-08-20 (×20): 37.5 mg via ORAL
  Filled 2014-08-10 (×23): qty 1

## 2014-08-10 MED ORDER — SODIUM CHLORIDE 0.9 % IV SOLN
Freq: Once | INTRAVENOUS | Status: AC
  Administered 2014-08-10: 17:00:00 via INTRAVENOUS

## 2014-08-10 MED ORDER — MUSCLE RUB 10-15 % EX CREA
TOPICAL_CREAM | CUTANEOUS | Status: DC | PRN
  Administered 2014-08-15: 13:00:00 via TOPICAL
  Filled 2014-08-10: qty 85

## 2014-08-10 MED ORDER — ONDANSETRON HCL 4 MG/2ML IJ SOLN: 4.0000 mg | Freq: Four times a day (QID) | INTRAMUSCULAR | Status: DC | PRN

## 2014-08-10 MED ORDER — PANTOPRAZOLE SODIUM 40 MG PO TBEC: 40.0000 mg | DELAYED_RELEASE_TABLET | Freq: Two times a day (BID) | ORAL | Status: DC

## 2014-08-10 MED ORDER — PANTOPRAZOLE SODIUM 40 MG PO TBEC
40.0000 mg | DELAYED_RELEASE_TABLET | Freq: Two times a day (BID) | ORAL | Status: DC
  Administered 2014-08-10 – 2014-08-20 (×20): 40 mg via ORAL
  Filled 2014-08-10 (×23): qty 1

## 2014-08-10 MED ORDER — SODIUM CHLORIDE 0.9 % IV SOLN: Freq: Once | INTRAVENOUS | Status: AC

## 2014-08-10 NOTE — Discharge Instructions (Signed)
Anemia, Nonspecific Anemia is a condition in which the concentration of red blood cells or hemoglobin in the blood is below normal. Hemoglobin is a substance in red blood cells that carries oxygen to the tissues of the body. Anemia results in not enough oxygen reaching these tissues.  CAUSES  Common causes of anemia include:   Excessive bleeding. Bleeding may be internal or external. This includes excessive bleeding from periods (in women) or from the intestine.   Poor nutrition.   Chronic kidney, thyroid, and liver disease.  Bone marrow disorders that decrease red blood cell production.  Cancer and treatments for cancer.  HIV, AIDS, and their treatments.  Spleen problems that increase red blood cell destruction.  Blood disorders.  Excess destruction of red blood cells due to infection, medicines, and autoimmune disorders. SIGNS AND SYMPTOMS   Minor weakness.   Dizziness.   Headache.  Palpitations.   Shortness of breath, especially with exercise.   Paleness.  Cold sensitivity.  Indigestion.  Nausea.  Difficulty sleeping.  Difficulty concentrating. Symptoms may occur suddenly or they may develop slowly.  DIAGNOSIS  Additional blood tests are often needed. These help your health care provider determine the best treatment. Your health care provider will check your stool for blood and look for other causes of blood loss.  TREATMENT  Treatment varies depending on the cause of the anemia. Treatment can include:   Supplements of iron, vitamin B12, or folic acid.   Hormone medicines.   A blood transfusion. This may be needed if blood loss is severe.   Hospitalization. This may be needed if there is significant continual blood loss.   Dietary changes.  Spleen removal. HOME CARE INSTRUCTIONS Keep all follow-up appointments. It often takes many weeks to correct anemia, and having your health care provider check on your condition and your response to  treatment is very important. SEEK IMMEDIATE MEDICAL CARE IF:   You develop extreme weakness, shortness of breath, or chest pain.   You become dizzy or have trouble concentrating.  You develop heavy vaginal bleeding.   You develop a rash.   You have bloody or black, tarry stools.   You faint.   You vomit up blood.   You vomit repeatedly.   You have abdominal pain.  You have a fever or persistent symptoms for more than 2-3 days.   You have a fever and your symptoms suddenly get worse.   You are dehydrated.  MAKE SURE YOU:  Understand these instructions.  Will watch your condition.  Will get help right away if you are not doing well or get worse. Document Released: 06/27/2004 Document Revised: 01/20/2013 Document Reviewed: 11/13/2012 ExitCare Patient Information 2015 [employer]. This information is not intended to replace advice given to you by your health care provider. Make sure you discuss any questions you have with your health care provider.  

## 2014-08-10 NOTE — Discharge Summary (Addendum)
Physician Discharge Summary  [name] TGG:[id number] DOB: 1921/08/05 DOA: 08/03/2014  PCP: Olga Millers, MD  Admit date: 08/03/2014 Discharge date: 08/10/2014  Recommendations for Outpatient Follow-up:  1. Keep hemoglobin above 7. Transfuse if hemoglobin is less than 7. 2. Avoid aspirin until hemoglobin stabilizes.  3. Keep metoprolol but at lower dose 12.5 mg BID for BP control. May continue to hold lisinopril unless BP above 140/90. If HR increases you may increase metoprolol dose.  Discharge Diagnoses:  Active Problems:   Essential hypertension   GERD   DJD (degenerative joint disease)   SVT (supraventricular tachycardia)   GI bleed   Melena   Bleeding gastrointestinal   Acute blood loss anemia   Physical deconditioning   Hyperkalemia    Discharge Condition: stable   Diet recommendation: as tolerated   History of present illness:  81 year old female with past medical history of PSVT, upper GI bleed due to gastritis, hypertension, DJD who presented to [facility] ED from PCP office with complaints of darks tools for 2 days PTA. She reported associated lightheadedness and weakness. No shortness of breath, chest pain or falls or loss of consciousness. She is on a daily aspirin 81 mg but not taking any other NSAID or alcohol. She had previous EGD 2014 with evidence of gastritis and is on daily PPIs.  In ED, EKG showed SVT with HR in the 140s-150s which spontaneously corrected, Hgb was stable 12.2. GI has seen the pt in consultation.   Hospital Course:   Assessment/Plan:  Active problems: Upper GI bleed secondary to gastritis / acute blood loss anemia  - aspirin on hold - has received PRBC transfusion total 3 times during this admission - Will get 1 more PRBC unit transfused prior to D?C to rehab - EGD 03/03; nondiagnostic for bleed - Continue protonix 40 mg IV Q 12 hours - GI has seen the pt in consultation, pt declined having diagnostic studies done    Hyperkalemia - Unclear etiology, resolved  Acute on chronic SVT - Continue metoprolol at 12.5 mg BID - 2 D ECHO showed EF 65%  Hypertension -Continue metoprolol 12.5 mg BID since now HR in brady range, 56 - 65.  DJD - may resume tylenol as needed for pain control  History of breast cancer  Deconditioning - D/C to inpatient rehab   DVT prophylaxis: SCDs due to risk of bleeding    Code Status: DNR/DNI Family Communication: family not at the bedside    Consultants: Dr.Robert D Deatra Ina ( GI)  Procedure/Significant Events: 3/3 EGD-Esophageal web. 3/3 echocardiogram;- Left ventricle: mild concentric hypertrophy. -LVEF= 65% to 70%.  - (grade 1 diastolic dysfunction). - Aortic valve: mild to moderate regurgitation  -Tricuspid valve: Mild, late systolicprolapse. 3/4 transfused 1 unit PRBC 3/6 transfused 1 unit PRBC 3/7 transfused 1 unit PRBC   Antibiotics: None     Signed:  Leisa Lenz, MD  [facility] 08/10/2014, 10:03 AM  Pager #: [phone]  Discharge Exam: Filed Vitals:   08/10/14 0452  BP: 130/52  Pulse: 65  Temp: 98.7 F (37.1 C)  Resp: 18   Filed Vitals:   08/09/14 0600 08/09/14 1350 08/09/14 1953 08/10/14 0452  BP: 144/60 120/45 153/49 130/52  Pulse:  56 62 65  Temp:  98.7 F (37.1 C) 98.3 F (36.8 C) 98.7 F (37.1 C)  TempSrc:  Oral Oral Oral  Resp: 22 18 18 18   Height:      Weight:    73.12 kg (161 lb 3.2 oz)  SpO2:  100% 100% 100%    General: Pt is alert, follows commands appropriately, not in acute distress Cardiovascular: Regular rate and rhythm, S1/S2 + Respiratory: Clear to auscultation bilaterally, no wheezing, no crackles, no rhonchi Abdominal: Soft, non tender, non distended, bowel sounds +, no guarding Extremities: no edema, no cyanosis, pulses palpable bilaterally DP and PT Neuro: Grossly nonfocal  Discharge Instructions  Discharge Instructions    Call MD for:  difficulty breathing, headache or visual  disturbances    Complete by:  As directed      Call MD for:  persistant nausea and vomiting    Complete by:  As directed      Call MD for:  severe uncontrolled pain    Complete by:  As directed      Diet - low sodium heart healthy    Complete by:  As directed      Discharge instructions    Complete by:  As directed   Keep hemoglobin above 7. Transfuse if hemoglobin is less than 7. Avoid aspirin until hemoglobin stabilizes.  Keep metoprolol but at lower dose 12.5 mg BID for BP control. May continue to hold lisinopril unless BP above 140/90.     Increase activity slowly    Complete by:  As directed             Medication List    STOP taking these medications        aspirin 81 MG EC tablet     lisinopril 40 MG tablet  Commonly known as:  PRINIVIL,ZESTRIL      TAKE these medications        acetaminophen 325 MG tablet  Commonly known as:  TYLENOL  Take 325 mg by mouth every 6 (six) hours as needed for pain.     diclofenac sodium 1 % Gel  Commonly known as:  VOLTAREN  Apply 2 g topically 4 (four) times daily.     feeding supplement (RESOURCE BREEZE) Liqd  Take 1 Container by mouth 3 (three) times daily between meals.     metoprolol tartrate 25 MG tablet  Commonly known as:  LOPRESSOR  Take 0.5 tablets (12.5 mg total) by mouth 2 (two) times daily.     ondansetron 4 MG tablet  Commonly known as:  ZOFRAN  Take 1 tablet (4 mg total) by mouth every 6 (six) hours as needed for nausea.     pantoprazole 40 MG tablet  Commonly known as:  PROTONIX  Take 1 tablet (40 mg total) by mouth 2 (two) times daily.     travoprost (benzalkonium) 0.004 % ophthalmic solution  Commonly known as:  TRAVATAN  Place 1 drop into both eyes at bedtime.     trolamine salicylate 10 % cream  Commonly known as:  ASPERCREME  Apply 1 application topically as needed for muscle pain (knees, back).            Follow-up Information    Follow up with Olga Millers, MD. Schedule an  appointment as soon as possible for a visit in 2 weeks.   Specialty:  Internal Medicine   Why:  Follow up appt after recent hospitalization   Contact information:   [address] [place] [address] [phone]        The results of significant diagnostics from this hospitalization (including imaging, microbiology, ancillary and laboratory) are listed below for reference.    Significant Diagnostic Studies: Ct Abdomen Pelvis W Contrast  08/05/2014   CLINICAL DATA:  GI hemorrhage, evaluate for possible colonic lesions  EXAM: CT ABDOMEN AND PELVIS WITH CONTRAST  TECHNIQUE: Multidetector CT imaging of the abdomen and pelvis was performed using the standard protocol following bolus administration of intravenous contrast.  CONTRAST:  26mL OMNIPAQUE IOHEXOL 300 MG/ML  SOLN  COMPARISON:  03/25/2011  FINDINGS: The lung bases are free of acute infiltrate or sizable effusion.  The gallbladder is been surgically removed. The liver, spleen, right adrenal gland and pancreas are within normal limits. A large left adrenal myelolipoma is again seen and stable.  The kidneys are well visualized without renal calculi or urinary tract obstructive changes. Normal excretion of contrast material is noted.  Significant diverticular change of the colon is noted. This is most marked in the sigmoid colon with significant smooth muscle hypertrophy. The lesion in this area will be difficult to exclude although the diverticular change may be the etiology of the patient's underlying GI hemorrhage.  Diffuse aortoiliac calcifications are noted without aortic aneurysm. Ectasia of the common iliac arteries is noted bilaterally. A aneurysm is noted of the internal iliac artery bilaterally. This measures 3.1 cm on the left and approximately 2.2 cm on the right. Considerable mural thrombus is noted within both aneurysms. They appear only mildly enlarged. When compared with the prior exam from 2012. The bladder is well distended. No  pelvic mass lesion or sidewall abnormality is noted. The uterus has been surgically removed. Significant degenerative change of the lumbar spine is noted.  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 These are only minimally enlarged when compared with the prior exam from 2012.  Significant diverticular change particularly within the sigmoid colon as described. This may be the etiology of the patient's GI hemorrhage. No definitive colonic mass is seen.  Stable left adrenal myelolipoma.   Electronically Signed   By: Inez Catalina M.D.   On: 08/05/2014 14:02   Dg Chest Portable 1 View  08/03/2014   CLINICAL DATA:  Tachycardia.  History of breast cancer.  EXAM: PORTABLE CHEST - 1 VIEW  COMPARISON:  PA and lateral chest 04/23/2014.  FINDINGS: The lungs are clear. Heart size is upper normal. Ectatic aorta is noted. Marked degenerative change about the right shoulder is seen.  IMPRESSION: No acute disease.   Electronically Signed   By: Inge Rise M.D.   On: 08/03/2014 17:55    Microbiology: No results found for this or any previous visit (from the past 240 hour(s)).   Labs: Basic Metabolic Panel:  Recent Labs Lab 08/03/14 1748 08/04/14 0272 08/06/14 0220 08/07/14 0408 08/10/14 0615  NA 142 141 142 143 142  K 3.4* 3.7 3.1* 3.6 3.6  CL 109 108 114* 115* 111  CO2 25 26 23 22 26   GLUCOSE 119* 108* 90 96 104*  BUN 15 19 12 15 19   CREATININE 0.89 0.80 0.76 0.80 0.72  CALCIUM 8.7 8.3* 8.1* 8.0* 8.2*  MG 2.1  --   --  1.9  --    Liver Function Tests:  Recent Labs Lab 08/03/14 1748 08/06/14 0220  AST 17 14  ALT 8 7  ALKPHOS 71 54  BILITOT 0.6 0.9  PROT 6.1 4.8*  ALBUMIN 3.4* 2.6*   No results for input(s): LIPASE, AMYLASE in the last 168 hours. No results for input(s): AMMONIA in the last 168 hours. CBC:  Recent Labs Lab 08/03/14 1705  08/07/14 0408 08/07/14 1457 08/08/14 0514 08/09/14 0540 08/10/14 0615  WBC 8.4  < > 9.0 9.8 9.4  9.4 10.4  NEUTROABS 6.4  --   --   --   --   --   --   HGB 11.0*  < > 6.1* 7.2* 7.1* 7.8* 7.2*  HCT 33.9*  < > 19.1* 21.4* 21.2* 23.2* 20.9*  MCV 84.3  < > 87.2 85.9 84.8 83.5 86.0  PLT 340  < > 200 208 206 243 237  < > = values in this interval not displayed. Cardiac Enzymes: No results for input(s): CKTOTAL, CKMB, CKMBINDEX, TROPONINI in the last 168 hours. BNP: BNP (last 3 results) No results for input(s): BNP in the last 8760 hours.  ProBNP (last 3 results)  Recent Labs  02/23/14 1757  PROBNP 154.1    CBG: No results for input(s): GLUCAP in the last 168 hours.  Time coordinating discharge: Over 30 minutes

## 2014-08-10 NOTE — Progress Notes (Signed)
Inpatient Rehabilitation  I note that GI plans no further intervention.  Pt. Is to receive one unit of blood then will plan to admit to IP Rehab.  Dr. Charlies Silvers is agreeable with this.  I have discussed with Jacqlyn Krauss CM and with Poonum Ambelal, SW.  I have discussed with pt. And her daughter [name] and they are agreeable as well.  I will arrange for admit today.  Please call if questions.  [place] Admissions Coordinator Cell [phone] Office [phone]

## 2014-08-10 NOTE — Progress Notes (Signed)
CSW (Clinical Education officer, museum) aware of plan for dc to CIR today. At this time, pt has no further hospital social work needs. CSW signing off.  [place], [place]

## 2014-08-10 NOTE — Progress Notes (Signed)
Clinical Social Work Department CLINICAL SOCIAL WORK PLACEMENT NOTE 08/10/2014  Patient:  QUILLA, FREEZE  Account Number:  [id number] Admit date:  08/03/2014  Clinical Social Worker:  Berton Mount, Latanya Presser  Date/time:  08/08/2014 04:25 PM  Clinical Social Work is seeking post-discharge placement for this patient at the following level of care:   SKILLED NURSING   (*CSW will update this form in Epic as items are completed)   08/08/2014  Patient/family provided with [place] Department of Clinical Social Work's list of facilities offering this level of care within the geographic area requested by the patient (or if unable, by the patient's family).  08/08/2014  Patient/family informed of their freedom to choose among providers that offer the needed level of care, that participate in Medicare, Medicaid or managed care program needed by the patient, have an available bed and are willing to accept the patient.  08/08/2014  Patient/family informed of [facility]' ownership interest in [facility], as well as of the fact that they are under no obligation to receive care at this facility.  PASARR submitted to EDS on 08/08/2014 PASARR number received on 08/08/2014  FL2 transmitted to all facilities in geographic area requested by pt/family on  08/08/2014 FL2 transmitted to all facilities within larger geographic area on   Patient informed that his/her managed care company has contracts with or will negotiate with  certain facilities, including the following:     Patient/family informed of bed offers received:  --- Patient chooses bed at [facility] recommends and patient chooses bed at    Patient to be transferred to  on   Patient to be transferred to facility by  Patient and family notified of transfer on  Name of family member notified:    The following physician request were entered in Epic: Physician Request  Please sign FL2.    Additional CommentsBerton Mount, [place]

## 2014-08-10 NOTE — Progress Notes (Signed)
Received pt. As a transfer from [facility] [address].Pt. And family were oriented to unit and routine.Safety plan was explained,fall prevention plan was sign by family and RN.Pt. Skin is dry and intact,free of pressure ulcers.Keep assessing pt. Needs closely.

## 2014-08-10 NOTE — H&P (Signed)
Physical Medicine and Rehabilitation Admission H&P   Chief Complaint  Patient presents with  . Blood In Stools  : HPI: [name] is a 81 y.o.right handed female with history of PSVT maintained on aspirin 81 mg daily, upper GI bleed August 2014. Independent with a cane prior to admission living alone. Presented 08/03/2014 with generalized weakness and feeling tired over the past week. She had a bowel movement which was notable for dark red blood. Patient noted also take daily mobic. Noted heart rate in the 140s to 150s. Hemoglobin 8.8-6.1 down from baseline 12.5. Started on intravenous Protonix. Gastroenterology service was consulted. Underwent EGD 08/04/2014 per Dr. Deatra Ina that showed no fresh blood and exam was otherwise unremarkable. Colonoscopy was not felt to be appropriate giving her advanced age. CT of abdomen and pelvis showed bilateral internal iliac artery aneurysms with considerable mural thrombus. These were only minimally enlarged when compared with prior scan was 2012. She was not a candidate for anticoagulation. Patient was transfused during her hospital course with hemoglobin 7.2 this morning with  1 unit packed red blood cells today which brought hgb to 8.3. She remained on metoprolol for chronic SVT and echocardiogram unremarkable. Bouts of delirium that has improved. Due to QTC prolonged it was advised to avoid any Haldol or Seroquel and to use Ativan if only absolutely necessary. She is tolerating a regular diet. Physical therapy evaluation completed 08/08/2014 with recommendations of physical medicine rehabilitation consult. Patient was admitted for comprehensive rehabilitation program  ROS Review of Systems  Cardiovascular: Positive for palpitations.  Gastrointestinal: Positive for constipation.  Musculoskeletal: Positive for myalgias and joint pain.  All other systems reviewed and are negative   Past Medical History  Diagnosis Date  . Hypertension    . Cancer 2011, 2012    left breast cancer 2011 central excision,2012 mastectomy  . Glaucoma   . Arthritis   . Upper GI bleed 01/2013.     Melena. HPylori negative gastritis on EGD.   [name] Complication of anesthesia     shortness of breath after procedure  . SVT (supraventricular tachycardia) 03/2014  . Protein-calorie malnutrition, severe 03/2014   Past Surgical History  Procedure Laterality Date  . Cholecystectomy    . Breast surgery  2011  . Breast surgery  2012    lt breast masty  . Abdominal hysterectomy    . Esophagogastroduodenoscopy N/A 01/25/2013    Procedure: ESOPHAGOGASTRODUODENOSCOPY (EGD); Surgeon: Jerene Bears, MD; Location: [place]; Service: Gastroenterology; Laterality: N/A;  . Esophagogastroduodenoscopy (egd) with propofol  08/04/2014    Procedure: ESOPHAGOGASTRODUODENOSCOPY (EGD) WITH PROPOFOL; Surgeon: Inda Castle, MD; Location: [facility] ENDOSCOPY; Service: Endoscopy;;   Family History  Problem Relation Age of Onset  . Coronary artery disease Mother 71    MI   Social History:  reports that she has never smoked. She has never used smokeless tobacco. She reports that she does not drink alcohol or use illicit drugs. Allergies:  Allergies  Allergen Reactions  . Codeine     REACTION: causes nausea and vomiting   Medications Prior to Admission  Medication Sig Dispense Refill  . acetaminophen (TYLENOL) 325 MG tablet Take 325 mg by mouth every 6 (six) hours as needed for pain.    [name] aspirin EC 81 MG EC tablet Take 1 tablet (81 mg total) by mouth daily.    . diclofenac sodium (VOLTAREN) 1 % GEL Apply 2 g topically 4 (four) times daily. 100 g 2  . feeding supplement, RESOURCE BREEZE, (  RESOURCE BREEZE) LIQD Take 1 Container by mouth 3 (three) times daily between meals. 240 mL 12  . lisinopril (PRINIVIL,ZESTRIL) 40 MG tablet Take 1 tablet (40 mg  total) by mouth daily. 30 tablet 11  . metoprolol tartrate (LOPRESSOR) 25 MG tablet Take 1 tablet (25 mg total) by mouth 2 (two) times daily. 60 tablet 6  . pantoprazole (PROTONIX) 40 MG tablet Take 1 tablet (40 mg total) by mouth daily. 30 tablet 11  . travoprost, benzalkonium, (TRAVATAN) 0.004 % ophthalmic solution Place 1 drop into both eyes at bedtime.    . trolamine salicylate (ASPERCREME) 10 % cream Apply 1 application topically as needed for muscle pain (knees, back).      Home: Home Living Family/patient expects to be discharged to:: Private residence Living Arrangements: Alone Available Help at Discharge: Family, Available PRN/intermittently Type of Home: House Home Access: Ramped entrance [place] of Steps: 2 steps up in the garage [place]: One [place]: [place] - single point  Functional History: Prior Function Level of Independence: Independent with assistive device(s) Comments: sometimes uses cane  Functional Status:  Mobility: Bed Mobility General bed mobility comments: Pt sitting up in recliner chair upon PT arrival.  Transfers Overall transfer level: Needs assistance Equipment used: Straight cane Transfers: Sit to/from Stand Sit to Stand: (Mod A from recliner, min A from 3n1 (higher)) General transfer comment: Mod assist for initial stand from recliner. Min assist for second stand from a straight backed chair with arm rests.  Ambulation/Gait Ambulation/Gait assistance: Min guard Ambulation Distance (Feet): 50 Feet Assistive device: Rolling walker (2 wheeled) Gait Pattern/deviations: Step-through pattern, Decreased stride length, Trunk flexed Gait velocity: Decreased Gait velocity interpretation: Below normal speed for age/gender General Gait Details: Pt was able to ambulate slow and guarded. Appeared to have difficulty maneuvering the walker however when asked pt states she did not have difficulty. VC's for  improved posture and to hold the walker closer.     ADL: ADL Overall ADL's : Needs assistance/impaired Eating/Feeding: Independent, Sitting Grooming: Wash/dry hands, Wash/dry face, Supervision/safety, Set up (with min A standing at sink) Upper Body Bathing: Supervision/ safety, Set up (with min A standing at sink) Lower Body Bathing: Supervison/ safety, Set up (with min A standing at sink) Upper Body Dressing : Set up, Supervision/safety, Sitting Lower Body Dressing: Supervision/safety, Set up (with Mod A sit<>stand from recliner) Toilet Transfer: Ambulation, Minimal assistance, RW, BSC (over toilet) Toileting- Clothing Manipulation and Hygiene: Minimal assistance, Sit to/from stand  Cognition: Cognition Overall Cognitive Status: (pt could not figure out how to get the lotion to come out of the bottle) Orientation Level: Oriented to person, Oriented to place, Disoriented to time, Oriented to situation Cognition Arousal/Alertness: Awake/alert Behavior During Therapy: WFL for tasks assessed/performed Overall Cognitive Status: (pt could not figure out how to get the lotion to come out of the bottle)  Physical Exam: Blood pressure 120/45, pulse 56, temperature 98.7 F (37.1 C), temperature source Oral, resp. rate 18, height 5\' 8"  (1.727 m), weight 72.576 kg (160 lb), SpO2 100 %. Physical Exam Constitutional: She appears well-developed. no distress HENT:  Poor dentition  Eyes: EOM are normal.  Neck: Normal range of motion. Neck supple. No thyromegaly present.  Cardiovascular:  Cardiac rate controlled. Has holosystolic murmur Respiratory: Effort normal and breath sounds normal. No respiratory distress. No wheezes GI: Soft. Bowel sounds are normal. She exhibits no distension.  Neurological: She is alert.  Patient is able to provide her name, age, date of birth. She  is able to provide place and month. Follows simple commands. Bilateral UE strength 4/5 delt, bic, tricep, hands,  Bilateral LE: 2+ right HF, 3 left HF, 3/5 KE and 4/5 ADF/APF. No sensory findings. Cognitively appropriate  Skin: Skin is warm and dry.  Psychiatric: She has a normal mood and affect. Her behavior is normal  Lab Results Last 48 Hours    Results for orders placed or performed during the hospital encounter of 08/03/14 (from the past 48 hour(s))  CBC Status: Abnormal   Collection Time: 08/08/14 5:14 AM  Result Value Ref Range   WBC 9.4 4.0 - 10.5 K/uL   RBC 2.50 (L) 3.87 - 5.11 MIL/uL   Hemoglobin 7.1 (L) 12.0 - 15.0 g/dL   HCT 21.2 (L) 36.0 - 46.0 %   MCV 84.8 78.0 - 100.0 fL   MCH 28.4 26.0 - 34.0 pg   MCHC 33.5 30.0 - 36.0 g/dL   RDW 16.5 (H) 11.5 - 15.5 %   Platelets 206 150 - 400 K/uL  Prepare RBC Status: None   Collection Time: 08/08/14 5:51 PM  Result Value Ref Range   Order Confirmation ORDER PROCESSED BY BLOOD BANK   CBC Status: Abnormal   Collection Time: 08/09/14 5:40 AM  Result Value Ref Range   WBC 9.4 4.0 - 10.5 K/uL   RBC 2.78 (L) 3.87 - 5.11 MIL/uL   Hemoglobin 7.8 (L) 12.0 - 15.0 g/dL   HCT 23.2 (L) 36.0 - 46.0 %   MCV 83.5 78.0 - 100.0 fL   MCH 28.1 26.0 - 34.0 pg   MCHC 33.6 30.0 - 36.0 g/dL   RDW 17.6 (H) 11.5 - 15.5 %   Platelets 243 150 - 400 K/uL      Imaging Results (Last 48 hours)    No results found.       Medical Problem List and Plan: 1. Functional deficits secondary to severe deconditioning related to upper GI bleed/Anemia/Course complicated by Delerium. 2. DVT Prophylaxis/Anticoagulation: SCD,s 3. Pain Management: tylenol as needed 4. Hypertension/PSVT.Lopressor 37.5 mg bid. HR and BP controlled at present[[watch for bradycardia-- adjust as indicated with increased activity 5. Neuropsych: This patient is capable of making basic decisions on her own behalf. Cognition improving 6. Skin/Wound Care: Routine skin checks. Skin intact  presently. 7. Fluids/Electrolytes/Nutrition: Strict I and O in follow-up chemistries in AM. 8. GIB/Anemia: goal to keep hgb greater than 7. Received 1u prbc yesterday. Serial labs -no colonoscopy or interventions given age       Post Admission Physician Evaluation: 1. Functional deficits secondary to deconditioning related persistent GI bleed/Anemia. 2. Patient is admitted to receive collaborative, interdisciplinary care between the physiatrist, rehab nursing staff, and therapy team. 3. Patient's level of medical complexity and substantial therapy needs in context of that medical necessity cannot be provided at a lesser intensity of care such as a SNF. 4. Patient has experienced substantial functional loss from his/her baseline which was documented above under the "Functional History" and "Functional Status" headings. Judging by the patient's diagnosis, physical exam, and functional history, the patient has potential for functional progress which will result in measurable gains while on inpatient rehab. These gains will be of substantial and practical use upon discharge in facilitating mobility and self-care at the household level. 5. Physiatrist will provide 24 hour management of medical needs as well as oversight of the therapy plan/treatment and provide guidance as appropriate regarding the interaction of the two. 6. 24 hour rehab nursing will assist with bladder management, bowel management,  safety, skin/wound care, disease management, medication administration, pain management and patient education and help integrate therapy concepts, techniques,education, etc. 7. PT will assess and treat for/with: Lower extremity strength, range of motion, stamina, balance, functional mobility, safety, adaptive techniques and equipment, NMR, activity tolerance, family education. Goals are: mod I.. 8. OT will assess and treat for/with: ADL's, functional mobility, safety, upper extremity strength,  adaptive techniques and equipment, NMR, ego support, family education. Goals are: mod I. Therapy may proceed with showering this patient. 9. SLP will assess and treat for/with: n/a. Goals are: n/a. 10. Case Management and Social Worker will assess and treat for psychological issues and discharge planning. 11. Team conference will be held weekly to assess progress toward goals and to determine barriers to discharge. 12. Patient will receive at least 3 hours of therapy per day at least 5 days per week. 13. ELOS: 7-8 days  14. Prognosis: excellent     [name], MD, [facility] 08/10/2014

## 2014-08-10 NOTE — Progress Notes (Signed)
[name] Rehab Admission Coordinator Signed Physical Medicine and Rehabilitation PMR Pre-admission 08/10/2014 11:14 AM  Related encounter: ED to Hosp-Admission (Current) from 08/03/2014 in [facility]   PMR Admission Coordinator Pre-Admission Assessment  Patient: [name] is an 81 y.o., female MRN: [id number] DOB: 04-09-1922 Height: 5\' 8"  (172.7 cm) Weight: 73.12 kg (161 lb 3.2 oz)  Insurance Information HMO: PPO: PCP: IPA: 80/20: OTHER:  PRIMARY: Medicare A and B Policy#: [id number] d Subscriber: self CM Name: Phone#: Fax#:  Pre-Cert#: Employer: retired Benefits: Phone #: Name:  Eff. Date: Part A 01/02/91 , Part B 12/02/91 Deduct: $7425 Out of Pocket Max: no Life Max: no CIR: 100% SNF: 100% first 20 days Outpatient: 80%/20% Co-Pay:  Home Health: 100% Co-Pay:  DME: 80% Co-Pay: 20% Providers: Pt. choice*  Medicaid Application Date: Case Manager:  Disability Application Date: Case Worker:   Emergency Contact Information Contact Information    Name Relation Home Work Hope Daughter [phone]     Tamma, Brigandi [phone]     Nika, Yazzie [phone]     Yarixa, Lightcap [phone]       Current Medical History  Patient Admitting Diagnosis: severe deconditioning related to GIB, anemia---course complicated by delirium History of Present Illness: [name] is a 81 y.o.right handed female with history of PSVT maintained on aspirin 81 mg daily, upper GI bleed August 2014. Independent with a cane prior to admission living alone. Presented 08/03/2014 with generalized weakness and  feeling tired over the past week. She had a bowel movement which was notable for dark red blood. Patient noted also take daily mobic. Noted heart rate in the 140s to 150s. Hemoglobin 8.8-6.1 down from baseline 12.5. Started on intravenous Protonix. Gastroenterology service was consulted. Underwent EGD 08/04/2014 per Dr. Deatra Ina that showed no fresh blood and exam was otherwise unremarkable. Colonoscopy was not felt to be appropriate giving her advanced age. CT of abdomen and pelvis showed bilateral internal iliac artery aneurysms with considerable mural thrombus. These were only minimally enlarged when compared with prior scan was 2012. She was not a candidate for anticoagulation. Patient was transfused during her hospital course with latest hemoglobin 7.2 08/10/2014 and again plan to transfuse 1 unit packed red blood cells today. She remained on metoprolol for chronic SVT and echocardiogram unremarkable. Bouts of delirium that has improved. Due to QTC prolonged it was advised to avoid any Haldol or Seroquel and to use Ativan if only absolutely necessary. She is tolerating a regular diet. Physical therapy evaluation completed 08/08/2014 with recommendations of physical medicine rehabilitation consult. Patient was admitted for comprehensive rehabilitation program      Past Medical History  Past Medical History  Diagnosis Date  . Hypertension   . Cancer 2011, 2012    left breast cancer 2011 central excision,2012 mastectomy  . Glaucoma   . Arthritis   . Upper GI bleed 01/2013.     Melena. HPylori negative gastritis on EGD.   [name] Complication of anesthesia     shortness of breath after procedure  . SVT (supraventricular tachycardia) 03/2014  . Protein-calorie malnutrition, severe 03/2014    Family History  family history includes Coronary artery disease (age of onset: 32) in her mother.  Prior Rehab/Hospitalizations: no prior rehab per patient  Current  Medications   Current facility-administered medications:  . 0.9 % sodium chloride infusion, , Intravenous, Once, Cherene Altes, MD . 0.9 % sodium chloride infusion, ,  Intravenous, Once, Robbie Lis, MD . acetaminophen (TYLENOL) tablet 500 mg, 500 mg, Oral, Q6H PRN, Domenic Polite, MD . latanoprost (XALATAN) 0.005 % ophthalmic solution 1 drop, 1 drop, Both Eyes, QHS, Domenic Polite, MD, 1 drop at 08/09/14 2210 . metoprolol tartrate (LOPRESSOR) tablet 37.5 mg, 37.5 mg, Oral, BID, Allie Bossier, MD, 37.5 mg at 08/10/14 1056 . MUSCLE RUB CREA, , Topical, PRN, Domenic Polite, MD . ondansetron (ZOFRAN) tablet 4 mg, 4 mg, Oral, Q6H PRN **OR** ondansetron (ZOFRAN) injection 4 mg, 4 mg, Intravenous, Q6H PRN, Domenic Polite, MD . pantoprazole (PROTONIX) EC tablet 40 mg, 40 mg, Oral, BID, Allie Bossier, MD, 40 mg at 08/10/14 1057 . sodium chloride 0.9 % injection 3 mL, 3 mL, Intravenous, Q12H, Domenic Polite, MD, 3 mL at 08/10/14 1058  Patients Current Diet: Diet regular Diet - low sodium heart healthy  Precautions / Restrictions Precautions Precautions: Fall Precaution Comments: reports she fell two days before this admission hittng her head on the couch ("so it didn't hurt much) Restrictions Weight Bearing Restrictions: No   Prior Activity Level Limited Community (1-2x/wk): Pt. and family report pt. goes out about 2x/week for appointments, doing laundry, grocery shopping, out to eat and going to church. 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, international aid / [place] Devices/Equipment: Radio producer (specify quad or straight), Eyeglasses Home Equipment: Cane - single point  Prior Functional Level Prior Function Level of Independence: Independent with assistive device(s) Comments: sometimes uses cane  Current Functional Level Cognition  Overall Cognitive Status: (pt could not figure out how to get the lotion to come out of the bottle) Orientation Level: Oriented to  person, Oriented to place, Disoriented to time, Oriented to situation   Extremity Assessment (includes Sensation/Coordination)  Upper Extremity Assessment: Overall WFL for tasks assessed  Lower Extremity Assessment: Defer to PT evaluation    ADLs  Overall ADL's : Needs assistance/impaired Eating/Feeding: Independent, Sitting Grooming: Wash/dry hands, Wash/dry face, Supervision/safety, Set up (with min A standing at sink) Upper Body Bathing: Supervision/ safety, Set up (with min A standing at sink) Lower Body Bathing: Supervison/ safety, Set up (with min A standing at sink) Upper Body Dressing : Set up, Supervision/safety, Sitting Lower Body Dressing: Supervision/safety, Set up (with Mod A sit<>stand from recliner) Toilet Transfer: Ambulation, Minimal assistance, RW, BSC (over toilet) Toileting- Clothing Manipulation and Hygiene: Minimal assistance, Sit to/from stand    Mobility  General bed mobility comments: Pt sitting up in recliner chair upon PT arrival.     Transfers  Overall transfer level: Needs assistance Equipment used: Straight cane Transfers: Sit to/from Stand Sit to Stand: (Mod A from recliner, min A from 3n1 (higher)) General transfer comment: Mod assist for initial stand from recliner. Min assist for second stand from a straight backed chair with arm rests.     Ambulation / Gait / Stairs / Wheelchair Mobility  Ambulation/Gait Ambulation/Gait assistance: Physicist, medical (Feet): 50 Feet Assistive device: Rolling walker (2 wheeled) Gait Pattern/deviations: Step-through pattern, Decreased stride length, Trunk flexed Gait velocity: Decreased Gait velocity interpretation: Below normal speed for age/gender General Gait Details: Pt was able to ambulate slow and guarded. Appeared to have difficulty maneuvering the walker however when asked pt states she did not have difficulty. VC's for improved posture and to hold the walker closer.      Posture / Balance Balance Overall balance assessment: History of Falls    Special needs/care consideration  Continuous Drip IV no  Oxygen no  Skin Pt. And family  deny any current skin issues  Bowel mgmt: Latest BM 08/10/14, uses BSC Bladder mgmt: Per patient, occasional urinary incontinence Diabetic mgmt no     Previous Home Environment Living Arrangements: Alone Available Help at Discharge: Family, Available PRN/intermittently Type of Home: House Home Layout: One level Home Access: Ramped entrance Entrance Stairs-Number of Steps: 2 steps up in the garage Bathroom Shower/Tub: Chiropodist: Standard Bathroom Accessibility: Yes How Accessible: Accessible via walker [place]: No  Discharge Living Setting Plans for Discharge Living Setting: Patient's home Type of Home at Discharge: House Discharge Home Layout: One level Discharge Home Access: Stairs to enter, Ramped entrance Entrance Stairs-Rails: Left Entrance Stairs-Number of Steps: 3 Discharge Bathroom Shower/Tub: Tub/shower unit Discharge Bathroom Toilet: Standard Discharge Bathroom Accessibility: Yes How Accessible: Accessible via walker Does the patient have any problems obtaining your medications?: No  Social/Family/Support Systems Patient Roles: Parent Anticipated Caregiver: Daughter [name], son Ronalee Belts, granddaughter Louretta Shorten Ability/Limitations of Caregiver: Daughter [name] walks on a cane and says she needs hip surgery; family available intermittently. I have advised family that pt. is anticipated to reach a modified independent to supervision level of function at discharge. [name] states the family has begun talks as to who will be assisting the pt. post DC from rehab Caregiver Availability: Intermittent Discharge Plan Discussed with Primary Caregiver: Yes Is Caregiver In Agreement with Plan?: Yes Does Caregiver/Family have Issues with  Lodging/Transportation while Pt is in Rehab?: No    Goals/Additional Needs Patient/Family Goal for Rehab: modified independent with PT; modified independent to supervision for OT; n/a SLP Expected length of stay: 7 days Cultural Considerations: "Catholic" Equipment Needs: TBD Pt/Family Agrees to Admission and willing to participate: Yes Program Orientation Provided & Reviewed with Pt/Caregiver Including Roles & Responsibilities: Yes Additional Information Needs: Pt's grand daughter Emersynn Deatley states she is medical POA . I spoke with her over the phone on 3/8. She prefers to be involved in conversations about her grandmother.    Decrease burden of Care through IP rehab admission: no   Possible need for SNF placement upon discharge: Not anticipated   Patient Condition: This patient's condition remains as documented in the consult dated 08/09/14, in which the Rehabilitation Physician determined and documented that the patient's condition is appropriate for intensive rehabilitative care in an inpatient rehabilitation facility. Will admit to inpatient rehab today.  Preadmission Screen Completed By: [name], 08/10/2014 11:14 AM ______________________________________________________________________  Discussed status with Dr. Naaman Plummer on 08/10/14 at 1136 and received telephone approval for admission today.  Admission Coordinator: [name], time 1136 Sudie Grumbling 08/10/14          Cosigned by: [name], MD at 08/10/2014 11:48 AM

## 2014-08-10 NOTE — PMR Pre-admission (Signed)
PMR Admission Coordinator Pre-Admission Assessment  Patient: [name] is an 81 y.o., female MRN: [id number] DOB: 08/17/1921 Height: 5\' 8"  (172.7 cm) Weight: 73.12 kg (161 lb 3.2 oz)              Insurance Information HMO:     PPO:      PCP:      IPA:      80/20:      OTHER:  PRIMARY:  Medicare A and B       Policy#: [id number]    Subscriber:  self CM Name:        Phone#:      Fax#:  Pre-Cert#:       Employer: retired Benefits:  Phone #:      Name:  Eff. Date:  Part A 01/02/91 , Part B 12/02/91    Deduct:  $8099      Out of Pocket Max:  no      Life Max:  no CIR:  100%      SNF:  100% first 20 days Outpatient:  80%/20%     Co-Pay:  Home Health:  100%      Co-Pay:  DME:  80%     Co-Pay:  20% Providers:  Pt. choice*  Medicaid Application Date:       Case Manager:  Disability Application Date:       Case Worker:   Emergency Contact Information Contact Information    Name Relation Home Work [place] Daughter [phone]     Nasiya, Pascual [phone]     Shalyn, Koral [phone]     Joei, Frangos [phone]       Current Medical History  Patient Admitting Diagnosis:  severe deconditioning related to GIB, anemia---course complicated by delirium History of Present Illness: [name] is a 81 y.o.right handed female with history of PSVT maintained on aspirin 81 mg daily, upper GI bleed August 2014. Independent with a cane prior to admission living alone. Presented 08/03/2014 with generalized weakness and feeling tired over the past week. She had a bowel movement which was notable for dark red blood. Patient noted also take daily mobic. Noted heart rate in the 140s to 150s. Hemoglobin 8.8-6.1 down from baseline 12.5. Started on intravenous Protonix. Gastroenterology service was consulted. Underwent EGD 08/04/2014 per Dr. Deatra Ina that showed no fresh blood and exam was otherwise unremarkable. Colonoscopy was not felt to be appropriate giving  her advanced age. CT of abdomen and pelvis showed bilateral internal iliac artery aneurysms with considerable mural thrombus. These were only minimally enlarged when compared with prior scan was 2012. She was not a candidate for anticoagulation. Patient was transfused during her hospital course with latest hemoglobin 7.2 08/10/2014 and again plan to transfuse 1 unit packed red blood cells today. She remained on metoprolol for chronic SVT and echocardiogram unremarkable. Bouts of delirium that has improved. Due to QTC prolonged it was advised to avoid any Haldol or Seroquel and to use Ativan if only absolutely necessary. She is tolerating a regular diet. Physical therapy evaluation completed 08/08/2014 with recommendations of physical medicine rehabilitation consult. Patient was admitted for comprehensive rehabilitation program      Past Medical History  Past Medical History  Diagnosis Date  . Hypertension   . Cancer 2011, 2012    left breast cancer 2011 central excision,2012 mastectomy  . Glaucoma   . Arthritis   . Upper GI bleed 01/2013.     Melena. HPylori negative gastritis on  EGD.   . Complication of anesthesia     shortness of breath after procedure  . SVT (supraventricular tachycardia) 03/2014  . Protein-calorie malnutrition, severe 03/2014    Family History  family history includes Coronary artery disease (age of onset: 61) in her mother.  Prior Rehab/Hospitalizations: no prior rehab per patient   Current Medications   Current facility-administered medications:  .  0.9 %  sodium chloride infusion, , Intravenous, Once, Cherene Altes, MD .  0.9 %  sodium chloride infusion, , Intravenous, Once, Robbie Lis, MD .  acetaminophen (TYLENOL) tablet 500 mg, 500 mg, Oral, Q6H PRN, Domenic Polite, MD .  latanoprost (XALATAN) 0.005 % ophthalmic solution 1 drop, 1 drop, Both Eyes, QHS, Domenic Polite, MD, 1 drop at 08/09/14 2210 .  metoprolol tartrate (LOPRESSOR) tablet 37.5 mg, 37.5  mg, Oral, BID, Allie Bossier, MD, 37.5 mg at 08/10/14 1056 .  MUSCLE RUB CREA, , Topical, PRN, Domenic Polite, MD .  ondansetron (ZOFRAN) tablet 4 mg, 4 mg, Oral, Q6H PRN **OR** ondansetron (ZOFRAN) injection 4 mg, 4 mg, Intravenous, Q6H PRN, Domenic Polite, MD .  pantoprazole (PROTONIX) EC tablet 40 mg, 40 mg, Oral, BID, Allie Bossier, MD, 40 mg at 08/10/14 1057 .  sodium chloride 0.9 % injection 3 mL, 3 mL, Intravenous, Q12H, Domenic Polite, MD, 3 mL at 08/10/14 1058  Patients Current Diet: Diet regular Diet - low sodium heart healthy  Precautions / Restrictions Precautions Precautions: Fall Precaution Comments: reports she fell two days before this admission hittng her head on the couch ("so it didn't hurt much) Restrictions Weight Bearing Restrictions: No   Prior Activity Level Limited Community (1-2x/wk): Pt. and family report pt. goes out about 2x/week for appointments, doing laundry, grocery shopping, out to eat and going to church.  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, international aid / [place] Devices/Equipment: Radio producer (specify quad or straight), Eyeglasses Home Equipment: Cane - single point  Prior Functional Level Prior Function Level of Independence: Independent with assistive device(s) Comments: sometimes uses cane  Current Functional Level Cognition  Overall Cognitive Status:  (pt could not figure out how to get the lotion to come out of the bottle) Orientation Level: Oriented to person, Oriented to place, Disoriented to time, Oriented to situation    Extremity Assessment (includes Sensation/Coordination)  Upper Extremity Assessment: Overall WFL for tasks assessed  Lower Extremity Assessment: Defer to PT evaluation    ADLs  Overall ADL's : Needs assistance/impaired Eating/Feeding: Independent, Sitting Grooming: Wash/dry hands, Wash/dry face, Supervision/safety, Set up (with min A standing at sink) Upper Body Bathing: Supervision/ safety, Set up  (with min A standing at sink) Lower Body Bathing: Supervison/ safety, Set up (with min A standing at sink) Upper Body Dressing : Set up, Supervision/safety, Sitting Lower Body Dressing: Supervision/safety, Set up (with Mod A sit<>stand from recliner) Toilet Transfer: Ambulation, Minimal assistance, RW, BSC (over toilet) Toileting- Clothing Manipulation and Hygiene: Minimal assistance, Sit to/from stand    Mobility  General bed mobility comments: Pt sitting up in recliner chair upon PT arrival.     Transfers  Overall transfer level: Needs assistance Equipment used: Straight cane Transfers: Sit to/from Stand Sit to Stand:  (Mod A from recliner, min A from 3n1 (higher)) General transfer comment: Mod assist for initial stand from recliner. Min assist for second stand from a straight backed chair with arm rests.     Ambulation / Gait / Stairs / Wheelchair Mobility  Ambulation/Gait Ambulation/Gait assistance: Physicist, medical (  Feet): 50 Feet Assistive device: Rolling walker (2 wheeled) Gait Pattern/deviations: Step-through pattern, Decreased stride length, Trunk flexed Gait velocity: Decreased Gait velocity interpretation: Below normal speed for age/gender General Gait Details: Pt was able to ambulate slow and guarded. Appeared to have difficulty maneuvering the walker however when asked pt states she did not have difficulty. VC's for improved posture and to hold the walker closer.     Posture / Balance Balance Overall balance assessment: History of Falls    Special needs/care consideration  Continuous Drip IV  no         Oxygen  no       Skin   Pt. And family deny any current skin issues                               Bowel mgmt:  Latest BM 08/10/14, uses BSC Bladder mgmt:  Per patient, occasional urinary incontinence Diabetic mgmt   no     Previous Home Environment Living Arrangements: Alone Available Help at Discharge: Family, Available PRN/intermittently Type of  Home: House Home Layout: One level Home Access: Ramped entrance Entrance Stairs-Number of Steps: 2 steps up in the garage Bathroom Shower/Tub: Chiropodist: Standard Bathroom Accessibility: Yes How Accessible: Accessible via walker Rossie: No  Discharge Living Setting Plans for Discharge Living Setting: Patient's home Type of Home at Discharge: House Discharge Home Layout: One level Discharge Home Access: Stairs to enter, Ramped entrance Entrance Stairs-Rails: Left Entrance Stairs-Number of Steps: 3 Discharge Bathroom Shower/Tub: Tub/shower unit Discharge Bathroom Toilet: Standard Discharge Bathroom Accessibility: Yes How Accessible: Accessible via walker Does the patient have any problems obtaining your medications?: No  Social/Family/Support Systems Patient Roles: Parent Anticipated Caregiver: Daughter [name], son Ronalee Belts, granddaughter Louretta Shorten Ability/Limitations of Caregiver: Daughter [name] walks on a cane and says she needs hip surgery; family available intermittently.  I have advised family that pt. is anticipated to reach a modified independent to supervision level of function at discharge.  [name] states the family has begun talks as to who will be assisting the pt.  post DC from rehab Caregiver Availability: Intermittent Discharge Plan Discussed with Primary Caregiver: Yes Is Caregiver In Agreement with Plan?: Yes Does Caregiver/Family have Issues with Lodging/Transportation while Pt is in Rehab?: No    Goals/Additional Needs Patient/Family Goal for Rehab: modified independent with PT; modified independent to supervision for OT; n/a SLP Expected length of stay: 7 days Cultural Considerations: "Catholic" Equipment Needs: TBD Pt/Family Agrees to Admission and willing to participate: Yes Program Orientation Provided & Reviewed with Pt/Caregiver Including Roles  & Responsibilities: Yes Additional Information Needs: Pt's grand daughter Wynetta Seith  states she is medical POA  . I spoke with her over the phone on 3/8.  She prefers to be involved in conversations about her grandmother.     Decrease burden of Care through IP rehab admission:   no   Possible need for SNF placement upon discharge:  Not anticipated   Patient Condition: This patient's condition remains as documented in the consult dated 08/09/14, in which the Rehabilitation Physician determined and documented that the patient's condition is appropriate for intensive rehabilitative care in an inpatient rehabilitation facility. Will admit to inpatient rehab today.  Preadmission Screen Completed By:  Gerlean Ren, 08/10/2014 11:14 AM ______________________________________________________________________   Discussed status with Dr.  Naaman Plummer on 08/10/14 at  1136  and received telephone approval for admission today.  Admission Coordinator:  Wayne Both,  Satomi Buda, time 1136 /Date 08/10/14

## 2014-08-10 NOTE — Progress Notes (Signed)
Physical Therapy Treatment [name] MRN: [id number] DOB: 1921/11/04 Today's Date: 08/10/2014    History of Present Illness Pt presents with blood in stool. past medical history of PSVT, history of upper GI bleed, hypertension, DJD     PT Comments    Pt admitted with above diagnosis. Pt currently with functional limitations due to balance and endurance deficits. Pt making progress but definitely will benefit from Rehab to gain strength and repetition for becoming independent for home.   Pt will benefit from skilled PT to increase their independence and safety with mobility to allow discharge to the venue listed below.    Follow Up Recommendations  CIR     Equipment Recommendations  Rolling walker with 5" wheels;Other (comment) (Tub bench, hand held shower head)    Recommendations for Other Services Rehab consult     Precautions / Restrictions Precautions Precautions: Fall Restrictions Weight Bearing Restrictions: No    Mobility  Bed Mobility Overal bed mobility: Needs Assistance Bed Mobility: Supine to Sit     Supine to sit: Min assist;Mod assist     General bed mobility comments: Incr time to get to EOB with definite need of rail.   Transfers Overall transfer level: Needs assistance Equipment used: Straight cane Transfers: Sit to/from Stand Sit to Stand: Min assist;Mod assist         General transfer comment: Mod assist for initial stand from bed. Min assist once pt stood for a minute as she was still unsteady with widened BOS.   Ambulation/Gait Ambulation/Gait assistance: Mod assist;Min assist Ambulation Distance (Feet): 80 Feet Assistive device: Straight cane Gait Pattern/deviations: Step-through pattern;Decreased step length - right;Decreased step length - left;Staggering right;Staggering left;Drifts right/left;Wide base of support;Antalgic Gait velocity: Decreased Gait velocity interpretation: Below normal speed for  age/gender General Gait Details: Pt wanted to try the cane.  Pt with poor stability with cane needing assist for LOB with several LOB needing min to mod assist.  Places cane out to right with widened stance.  Somewhat flexed posture as well.  Encouraged pt to get used to RW as cane is not the safest option at this point and pt agrees to try.     Stairs            Wheelchair Mobility    Modified Rankin (Stroke Patients Only)       Balance Overall balance assessment: History of Falls;Needs assistance         Standing balance support: Single extremity supported;During functional activity Standing balance-Leahy Scale: Poor Standing balance comment: Requires bil UE support for balance in standing.              High level balance activites: Direction changes;Turns;Sudden stops High Level Balance Comments: mod to miin assist with cane.      Cognition Arousal/Alertness: Awake/alert Behavior During Therapy: WFL for tasks assessed/performed Overall Cognitive Status: Within Functional Limits for tasks assessed                      Exercises General Exercises - Upper Extremity Shoulder Flexion: AROM;Both;10 reps;Seated General Exercises - Lower Extremity Ankle Circles/Pumps: AROM;Both;10 reps;Seated Long Arc Quad: AROM;Both;10 reps;Seated Hip Flexion/Marching: AROM;Both;10 reps;Seated    General Comments        Pertinent Vitals/Pain Pain Assessment: No/denies pain  VSS    Home Living                      Prior Function  PT Goals (current goals can now be found in the care plan section) Progress towards PT goals: Progressing toward goals    Frequency  Min 3X/week    PT Plan Current plan remains appropriate    Co-evaluation             End of Session Equipment Utilized During Treatment: Gait belt Activity Tolerance: Patient tolerated treatment well Patient left: in chair;with call bell/phone within reach;with  family/visitor present;with chair alarm set     Time: 1610-9604 PT Time Calculation (min) (ACUTE ONLY): 23 min  Charges:  $Gait Training: 8-22 mins $Therapeutic Exercise: 8-22 mins                    G CodesIrwin Brakeman F 08-17-14, 11:57 AM [facility] [phone] (pager)

## 2014-08-10 NOTE — Progress Notes (Signed)
[name], MD Physician Signed Physical Medicine and Rehabilitation Consult Note 08/09/2014 11:37 AM  Related encounter: ED to Hosp-Admission (Current) from 08/03/2014 in [place] Collapse All        Physical Medicine and Rehabilitation Consult Reason for Consult: Debilitation/upper GI bleed  Referring Physician: [facility]   HPI: [name] is a 81 y.o.right handed female with history of PSVT maintained on aspirin 81 mg daily, upper GI bleed August 2014. Independent with a cane prior to admission living alone. Presented 08/03/2014 with generalized weakness and feeling tired over the past week. She had a bowel movement which was notable for dark red blood. Patient noted also take daily mobic. Noted heart rate in the 140s to 150s. Hemoglobin 8.8-6.1 down from baseline 12.5. Started on intravenous Protonix. Gastroenterology service was consulted. Underwent EGD 08/04/2014 per Dr. Deatra Ina that showed no fresh blood and exam was otherwise unremarkable. Colonoscopy was not felt to be appropriate giving her advanced age. CT of abdomen and pelvis showed bilateral internal iliac artery aneurysms with considerable mural thrombus. These were only minimally enlarged when compared with prior scan was 2012. She was not a candidate for anticoagulation. Patient was transfused with latest hemoglobin 7.8. She remained on metoprolol for chronic SVT and echocardiogram unremarkable. Bouts of delirium that has improved. Due to QTC prolonged it was advised to avoid any Haldol or Seroquel and to use Ativan if only absolutely necessary. She is tolerating a regular diet. Physical therapy evaluation completed 08/08/2014 with recommendations of physical medicine rehabilitation consult. Patient was admitted for comprehensive rehabilitation program   Review of Systems  Cardiovascular: Positive for palpitations.  Gastrointestinal: Positive for constipation.  Musculoskeletal:  Positive for myalgias and joint pain.  All other systems reviewed and are negative.  Past Medical History  Diagnosis Date  . Hypertension   . Cancer 2011, 2012    left breast cancer 2011 central excision,2012 mastectomy  . Glaucoma   . Arthritis   . Upper GI bleed 01/2013.     Melena. HPylori negative gastritis on EGD.   [name] Complication of anesthesia     shortness of breath after procedure  . SVT (supraventricular tachycardia) 03/2014  . Protein-calorie malnutrition, severe 03/2014   Past Surgical History  Procedure Laterality Date  . Cholecystectomy    . Breast surgery  2011  . Breast surgery  2012    lt breast masty  . Abdominal hysterectomy    . Esophagogastroduodenoscopy N/A 01/25/2013    Procedure: ESOPHAGOGASTRODUODENOSCOPY (EGD); Surgeon: Jerene Bears, MD; Location: [facility]; Service: Gastroenterology; Laterality: N/A;  . Esophagogastroduodenoscopy (egd) with propofol  08/04/2014    Procedure: ESOPHAGOGASTRODUODENOSCOPY (EGD) WITH PROPOFOL; Surgeon: Inda Castle, MD; Location: [facility] ENDOSCOPY; Service: Endoscopy;;   Family History  Problem Relation Age of Onset  . Coronary artery disease Mother 55    MI   Social History:  reports that she has never smoked. She has never used smokeless tobacco. She reports that she does not drink alcohol or use illicit drugs. Allergies:  Allergies  Allergen Reactions  . Codeine     REACTION: causes nausea and vomiting   Medications Prior to Admission  Medication Sig Dispense Refill  . acetaminophen (TYLENOL) 325 MG tablet Take 325 mg by mouth every 6 (six) hours as needed for pain.    [name] aspirin EC 81 MG EC tablet Take 1 tablet (81 mg total) by mouth daily.    . diclofenac sodium (VOLTAREN)  1 % GEL Apply 2 g topically 4 (four) times daily. 100 g 2  . feeding supplement, RESOURCE BREEZE, (RESOURCE BREEZE) LIQD  Take 1 Container by mouth 3 (three) times daily between meals. 240 mL 12  . lisinopril (PRINIVIL,ZESTRIL) 40 MG tablet Take 1 tablet (40 mg total) by mouth daily. 30 tablet 11  . metoprolol tartrate (LOPRESSOR) 25 MG tablet Take 1 tablet (25 mg total) by mouth 2 (two) times daily. 60 tablet 6  . pantoprazole (PROTONIX) 40 MG tablet Take 1 tablet (40 mg total) by mouth daily. 30 tablet 11  . travoprost, benzalkonium, (TRAVATAN) 0.004 % ophthalmic solution Place 1 drop into both eyes at bedtime.    . trolamine salicylate (ASPERCREME) 10 % cream Apply 1 application topically as needed for muscle pain (knees, back).      Home: Home Living Family/patient expects to be discharged to:: Private residence Living Arrangements: Alone Available Help at Discharge: Family, Available PRN/intermittently Type of Home: House Home Access: Ramped entrance [place] of Steps: 2 steps up in the garage [place]: One [place]: [place] - single point  Functional History: Prior Function Level of Independence: Independent with assistive device(s) Comments: sometimes uses cane Functional Status:  Mobility: Bed Mobility General bed mobility comments: Pt sitting up in recliner chair upon PT arrival.  Transfers Overall transfer level: Needs assistance Equipment used: Straight cane Transfers: Sit to/from Stand Sit to Stand: (Mod A from recliner, min A from 3n1 (higher)) General transfer comment: Mod assist for initial stand from recliner. Min assist for second stand from a straight backed chair with arm rests.  Ambulation/Gait Ambulation/Gait assistance: Min guard Ambulation Distance (Feet): 50 Feet Assistive device: Rolling walker (2 wheeled) Gait Pattern/deviations: Step-through pattern, Decreased stride length, Trunk flexed Gait velocity: Decreased Gait velocity interpretation: Below normal speed for age/gender General Gait Details: Pt was able to  ambulate slow and guarded. Appeared to have difficulty maneuvering the walker however when asked pt states she did not have difficulty. VC's for improved posture and to hold the walker closer.     ADL: ADL Overall ADL's : Needs assistance/impaired Eating/Feeding: Independent, Sitting Grooming: Wash/dry hands, Wash/dry face, Supervision/safety, Set up (with min A standing at sink) Upper Body Bathing: Supervision/ safety, Set up (with min A standing at sink) Lower Body Bathing: Supervison/ safety, Set up (with min A standing at sink) Upper Body Dressing : Set up, Supervision/safety, Sitting Lower Body Dressing: Supervision/safety, Set up (with Mod A sit<>stand from recliner) Toilet Transfer: Ambulation, Minimal assistance, RW, BSC (over toilet) Toileting- Clothing Manipulation and Hygiene: Minimal assistance, Sit to/from stand  Cognition: Cognition Overall Cognitive Status: (pt could not figure out how to get the lotion to come out of the bottle) Orientation Level: Oriented to person, Oriented to place, Disoriented to time, Oriented to situation Cognition Arousal/Alertness: Awake/alert Behavior During Therapy: WFL for tasks assessed/performed Overall Cognitive Status: (pt could not figure out how to get the lotion to come out of the bottle)  Blood pressure 144/60, pulse 67, temperature 98.4 F (36.9 C), temperature source Oral, resp. rate 22, height 5\' 8"  (1.727 m), weight 72.576 kg (160 lb), SpO2 100 %. Physical Exam  Constitutional: She appears well-developed.  HENT:  Poor dentition  Eyes: EOM are normal.  Neck: Normal range of motion. Neck supple. No thyromegaly present.  Cardiovascular:  Cardiac rate controlled  Respiratory: Effort normal and breath sounds normal. No respiratory distress.  GI: Soft. Bowel sounds are normal. She exhibits no distension.  Neurological: She is  alert.  Patient is able to provide her name, age, date of birth. She is able to provide place and month.  Follows simple commands. UE strength 4/5 delt, bic, tricep, hands, LE: 2+ right HF, 3 left HF, 3/5 KE and 4/5 ADF/APF. No sensory findings. Cognitively appropriate  Skin: Skin is warm and dry.  Psychiatric: She has a normal mood and affect. Her behavior is normal.     Lab Results Last 24 Hours    Results for orders placed or performed during the hospital encounter of 08/03/14 (from the past 24 hour(s))  Prepare RBC Status: None   Collection Time: 08/08/14 5:51 PM  Result Value Ref Range   Order Confirmation ORDER PROCESSED BY BLOOD BANK   CBC Status: Abnormal   Collection Time: 08/09/14 5:40 AM  Result Value Ref Range   WBC 9.4 4.0 - 10.5 K/uL   RBC 2.78 (L) 3.87 - 5.11 MIL/uL   Hemoglobin 7.8 (L) 12.0 - 15.0 g/dL   HCT 23.2 (L) 36.0 - 46.0 %   MCV 83.5 78.0 - 100.0 fL   MCH 28.1 26.0 - 34.0 pg   MCHC 33.6 30.0 - 36.0 g/dL   RDW 17.6 (H) 11.5 - 15.5 %   Platelets 243 150 - 400 K/uL      Imaging Results (Last 48 hours)    No results found.    Assessment/Plan: Diagnosis: severe deconditioning related to GIB, anemia---course complicated by delirium 1. Does the need for close, 24 hr/day medical supervision in concert with the patient's rehab needs make it unreasonable for this patient to be served in a less intensive setting? Yes 2. Co-Morbidities requiring supervision/potential complications: GERD, lumbar spondylosis, bradycardia, PSVT, malnutrition 3. Due to bladder management, bowel management, safety, skin/wound care, disease management, medication administration, pain management and patient education, does the patient require 24 hr/day rehab nursing? Yes 4. Does the patient require coordinated care of a physician, rehab nurse, PT (1-2 hrs/day, 5 days/week) and OT (1-2 hrs/day, 5 days/week) to address physical and functional deficits in the context of the above medical diagnosis(es)? Yes Addressing deficits in the  following areas: balance, endurance, locomotion, strength, transferring, bowel/bladder control, bathing, dressing, feeding, grooming and toileting 5. Can the patient actively participate in an intensive therapy program of at least 3 hrs of therapy per day at least 5 days per week? Yes 6. The potential for patient to make measurable gains while on inpatient rehab is excellent 7. Anticipated functional outcomes upon discharge from inpatient rehab are modified independent with PT, modified independent and supervision with OT, n/a with SLP. 8. Estimated rehab length of stay to reach the above functional goals is: 7 days 9. Does the patient have adequate social supports and living environment to accommodate these discharge functional goals? Yes 10. Anticipated D/C setting: Home 11. Anticipated post D/C treatments: HH therapy and Outpatient therapy 12. Overall Rehab/Functional Prognosis: excellent  RECOMMENDATIONS: This patient's condition is appropriate for continued rehabilitative care in the following setting: CIR Patient has agreed to participate in recommended program. Yes Note that insurance prior authorization may be required for reimbursement for recommended care.  Comment: Rehab Admissions Coordinator to follow up.  Thanks,  [name], MD, Mellody Drown     08/09/2014

## 2014-08-11 ENCOUNTER — Inpatient Hospital Stay (HOSPITAL_COMMUNITY): Payer: Medicare Other

## 2014-08-11 ENCOUNTER — Inpatient Hospital Stay (HOSPITAL_COMMUNITY): Payer: Medicare Other | Admitting: Occupational Therapy

## 2014-08-11 LAB — TYPE AND SCREEN
ABO/RH(D): B POS
ANTIBODY SCREEN: NEGATIVE
Unit division: 0

## 2014-08-11 NOTE — Care Management Note (Signed)
[facility] Individual Statement of Services  Patient Name:  [name]  Date:  08/11/2014  Welcome to the [facility].  Our goal is to provide you with an individualized program based on your diagnosis and situation, designed to meet your specific needs.  With this comprehensive rehabilitation program, you will be expected to participate in at least 3 hours of rehabilitation therapies Monday-Friday, with modified therapy programming on the weekends.  Your rehabilitation program will include the following services:  Physical Therapy (PT), Occupational Therapy (OT), 24 hour per day rehabilitation nursing, Case Management (Social Worker), Rehabilitation Medicine, Nutrition Services and Pharmacy Services  Weekly team conferences will be held on Wednesday to discuss your progress.  Your Social Worker will talk with you frequently to get your input and to update you on team discussions.  Team conferences with you and your family in attendance may also be held.  Expected length of stay: 7-10 days  Overall anticipated outcome: mod/i-supervision level  Depending on your progress and recovery, your program may change. Your Social Worker will coordinate services and will keep you informed of any changes. Your Social Worker's name and contact numbers are listed  below.  The following services may also be recommended but are not provided by the [facility]:    [place] will be made to provide these services after discharge if needed.  Arrangements include referral to agencies that provide these services.  Your insurance has been verified to be:  Medicare Your primary doctor is:  Dr. Doug Sou  Pertinent information will be shared with your doctor and your insurance company.  Social Worker:  Ovidio Kin, [place] or ([phone]  Information  discussed with and copy given to patient by: [name], 08/11/2014, 9:20 AM

## 2014-08-11 NOTE — Progress Notes (Signed)
Occupational Therapy Assessment and Plan  Patient Details  Name: [name] MRN: [id number] Date of Birth: [birth year]  OT Diagnosis: muscle weakness (generalized) Rehab Potential: Rehab Potential (ACUTE ONLY): Good ELOS: 7-10 days   Today's Date: 08/11/2014 OT Individual Time: 0900-1000 OT Individual Time Calculation (min): 60 min     Problem List:  Patient Active Problem List   Diagnosis Date Noted  . Debilitated 08/10/2014  . Physical deconditioning   . Hyperkalemia   . Acute blood loss anemia 08/04/2014  . Bleeding gastrointestinal   . Rectal bleeding 08/03/2014  . GI bleed 08/03/2014  . Melena 08/03/2014  . Protein-calorie malnutrition, severe 02/24/2014  . PSVT  02/23/2014  . History of GI bleed- Aug 2014 (NSAIDs) 02/23/2014  . SVT (supraventricular tachycardia) 02/23/2014  . DJD (degenerative joint disease) 04/07/2013  . Breast cancer- s/p mastectomy 03/11/2013  . GERD 03/10/2008  . GLAUCOMA 09/30/2007  . Essential hypertension 09/30/2007  . DEGENERATIVE JOINT DISEASE, LUMBAR SPINE 09/30/2007    Past Medical History:  Past Medical History  Diagnosis Date  . Hypertension   . Cancer 2011, 2012    left breast cancer 2011 central excision,2012 mastectomy  . Glaucoma   . Arthritis   . Upper GI bleed 01/2013.     Melena. HPylori negative gastritis on EGD.   [name] Complication of anesthesia     shortness of breath after procedure  . SVT (supraventricular tachycardia) 03/2014  . Protein-calorie malnutrition, severe 03/2014   Past Surgical History:  Past Surgical History  Procedure Laterality Date  . Cholecystectomy    . Breast surgery  2011  . Breast surgery  2012    lt breast masty  . Abdominal hysterectomy    . Esophagogastroduodenoscopy N/A 01/25/2013    Procedure: ESOPHAGOGASTRODUODENOSCOPY (EGD);  Surgeon: Jerene Bears, MD;  Location: [facility];  Service: Gastroenterology;  Laterality: N/A;  . Esophagogastroduodenoscopy (egd) with propofol  08/04/2014     Procedure: ESOPHAGOGASTRODUODENOSCOPY (EGD) WITH PROPOFOL;  Surgeon: Inda Castle, MD;  Location: [facility];  Service: Endoscopy;;    Assessment & Plan Clinical Impression: Patient is a 81 y.o. year old female with history of PSVT maintained on aspirin 81 mg daily, upper GI bleed August 2014. Independent with a cane prior to admission living alone. Presented 08/03/2014 with generalized weakness and feeling tired over the past week. She had a bowel movement which was notable for dark red blood. Patient noted also take daily mobic. Noted heart rate in the 140s to 150s. Hemoglobin 8.8-6.1 down from baseline 12.5. Started on intravenous Protonix. Gastroenterology service was consulted. Underwent EGD 08/04/2014 per Dr. Deatra Ina that showed no fresh blood and exam was otherwise unremarkable. Colonoscopy was not felt to be appropriate giving her advanced age. CT of abdomen and pelvis showed bilateral internal iliac artery aneurysms with considerable mural thrombus. These were only minimally enlarged when compared with prior scan was 2012. She was not a candidate for anticoagulation. Patient was transfused during her hospital course with hemoglobin 7.2 this morning with 1 unit packed red blood cells today which brought hgb to 8.3. She remained on metoprolol for chronic SVT and echocardiogram unremarkable. Bouts of delirium that has improved. Due to QTC prolonged it was advised to avoid any Haldol or Seroquel and to use Ativan if only absolutely necessary. She is tolerating a regular diet. Physical therapy evaluation completed 08/08/2014 with recommendations of physical medicine rehabilitation consult. Patient was admitted for comprehensive rehabilitation program .  Patient transferred to CIR on 08/10/2014 .  Patient currently requires mod with basic self-care skills secondary to muscle weakness, decreased cardiorespiratoy endurance and decreased sitting balance, decreased standing balance and decreased balance  strategies.  Prior to hospitalization, patient could complete ADLs  with modified independent .  Patient will benefit from skilled intervention to increase independence with basic self-care skills prior to discharge home with care partner.  Anticipate patient will require intermittent supervision and follow up home health.  OT - End of Session Activity Tolerance: Decreased this session Endurance Deficit: Yes Endurance Deficit Description: decreased endurance with standing and functional tasks requiring increased rest breaks OT Assessment Rehab Potential (ACUTE ONLY): Good Barriers to Discharge:  (none known at this time) OT Patient demonstrates impairments in the following area(s): Balance;Endurance;Motor;Safety OT Basic ADL's Functional Problem(s): Grooming;Bathing;Dressing;Toileting OT Advanced ADL's Functional Problem(s): Simple Meal Preparation OT Transfers Functional Problem(s): Toilet;Tub/Shower OT Additional Impairment(s): None OT Plan OT Intensity: Minimum of 1-2 x/day, 45 to 90 minutes OT Frequency: 5 out of 7 days OT Duration/Estimated Length of Stay: 7-10 days OT Treatment/Interventions: Balance/vestibular training;Discharge planning;Self Care/advanced ADL retraining;Therapeutic Activities;UE/LE Coordination activities;Functional mobility training;Patient/family education;Therapeutic Exercise;Community reintegration;DME/adaptive equipment instruction;Neuromuscular re-education;UE/LE Strength taining/ROM OT Self Feeding Anticipated Outcome(s): n/a OT Basic Self-Care Anticipated Outcome(s): Mod I - supervision OT Toileting Anticipated Outcome(s): Mod I OT Bathroom Transfers Anticipated Outcome(s): Mod I - toilet and supervision for shower OT Recommendation Recommendations for Other Services: Other (comment) (none) Patient destination: Home Follow Up Recommendations: 24 hour supervision/assistance;Home health OT Equipment Recommended: Tub/shower bench;3 in 1 bedside  comode   Skilled Therapeutic Intervention Pt transitioned easily from PT evaluation with no c/o pain. OT educated pt on OT purpose, POC, and goals. Pt requesting bathing at sink this session. Pt requesting bathing at sink side this session. She reports daily bathing in sink at home or family members assisted into/out of tub prior to admission. Pt requiring Min A for STS and balance this session. Increased rest breaks during tasks secondary to decreased endurance. Pt ambulating with RW and min A to bathroom with Min A toilet transfer. Steady assist during clothing management and hygiene. Pt returned to sit on recliner chair with call bell within reach.    OT Evaluation Precautions/Restrictions  Precautions Precautions: Fall Restrictions Weight Bearing Restrictions: No Pain Pain Assessment Pain Assessment: No/denies pain Home Living/Prior Functioning Home Living Family/patient expects to be discharged to:: Private residence Living Arrangements: Alone Available Help at Discharge: Family, Available PRN/intermittently Type of Home: House Home Access: Ramped entrance Entrance Stairs-Number of Steps: 2 steps up in the garage Home Layout: One level  Lives With: Alone Prior Function Level of Independence: Independent with homemaking with ambulation, Requires assistive device for independence  Able to Take Stairs?: Yes Driving: No Comments: uses SPC Vision/Perception  Vision- History Baseline Vision/History: Wears glasses (has glaucoma) Wears Glasses: At all times Patient Visual Report: No change from baseline Vision- Assessment Vision Assessment?: No apparent visual deficits  Cognition Overall Cognitive Status: Within Functional Limits for tasks assessed Arousal/Alertness: Awake/alert Orientation Level: Oriented to person;Oriented to place;Oriented to situation;Disoriented to time Safety/Judgment: Appears intact Comments: Pt reporting that if she needed to go to bathroom she would  "press the red bottom" and pointed to RN call bell. Sensation Sensation Light Touch: Appears Intact Stereognosis: Not tested Hot/Cold: Appears Intact Proprioception: Appears Intact Coordination Gross Motor Movements are Fluid and Coordinated: Yes Fine Motor Movements are Fluid and Coordinated: Yes Motor  Motor Motor: Within Functional Limits Motor - Skilled Clinical Observations: generalized weakness Mobility  Bed Mobility Bed Mobility: Supine to  Sit Supine to Sit: 5: Supervision  Trunk/Postural Assessment  Cervical Assessment Cervical Assessment: Exceptions to [facility] (rounded shoulders and forward head) Thoracic Assessment Thoracic Assessment: Exceptions to [facility] (kyphotic) Lumbar Assessment Lumbar Assessment: Exceptions to [facility] (posterior pelvic tilt)  Balance Balance Balance Assessed: Yes Dynamic Sitting Balance Dynamic Sitting - Balance Support: No upper extremity supported Dynamic Sitting - Level of Assistance: 5: Stand by assistance Extremity/Trunk Assessment RUE Assessment RUE Assessment: Within Functional Limits (4/5 throughout) LUE Assessment LUE Assessment: Within Functional Limits (4/5 throughout)  FIM:  FIM - Grooming Grooming Steps: Wash, rinse, dry face;Wash, rinse, dry hands;Oral care, brush teeth, clean dentures Grooming: 4: Steadying assist  or patient completes 3 of 4 or 4 of 5 steps FIM - Bathing Bathing Steps Patient Completed: Chest;Right Arm;Left Arm;Abdomen;Front perineal area;Buttocks;Right upper leg;Left upper leg Bathing: 4: Min-Patient completes 8-9 70f10 parts or 75+ percent FIM - Upper Body Dressing/Undressing Upper body dressing/undressing steps patient completed: Thread/unthread right sleeve of front closure shirt/dress;Thread/unthread left sleeve of front closure shirt/dress;Pull shirt around back of front closure shirt/dress;Button/unbutton shirt Upper body dressing/undressing: 5: Set-up assist to: Obtain clothing/put away FIM - Lower Body  Dressing/Undressing Lower body dressing/undressing steps patient completed: Thread/unthread right pants leg;Thread/unthread left pants leg;Pull pants up/down;Don/Doff left shoe;Fasten/unfasten right shoe;Don/Doff right shoe;Fasten/unfasten left shoe Lower body dressing/undressing: 4: Min-Patient completed 75 plus % of tasks FIM - Toileting Toileting steps completed by patient: Adjust clothing prior to toileting;Performs perineal hygiene;Adjust clothing after toileting Toileting Assistive Devices: Grab bar or rail for support Toileting: 4: Steadying assist FIM - BControl and instrumentation engineerDevices: CBest boy 5: Supine > Sit: Supervision (verbal cues/safety issues);4: Bed > Chair or W/C: Min A (steadying Pt. > 75%);4: Chair or W/C > Bed: Min A (steadying Pt. > 75%) FIM - TRadio producerDevices: Elevated toilet seat;Grab bars;Walker Toilet Transfers: 4-To toilet/BSC: Min A (steadying Pt. > 75%);3-From toilet/BSC: Mod A (lift or lower assist)   Refer to Care Plan for Long Term Goals  Recommendations for other services: None  Discharge Criteria: Patient will be discharged from OT if patient refuses treatment 3 consecutive times without medical reason, if treatment goals not met, if there is a change in medical status, if patient makes no progress towards goals or if patient is discharged from hospital.  The above assessment, treatment plan, treatment alternatives and goals were discussed and mutually agreed upon: by patient  PPhineas Semen3/03/2015, 11:25 AM

## 2014-08-11 NOTE — Evaluation (Signed)
Physical Therapy Assessment and Plan  Patient Details  Name: [name] MRN: [id number] Date of Birth: 1922/01/19  PT Diagnosis: Difficulty walking Rehab Potential: Excellent ELOS: 7 to 10 days   Today's Date: 08/11/2014 PT Individual Time: 0800-0900 PT Individual Time Calculation (min): 60 min    Problem List:  Patient Active Problem List   Diagnosis Date Noted  . Debilitated 08/10/2014  . Physical deconditioning   . Hyperkalemia   . Acute blood loss anemia 08/04/2014  . Bleeding gastrointestinal   . Rectal bleeding 08/03/2014  . GI bleed 08/03/2014  . Melena 08/03/2014  . Protein-calorie malnutrition, severe 02/24/2014  . PSVT  02/23/2014  . History of GI bleed- Aug 2014 (NSAIDs) 02/23/2014  . SVT (supraventricular tachycardia) 02/23/2014  . DJD (degenerative joint disease) 04/07/2013  . Breast cancer- s/p mastectomy 03/11/2013  . GERD 03/10/2008  . GLAUCOMA 09/30/2007  . Essential hypertension 09/30/2007  . DEGENERATIVE JOINT DISEASE, LUMBAR SPINE 09/30/2007    Past Medical History:  Past Medical History  Diagnosis Date  . Hypertension   . Cancer 2011, 2012    left breast cancer 2011 central excision,2012 mastectomy  . Glaucoma   . Arthritis   . Upper GI bleed 01/2013.     Melena. HPylori negative gastritis on EGD.   [name] Complication of anesthesia     shortness of breath after procedure  . SVT (supraventricular tachycardia) 03/2014  . Protein-calorie malnutrition, severe 03/2014   Past Surgical History:  Past Surgical History  Procedure Laterality Date  . Cholecystectomy    . Breast surgery  2011  . Breast surgery  2012    lt breast masty  . Abdominal hysterectomy    . Esophagogastroduodenoscopy N/A 01/25/2013    Procedure: ESOPHAGOGASTRODUODENOSCOPY (EGD);  Surgeon: Beverley Fiedler, MD;  Location: [facility] ENDOSCOPY;  Service: Gastroenterology;  Laterality: N/A;  . Esophagogastroduodenoscopy (egd) with propofol  08/04/2014    Procedure:  ESOPHAGOGASTRODUODENOSCOPY (EGD) WITH PROPOFOL;  Surgeon: Louis Meckel, MD;  Location: [facility] ENDOSCOPY;  Service: Endoscopy;;    Assessment & Plan Clinical Impression: [name] is a 81 y.o.right handed female with history of PSVT maintained on aspirin 81 mg daily, upper GI bleed August 2014. Independent with a cane prior to admission living alone. Presented 08/03/2014 with generalized weakness and feeling tired over the past week. She had a bowel movement which was notable for dark red blood. Patient noted also take daily mobic. Noted heart rate in the 140s to 150s. Hemoglobin 8.8-6.1 down from baseline 12.5. Started on intravenous Protonix. Gastroenterology service was consulted. Underwent EGD 08/04/2014 per Dr. Arlyce Dice that showed no fresh blood and exam was otherwise unremarkable. Colonoscopy was not felt to be appropriate giving her advanced age. CT of abdomen and pelvis showed bilateral internal iliac artery aneurysms with considerable mural thrombus. These were only minimally enlarged when compared with prior scan was 2012. She was not a candidate for anticoagulation. Patient was transfused during her hospital course. She remained on metoprolol for chronic SVT and echocardiogram unremarkable. Bouts of delirium that has improved. Due to QTC prolonged it was advised to avoid any Haldol or Seroquel and to use Ativan if only absolutely necessary. She is tolerating a regular diet. Patient transferred to CIR on 08/10/2014 .   Patient currently requires min with mobility secondary to decreased endurance.  Prior to hospitalization, patient was modified independent  with mobility and lived with Alone in a House home.  Home access is 2 steps up in the garageRamped entrance.  Patient will benefit from skilled PT intervention to maximize safe functional mobility, minimize fall risk and decrease caregiver burden for planned discharge home with intermittent assist.  Anticipate patient will benefit from follow  up [facility] at discharge.  PT - End of Session Activity Tolerance: Tolerates 30+ min activity with multiple rests Endurance Deficit: Yes PT Assessment Rehab Potential (ACUTE/IP ONLY): Excellent Barriers to Discharge: Decreased caregiver support Barriers to Discharge Comments: Pt lives alone PT Patient demonstrates impairments in the following area(s): Balance;Endurance PT Transfers Functional Problem(s): Bed Mobility;Bed to Chair;Car PT Locomotion Functional Problem(s): Ambulation;Stairs PT Plan PT Intensity: Minimum of 1-2 x/day ,45 to 90 minutes PT Frequency: 5 out of 7 days PT Duration Estimated Length of Stay: 7 to 10 days PT Treatment/Interventions: Ambulation/gait training;Balance/vestibular training;Discharge planning;Functional mobility training;Patient/family education;Stair training;Therapeutic Activities;Therapeutic Exercise;UE/LE Strength taining/ROM PT Transfers Anticipated Outcome(s): modified independent with transfers: supervision with car transfer PT Locomotion Anticipated Outcome(s): modified independent with household ambulation; supervision stairs PT Recommendation Follow Up Recommendations: Home health PT Patient destination: Home Equipment Recommended: Rolling walker with 5" wheels Equipment Details: pt has straight cane  Skilled Therapeutic Intervention Patient sitting up in bed eating breakfast upon entering room. Patient participated in evaluation. Patient ambulated 150 feet with straight cane and min assist for balance. Patient ambulated 180 feet with RW requiring cueing to stay close to walker and only minimal contact assist for balance. Patient up and down 5 steps with bilateral rails and min assist. Patient exercised on Nustep x 5 minutes on level 3 for general strength and endurance. Patient left in wheelchair with all items in reach. OT present in room for B&D session.  PT Evaluation Precautions/Restrictions Precautions Precautions: Fall Restrictions Weight  Bearing Restrictions: No General Chart Reviewed: Yes Family/Caregiver Present: Yes (granddaughter present at beginning of session)  Pain Pain Assessment Pain Assessment: No/denies pain Home Living/Prior Functioning Home Living Available Help at Discharge: Family;Available PRN/intermittently Type of Home: House Home Access: Ramped entrance Entrance Stairs-Number of Steps: 2 steps up in the garage Home Layout: One level  Lives With: Alone Prior Function Level of Independence: Independent with homemaking with ambulation;Requires assistive device for independence  Able to Take Stairs?: Yes Driving: No   Cognition Arousal/Alertness: Awake/alert Orientation Level: Oriented to person;Oriented to place;Oriented to situation;Disoriented to time Safety/Judgment: Appears intact Sensation Sensation Light Touch: Appears Intact Stereognosis: Not tested Hot/Cold: Not tested Proprioception: Not tested Coordination Gross Motor Movements are Fluid and Coordinated: Yes Fine Motor Movements are Fluid and Coordinated: Yes Motor  Motor Motor: Within Functional Limits  Mobility Bed Mobility Bed Mobility: Supine to Sit Supine to Sit: 5: Supervision Transfers Transfers: Yes Stand Pivot Transfers: 4: Min assist Locomotion  Ambulation Ambulation: Yes Ambulation/Gait Assistance: 4: Min assist Ambulation Distance (Feet): 150 Feet Assistive device: Rolling walker;Straight cane Ambulation/Gait Assistance Details: patient requires more assistance with balance with straight cane; pt requires cueing to stay close to walker and steady assist for balance with RW Gait Gait: Yes Gait Pattern: Trunk flexed;Decreased stance time - right Gait velocity: decreased Stairs / Additional Locomotion Stairs: Yes Stairs Assistance: 4: Min assist Stair Management Technique: Two rails;Step to pattern Number of Stairs: 5   Balance Balance Balance Assessed: Yes Dynamic Sitting Balance Dynamic Sitting -  Balance Support: No upper extremity supported Dynamic Sitting - Level of Assistance: 5: Stand by assistance Reach (Patient is able to reach ___ inches to right, left, forward, back): 6 inches Dynamic Sitting - Balance Activities: Lateral lean/weight shifting;Forward lean/weight shifting;Reaching for objects Extremity Assessment  RUE Assessment  RUE Assessment: Within Functional Limits LUE Assessment LUE Assessment: Within Functional Limits RLE Assessment RLE Assessment: Exceptions to [facility] RLE Strength RLE Overall Strength Comments: grossly 4/5 strength; limited by arthritis pain in knee LLE Assessment LLE Assessment: Within Functional Limits  FIM:  FIM - Control and instrumentation engineer Devices: Best boy: 5: Supine > Sit: Supervision (verbal cues/safety issues);4: Bed > Chair or W/C: Min A (steadying Pt. > 75%);4: Chair or W/C > Bed: Min A (steadying Pt. > 75%) FIM - Locomotion: Ambulation Locomotion: Ambulation Assistive Devices: Cane - Straight;Walker - Rolling Ambulation/Gait Assistance: 4: Min assist Locomotion: Ambulation: 4: Travels 150 ft or more with minimal assistance (Pt.>75%) FIM - Locomotion: Stairs Locomotion: Scientist, physiological: Hand rail - 2 Locomotion: Stairs: 2: Up and Down 4 - 11 stairs with minimal assistance (Pt.>75%)   Refer to Care Plan for Long Term Goals  Recommendations for other services: None  Discharge Criteria: Patient will be discharged from PT if patient refuses treatment 3 consecutive times without medical reason, if treatment goals not met, if there is a change in medical status, if patient makes no progress towards goals or if patient is discharged from hospital.  The above assessment, treatment plan, treatment alternatives and goals were discussed and mutually agreed upon: by patient  Sanjuana Letters 08/11/2014, 10:12 AM

## 2014-08-11 NOTE — Progress Notes (Signed)
Physical Therapy Session Note  Patient Details  Name: [name] MRN: [id number] Date of Birth: 09-09-1921  Today's Date: 08/11/2014 PT Individual Time: 1100-1200 PT Individual Time Calculation (min): 60 min   Short Term Goals: Week 1:  PT Short Term Goal 1 (Week 1): STG's = LTG's due to ELOS  Skilled Therapeutic Interventions/Progress Updates:  Patient sitting in recliner upon entering room. Patient required mod assist for sit to stand from recliner due to height of seat. Patient completed BERG balance test scoring 26/56 indicating high risk for falls. Patient ambulated with RW 120 feet reporting that her legs were tired and needing to sit. Patient worked on ambulating over and around obstacles simulating home setting with min assist and occasional cueing for safety. Patient performed car transfer with min contact assist and cueing for sequence of steps. Patient returned to room and transferred to recliner. Patient set up for lunch requiring assist to open containers. Patient left in recliner with all items in reach.   Therapy Documentation Precautions:  Precautions Precautions: Fall Restrictions Weight Bearing Restrictions: No General: Chart Reviewed: Yes Family/Caregiver Present: Yes (granddaughter present at beginning of session)  Pain: Pain Assessment Pain Assessment: No/denies pain Mobility: Bed Mobility Bed Mobility: Supine to Sit Supine to Sit: 5: Supervision Transfers Transfers: Yes Stand Pivot Transfers: 4: Min assist Locomotion : Ambulation Ambulation: Yes Ambulation/Gait Assistance: 4: Min assist Ambulation Distance (Feet): 150 Feet Assistive device: Rolling walker;Straight cane Ambulation/Gait Assistance Details: patient requires more assistance with balance with straight cane; pt requires cueing to stay close to walker and steady assist for balance with RW Gait Gait: Yes Gait Pattern: Trunk flexed;Decreased stance time - right Gait velocity:  decreased Stairs / Additional Locomotion Stairs: Yes Stairs Assistance: 4: Min assist Stair Management Technique: Two rails;Step to pattern Number of Stairs: 5    Balance: Balance Balance Assessed: Yes Standardized Balance Assessment Standardized Balance Assessment: Berg Balance Test Berg Balance Test Sit to Stand: Able to stand  independently using hands Standing Unsupported: Able to stand 30 seconds unsupported Sitting with Back Unsupported but Feet Supported on Floor or Stool: Able to sit safely and securely 2 minutes Stand to Sit: Controls descent by using hands Transfers: Able to transfer safely, definite need of hands Standing Unsupported with Eyes Closed: Able to stand 10 seconds with supervision Standing Ubsupported with Feet Together: Needs help to attain position and unable to hold for 15 seconds From Standing, Reach Forward with Outstretched Arm: Reaches forward but needs supervision From Standing Position, Pick up Object from Floor: Able to pick up shoe, needs supervision From Standing Position, Turn to Look Behind Over each Shoulder: Turn sideways only but maintains balance Turn 360 Degrees: Needs close supervision or verbal cueing Standing Unsupported, Alternately Place Feet on Step/Stool: Able to complete >2 steps/needs minimal assist Standing Unsupported, One Foot in Front: Loses balance while stepping or standing Standing on One Leg: Unable to try or needs assist to prevent fall Total Score: 26 Dynamic Sitting Balance Dynamic Sitting - Balance Support: No upper extremity supported Dynamic Sitting - Level of Assistance: 5: Stand by assistance Reach (Patient is able to reach ___ inches to right, left, forward, back): 6 inches Dynamic Sitting - Balance Activities: Lateral lean/weight shifting;Forward lean/weight shifting;Reaching for objects  See FIM for current functional status  Therapy/Group: Individual Therapy  [name] 08/11/2014, 12:05 PM

## 2014-08-11 NOTE — Progress Notes (Signed)
Social Work Assessment and Plan Social Work Assessment and Plan  Patient Details  Name: [name] MRN: [id number] Date of Birth: 1922/02/19  Today's Date: 08/11/2014  Problem List:  Patient Active Problem List   Diagnosis Date Noted  . Debilitated 08/10/2014  . Physical deconditioning   . Hyperkalemia   . Acute blood loss anemia 08/04/2014  . Bleeding gastrointestinal   . Rectal bleeding 08/03/2014  . GI bleed 08/03/2014  . Melena 08/03/2014  . Protein-calorie malnutrition, severe 02/24/2014  . PSVT  02/23/2014  . History of GI bleed- Aug 2014 (NSAIDs) 02/23/2014  . SVT (supraventricular tachycardia) 02/23/2014  . DJD (degenerative joint disease) 04/07/2013  . Breast cancer- s/p mastectomy 03/11/2013  . GERD 03/10/2008  . GLAUCOMA 09/30/2007  . Essential hypertension 09/30/2007  . DEGENERATIVE JOINT DISEASE, LUMBAR SPINE 09/30/2007   Past Medical History:  Past Medical History  Diagnosis Date  . Hypertension   . Cancer 2011, 2012    left breast cancer 2011 central excision,2012 mastectomy  . Glaucoma   . Arthritis   . Upper GI bleed 01/2013.     Melena. HPylori negative gastritis on EGD.   [name] Complication of anesthesia     shortness of breath after procedure  . SVT (supraventricular tachycardia) 03/2014  . Protein-calorie malnutrition, severe 03/2014   Past Surgical History:  Past Surgical History  Procedure Laterality Date  . Cholecystectomy    . Breast surgery  2011  . Breast surgery  2012    lt breast masty  . Abdominal hysterectomy    . Esophagogastroduodenoscopy N/A 01/25/2013    Procedure: ESOPHAGOGASTRODUODENOSCOPY (EGD);  Surgeon: Jerene Bears, MD;  Location: [place];  Service: Gastroenterology;  Laterality: N/A;  . Esophagogastroduodenoscopy (egd) with propofol  08/04/2014    Procedure: ESOPHAGOGASTRODUODENOSCOPY (EGD) WITH PROPOFOL;  Surgeon: Inda Castle, MD;  Location: [place];  Service: Endoscopy;;   Social History:  reports that she  has never smoked. She has never used smokeless tobacco. She reports that she does not drink alcohol or use illicit drugs.  Family / Support Systems Marital Status: Widow/Widower Patient Roles: Parent Children: Ann-daughter  [phone] Other Supports: Mike-son [phone]    Tarsha-granddaughter  [phone] Anticipated Caregiver: Children and grandchildren Ability/Limitations of Caregiver: Daughter ambulates with and rest of the family work, can provide intermittent assist Caregiver Availability: Intermittent Family Dynamics: Close knit family pt has three son's and one daughter, along with numerous grandchildren and extended family members.  All are willing to do what they can but do have jobs and fmailies of their own.  Pt feels they can check on her but not necessary to stay with her.  Social History Preferred language: English Religion: Catholic Cultural Background: No issues Education: [employer] Read: Yes Write: Yes Employment Status: Retired Freight forwarder Issues: No issues Guardian/Conservator: None-according to MD pt is capable of making her own decisions while here.  For the last foru years her granddaughter-Tarsha has been assisting with decisions if needed.   Abuse/Neglect Physical Abuse: Denies Verbal Abuse: Denies Sexual Abuse: Denies Exploitation of patient/patient's resources: Denies Self-Neglect: Denies  Emotional Status Pt's affect, behavior adn adjustment status: Pt is motivated to improve and has always been independent and would like to achieve this level again.  She is one to pride herself on her independence, she has never drove but her family provides for this.  She is pleased with how well she is doing thus far and this is her first day on rehab. Recent Psychosocial Issues:  other medical issues-main issue is decondtioning from this GI bleed Pyschiatric History: No history deferred depression screen due to doing well and feels at her age  everyday is a gift.  She seems to be coping well with all of this and just wants to keep moving forward. Substance Abuse History: No issues  Patient / Family Perceptions, Expectations & Goals Pt/Family understanding of illness & functional limitations: Pt and daughter can explain her condition and have spoken with MD and feel they have a good understanding of her treatment plan and plan for rehab.  Expect to be here a short time. Premorbid pt/family roles/activities: Mother, grandmother, great grandmother, retiree,. church member, etc Anticipated changes in roles/activities/participation: resume Pt/family expectations/goals: Pt states: " I feel better and am doing better than I  thought I would, main problem is I'm weak."  Daughter states: " She is doing well and is a strong woman."  [employer]: None Premorbid Home Care/DME Agencies: Other (Comment) (had in past) Transportation available at discharge: family provided prior to [place] never has driven Resource referrals recommended: Support group (specify)  Discharge Planning Living Arrangements: Alone Support Systems: Children, Other relatives, Friends/neighbors, Social worker community Type of Residence: Private residence Insurance Resources: [employer] Financial Resources: [place] Referred: No Living Expenses: Own Money Management: Patient Does the patient have any problems obtaining your medications?: No Home Management: Patient and granddaughter Patient/Family Preliminary Plans: Return home with a family member will be with for a few days-for the transition from hospital.  Then will be intermittent checking on her and taking her to appointments and whatever she needs.  Pt is doing well for her first day. Social Work Anticipated Follow Up Needs: HH/OP, Support Group  Clinical Impression Pleasant 81 year old lady who is motivated and willing to work in therapies to regain her  independence and return home independently.  Her family is very supportive and checks on her daily and provides her Transportation.  Pt is high functioning and will be short length of stay-will talk with family regarding staying with a few days upon discharge for the transition.  Await team's evaluations.  [name] 08/11/2014, 11:43 AM

## 2014-08-11 NOTE — Progress Notes (Signed)
81 y.o.right handed  female with history of PSVT maintained on aspirin 81 mg daily, upper GI bleed August 2014. Independent with a cane prior to admission living alone. Presented 08/03/2014 with generalized weakness and feeling tired over the past week. She had a bowel movement which was notable for dark red blood. Patient noted also take daily mobic. Noted heart rate in the 140s to 150s. Hemoglobin 8.8-6.1 down from baseline 12.5. Started on intravenous Protonix. Gastroenterology service was consulted. Underwent EGD 08/04/2014 per Dr. Deatra Ina that showed no fresh blood and exam was otherwise unremarkable. Colonoscopy was not felt to be appropriate giving her advanced age. CT of abdomen and pelvis showed bilateral internal iliac artery aneurysms with considerable mural thrombus. These were only minimally enlarged when compared with prior scan was 2012. She was not a candidate for anticoagulation. Patient was transfused during her hospital course with hemoglobin 7.2 this morning with  1 unit packed red blood cells today which brought hgb to 8.3. She remained on metoprolol for chronic SVT and echocardiogram unremarkable. Bouts of delirium that has improved. Due to QTC prolonged it was advised to avoid any Haldol or Seroquel and to use Ativan if only absolutely necessary.   Subjective/Complaints: Chronic R>L knee pain Used some type of prescription creme at home Mild R shoulder soreness Review of Systems - Negative except R shoulder and knee pain Objective: Vital Signs: Blood pressure 154/56, pulse 80, temperature 98.4 F (36.9 C), temperature source Oral, resp. rate 20, SpO2 100 %. No results found. Results for orders placed or performed during the hospital encounter of 08/10/14 (from the past 72 hour(s))  CBC WITH DIFFERENTIAL     Status: Abnormal   Collection Time: 08/10/14  7:42 PM  Result Value Ref Range   WBC 12.7 (H) 4.0 - 10.5 K/uL   RBC 2.95 (L) 3.87 - 5.11 MIL/uL   Hemoglobin 8.3 (L) 12.0 -  15.0 g/dL   HCT 24.9 (L) 36.0 - 46.0 %   MCV 84.4 78.0 - 100.0 fL   MCH 28.1 26.0 - 34.0 pg   MCHC 33.3 30.0 - 36.0 g/dL   RDW 16.6 (H) 11.5 - 15.5 %   Platelets 255 150 - 400 K/uL   Neutrophils Relative % 78 (H) 43 - 77 %   Neutro Abs 9.8 (H) 1.7 - 7.7 K/uL   Lymphocytes Relative 13 12 - 46 %   Lymphs Abs 1.7 0.7 - 4.0 K/uL   Monocytes Relative 7 3 - 12 %   Monocytes Absolute 0.9 0.1 - 1.0 K/uL   Eosinophils Relative 2 0 - 5 %   Eosinophils Absolute 0.3 0.0 - 0.7 K/uL   Basophils Relative 0 0 - 1 %   Basophils Absolute 0.0 0.0 - 0.1 K/uL  Comprehensive metabolic panel     Status: Abnormal   Collection Time: 08/10/14  7:42 PM  Result Value Ref Range   Sodium 139 135 - 145 mmol/L   Potassium 3.5 3.5 - 5.1 mmol/L   Chloride 110 96 - 112 mmol/L   CO2 23 19 - 32 mmol/L   Glucose, Bld 120 (H) 70 - 99 mg/dL   BUN 21 6 - 23 mg/dL   Creatinine, Ser 0.80 0.50 - 1.10 mg/dL   Calcium 8.0 (L) 8.4 - 10.5 mg/dL   Total Protein 4.6 (L) 6.0 - 8.3 g/dL   Albumin 2.4 (L) 3.5 - 5.2 g/dL   AST 15 0 - 37 U/L   ALT 7 0 - 35 U/L   Alkaline  Phosphatase 58 39 - 117 U/L   Total Bilirubin 0.8 0.3 - 1.2 mg/dL   GFR calc non Af Amer 63 (L) >90 mL/min   GFR calc Af Amer 73 (L) >90 mL/min    Comment: (NOTE) The eGFR has been calculated using the CKD EPI equation. This calculation has not been validated in all clinical situations. eGFR's persistently <90 mL/min signify possible Chronic Kidney Disease.    Anion gap 6 5 - 15     HEENT: normal Cardio: RRR and no murmur Resp: CTA B/L and unlabored GI: BS positive and NT, ND Extremity:  Pulses positive and No Edema Skin:   Intact Neuro: Alert/Oriented, Normal Sensory and Abnormal Motor 4/5 bilateral delt, bi, tri, grip, HF, KE, ADF Musc/Skel:  Normal Gen NAD   Assessment/Plan: 1. Functional deficits secondary to severe deconditioning which require 3+ hours per day of interdisciplinary therapy in a comprehensive inpatient rehab  setting. Physiatrist is providing close team supervision and 24 hour management of active medical problems listed below. Physiatrist and rehab team continue to assess barriers to discharge/monitor patient progress toward functional and medical goals. FIM:                   Comprehension Comprehension Mode: Auditory Comprehension: 5-Follows basic conversation/direction: With extra time/assistive device  Expression Expression Mode: Verbal Expression: 5-Expresses basic needs/ideas: With extra time/assistive device  Social Interaction Social Interaction: 7-Interacts appropriately with others - No medications needed.  Problem Solving Problem Solving: 4-Solves basic 75 - 89% of the time/requires cueing 10 - 24% of the time  Memory Memory: 4-Recognizes or recalls 75 - 89% of the time/requires cueing 10 - 24% of the time  Medical Problem List and Plan: 1. Functional deficits secondary to severe deconditioning related to upper GI bleed/Anemia/Course complicated by Delerium. 2.  DVT Prophylaxis/Anticoagulation: SCD,s 3. Pain Management: tylenol as needed 4. Hypertension/PSVT.Lopressor 37.5 mg bid. HR and BP controlled at present[[watch for bradycardia-- adjust as indicated with increased activity 5. Neuropsych: This patient is capable of making basic decisions on her own behalf. Cognition improving 6. Skin/Wound Care: Routine skin checks. Skin intact presently. 7. Fluids/Electrolytes/Nutrition: Strict I and O in follow-up chemistries in AM. 8. GIB/Anemia: goal to keep hgb greater than 7. Received 1u prbc 3/8. Serial labs -no colonoscopy or interventions given age    LOS (Days) 1 A FACE TO FACE EVALUATION WAS PERFORMED  , E 08/11/2014, 7:19 AM

## 2014-08-12 ENCOUNTER — Inpatient Hospital Stay (HOSPITAL_COMMUNITY): Payer: Medicare Other | Admitting: Occupational Therapy

## 2014-08-12 ENCOUNTER — Inpatient Hospital Stay (HOSPITAL_COMMUNITY): Payer: Medicare Other | Admitting: *Deleted

## 2014-08-12 ENCOUNTER — Inpatient Hospital Stay (HOSPITAL_COMMUNITY): Payer: Medicare Other | Admitting: Physical Therapy

## 2014-08-12 ENCOUNTER — Telehealth: Payer: Self-pay | Admitting: *Deleted

## 2014-08-12 LAB — CBC
HEMATOCRIT: 24.7 % — AB (ref 36.0–46.0)
Hemoglobin: 8.3 g/dL — ABNORMAL LOW (ref 12.0–15.0)
MCH: 28.7 pg (ref 26.0–34.0)
MCHC: 33.6 g/dL (ref 30.0–36.0)
MCV: 85.5 fL (ref 78.0–100.0)
PLATELETS: 341 10*3/uL (ref 150–400)
RBC: 2.89 MIL/uL — ABNORMAL LOW (ref 3.87–5.11)
RDW: 17.1 % — ABNORMAL HIGH (ref 11.5–15.5)
WBC: 8.2 10*3/uL (ref 4.0–10.5)

## 2014-08-12 NOTE — Plan of Care (Signed)
Problem: RH Pre-functional (Specify) Goal: RH LTG Pre-functional (Specify) Goal added 08/12/2014-AH

## 2014-08-12 NOTE — Progress Notes (Signed)
Physical Therapy Session Note  Patient Details  Name: [name] MRN: [id number] Date of Birth: August 05, 1921  Today's Date: 08/12/2014 PT Individual Time: 0830-0930 PT Individual Time Calculation (min): 60 min   Short Term Goals: Week 1:  PT Short Term Goal 1 (Week 1): STG's = LTG's due to ELOS  Skilled Therapeutic Interventions/Progress Updates:   Pt received in bed finishing breakfast.  Denies pain but reporting need to urinate.  Pt transitioned to EOB with min A.  EOB pt donned underwear and pants with close supervision-min A when standing to pull up clothing secondary to intermittent posterior LOB in standing.  Ambulated to/from toilet with 2WW with min A and mod verbal cues for safe sequencing with RW in narrow spaces and around obstacles.  Required mod A to safely lower and stand from low toilet seat.  Pt able to stand with min A and doff, don clothing and perform hygiene.  Pt also able to stand at sink with min A for hand hygiene.    Performed gait training initially with 2WW in controlled environment x 125' with min A with mod-max verbal cues for more upright posture and to maintain safe distance to RW for increased support.  Educated pt on and demonstrated safe use of rollator [phone]) and use of brakes and seat for rest breaks/energy conservation.  Performed gait training in controlled environment over tile, carpet and around obstacles in narrow spaces to simulate home environment with 4WW with min-mod A overall with max verbal/questioning and visual cues for safe use of brakes, use of seat for rest breaks and to maintain upright posture and safe distance to 4WW.  Pt may benefit from use of rollator at home for more independence around the home (carrying items, seat for energy conservation, more efficient maneuvering around obstacles) but will defer equipment and safety with 4WW decision to primary PT.    Pt reports at home she has a ramped entrance and uses ramp.  She also reports she does  not visit her children because they have stairs to enter.  Discussed with pt reasoning for practicing and performing stairs in therapy for LE strengthening and endurance training.  Pt agreeable to practice stairs again.  Performed up/down 4 stairs with 2 rails and min A overall with verbal and tactile cues for safe step to sequencing secondary to R knee OA.  Therapy Documentation Precautions:  Precautions Precautions: Fall Restrictions Weight Bearing Restrictions: No Vital Signs: Therapy Vitals Pulse Rate: 74 Pain: Pain Assessment Pain Assessment: No/denies pain Locomotion : Ambulation Ambulation/Gait Assistance: 4: Min assist;3: Mod assist   See FIM for current functional status  Therapy/Group: Individual Therapy  Raylene Everts [facility] 08/12/2014, 9:44 AM

## 2014-08-12 NOTE — Progress Notes (Signed)
Patient information reviewed and entered into eRehab system by Esmay Amspacher, RN, CRRN, PPS Coordinator.  Information including medical coding and functional independence measure will be reviewed and updated through discharge.     Per nursing patient was given "Data Collection Information Summary for Patients in Inpatient Rehabilitation Facilities with attached "Privacy Act Statement-Health Care Records" upon admission.  

## 2014-08-12 NOTE — Telephone Encounter (Signed)
Called pt spoke with son Herbie [place]) to make TCM appt he stated mom was not d/c still in hosp they sent her to rehab 4th floor at [facility]...Johny Chess

## 2014-08-12 NOTE — Progress Notes (Signed)
Occupational Therapy Session Note  Patient Details  Name: [name] MRN: [id number] Date of Birth: 06-Mar-1922  Today's Date: 08/12/2014 OT Individual Time:  -    0930-1030 (37min)      Short Term Goals: Week 1:  OT Short Term Goal 1 (Week 1): STG= LTG Week 2:     Skilled Therapeutic Interventions/Progress Updates:     1st session:  pt. Sitting in recliner upon OT arrival.  Ambulated to sink and stood to perform bathing and dressing.  Pt. Stood for 10- 15 minutes with SBA.  Pt. Performed with no LOB.  Pt. Ambulated to chest of drawers to gather clothes.  Pt reported she makes sandwich, cooks grits, egg and cheese, coffee, sausage, toast.  Pt. Does not want to drink too much due to having to go to bathroom too much.    Left pt in bed with bed alarm ont on and call bell,phone within reach.     Therapy Documentation Precautions:  Precautions Precautions: Fall Restrictions Weight Bearing Restrictions: No     TPain:  Right  Knee 5/10 with standing or walking with 1st and 2nd session         Other Treatments:     2nd treatment:  Time:  1430-1530   (60 min) Pain:  See above Individual session  Pt. Used LE to propell Rollator walker to ADL apt.  Pt. Ambulated around kitchen with Rollator walker to make a cup of tea.  She needed cues for using walker safely and keeping it in correct positon during simple item prep.   Needed min cues for organizing/sequencing task.Pt showed 10-12 minutes of activity tolerance x 2  in standing before needing rest break.  Assisted pt back to room and left with family member.  Asked other OT to get safety belts for recliner.  Left with all needs in place  See FIM for current functional status  Therapy/Group: Individual Therapy  Lisa Roca 08/12/2014, 8:26 AM

## 2014-08-12 NOTE — Plan of Care (Signed)
Problem: RH PAIN MANAGEMENT Goal: RH STG PAIN MANAGED AT OR BELOW PT'S PAIN GOAL Outcome: Progressing Less than 3,on scale 1 to 10     

## 2014-08-12 NOTE — Progress Notes (Signed)
81 y.o.right handed  female with history of PSVT maintained on aspirin 81 mg daily, upper GI bleed August 2014. Independent with a cane prior to admission living alone. Presented 08/03/2014 with generalized weakness and feeling tired over the past week. She had a bowel movement which was notable for dark red blood. Patient noted also take daily mobic. Noted heart rate in the 140s to 150s. Hemoglobin 8.8-6.1 down from baseline 12.5. Started on intravenous Protonix. Gastroenterology service was consulted. Underwent EGD 08/04/2014 per Dr. Kaplan that showed no fresh blood and exam was otherwise unremarkable. Colonoscopy was not felt to be appropriate giving her advanced age. CT of abdomen and pelvis showed bilateral internal iliac artery aneurysms with considerable mural thrombus. These were only minimally enlarged when compared with prior scan was 2012. She was not a candidate for anticoagulation. Patient was transfused during her hospital course with hemoglobin 7.2 this morning with  1 unit packed red blood cells today which brought hgb to 8.3. She remained on metoprolol for chronic SVT and echocardiogram unremarkable. Bouts of delirium that has improved. Due to QTC prolonged it was advised to avoid any Haldol or Seroquel and to use Ativan if only absolutely necessary.   Subjective/Complaints: Feeling well. Denies pain. No dizziness or sob Mild R shoulder soreness Review of Systems - otherwise negative Objective: Vital Signs: Blood pressure 129/52, pulse 62, temperature 98.5 F (36.9 C), temperature source Oral, resp. rate 18, weight 71.215 kg (157 lb), SpO2 100 %. No results found. Results for orders placed or performed during the hospital encounter of 08/10/14 (from the past 72 hour(s))  CBC WITH DIFFERENTIAL     Status: Abnormal   Collection Time: 08/10/14  7:42 PM  Result Value Ref Range   WBC 12.7 (H) 4.0 - 10.5 K/uL   RBC 2.95 (L) 3.87 - 5.11 MIL/uL   Hemoglobin 8.3 (L) 12.0 - 15.0 g/dL   HCT  24.9 (L) 36.0 - 46.0 %   MCV 84.4 78.0 - 100.0 fL   MCH 28.1 26.0 - 34.0 pg   MCHC 33.3 30.0 - 36.0 g/dL   RDW 16.6 (H) 11.5 - 15.5 %   Platelets 255 150 - 400 K/uL   Neutrophils Relative % 78 (H) 43 - 77 %   Neutro Abs 9.8 (H) 1.7 - 7.7 K/uL   Lymphocytes Relative 13 12 - 46 %   Lymphs Abs 1.7 0.7 - 4.0 K/uL   Monocytes Relative 7 3 - 12 %   Monocytes Absolute 0.9 0.1 - 1.0 K/uL   Eosinophils Relative 2 0 - 5 %   Eosinophils Absolute 0.3 0.0 - 0.7 K/uL   Basophils Relative 0 0 - 1 %   Basophils Absolute 0.0 0.0 - 0.1 K/uL  Comprehensive metabolic panel     Status: Abnormal   Collection Time: 08/10/14  7:42 PM  Result Value Ref Range   Sodium 139 135 - 145 mmol/L   Potassium 3.5 3.5 - 5.1 mmol/L   Chloride 110 96 - 112 mmol/L   CO2 23 19 - 32 mmol/L   Glucose, Bld 120 (H) 70 - 99 mg/dL   BUN 21 6 - 23 mg/dL   Creatinine, Ser 0.80 0.50 - 1.10 mg/dL   Calcium 8.0 (L) 8.4 - 10.5 mg/dL   Total Protein 4.6 (L) 6.0 - 8.3 g/dL   Albumin 2.4 (L) 3.5 - 5.2 g/dL   AST 15 0 - 37 U/L   ALT 7 0 - 35 U/L   Alkaline Phosphatase 58 39 -   117 U/L   Total Bilirubin 0.8 0.3 - 1.2 mg/dL   GFR calc non Af Amer 63 (L) >90 mL/min   GFR calc Af Amer 73 (L) >90 mL/min    Comment: (NOTE) The eGFR has been calculated using the CKD EPI equation. This calculation has not been validated in all clinical situations. eGFR's persistently <90 mL/min signify possible Chronic Kidney Disease.    Anion gap 6 5 - 15     HEENT: normal Cardio: RRR and no murmur, occ PAC Resp: CTA B/L and unlabored GI: BS positive and NT, ND Extremity:  Pulses positive and No Edema Skin:   Intact Neuro: Alert/Oriented, Normal Sensory and Abnormal Motor 4/5 bilateral delt, bi, tri, grip, HF, KE, ADF Musc/Skel:  Normal Gen NAD   Assessment/Plan: 1. Functional deficits secondary to severe deconditioning which require 3+ hours per day of interdisciplinary therapy in a comprehensive inpatient rehab setting. Physiatrist is  providing close team supervision and 24 hour management of active medical problems listed below. Physiatrist and rehab team continue to assess barriers to discharge/monitor patient progress toward functional and medical goals. FIM: FIM - Bathing Bathing Steps Patient Completed: Chest, Right Arm, Left Arm, Abdomen, Front perineal area, Buttocks, Right upper leg, Left upper leg Bathing: 4: Min-Patient completes 8-9 44f10 parts or 75+ percent  FIM - Upper Body Dressing/Undressing Upper body dressing/undressing steps patient completed: Thread/unthread right sleeve of front closure shirt/dress, Thread/unthread left sleeve of front closure shirt/dress, Pull shirt around back of front closure shirt/dress, Button/unbutton shirt Upper body dressing/undressing: 5: Set-up assist to: Obtain clothing/put away FIM - Lower Body Dressing/Undressing Lower body dressing/undressing steps patient completed: Thread/unthread right pants leg, Thread/unthread left pants leg, Pull pants up/down, Don/Doff left shoe, Fasten/unfasten right shoe, Don/Doff right shoe, Fasten/unfasten left shoe Lower body dressing/undressing: 4: Min-Patient completed 75 plus % of tasks  FIM - Toileting Toileting steps completed by patient: Adjust clothing prior to toileting, Performs perineal hygiene, Adjust clothing after toileting Toileting Assistive Devices: Grab bar or rail for support Toileting: 4: Steadying assist  FIM - TRadio producerDevices: Elevated toilet seat, Grab bars, WInsurance account managerTransfers: 4-To toilet/BSC: Min A (steadying Pt. > 75%), 3-From toilet/BSC: Mod A (lift or lower assist)  FIM - BControl and instrumentation engineerDevices: CBest boy 5: Supine > Sit: Supervision (verbal cues/safety issues), 4: Bed > Chair or W/C: Min A (steadying Pt. > 75%), 4: Chair or W/C > Bed: Min A (steadying Pt. > 75%)  FIM - Locomotion: Ambulation Locomotion: Ambulation Assistive  Devices: WAdministratorAmbulation/Gait Assistance: 4: Min assist Locomotion: Ambulation: 2: Travels 50 - 149 ft with minimal assistance (Pt.>75%)  Comprehension Comprehension Mode: Auditory Comprehension: 4-Understands basic 75 - 89% of the time/requires cueing 10 - 24% of the time  Expression Expression Mode: Verbal Expression: 4-Expresses basic 75 - 89% of the time/requires cueing 10 - 24% of the time. Needs helper to occlude trach/needs to repeat words.  Social Interaction Social Interaction: 6-Interacts appropriately with others with medication or extra time (anti-anxiety, antidepressant).  Problem Solving Problem Solving: 3-Solves basic 50 - 74% of the time/requires cueing 25 - 49% of the time  Memory Memory: 3-Recognizes or recalls 50 - 74% of the time/requires cueing 25 - 49% of the time  Medical Problem List and Plan: 1. Functional deficits secondary to severe deconditioning related to upper GI bleed/Anemia/Course complicated by Delerium. 2.  DVT Prophylaxis/Anticoagulation: SCD,s 3. Pain Management: tylenol as needed 4. Hypertension/PSVT.Lopressor 37.5 mg bid. HR  and BP controlled at present[[watch for bradycardia-- adjust as indicated with increased activity 5. Neuropsych: This patient is capable of making basic decisions on her own behalf. Cognition improving 6. Skin/Wound Care: Routine skin checks. Skin intact presently. 7. Fluids/Electrolytes/Nutrition: Strict I and O in follow-up chemistries in AM. 8. GIB/Anemia: goal to keep hgb greater than 7. Received 1u prbc 3/8.  s -no colonoscopy or interventions given age -check cbc today    LOS (Days) 2 A FACE TO FACE EVALUATION WAS PERFORMED  , T 08/12/2014, 8:19 AM    

## 2014-08-12 NOTE — IPOC Note (Signed)
Overall Plan of Care [facility]) [name] MRN: [id number] DOB: November 19, 1921  Admitting Diagnosis: GI bleed  Hospital Problems: Principal Problem:   Debilitated Active Problems:   GI bleed   Acute blood loss anemia     Functional Problem List: Nursing Endurance, Motor, Safety  PT Balance, Endurance  OT Balance, Endurance, Motor, Safety  SLP    TR         Basic ADL's: OT Grooming, Bathing, Dressing, Toileting     Advanced  ADL's: OT Simple Meal Preparation     Transfers: PT Bed Mobility, Bed to Chair, Car  OT Toilet, Tub/Shower     Locomotion: PT Ambulation, Stairs     Additional Impairments: OT None  SLP        TR      Anticipated Outcomes Item Anticipated Outcome  Self Feeding n/a  Swallowing      Basic self-care  Mod I - supervision  Toileting  Mod I   Bathroom Transfers Mod I - toilet and supervision for shower  Bowel/Bladder  Continent to bowel and bladder.  Transfers  modified independent with transfers: supervision with car transfer  Locomotion  modified independent with household ambulation; supervision stairs  Communication     Cognition     Pain  Less than 3,on scale 1 to 10  Safety/Judgment  Pt. will be free from falls.   Therapy Plan: PT Intensity: Minimum of 1-2 x/day ,45 to 90 minutes PT Frequency: 5 out of 7 days PT Duration Estimated Length of Stay: 7 to 10 days OT Intensity: Minimum of 1-2 x/day, 45 to 90 minutes OT Frequency: 5 out of 7 days OT Duration/Estimated Length of Stay: 7-10 days         Team Interventions: Nursing Interventions Patient/Family Education, Discharge Planning  PT interventions Ambulation/gait training, Training and development officer, Discharge planning, Functional mobility training, Patient/family education, Stair training, Therapeutic Activities, Therapeutic Exercise, UE/LE Strength taining/ROM  OT Interventions Balance/vestibular training, Discharge planning, Self Care/advanced  ADL retraining, Therapeutic Activities, UE/LE Coordination activities, Functional mobility training, Patient/family education, Therapeutic Exercise, Community reintegration, Engineer, drilling, Neuromuscular re-education, UE/LE Strength taining/ROM  SLP Interventions    TR Interventions    SW/CM Interventions Discharge Planning, Psychosocial Support, Patient/Family Education    Team Discharge Planning: Destination: PT-Home ,OT- Home , SLP-  Projected Follow-up: PT-Home health PT, OT-  24 hour supervision/assistance, Home health OT, SLP-  Projected Equipment Needs: PT-Rolling walker with 5" wheels, OT- Tub/shower bench, 3 in 1 bedside comode, SLP-  Equipment Details: PT-pt has straight cane, OT-  Patient/family involved in discharge planning: PT- Patient,  OT-Patient, SLP-   MD ELOS: 7-10 days Medical Rehab Prognosis:  Excellent Assessment: The patient has been admitted for CIR therapies with the diagnosis of debility related to GIB/anemia/multiple medical. The team will be addressing functional mobility, strength, stamina, balance, safety, adaptive techniques and equipment, self-care, bowel and bladder mgt, patient and caregiver education, activity tolerance, ego support, community reintegration. Goals have been set at mod I for basic mobility and self-care   Barbara Staggers, MD, [employer]      See Team Conference Notes for weekly updates to the plan of care

## 2014-08-13 ENCOUNTER — Encounter (HOSPITAL_COMMUNITY): Payer: Self-pay | Admitting: Internal Medicine

## 2014-08-13 ENCOUNTER — Inpatient Hospital Stay (HOSPITAL_COMMUNITY): Payer: Medicare Other

## 2014-08-13 NOTE — Progress Notes (Signed)
Physical Therapy Session Note  Patient Details  Name: [name] MRN: [id number] Date of Birth: [birth year]  Today's Date: 08/13/2014 PT Individual Time: 1030-1135 PT Individual Time Calculation (min): 65 min   Short Term Goals: Week 1:  PT Short Term Goal 1 (Week 1): STG's = LTG's due to ELOS  Skilled Therapeutic Interventions/Progress Updates:    Tx 1 (am): Pt received in recliner, agreeable to therapy, no c/o pain. No c/o dizziness or nausea throughout session when asked by PT, but pt reports that her "pressure" is high and that sometimes at home she did feel dizzy from time to time.   Therapeutic activity:  - Sit >< stand with Min A first trial, S overall; w/c > recliner with S and stand-pivot; demo and VCs for technique with hand placement for safety - Toilet transfer with RW and Min A, see FIM - Berg balance test: 20/56, high risk for falls; PT explained scoring, use of outcome measures, and pt verbalized understanding of her risk category.  - pt reports R knee "gives her problems" during activity; visual ingrossly with swelling R>L and heat.  - pt with (premorbid) scoliosis, R convexity  Gait training: - pt ambulated with RW x 150 and Min guard; VCs for upright gaze and increased BOS.  - gait observations: trunk flexed (pt with scoliosis, as above), LE valgus pattern (R>L, R medial collapse), reduced gait velocity, reduced foot strike pattern/poor clearance, downward gaze  Pt returned to room via w/c for energy conservation with good motivation for therapy to improve her balance. She reported throughout session that she has had "many" falls, including one just last week where she hit her head (L frontal). Pt verbalizes understanding of PT role in improving balance and mobility and for reduction of falls risk. Cont per PT POC  Therapy Documentation Precautions:  Precautions Precautions: Fall Restrictions Weight Bearing Restrictions: No   Vital Signs: Pulse Rate:  65 Resp: 19 BP: (!) 165/79 mmHg  Patient Position (if appropriate): Sitting, after exertion Oxygen Therapy SpO2: 100 % O2 Device: Not Delivered Pain: Pain Assessment Pain Assessment: No/denies pain  See FIM for current functional status  Therapy/Group: Individual Therapy    Carey Bullocks, SPT  08/13/2014, 4:13 PM

## 2014-08-13 NOTE — Progress Notes (Signed)
Physical Therapy Session Note  Patient Details  Name: [name] MRN: [id number] Date of Birth: [birth year]  Today's Date: 08/13/2014 PT Individual Time: 3343-5686 PT Individual Time Calculation (min): 33 min   Short Term Goals: Week 1:  PT Short Term Goal 1 (Week 1): STG's = LTG's due to ELOS  Skilled Therapeutic Interventions/Progress Updates:  Seated therapeutic exercise performed with LEs to increase strength for functional mobility: 5 x 2 each: R/L hip flex; knee ext, ankle PF  Sit> stand from recliner with extra time, cues to scoot forward, with close supervision, spontaneously turning to the L to decrease wt bearing RLE, safely.  Gait with 4WW x 100' with close supervision, cues for brakes, upright posture and forward gaze. Up/down 4 steps 2 rails x 2 with min assist fading to supervision, without cues for step to L leading ascending, with 1 cue for step to R leading when descending. Up/down curb and ramp with 4WW with min/mod assist to pick up and place 4WW safely, cues for leading up with LLE.  Gait x 150 with 4WW, close supervision> min guard assist, cues as above; included 1 stop to practice locking brakes, turning and sitting.  Pt requires extra time and cues to manipulate brakes appropriately.  Pt left reclined in recliner in room, quick release belt applied and all needs in place.    Therapy Documentation Precautions:  Precautions Precautions: Fall Restrictions Weight Bearing Restrictions: No   Pain: Pain Assessment Pain Assessment: No/denies pain   Locomotion : Ambulation Ambulation/Gait Assistance: 4: Min guard    See FIM for current functional status  Therapy/Group: Individual Therapy  Jadarious Dobbins 08/13/2014, 4:16 PM

## 2014-08-13 NOTE — Progress Notes (Signed)
[name] is a 81 y.o. female March 10, 1922 [id number]  Subjective: No new complaints. No problems. Slept well. Feeling OK. Therapy reports +BM this AM with blood or melena   Objective: Vital signs in last 24 hours: Temp:  [98.7 F (37.1 C)-99.2 F (37.3 C)] 98.7 F (37.1 C) (03/12 0628) Pulse Rate:  [64-68] 66 (03/12 0628) Resp:  [17] 17 (03/12 0628) BP: (122-146)/(50-59) 146/59 mmHg (03/12 0628) SpO2:  [100 %] 100 % (03/12 0628) Weight change:  Last BM Date: 08/12/14 (yesterday per patient)  Intake/Output from previous day: 03/11 0701 - 03/12 0700 In: 480 [P.O.:480] Out: -   Physical Exam General: No apparent distress   Standing at sink to bathe. Therapy in room Lungs: Normal effort. Lungs clear to auscultation, no crackles or wheezes. Cardiovascular: Regular rate and rhythm, no edema BLE Musculoskeletal:  Neurovascularly intact Neurological: No neurological deficits. Cognition appropriate Wounds: N/A      Lab Results: BMET    Component Value Date/Time   NA 139 08/10/2014 1942   NA 141 03/04/2012 1301   K 3.5 08/10/2014 1942   K 4.5 03/04/2012 1301   CL 110 08/10/2014 1942   CL 105 03/04/2012 1301   CO2 23 08/10/2014 1942   CO2 26 03/04/2012 1301   GLUCOSE 120* 08/10/2014 1942   GLUCOSE 94 03/04/2012 1301   BUN 21 08/10/2014 1942   BUN 18.0 03/04/2012 1301   CREATININE 0.80 08/10/2014 1942   CREATININE 0.9 03/04/2012 1301   CREATININE 0.89 03/21/2011 1130   CALCIUM 8.0* 08/10/2014 1942   CALCIUM 9.5 03/04/2012 1301   GFRNONAA 63* 08/10/2014 1942   GFRAA 73* 08/10/2014 1942   CBC    Component Value Date/Time   WBC 8.2 08/12/2014 0950   WBC 5.7 03/04/2012 1301   RBC 2.89* 08/12/2014 0950   RBC 4.54 03/04/2012 1301   HGB 8.3* 08/12/2014 0950   HGB 13.4 03/04/2012 1301   HCT 24.7* 08/12/2014 0950   HCT 40.4 03/04/2012 1301   PLT 341 08/12/2014 0950   PLT 328 03/04/2012 1301   MCV 85.5 08/12/2014 0950   MCV 88.9 03/04/2012 1301   MCH 28.7  08/12/2014 0950   MCH 29.6 03/04/2012 1301   MCHC 33.6 08/12/2014 0950   MCHC 33.2 03/04/2012 1301   RDW 17.1* 08/12/2014 0950   RDW 13.6 03/04/2012 1301   LYMPHSABS 1.7 08/10/2014 1942   LYMPHSABS 1.8 03/04/2012 1301   MONOABS 0.9 08/10/2014 1942   MONOABS 0.5 03/04/2012 1301   EOSABS 0.3 08/10/2014 1942   EOSABS 0.2 03/04/2012 1301   BASOSABS 0.0 08/10/2014 1942   BASOSABS 0.0 03/04/2012 1301   CBG's (last 3):  No results for input(s): GLUCAP in the last 72 hours. LFT's Lab Results  Component Value Date   ALT 7 08/10/2014   AST 15 08/10/2014   ALKPHOS 58 08/10/2014   BILITOT 0.8 08/10/2014    Studies/Results: No results found.  Medications:  I have reviewed the patient's current medications. Scheduled Medications: . latanoprost  1 drop Both Eyes QHS  . metoprolol tartrate  37.5 mg Oral BID  . pantoprazole  40 mg Oral BID   PRN Medications: acetaminophen, MUSCLE RUB, ondansetron **OR** ondansetron (ZOFRAN) IV, sorbitol  Assessment/Plan: Principal Problem:   Debilitated Active Problems:   GI bleed   Acute blood loss anemia  1. Functional deficits secondary to severe deconditioning related to upper GI bleed/Anemia/Course complicated by Delerium. 2. DVT Prophylaxis/Anticoagulation: SCDs 3. Pain Management: tylenol as needed 4. Hypertension/PSVT. Lopressor 37.5 mg bid.  HR and BP controlled at present. Will watch for bradycardia-- adjust as indicated with increased activity 5. Neuropsych: This patient is capable of making basic decisions on her own behalf. Cognition improving, appears baseline 6. Skin/Wound Care: Routine skin checks. Skin intact presently. 7. Fluids/Electrolytes/Nutrition: monitor 8. GIB/Anemia: goal to keep hgb greater than 7. Received 1u prbc 3/8. Continue PPI and avoid ASA and NSAIDs -no colonoscopy or interventions given age   Length of stay, days: 3    Vung Kush A. Asa Lente, MD 08/13/2014, 10:12 AM

## 2014-08-13 NOTE — Progress Notes (Signed)
Occupational Therapy Session Note  Patient Details  Name: [name] MRN: [id number] Date of Birth: 1922-04-18  Today's Date: 08/13/2014 OT Individual Time: 8003-4917 OT Individual Time Calculation (min): 60 min    Short Term Goals: Week 1:  OT Short Term Goal 1 (Week 1): STG= LTG  Skilled Therapeutic Interventions/Progress Updates: ADL-retraining with focus on improved activity tolerance, safety awareness, effective use of AD/DME, and dynamic standing balance.   Pt received supine in bed awaiting therapist for planned session.    Pt receptive for bathing at sink after toileting, LRAD for mobility and requesting cane during this session.    Pt ambulated to bathroom with close supervision and min vc for improved posture d/t excessive forward lean.   Pt required supervision assist to transfer to toilet and deferred use of BSC over toilet to elevate seat.   Pt toileted voiding urine and completed clothing management and hygiene unassisted but required mod assist to rise off toilet and then accepted suggestion to place [facility] over toilet for improved performance.   Pt then ambulated to her sink with close supervision and progressed through bathing task, standing for approx 15 minutes to complete task with only setup assist to provide supplies and supervision for safety.   Pt was encouraged to rest briefly using rollator walker as seat while therapist gathered clothing as patient dried herself.    Pt rose from rollator and dressed lower body standing at sink using UE, alternating hands for support, to maintain her balance as she reached to floor to lace and pull up both her underwear and pants.  Pt then groomed standing at sink and recovered to recliner at end of session to don her socks and slippers while resting prior to physical therapy session.   No LOB or report of dizziness noted throughout session.   Pt was educated on need for brief pause after standing prior to ambulating.  OT secured safety belt  and placed call light and phone within reach.    Therapy Documentation Precautions:  Precautions Precautions: Fall Restrictions Weight Bearing Restrictions: No  Vital Signs: Therapy Vitals Temp: 98.7 F (37.1 C) Temp Source: Oral Pulse Rate: 66 Resp: 17 BP: (!) 146/59 mmHg Patient Position (if appropriate): Lying Oxygen Therapy SpO2: 100 % O2 Device: Not Delivered   Pain: Pain Assessment Pain Assessment: No/denies pain  Other Treatments:    See FIM for current functional status  Therapy/Group: Individual Therapy   Second session: Time: 1400-1430 Time Calculation (min):  30 min  Pain Assessment: No/denies pain  Skilled Therapeutic Interventions: ADL-retraining with focus on improved toilet transfer, safety awareness, family education and lower body dressing concerns (15 min)   Pt received seated in her recliner with daughter and son present in room.    OT educated family on pt's excellent progress during am session and confirmed pt account of supports and supervision available s/p d/c.    Family excused themselves to allow continuation of session and pt donned shoes, although reporting poor fit.   During inspection of shoes, diabetic style, pt confided that her shoes were given to her by a friend who could no longer wear them.   OT noted possible custom insert and advised pt to reconsider using shoes while educating pt on possible adverse impact with gait caused by change in custom arch supports.  Pt declared that the only benefits of wearing those shoes is d/t velcro closures, financial savings and convenience.   Pt stated she would cease wearing shoes if advised  to do so by MD or physical therapist.  OT offered to alert team for further review.    Pt then progressed to therapeutic activity (functional mobility and homemaking) and ambulated from her room to ADL apartment using her cane and close supervision as OT followed with rollator.   Pt required extra time and min vc to  avoid obstacles during mobility and rested at loveseat after arriving in apartment.   After education on purpose of activity, pt completed 1 homemaking task: replace bed cover, with supervision for safety. Pt elected to defer use of cane while performing tasking, and used bed as support while walking around perimeter of bed to replace bed cover.   Pt then ambulated back to her room using her cane and returned to recliner for rest.   No LOB noted during functional task however supervision is recommended for safety with homemaking.  See FIM for current functional status  Therapy/Group: Individual Therapy  [name] 08/13/2014, 8:57 AM

## 2014-08-14 ENCOUNTER — Inpatient Hospital Stay (HOSPITAL_COMMUNITY): Payer: Medicare Other | Admitting: *Deleted

## 2014-08-14 NOTE — Plan of Care (Signed)
Problem: RH SAFETY Goal: RH STG ADHERE TO SAFETY PRECAUTIONS W/ASSISTANCE/DEVICE STG Adhere to Safety Precautions With min Assistance/Device.  Outcome: Progressing Bed alarm on, pt will at times try to get up unassisted.

## 2014-08-14 NOTE — Progress Notes (Signed)
Physical Therapy Session Note  Patient Details  Name: [name] MRN: [id number] Date of Birth: 03-May-1922  Today's Date: 08/14/2014 PT Individual Time: 6283-1517 PT Individual Time Calculation (min): 50 min   Short Term Goals: Week 1:  PT Short Term Goal 1 (Week 1): STG's = LTG's due to ELOS  Skilled Therapeutic Interventions/Progress Updates:    Pt received EOB, donning shoes. Pt with no c/o pain, reports R knee "bothers her a little" (premorbid) and that she "always feels dizzy" and "pressure runs high in her family;" vitals assessed throughout session as below. Treatment focused on functional balance via gait training and NMR. Therapeutic activity via basic transfers with S overall/occasional Min A from low surface, dynamic standing including grooming at sink; VCs and demo for safe hand placement. Gait training with RW and Min guard 2 x 150'; VCs and demo for upright posture and safety during turns. Pt demonstrates strong tendency to push RW out in front of her, despite multimodal cues for correct posture; continue to assess appropriate AD (maybe better with 4WW?). Pt had 1 LOB to L while turning L into the therapy gym due to narrow BOS. NMR via forced use and multimodal cues with blocked practice of sit to stand, reducing UE support, and single limb stance tasks with BUE support at stairs for functional balance exercise. Pt reports that she typically loses her balance while leaning forward, as in sit to stand transfers. Pt motivated to work to exercise her balance. Pt returned to room and positioned in recliner with quick release belt, all needs within reach.  Therapy Documentation Precautions:  Precautions Precautions: Fall Restrictions Weight Bearing Restrictions: No   Vital Signs: Therapy Vitals BP: (!) 145/65 mmHg Patient Position (if appropriate): Sitting (after rest break) Pain: Pain Assessment Pain Assessment: No/denies pain  See FIM for current functional  status  Therapy/Group: Individual Therapy  Carey Bullocks, SPT  08/14/2014, 10:06 AM

## 2014-08-14 NOTE — Progress Notes (Signed)
[name] is a 81 y.o. female 12-27-1921 [id number]  Subjective: Denies new complaint or any problems. Slept well. Feeling OK. No blood with BM  Objective: Vital signs in last 24 hours: Temp:  [97.6 F (36.4 C)-98.5 F (36.9 C)] 98.5 F (36.9 C) (03/13 0505) Pulse Rate:  [65-76] 72 (03/13 0505) Resp:  [19-20] 20 (03/13 0505) BP: (134-169)/(59-80) 145/65 mmHg (03/13 0913) SpO2:  [100 %] 100 % (03/13 0505) Weight change:  Last BM Date: 08/12/14  Intake/Output from previous day: 03/12 0701 - 03/13 0700 In: 480 [P.O.:480] Out: -   Physical Exam General: No apparent distress    Lungs: Normal effort. Lungs clear to auscultation, no crackles or wheezes. Cardiovascular: Regular rate and rhythm, no edema BLE Musculoskeletal:  Neurovascularly intact Neurological: No neurological deficits. Cognition appropriate Wounds: N/A      Lab Results: BMET    Component Value Date/Time   NA 139 08/10/2014 1942   NA 141 03/04/2012 1301   K 3.5 08/10/2014 1942   K 4.5 03/04/2012 1301   CL 110 08/10/2014 1942   CL 105 03/04/2012 1301   CO2 23 08/10/2014 1942   CO2 26 03/04/2012 1301   GLUCOSE 120* 08/10/2014 1942   GLUCOSE 94 03/04/2012 1301   BUN 21 08/10/2014 1942   BUN 18.0 03/04/2012 1301   CREATININE 0.80 08/10/2014 1942   CREATININE 0.9 03/04/2012 1301   CREATININE 0.89 03/21/2011 1130   CALCIUM 8.0* 08/10/2014 1942   CALCIUM 9.5 03/04/2012 1301   GFRNONAA 63* 08/10/2014 1942   GFRAA 73* 08/10/2014 1942   CBC    Component Value Date/Time   WBC 8.2 08/12/2014 0950   WBC 5.7 03/04/2012 1301   RBC 2.89* 08/12/2014 0950   RBC 4.54 03/04/2012 1301   HGB 8.3* 08/12/2014 0950   HGB 13.4 03/04/2012 1301   HCT 24.7* 08/12/2014 0950   HCT 40.4 03/04/2012 1301   PLT 341 08/12/2014 0950   PLT 328 03/04/2012 1301   MCV 85.5 08/12/2014 0950   MCV 88.9 03/04/2012 1301   MCH 28.7 08/12/2014 0950   MCH 29.6 03/04/2012 1301   MCHC 33.6 08/12/2014 0950   MCHC 33.2 03/04/2012  1301   RDW 17.1* 08/12/2014 0950   RDW 13.6 03/04/2012 1301   LYMPHSABS 1.7 08/10/2014 1942   LYMPHSABS 1.8 03/04/2012 1301   MONOABS 0.9 08/10/2014 1942   MONOABS 0.5 03/04/2012 1301   EOSABS 0.3 08/10/2014 1942   EOSABS 0.2 03/04/2012 1301   BASOSABS 0.0 08/10/2014 1942   BASOSABS 0.0 03/04/2012 1301   CBG's (last 3):  No results for input(s): GLUCAP in the last 72 hours. LFT's Lab Results  Component Value Date   ALT 7 08/10/2014   AST 15 08/10/2014   ALKPHOS 58 08/10/2014   BILITOT 0.8 08/10/2014    Studies/Results: No results found.  Medications:  I have reviewed the patient's current medications. Scheduled Medications: . latanoprost  1 drop Both Eyes QHS  . metoprolol tartrate  37.5 mg Oral BID  . pantoprazole  40 mg Oral BID   PRN Medications: acetaminophen, MUSCLE RUB, ondansetron **OR** ondansetron (ZOFRAN) IV, sorbitol  Assessment/Plan: Principal Problem:   Debilitated Active Problems:   GI bleed   Acute blood loss anemia  1. Functional deficits secondary to severe deconditioning related to upper GI bleed/Anemia; course complicated by Delerium. Improving. Continue PT/OT as ongoing 2. DVT Prophylaxis/Anticoagulation: SCDs 3. Pain Management: tylenol as needed 4. Hypertension/PSVT. Lopressor 37.5 mg bid. HR and BP controlled at present. Watch for bradycardia--  adjust as indicated with increased activity 5. Neuropsych: This patient is capable of making basic decisions on her own behalf. Cognition improving, appears baseline 6. Skin/Wound Care: Routine skin checks. Skin intact presently. 7. Fluids/Electrolytes/Nutrition: monitor 8. GIB/Anemia: goal to keep hgb greater than 7. Received 1u prbc 3/8. Continue PPI and avoid ASA and NSAIDs -no colonoscopy or interventions given age   Length of stay, days: 4    [name], MD 08/14/2014, 9:32 AM

## 2014-08-15 ENCOUNTER — Inpatient Hospital Stay (HOSPITAL_COMMUNITY): Payer: Medicare Other

## 2014-08-15 ENCOUNTER — Inpatient Hospital Stay (HOSPITAL_COMMUNITY): Payer: Medicare Other | Admitting: Occupational Therapy

## 2014-08-15 NOTE — Progress Notes (Signed)
Occupational Therapy Session Note  Patient Details  Name: [name] MRN: [id number] Date of Birth: 04/06/1922  Today's Date: 08/15/2014 OT Individual Time: 0900-1000 OT Individual Time Calculation (min): 60 min    Short Term Goals: Week 1:  OT Short Term Goal 1 (Week 1): STG= LTG  Skilled Therapeutic Interventions/Progress Updates:    Pt seen for BADL retraining with a focus on dynamic balance, problem solving, and activity tolerance. Pt agreeable to trying a shower for the first time as she has only bathed in a tub at home. Pt used RW to ambulate to toilet with min A to support balance and prevent occasional posterior lean. Pt sat on toilet to undress with close S. She then stepped into shower to sit on bench and bathed with steady A.  She was cautious to completely dry off before stepping out to an arm chair place in bathroom to dress from. Occasional steady A to with LB dressing, but overall her balance improved throughout the session. She was able to converse well about prior events and her history, but did have difficulty following steps for new things such as managing the RW through tight spaces. Pt only needed a few short rest breaks, but did become fatigued at the end and decided to wait until later to brush her teeth. Pt resting in recliner with quick release belt and call light in reach.  Therapy Documentation Precautions:  Precautions Precautions: Fall Precaution Comments: reports she fell two days before this admission hittng her head on the couch ("so it didn't hurt much) Restrictions Weight Bearing Restrictions: No  Pain: Pain Assessment Pain Assessment: No/denies pain ADL:  See FIM for current functional status  Therapy/Group: Individual Therapy  Girard 08/15/2014, 11:40 AM

## 2014-08-15 NOTE — Progress Notes (Signed)
Subjective/Complaints: No abd , no Nausea or vomiting Review of Systems - otherwise negative Objective: Vital Signs: Blood pressure 165/70, pulse 68, temperature 98.8 F (37.1 C), temperature source Oral, resp. rate 18, weight 71.215 kg (157 lb), SpO2 100 %. No results found. Results for orders placed or performed during the hospital encounter of 08/10/14 (from the past 72 hour(s))  CBC     Status: Abnormal   Collection Time: 08/12/14  9:50 AM  Result Value Ref Range   WBC 8.2 4.0 - 10.5 K/uL   RBC 2.89 (L) 3.87 - 5.11 MIL/uL   Hemoglobin 8.3 (L) 12.0 - 15.0 g/dL   HCT 24.7 (L) 36.0 - 46.0 %   MCV 85.5 78.0 - 100.0 fL   MCH 28.7 26.0 - 34.0 pg   MCHC 33.6 30.0 - 36.0 g/dL   RDW 17.1 (H) 11.5 - 15.5 %   Platelets 341 150 - 400 K/uL     HEENT: normal Cardio: RRR and no murmur, occ PAC Resp: CTA B/L and unlabored GI: BS positive and NT, ND Extremity:  Pulses positive and No Edema Skin:   Intact Neuro: Alert/Oriented, Normal Sensory and Abnormal Motor 4/5 bilateral delt, bi, tri, grip, HF, KE, ADF Musc/Skel:  Normal Gen NAD   Assessment/Plan: 1. Functional deficits secondary to severe deconditioning after GI bleed/ABLA which require 3+ hours per day of interdisciplinary therapy in a comprehensive inpatient rehab setting. Physiatrist is providing close team supervision and 24 hour management of active medical problems listed below. Physiatrist and rehab team continue to assess barriers to discharge/monitor patient progress toward functional and medical goals. FIM: FIM - Bathing Bathing Steps Patient Completed: Chest, Right Arm, Left Arm, Abdomen, Front perineal area, Buttocks Bathing: 5: Supervision: Safety issues/verbal cues  FIM - Upper Body Dressing/Undressing Upper body dressing/undressing steps patient completed: Thread/unthread right sleeve of pullover shirt/dresss, Put head through opening of pull over shirt/dress, Pull shirt over trunk Upper body  dressing/undressing: 5: Supervision: Safety issues/verbal cues FIM - Lower Body Dressing/Undressing Lower body dressing/undressing steps patient completed: Thread/unthread right underwear leg, Thread/unthread left underwear leg, Pull underwear up/down, Thread/unthread right pants leg, Thread/unthread left pants leg, Pull pants up/down, Don/Doff right sock, Don/Doff left sock Lower body dressing/undressing: 4: Min-Patient completed 75 plus % of tasks  FIM - Toileting Toileting steps completed by patient: Adjust clothing prior to toileting, Adjust clothing after toileting, Performs perineal hygiene Toileting Assistive Devices: Grab bar or rail for support Toileting: 5: Supervision: Safety issues/verbal cues  FIM - Radio producer Devices: Grab bars, Insurance account manager Transfers: 5-To toilet/BSC: Supervision (verbal cues/safety issues), 5-From toilet/BSC: Supervision (verbal cues/safety issues)  FIM - Control and instrumentation engineer Devices: Arm rests, Copy: 4: Bed > Chair or W/C: Min A (steadying Pt. > 75%), 5: Chair or W/C > Bed: Supervision (verbal cues/safety issues)  FIM - Locomotion: Wheelchair Locomotion: Wheelchair: 0: Activity did not occur FIM - Locomotion: Ambulation Locomotion: Ambulation Assistive Devices: Administrator Ambulation/Gait Assistance: 4: Min guard Locomotion: Ambulation: 4: Travels 150 ft or more with minimal assistance (Pt.>75%)  Comprehension Comprehension Mode: Auditory Comprehension: 4-Understands basic 75 - 89% of the time/requires cueing 10 - 24% of the time  Expression Expression Mode: Verbal Expression: 4-Expresses basic 75 - 89% of the time/requires cueing 10 - 24% of the time. Needs helper to occlude trach/needs to repeat words.  Social Interaction Social Interaction: 6-Interacts appropriately with others with medication or extra time (anti-anxiety, antidepressant).  Problem  Solving Problem Solving: 4-Solves  basic 75 - 89% of the time/requires cueing 10 - 24% of the time  Memory Memory: 4-Recognizes or recalls 75 - 89% of the time/requires cueing 10 - 24% of the time  Medical Problem List and Plan: 1. Functional deficits secondary to severe deconditioning related to upper GI bleed/Anemia/Course complicated by Delerium. 2.  DVT Prophylaxis/Anticoagulation: SCD,s 3. Pain Management: tylenol as needed 4. Hypertension/PSVT.Lopressor 37.5 mg bid. HR and BP controlled at present[[watch for bradycardia-- adjust as indicated with increased activity 5. Neuropsych: This patient is capable of making basic decisions on her own behalf. Cognition improving 6. Skin/Wound Care: Routine skin checks. Skin intact presently. 7. Fluids/Electrolytes/Nutrition: Strict I and O in follow-up chemistries in AM. 8. GIB/Anemia: goal to keep hgb greater than 7. Received 1u prbc 3/8.  s -no colonoscopy or interventions given age -check Hgb in am    LOS (Days) 5 A FACE TO FACE EVALUATION WAS PERFORMED  [name] E 08/15/2014, 7:10 AM

## 2014-08-15 NOTE — Progress Notes (Signed)
Physical Therapy Session Note  Patient Details  Name: [name] MRN: [id number] Date of Birth: Jun 17, 1921  Today's Date: 08/15/2014 PT Individual Time: 0800-0900;  1100-1205 PT Individual Time Calculation (min): 60 min; 65 min   Short Term Goals: Week 1:  PT Short Term Goal 1 (Week 1): STG's = LTG's due to ELOS     Skilled Therapeutic Interventions/Progress Updates:    Tx 1 (am): Pt received EOB, agreeable to therapy. No c/o pain or dizziness throughout session during mobility. Treatment focused on functional mobility via gait training, assessment with various ADs to assess discharge recommendations, and home safety due to high falls risk. After grooming at sink for functional dynamic standing, pt c/o urinary urgency and transferred to toilet with 1 person hha (see FIM). Session continued in simulated apartment. Pt ambulated 2 x 100' with RW and Min guard, VCs for technique and safety, over tiled hallway and in functional home context over carpet; pt demonstrates recall of RW training with upright posture but with marked difficulty problem solving mechanical opening/closing of RW. Blocked practice of sit><stand transfers, with and without RW, on surfaces of various heights and compliance; Pt is Min A with transfers overall. Therapeutic activity for transfers, grooming at sink, furniture transfers, bed mobility in simulated ADL apartment. Gait training for functional ambulation, with and without RW, across tile and carpet, thresholds, side-stepping at kitchen counter. Pt also engaged in cognitive problem-solving tasks throughout session in context of functional mobility. Pt tolerated treatment well, motivated to work on her balance to prevent more falls at home.   Tx 2 (am 2): Pt received in recliner with quick release belt, agreeable to therapy. No subjective c/o increased pain, but upon probing from PT, pt states that she has R knee pain (as below). Pt's granddaughter, Louretta Shorten, who states that  she is HCPOA, present initially and gave PT report on home entry and current family plan. Pt educated on importance of hydration, despite urinary urgency. Tx focused on RLE strengthening for improved functional balance training and reinforcement of safety techniques during transfers. Tasks for motor and spatial problem-solving used throughout session to facilitate cognitive goals.  Therapeutic activity for basic transfers via stand-step with RW and S with VCs for safety, toilet transfer (see FIM). Pt with recall of safe hand placement as emphasized in morning session.  Gait training with RW and trial of ACE wrap for R knee: Pt ambulated with RW 2 x 150 with Min guard, intermittent S. Gait with improvements in flexed trunk posture but heavy UE reliance, poor B foot clearance overall, antalgic gait due to RLE pain. PT donned ACE wrap to R knee to evaluate need for OA brace consult; Pt denies discomfort, but Ace wrap with no evidence of reduction in pain or improvement with safe ambulation. Gait velocity via 10MWT = 1.17 ft/sec with RW, "risk for recurrent falls."  Neuromuscular reeducation via forced use and multimodal cues for functional balance: Static balance training via single limb stance, 2 x 30 sec B, with BUE support at stairs; manual facilitation for active LE extension, posterior pelvic tilt and VCs for upright gaze.  NuStep x 10 min for LE strengthening and gentle knee ROM; visual cues via sticker on R knee for alignment. Seated knee extensions 2 x 10 and B hip adduction 1 x 10 with pillow squeeze, in unsupported sitting for seated postural control and core stability.  Pt returned to room and positioned in recliner with quick release belt for lunch. All needs within reach.  Pt tolerated treatment well. RN informed of her R knee pain, will follow up with meds.  Cont per PT POC. Continue to assess need for R knee OA brace, RW vs. SPC. Continue family education on D/C recommendations for 24 hour  supervision.  Therapy Documentation Precautions:  Precautions Precautions: Fall Precaution Comments: reports she fell two days before this admission hittng her head on the couch ("so it didn't hurt much) Restrictions Weight Bearing Restrictions: No Pain: Pain Assessment Pain Assessment: Faces Pain Score: 0-No pain Faces Pain Scale: Hurts little more Pain Type: Chronic pain Pain Location: Knee Pain Orientation: Right Pain Descriptors / Indicators: Grimacing;Discomfort;Aching Pain Onset: On-going Pain Intervention(s): RN made aware;Ambulation/increased activity;Repositioned;Splinting (PT donned ACE wrap) Locomotion : Ambulation Ambulation/Gait Assistance: 4: Min guard;5: Supervision Gait Gait velocity: 10 MWT = 1.17 ft/sec, "risk for recurrent falls"  Trunk/Postural Assessment : Thoracic Assessment Thoracic Assessment:  (scoliosis (premorbid), R convex) Lumbar Assessment Lumbar Assessment:  (scoliosis (premorbid), R convex)   See FIM for current functional status  Therapy/Group: Individual Therapy Blenda Mounts, SPT   Blenda Mounts 08/15/2014, 12:37 PM

## 2014-08-16 ENCOUNTER — Inpatient Hospital Stay (HOSPITAL_COMMUNITY): Payer: Medicare Other | Admitting: Occupational Therapy

## 2014-08-16 ENCOUNTER — Inpatient Hospital Stay (HOSPITAL_COMMUNITY): Payer: Medicare Other | Admitting: *Deleted

## 2014-08-16 LAB — HEMOGLOBIN AND HEMATOCRIT, BLOOD
HCT: 24.7 % — ABNORMAL LOW (ref 36.0–46.0)
Hemoglobin: 7.9 g/dL — ABNORMAL LOW (ref 12.0–15.0)

## 2014-08-16 NOTE — Progress Notes (Signed)
Occupational Therapy Session Note  Patient Details  Name: BRANDON SCARBROUGH MRN: [id number] Date of Birth: [birth year]  Today's Date: 08/16/2014 OT Individual Time: 1400-1500 OT Individual Time Calculation (min): 60 min   Skilled Therapeutic Interventions/Progress Updates:    Pt performed tub/shower transfers with min assist using the RW.  Pt continues to need min instructional cueing for use of the walker and to keep her head up as she tends to look at the floor, and at times ends up being too far back from the walker.  Worked on sit to stand transitions from various surfaces.  Pt tends to do better with more controlled sitting if she reaches back for the surface she is sitting on as opposed to reaching for the arms of the chair.  When reaching back for the arms and sitting her weight tends to go backwards faster, resulting in LOB posteriorly, and falling onto the surface without as much control.  Pt continues to need min assist for both sit to stand and stand to sit transitions.  Had pt work on standing balance in the kitchen for 8 mins while placing objects in upper cabinets and organizing them.  Pt needed min questioning cueing to position all items together appropriately.    Therapy Documentation Precautions:  Precautions Precautions: Fall Precaution Comments: Frequent faller, by self-report. Limited by R knee pain. Restrictions Weight Bearing Restrictions: No Other Position/Activity Restrictions: Caution with RLE due to R knee pain.  Pain: Pain Assessment Pain Assessment: No/denies pain ADL: See FIM for current functional status  Therapy/Group: Individual Therapy  Rainna Nearhood OTR/L 08/16/2014, 4:09 PM

## 2014-08-16 NOTE — Progress Notes (Signed)
Occupational Therapy Session Note  Patient Details  Name: [name] MRN: [id number] Date of Birth: 1921/07/31  Today's Date: 08/16/2014 OT Individual Time: 0805-0905 OT Individual Time Calculation (min): 60 min    Short Term Goals: No short term goals set  Skilled Therapeutic Interventions/Progress Updates:    Pt seen for BADL retraining with a focus on activity tolerance, balance, functional mobility and safety awareness with RW.  Pt opted to bathe at sink today. She ambulated around her room with several verbal cues on safe management of RW in relation to her body position. She was able to reach into low drawers to retreive clothing and no LOB. She then ambulated to sink and bathed, standing for part of the time and sitting in an armchair part of the time. Pt needs several cues for body mechanics to lower safely into sitting as she has poor eccentric control of sit to stand.   Donned clothing without A. Ambulated to w/c with cues as she places the RW too far in front of her. Pt taken to tub room to demonstrate how to transfer into a tub with a bench. Time did not allow for pt to practice the transfer, but she will be able to do this the next session.  Pt's PTs arrived for her next session.   Therapy Documentation Precautions:  Precautions Precautions: Fall Precaution Comments: Frequent faller, by self-report. Limited by R knee pain. Restrictions Weight Bearing Restrictions: No Other Position/Activity Restrictions: Caution with RLE due to R knee pain.      Pain: No c/o pain  ADL:  See FIM for current functional status  Therapy/Group: Individual Therapy  [place] 08/16/2014, 11:07 AM

## 2014-08-16 NOTE — Progress Notes (Signed)
Social Work Patient ID: [name], female   DOB: [birth year], 81 y.o.   MRN: [id number] Met with pt and daughter who was visiting to discuss team recommends 24 hr supervision at discharge.  Daughter reports they are talking about that and will make arrangements. Plan to meet with family and pt tomorrow after team conference at 75;30.  Pt is ok with this goal and reports she will do better at home in her own environment, but understands our concerns.

## 2014-08-16 NOTE — Progress Notes (Signed)
Physical Therapy Session Note  Patient Details  Name: [name] MRN: [id number] Date of Birth: March 19, 1922  Today's Date: 08/16/2014 PT Individual Time: 0900-1000 PT Individual Time Calculation (min): 60 min   Short Term Goals: Week 1:  PT Short Term Goal 1 (Week 1): STG's = LTG's due to ELOS     Skilled Therapeutic Interventions/Progress Updates:    Pt received via handoff from OT and propelled by PT to main therapy gym for session. Treatment focused on therex for core strengthening, gait training for assessment of RLE orthosis, and NMR for functional balance. Pt states that her main concern is her R knee, also back hurts, "i want both of them to get better."   Therapeutic exercise in unsupported sitting at therapy mat, reducing UE support, with exercises for core strengthening. Pt engaged in NuStep with BUEs and BLEs, level 4 >> 1 for gentle ROM and, x 8 min with no c/o increased R knee pain; visual cues via sticker for R knee for alignment. Seated forward rolling with large physioball 3 x 30 seconds was a good exercise for lumber extension for this pt.  Neuromuscular reeducation via forced use, reducing UE support, and multimodal cues including manual facilitation and mirror feedback for improved posture.   Gait training for trial of R knee OA brace; PT attempted to fit trial brace and observed gait x 20' with RW and RLE orthosis. PT observed no appreciable change in gait pattern or safety, due to poor fit. Pt reported feeling like it helped and that she would feel better with some support for R knee when she goes out into the community. For pain control, RLE orthosis might be worth pursuing a formal fitting.   Therapeutic activity via toilet transfers (see FIM), repetitions of sit><stand with VCs for safe hand placement and mat><w/c transfers via stand-step and stand-pivot, RW and 1 person hha/ Min A overall. Pt with recall of safe hand placement.   Pt returned to room with PT  propelling w/c for energy conservation, transfer to toilet with 1 person hha and grooming at sink all with S. Pt transferred sit>supine in bed with S, alarm active and all needs within reach. Cont per PT POC.  Therapy Documentation Precautions:  Precautions Precautions: Fall Precaution Comments: reports she fell two days PTA, hittng her head on the couch ("so it didn't hurt much") Restrictions Weight Bearing Restrictions: No  Pain: Pain Assessment Pain Assessment: 0-10 Pain Score: 1  Faces Pain Scale: Hurts little more Pain Type: Chronic pain Pain Location: Knee Pain Orientation: Right Pain Descriptors / Indicators: Aching;Constant;Discomfort;Grimacing Pain Onset: On-going Patients Stated Pain Goal: 0 Pain Intervention(s): Repositioned;Ambulation/increased activity;Elevated extremity Multiple Pain Sites: Yes 2nd Pain Site Wong-Baker Pain Rating: Hurts a little bit Pain Type: Chronic pain Pain Location: Back Pain Descriptors / Indicators: Constant;Sore;Discomfort;Grimacing Pain Frequency: Constant Pain Onset: On-going Pain Intervention(s): Repositioned;Ambulation/increased activity;Distraction;Environmental changes   Locomotion : Ambulation Ambulation: Yes Ambulation/Gait Assistance: 5: Supervision Ambulation Distance (Feet): 50 Feet Assistive device: Rolling walker;1 person hand held assist (trial of RLE orthosis) Ambulation/Gait Assistance Details: Verbal cues for precautions/safety;Verbal cues for gait pattern Stairs / Additional Locomotion Stairs: No Wheelchair Mobility Wheelchair Mobility: No  Trunk/Postural Assessment : Thoracic Assessment Thoracic Assessment: Exceptions to [facility] Lumbar Assessment Lumbar Assessment: Exceptions to [facility] Postural Control Postural Control: Deficits on evaluation   See FIM for current functional status  Therapy/Group: Individual Therapy Blenda Mounts, SPT   Blenda Mounts 08/16/2014, 11:20 AM

## 2014-08-16 NOTE — Progress Notes (Signed)
Subjective/Complaints: Good appetite, no issues with PT,OT  Review of Systems - otherwise negative Objective: Vital Signs: Blood pressure 151/62, pulse 65, temperature 98 F (36.7 C), temperature source Oral, resp. rate 17, weight 71.215 kg (157 lb), SpO2 100 %. No results found. Results for orders placed or performed during the hospital encounter of 08/10/14 (from the past 72 hour(s))  Hemoglobin and hematocrit, blood     Status: Abnormal   Collection Time: 08/16/14  6:00 AM  Result Value Ref Range   Hemoglobin 7.9 (L) 12.0 - 15.0 g/dL   HCT 24.7 (L) 36.0 - 46.0 %     HEENT: normal Cardio: RRR and no murmur, occ PAC Resp: CTA B/L and unlabored GI: BS positive and NT, ND Extremity:  Pulses positive and No Edema Skin:   Intact Neuro: Alert/Oriented, Normal Sensory and Abnormal Motor 4/5 bilateral delt, bi, tri, grip, HF, KE, ADF Musc/Skel:  Normal, good AROM bilateral shoulders Gen NAD   Assessment/Plan: 1. Functional deficits secondary to severe deconditioning after GI bleed/ABLA which require 3+ hours per day of interdisciplinary therapy in a comprehensive inpatient rehab setting. Physiatrist is providing close team supervision and 24 hour management of active medical problems listed below. Physiatrist and rehab team continue to assess barriers to discharge/monitor patient progress toward functional and medical goals. team conf in am FIM: FIM - Bathing Bathing Steps Patient Completed: Chest, Right Arm, Left Arm, Abdomen, Front perineal area, Buttocks, Right upper leg, Left upper leg, Left lower leg (including foot), Right lower leg (including foot) Bathing: 4: Steadying assist  FIM - Upper Body Dressing/Undressing Upper body dressing/undressing steps patient completed: Thread/unthread right sleeve of pullover shirt/dresss, Put head through opening of pull over shirt/dress, Pull shirt over trunk, Thread/unthread left sleeve of pullover shirt/dress Upper body  dressing/undressing: 5: Supervision: Safety issues/verbal cues FIM - Lower Body Dressing/Undressing Lower body dressing/undressing steps patient completed: Thread/unthread right underwear leg, Thread/unthread left underwear leg, Pull underwear up/down, Thread/unthread right pants leg, Thread/unthread left pants leg, Pull pants up/down, Don/Doff right sock, Don/Doff left sock, Don/Doff right shoe, Don/Doff left shoe Lower body dressing/undressing: 4: Steadying Assist  FIM - Toileting Toileting steps completed by patient: Adjust clothing prior to toileting, Adjust clothing after toileting, Performs perineal hygiene Toileting Assistive Devices: Grab bar or rail for support Toileting: 4: Steadying assist  FIM - Radio producer Devices: Grab bars, Elevated toilet seat, Insurance account manager Transfers: 4-To toilet/BSC: Min A (steadying Pt. > 75%), 4-From toilet/BSC: Min A (steadying Pt. > 75%)  FIM - Control and instrumentation engineer Devices: Arm rests, Copy: 4: Bed > Chair or W/C: Min A (steadying Pt. > 75%), 5: Chair or W/C > Bed: Supervision (verbal cues/safety issues)  FIM - Locomotion: Wheelchair Locomotion: Wheelchair: 0: Activity did not occur FIM - Locomotion: Ambulation Locomotion: Ambulation Assistive Devices: Administrator Ambulation/Gait Assistance: 4: Min guard, 5: Supervision Locomotion: Ambulation: 4: Travels 150 ft or more with minimal assistance (Pt.>75%)  Comprehension Comprehension Mode: Auditory Comprehension: 5-Follows basic conversation/direction: With no assist  Expression Expression Mode: Verbal Expression: 5-Expresses basic 90% of the time/requires cueing < 10% of the time.  Social Interaction Social Interaction: 6-Interacts appropriately with others with medication or extra time (anti-anxiety, antidepressant).  Problem Solving Problem Solving: 3-Solves basic 50 - 74% of the time/requires cueing 25 - 49%  of the time  Memory Memory: 4-Recognizes or recalls 75 - 89% of the time/requires cueing 10 - 24% of the time  Medical Problem List and Plan:  1. Functional deficits secondary to severe deconditioning related to upper GI bleed/Anemia/Course complicated by Delerium. 2.  DVT Prophylaxis/Anticoagulation: SCD,s 3. Pain Management: tylenol as needed 4. Hypertension/PSVT.Lopressor 37.5 mg bid. HR and BP controlled at present watch for bradycardia-- adjust as indicated with increased activity 5. Neuropsych: This patient is capable of making basic decisions on her own behalf. Cognition improving 6. Skin/Wound Care: Routine skin checks. Skin intact presently. 7. Fluids/Electrolytes/Nutrition: Strict I and O in follow-up chemistries in AM. 8. GIB/Anemia: goal to keep hgb greater than 7. Received 1u prbc 3/8.  s -no colonoscopy or interventions given age -Repeat Hgb stable, will monitor    LOS (Days) 6 A FACE TO FACE EVALUATION WAS PERFORMED  Michall Noffke E 08/16/2014, 7:15 AM

## 2014-08-17 ENCOUNTER — Inpatient Hospital Stay (HOSPITAL_COMMUNITY): Payer: Medicare Other

## 2014-08-17 ENCOUNTER — Inpatient Hospital Stay (HOSPITAL_COMMUNITY): Payer: Medicare Other | Admitting: Physical Therapy

## 2014-08-17 ENCOUNTER — Inpatient Hospital Stay (HOSPITAL_COMMUNITY): Payer: Medicare Other | Admitting: Occupational Therapy

## 2014-08-17 LAB — HEMOGLOBIN AND HEMATOCRIT, BLOOD
HEMATOCRIT: 27.4 % — AB (ref 36.0–46.0)
Hemoglobin: 8.6 g/dL — ABNORMAL LOW (ref 12.0–15.0)

## 2014-08-17 MED ORDER — DICLOFENAC SODIUM 1 % TD GEL
2.0000 g | Freq: Four times a day (QID) | TRANSDERMAL | Status: DC
  Administered 2014-08-17 – 2014-08-20 (×10): 2 g via TOPICAL
  Filled 2014-08-17: qty 100

## 2014-08-17 NOTE — Plan of Care (Signed)
Problem: RH Ambulation Goal: LTG Patient will ambulate in home environment (PT) LTG: Patient will ambulate in home environment, # of feet with assistance (PT).  Downgraded due to limited progress due to R knee pain.

## 2014-08-17 NOTE — Progress Notes (Signed)
Physical Therapy Session Note  Patient Details  Name: [name] MRN: [id number] Date of Birth: [birth year]  Today's Date: 08/17/2014 PT Individual Time: 1520-1600 PT Individual Time Calculation (min): 40 min   Short Term Goals: Week 1:  PT Short Term Goal 1 (Week 1): STG's = LTG's due to ELOS  Skilled Therapeutic Interventions/Progress Updates:   Pt received in recliner with nephew present to visit (not present to participate in family ed).  Continued discussion with pt about home entry and continued need to practice stairs for D/C. Pt reporting significant fatigue this pm and initially requesting to cancel therapy session.  Offered to transport pt to gym in w/c and then work on LE strengthening in the gym.  Pt continued to refuse secondary to fatigue.  Assessed pt's vitals and all WFL.  Again offered to perform LE strengthening exercises with patient in the room.  Pt agreed.  Performed standing LE strengthening and balance exercises with UE support on RW with one sitting rest break.  Performed one set x 8 reps each LE: heel raises, hip flexion marches, hip ABD, HS curls, and hip extension. Pt did require hands on facilitation and verbal cues for safe and effective technique.  Pt returned to recliner with w/c cushion added to elevate seat height (pt c/o pain in R knee upon standing from low recliner surface) at end of session.  Pt left with quick release belt in place and all items within reach.    Therapy Documentation Precautions:  Precautions Precautions: Fall Precaution Comments: Frequent faller, by self-report. Limited by R knee pain. Restrictions Weight Bearing Restrictions: No Other Position/Activity Restrictions: Caution with RLE due to R knee pain. General: PT Amount of Missed Time (min): 20 Minutes PT Missed Treatment Reason: Patient fatigue Vital Signs: Therapy Vitals Pulse Rate: 64 BP: 133/60 mmHg Patient Position (if appropriate): Sitting Oxygen Therapy SpO2: 100  % O2 Device: Not Delivered Pain: Pain Assessment Pain Assessment: No/denies pain Faces Pain Scale: Hurts little more Pain Type: Chronic pain Pain Location: Knee Pain Orientation: Right Pain Descriptors / Indicators: Grimacing;Nagging;Tiring;Constant Pain Onset: On-going Pain Intervention(s): Repositioned;RN made aware;Ambulation/increased activity Locomotion : Ambulation Ambulation/Gait Assistance: 4: Min guard   See FIM for current functional status  Therapy/Group: Individual Therapy  Raylene Everts [facility] 08/17/2014, 4:17 PM

## 2014-08-17 NOTE — Plan of Care (Signed)
Problem: RH Bed Mobility Goal: LTG Patient will perform bed mobility with assist (PT) LTG: Patient will perform bed mobility with assistance, with/without cues (PT).  Downgraded due to need for 24hr S at D/C.

## 2014-08-17 NOTE — Patient Care Conference (Signed)
Inpatient RehabilitationTeam Conference and Plan of Care Update Date: 08/17/2014   Time: 11;15 AM    Patient Name: [name]      Medical Record Number: [id number]  Date of Birth: 1922/01/24 Sex: Female         Room/Bed: 4M04C/4M04C-01 Payor Info: Payor: MEDICARE / Plan: MEDICARE PART A AND B / Product Type: *No Product type* /    Admitting Diagnosis: GI bleed  Admit Date/Time:  08/10/2014  6:08 PM Admission Comments: No comment available   Primary Diagnosis:  Debilitated Principal Problem: Debilitated  Patient Active Problem List   Diagnosis Date Noted  . Debilitated 08/10/2014  . Physical deconditioning   . Hyperkalemia   . Acute blood loss anemia 08/04/2014  . Bleeding gastrointestinal   . Rectal bleeding 08/03/2014  . GI bleed 08/03/2014  . Melena 08/03/2014  . Protein-calorie malnutrition, severe 02/24/2014  . PSVT  02/23/2014  . History of GI bleed- Aug 2014 (NSAIDs) 02/23/2014  . SVT (supraventricular tachycardia) 02/23/2014  . DJD (degenerative joint disease) 04/07/2013  . Breast cancer- s/p mastectomy 03/11/2013  . GERD 03/10/2008  . GLAUCOMA 09/30/2007  . Essential hypertension 09/30/2007  . DEGENERATIVE JOINT DISEASE, LUMBAR SPINE 09/30/2007    Expected Discharge Date: Expected Discharge Date: 08/20/14  Team Members Present: Physician leading conference: Dr. Alysia Penna Social Worker Present: Ovidio Kin, LCSW Nurse Present: Heather Roberts, RN PT Present: Georjean Mode, PT;Blair Hobble, PT OT Present: Simonne Come, Dorothyann Gibbs, OT SLP Present: Windell Moulding, SLP PPS Coordinator present : Daiva Nakayama, RN, CRRN     Current Status/Progress Goal Weekly Team Focus  Medical   No evidence of active GI bleeding, good activity tolerance  Maintain medical stability during rehabilitation stay  Discharge planning   Bowel/Bladder   Continent of bowel and bladder  patient will remain continent of bowel and bladder  Educate patient on signs and symptoms of  constipation and UTI   Swallow/Nutrition/ Hydration     na        ADL's   supervision with all self care, occasional steady A with transfers  supervision with tub bench transfer, bathing, and homemaking; mod I with dressing, toileting, toilet transfer (24hr S recommended)  activity tolerance, functional mobility, balance, safety awareness with RW   Mobility   Min guard/ intermittent S, considering R knee OA brace for pain control  S, intermittent Min A; D/C to home with 24 hr S  D/C planning, car transfer, family ed (met Louretta Shorten, granddaughter, HCPOA)   Communication     na        Safety/Cognition/ Behavioral Observations    no unsafe behaviors        Pain   Patient is having right knee pain with swelling from knee to ankle at times. Refusing tylenol and muscle cream  Keep patient pain below at 3  encourage patient to apply muscle cream and take tylenol before activity.   Skin   Skin is dry and intact. No breakdown  Continue to be free from breakdown  continue to educate patient on the importance of repositioning and skin care.      *See Care Plan and progress notes for long and short-term goals.  Barriers to Discharge: Elderly    Possible Resolutions to Barriers:  Involved family/caregivers in training    Discharge Planning/Teaching Needs:  Home with family on working to provide 24 hr supervision to pt.  Informed of team's recommendation of 24 hr supervision due to cognition issues  Team Discussion:  Knee issue causes her pain and may need a sleeve for support. Medically better-hemoglobin normal range.  Dizziness better, but recommendation is 24 hr supervision for safety and cognition. Doing well.  X-ray orderd  Revisions to Treatment Plan:  None   Continued Need for Acute Rehabilitation Level of Care: The patient requires daily medical management by a physician with specialized training in physical medicine and rehabilitation for the following conditions: Daily direction  of a multidisciplinary physical rehabilitation program to ensure safe treatment while eliciting the highest outcome that is of practical value to the patient.: Yes Daily medical management of patient stability for increased activity during participation in an intensive rehabilitation regime.: Yes Daily analysis of laboratory values and/or radiology reports with any subsequent need for medication adjustment of medical intervention for : Other  [name] 08/17/2014, 1:35 PM

## 2014-08-17 NOTE — Plan of Care (Signed)
Problem: RH Stairs Goal: LTG Patient will ambulate up and down stairs w/assist (PT) LTG: Patient will ambulate up and down # of stairs with assistance (PT)  Downgraded due to limited progress due to R knee pain.

## 2014-08-17 NOTE — Plan of Care (Signed)
Problem: RH Balance Goal: LTG Patient will maintain dynamic standing balance (PT) LTG: Patient will maintain dynamic standing balance with assistance during mobility activities (PT)  Downgraded due to need for 24 hr S upon D/C.

## 2014-08-17 NOTE — Plan of Care (Signed)
Problem: RH Bed to Chair Transfers Goal: LTG Patient will perform bed/chair transfers w/assist (PT) LTG: Patient will perform bed/chair transfers with assistance, with/without cues (PT).  Downgraded due to limited progress due to R knee pain.

## 2014-08-17 NOTE — Progress Notes (Signed)
Subjective/Complaints: No BM yesterday or today, no abd pain    Review of Systems - otherwise negative Objective: Vital Signs: Blood pressure 171/70, pulse 73, temperature 98.4 F (36.9 C), temperature source Oral, resp. rate 17, weight 71.215 kg (157 lb), SpO2 100 %. No results found. Results for orders placed or performed during the hospital encounter of 08/10/14 (from the past 72 hour(s))  Hemoglobin and hematocrit, blood     Status: Abnormal   Collection Time: 08/16/14  6:00 AM  Result Value Ref Range   Hemoglobin 7.9 (L) 12.0 - 15.0 g/dL   HCT 24.7 (L) 36.0 - 46.0 %     HEENT: normal Cardio: RRR and no murmur, occ PAC Resp: CTA B/L and unlabored GI: BS positive and NT, ND Extremity:  Pulses positive and No Edema Skin:   Intact Neuro: Alert/Oriented, Normal Sensory and Abnormal Motor 4/5 bilateral delt, bi, tri, grip, HF, KE, ADF Musc/Skel:  Normal, good AROM bilateral shoulders Gen NAD   Assessment/Plan: 1. Functional deficits secondary to severe deconditioning after GI bleed/ABLA which require 3+ hours per day of interdisciplinary therapy in a comprehensive inpatient rehab setting. Physiatrist is providing close team supervision and 24 hour management of active medical problems listed below. Physiatrist and rehab team continue to assess barriers to discharge/monitor patient progress toward functional and medical goals. Team conference today please see physician documentation under team conference tab, met with team face-to-face to discuss problems,progress, and goals. Formulized individual treatment plan based on medical history, underlying problem and comorbidities. FIM: FIM - Bathing Bathing Steps Patient Completed: Chest, Right Arm, Left Arm, Abdomen, Front perineal area, Buttocks, Right upper leg, Left upper leg, Left lower leg (including foot), Right lower leg (including foot) Bathing: 5: Supervision: Safety issues/verbal cues  FIM - Upper Body  Dressing/Undressing Upper body dressing/undressing steps patient completed: Thread/unthread right sleeve of pullover shirt/dresss, Put head through opening of pull over shirt/dress, Pull shirt over trunk, Thread/unthread left sleeve of pullover shirt/dress Upper body dressing/undressing: 5: Set-up assist to: Obtain clothing/put away FIM - Lower Body Dressing/Undressing Lower body dressing/undressing steps patient completed: Thread/unthread right underwear leg, Thread/unthread left underwear leg, Pull underwear up/down, Thread/unthread right pants leg, Thread/unthread left pants leg, Pull pants up/down, Don/Doff right sock, Don/Doff left sock, Don/Doff right shoe, Don/Doff left shoe, Fasten/unfasten right shoe, Fasten/unfasten left shoe Lower body dressing/undressing: 5: Supervision: Safety issues/verbal cues  FIM - Toileting Toileting steps completed by patient: Adjust clothing prior to toileting, Performs perineal hygiene, Adjust clothing after toileting Toileting Assistive Devices: Grab bar or rail for support Toileting: 4: Steadying assist  FIM - Radio producer Devices: Elevated toilet seat, Grab bars Toilet Transfers: 5-From toilet/BSC: Supervision (verbal cues/safety issues), 5-To toilet/BSC: Supervision (verbal cues/safety issues)  FIM - Control and instrumentation engineer Devices: Arm rests, Copy: 5: Sit > Supine: Supervision (verbal cues/safety issues), 4: Chair or W/C > Bed: Min A (steadying Pt. > 75%), 4: Bed > Chair or W/C: Min A (steadying Pt. > 75%)  FIM - Locomotion: Wheelchair Locomotion: Wheelchair: 1: Total Assistance/staff pushes wheelchair (Pt<25%) FIM - Locomotion: Ambulation Locomotion: Ambulation Assistive Devices: Administrator, Orthosis (with trials of 1 person hha) Ambulation/Gait Assistance: 5: Supervision Locomotion: Ambulation: 1: Travels less than 50 ft with minimal assistance  (Pt.>75%)  Comprehension Comprehension Mode: Auditory Comprehension: 5-Understands complex 90% of the time/Cues < 10% of the time  Expression Expression Mode: Verbal Expression: 5-Expresses basic 90% of the time/requires cueing < 10% of the time.  Social  Interaction Social Interaction: 6-Interacts appropriately with others with medication or extra time (anti-anxiety, antidepressant).  Problem Solving Problem Solving: 3-Solves basic 50 - 74% of the time/requires cueing 25 - 49% of the time  Memory Memory: 4-Recognizes or recalls 75 - 89% of the time/requires cueing 10 - 24% of the time  Medical Problem List and Plan: 1. Functional deficits secondary to severe deconditioning related to upper GI bleed/Anemia/Course complicated by Delerium. 2.  DVT Prophylaxis/Anticoagulation: SCD,s 3. Pain Management: tylenol as needed 4. Hypertension/PSVT.Lopressor 37.5 mg bid. HR and BP controlled at present watch for bradycardia-- adjust as indicated with increased activity 5. Neuropsych: This patient is capable of making basic decisions on her own behalf. Cognition improving 6. Skin/Wound Care: Routine skin checks. Skin intact presently. 7. Fluids/Electrolytes/Nutrition: Strict I and O in follow-up chemistries in AM. 8. GIB/Anemia: goal to keep hgb greater than 7. Received 1u prbc 3/8.   -no colonoscopy or interventions given age -Repeat Hgb stable, will monitor for decline    LOS (Days) 7 A FACE TO FACE EVALUATION WAS PERFORMED  KIRSTEINS,ANDREW E 08/17/2014, 7:39 AM

## 2014-08-17 NOTE — Progress Notes (Signed)
Physical Therapy Session Note  Patient Details  Name: [name] MRN: [id number] Date of Birth: 08/18/1921  Today's Date: 08/17/2014 PT Individual Time: 1300-1400 PT Individual Time Calculation (min): 60 min   Short Term Goals: Week 1:  PT Short Term Goal 1 (Week 1): STG's = LTG's due to ELOS  Skilled Therapeutic Interventions/Progress Updates:    Pt received in room after xray of R knee. Daughter [name] and Psychologist, educational Louretta Shorten present for session. Pt with c/o R knee pain as main limiting factor to treatment. Treatment to focus on pain management, pt education, transfers and functional mobility.   Self care for pt and family education on pain control for arthritis, including OTC knee compression sleeves/brace, elevating LEs in recliner, and requesting meds from RN, therapy, or family.   Therapeutic activity via basic, furniture and car transfers and family with return demo of safe guarding techniques and VCs. Pt's transfers are S overall, occasional Min A to facilitate sit > stand from low, compliant surfaces.   Gait training for pt ambulation x 100' with RW and Min guard and 3 stairs ascending/descending with 1 rail with Min A. Family return demo'd safe guarding techniques during ambulation and verbalized understanding of stairs. Will continue to reinforce family training for stairs.   Pt with increase in pain and lethargy today, evidenced by behaviors but pt is stoic and uncomplaining.   Plan to D/C on Saturday. Cont per PT POC. Continue to progress family training.  Therapy Documentation Precautions:  Precautions Precautions: Fall Precaution Comments: Frequent faller, by self-report. Limited by R knee pain. Restrictions Weight Bearing Restrictions: No Other Position/Activity Restrictions: Caution with RLE due to R knee pain.  Pain: Pain Assessment Pain Assessment: No/denies pain Faces Pain Scale: Hurts little more Pain Type: Chronic pain Pain Location: Knee Pain  Orientation: Right Pain Descriptors / Indicators: Grimacing;Nagging;Tiring;Constant Pain Onset: On-going Pain Intervention(s): Repositioned;RN made aware;Ambulation/increased activity  See FIM for current functional status  Therapy/Group: Individual Therapy Blenda Mounts, SPT  Blenda Mounts 08/17/2014, 2:41 PM

## 2014-08-17 NOTE — Progress Notes (Signed)
Social Work Barbara Hashimoto, LCSW Social Worker Signed  Patient Care Conference 08/17/2014  1:35 PM    Expand All Collapse All   Inpatient RehabilitationTeam Conference and Plan of Care Update Date: 08/17/2014   Time: 11;15 AM     Patient Name: [name]       Medical Record Number: [id number]  Date of Birth: 1921/12/03 Sex: Female         Room/Bed: 4M04C/4M04C-01 Payor Info: Payor: MEDICARE / Plan: MEDICARE PART A AND B / Product Type: *No Product type* /    Admitting Diagnosis: GI bleed   Admit Date/Time:  08/10/2014  6:08 PM Admission Comments: No comment available   Primary Diagnosis:  Debilitated Principal Problem: Debilitated    Patient Active Problem List     Diagnosis  Date Noted   .  Debilitated  08/10/2014   .  Physical deconditioning     .  Hyperkalemia     .  Acute blood loss anemia  08/04/2014   .  Bleeding gastrointestinal     .  Rectal bleeding  08/03/2014   .  GI bleed  08/03/2014   .  Melena  08/03/2014   .  Protein-calorie malnutrition, severe  02/24/2014   .  PSVT   02/23/2014   .  History of GI bleed- Aug 2014 (NSAIDs)  02/23/2014   .  SVT (supraventricular tachycardia)  02/23/2014   .  DJD (degenerative joint disease)  04/07/2013   .  Breast cancer- s/p mastectomy  03/11/2013   .  GERD  03/10/2008   .  GLAUCOMA  09/30/2007   .  Essential hypertension  09/30/2007   .  DEGENERATIVE JOINT DISEASE, LUMBAR SPINE  09/30/2007     Expected Discharge Date: Expected Discharge Date: 08/20/14  Team Members Present: Physician leading conference: Dr. Alysia [name] Social Worker Present: Barbara Kin, LCSW Nurse Present: Barbara Roberts, RN PT Present: [name], PT;[name], PT OT Present: [name], [name], OT SLP Present: [name], SLP PPS Coordinator present : Barbara Nakayama, RN, CRRN        Current Status/Progress  Goal  Weekly Team Focus   Medical     No evidence of active GI bleeding, good activity tolerance   Maintain medical  stability during rehabilitation stay  Discharge planning   Bowel/Bladder     Continent of bowel and bladder  patient will remain continent of bowel and bladder   Educate patient on signs and symptoms of constipation and UTI    Swallow/Nutrition/ Hydration       na         ADL's     supervision with all self care, occasional steady A with transfers  supervision with tub bench transfer, bathing, and homemaking; mod I with dressing, toileting, toilet transfer (24hr S recommended)  activity tolerance, functional mobility, balance, safety awareness with RW   Mobility     Min guard/ intermittent S, considering R knee OA brace for pain control  S, intermittent Min A; D/C to home with 24 hr S  D/C planning, car transfer, family ed (met [name], granddaughter, HCPOA)   Communication       na         Safety/Cognition/ Behavioral Observations      no unsafe behaviors         Pain     Patient is having right knee pain with swelling from knee to ankle at times. Refusing tylenol and muscle cream  Keep patient  pain below at 3  encourage patient to apply muscle cream and take tylenol before activity.   Skin     Skin is dry and intact. No breakdown   Continue to be free from breakdown   continue to educate patient on the importance of repositioning and skin care.      *See Care Plan and progress notes for long and short-term goals.    Barriers to Discharge:  Elderly     Possible Resolutions to Barriers:   Involved family/caregivers in training      Discharge Planning/Teaching Needs:   Home with family on working to provide 24 hr supervision to pt.  Informed of team's recommendation of 24 hr supervision due to cognition issues       Team Discussion:    Knee issue causes her pain and may need a sleeve for support. Medically better-hemoglobin normal range.  Dizziness better, but recommendation is 24 hr supervision for safety and cognition. Doing well.  X-ray orderd   Revisions to Treatment Plan:     None    Continued Need for Acute Rehabilitation Level of Care: The patient requires daily medical management by a physician with specialized training in physical medicine and rehabilitation for the following conditions: Daily direction of a multidisciplinary physical rehabilitation program to ensure safe treatment while eliciting the highest outcome that is of practical value to the patient.: Yes Daily medical management of patient stability for increased activity during participation in an intensive rehabilitation regime.: Yes Daily analysis of laboratory values and/or radiology reports with any subsequent need for medication adjustment of medical intervention for : Other  [name] 08/17/2014, 1:35 PM                  Patient ID: [name], female   DOB: 12-28-1921, 81 y.o.   MRN: [id number]

## 2014-08-17 NOTE — Progress Notes (Signed)
Social Work Patient ID: [name], female   DOB: 05/05/1922, 81 y.o.   MRN: [id number] Met with pt, daughter and grandaughter to inform team conference goals of supervision level and discharge 3/19.  Both plan to go through PT with pt today. They plan to have someone with her at home when first discharged and to go from there.  They informed PT that pt does not fall all of the time at home. Will arrange follow up therapies and get any equipment needed.  Work toward discharge on Sat.

## 2014-08-17 NOTE — Plan of Care (Signed)
Problem: RH Ambulation Goal: LTG Patient will ambulate in controlled environment (PT) LTG: Patient will ambulate in a controlled environment, # of feet with assistance (PT).  Downgraded due to limited progress due to R knee pain.

## 2014-08-17 NOTE — Progress Notes (Signed)
Occupational Therapy Session Note  Patient Details  Name: [name] MRN: [id number] Date of Birth: 1921/10/29  Today's Date: 08/17/2014 OT Individual Time: 0906-1002 OT Individual Time Calculation (min): 56 min    Short Term Goals: No short term goals set  Skilled Therapeutic Interventions/Progress Updates:      Pt seen for BADL retraining of toileting, bathing, and dressing with a focus on activity tolerance, balance, and safety awareness. Pt's son and dtr in law in room at start of session. They were able to observe her bed mobility, bed and toilet transfers and ambulation within the room using the RW.  Discussed with them DME needs. Demonstrated to pt's son how a tub bench is set up in a tub and he will evaluate the bathroom to ensure that equipment will work at home.  Discussed the need for pt to have 24 hr Supervision at home.  Pt demonstrated good standing balance with reaching into the low drawers to retreive her clothing.   She completed toileting and dressing with mod I and bathing with S. Pt had edema in R foot, discussed with nurse about having an order for TED hose.  She put the order in and large long knee highs applied to pt, who stated that they felt comfortable.  Pt transferred to recliner. Feet elevated, quick release belt applied, and call light in reach.  Therapy Documentation Precautions:  Precautions Precautions: Fall Precaution Comments: Frequent faller, by self-report. Limited by R knee pain. Restrictions Weight Bearing Restrictions: No Other Position/Activity Restrictions: Caution with RLE due to R knee pain.  Pain: Pain Assessment Pain Assessment: No/denies pain ADL:  See FIM for current functional status  Therapy/Group: Individual Therapy  [place] 08/17/2014, 10:39 AM

## 2014-08-17 NOTE — Plan of Care (Signed)
Problem: RH Pre-functional (Specify) Goal: RH LTG Pre-functional (Specify) Outcome: Not Applicable Date Met:  47/09/62 Discontinued to due clarification of home entry, which does not include ramp.

## 2014-08-17 NOTE — Progress Notes (Deleted)
Physical Therapy Session Note  Patient Details  Name: [name] MRN: [id number] Date of Birth: 1921/10/06  Today's Date: 08/17/2014 PT Individual Time: 1520-1600 PT Individual Time Calculation (min): 40 min   Short Term Goals: Week 1:  PT Short Term Goal 1 (Week 1): STG's = LTG's due to ELOS  Skilled Therapeutic Interventions/Progress Updates:    Pt received in room after xray of R knee. Daughter [name] and Psychologist, educational Louretta Shorten present for session. Pt with c/o R knee pain as main limiting factor to treatment. Treatment to focus on pain management, pt education, transfers and functional mobility.   Self care for pt and family education on pain control for arthritis, including OTC knee compression sleeves/brace, elevating LEs in recliner, and requesting meds from RN, therapy, or family.   Therapeutic activity via basic, furniture and car transfers and family with return demo of safe guarding techniques and VCs. Pt's transfers are S overall, occasional Min A to facilitate sit > stand from low, compliant surfaces.   Gait training for pt ambulation x 100' with RW and Min guard and 3 stairs ascending/descending with 1 rail with Min A. Family return demo'd safe guarding techniques during ambulation and verbalized understanding of stairs. Will continue to reinforce family training for stairs.   Pt with increase in pain and lethargy today, evidenced by behaviors but pt is stoic and uncomplaining.   Plan to D/C on Saturday. Cont per PT POC. Continue to progress family training.  Therapy Documentation Precautions:  Precautions Precautions: Fall Precaution Comments: Frequent faller, by self-report. Limited by R knee pain. Restrictions Weight Bearing Restrictions: No Other Position/Activity Restrictions: Caution with RLE due to R knee pain. Therapy Vitals Pulse Rate: 64 BP: 133/60 mmHg Patient Position (if appropriate): Sitting Oxygen Therapy SpO2: 100 % O2 Device: Not  Delivered Pain: Pain Assessment Pain Assessment: No/denies pain Faces Pain Scale: Hurts little more Pain Type: Chronic pain Pain Location: Knee Pain Orientation: Right Pain Descriptors / Indicators: Grimacing;Nagging;Tiring;Constant Pain Onset: On-going Pain Intervention(s): Repositioned;RN made aware;Ambulation/increased activity  See FIM for current functional status  Therapy/Group: Individual Therapy Blenda Mounts, SPT  Breland Trouten 08/17/2014, 5:21 PM

## 2014-08-18 ENCOUNTER — Inpatient Hospital Stay (HOSPITAL_COMMUNITY): Payer: Medicare Other | Admitting: Rehabilitation

## 2014-08-18 ENCOUNTER — Inpatient Hospital Stay (HOSPITAL_COMMUNITY): Payer: Medicare Other

## 2014-08-18 ENCOUNTER — Inpatient Hospital Stay (HOSPITAL_COMMUNITY): Payer: Medicare Other | Admitting: Occupational Therapy

## 2014-08-18 MED ORDER — BOOST / RESOURCE BREEZE PO LIQD: 1.0000 | Freq: Every day | ORAL | Status: DC

## 2014-08-18 NOTE — Progress Notes (Signed)
INITIAL NUTRITION ASSESSMENT  DOCUMENTATION CODES Per approved criteria  -Not Applicable   INTERVENTION: Provide Resource Breeze po once daily, each supplement provides 250 kcal and 9 grams of protein.  NUTRITION DIAGNOSIS: Increased nutrient needs related to therpy as evidenced by estimated nutrition needs.   Goal: Pt to meet >/= 90% of their estimated nutrition needs   Monitor:  PO intake, weight trends, labs, I/O's  Reason for Assessment: MST  81 y.o. female  Admitting Dx: Debilitated  ASSESSMENT: Pt with history of PSVT maintained on aspirin 81 mg daily, upper GI bleed. maintained on aspirin 81 mg daily, upper GI bleed August 2014. Presented 08/03/2014 with generalized weakness. CT of abdomen and pelvis showed bilateral internal iliac artery aneurysms with considerable mural thrombus.  Pt reports having a good appetite currently and PTA at home eating at least 2 meals a day and a snack. Pt reports usual body weight of ~150 lbs. Current meal completion is 100%. Pt requests Resource Breeze and she had consumed them PTA. RD to order.  Unable to perform nutrition focused physical exam. Will perform during next visit.  Labs and medications reviewed.  Height: Ht Readings from Last 1 Encounters:  08/06/14 5\' 8"  (1.727 m)    Weight: Wt Readings from Last 1 Encounters:  08/12/14 157 lb (71.215 kg)    Ideal Body Weight: 140 lbs  % Ideal Body Weight: 112%  Wt Readings from Last 10 Encounters:  08/12/14 157 lb (71.215 kg)  08/10/14 161 lb 3.2 oz (73.12 kg)  07/25/14 151 lb 1.9 oz (68.548 kg)  04/26/14 160 lb 3.2 oz (72.666 kg)  03/31/14 162 lb 3.2 oz (73.573 kg)  03/10/14 168 lb (76.204 kg)  02/25/14 163 lb 9.3 oz (74.2 kg)  02/09/14 164 lb (74.39 kg)  08/11/13 167 lb 6.4 oz (75.932 kg)  04/06/13 168 lb 12.8 oz (76.567 kg)    Usual Body Weight: 150 lbs  % Usual Body Weight: 105%  BMI:  Body mass index is 23.88 kg/(m^2).  Estimated Nutritional Needs: Kcal:  1700-1900 Protein: 80-95 gams Fluid: 1.7 - 1.9 L/day  Skin: +2 edema on RLE, non-pitting LLE edema  Diet Order: Diet regular  EDUCATION NEEDS: -No education needs identified at this time   Intake/Output Summary (Last 24 hours) at 08/18/14 0953 Last data filed at 08/18/14 0900  Gross per 24 hour  Intake    840 ml  Output      0 ml  Net    840 ml    Last BM: 3/13  Labs:  No results for input(s): NA, K, CL, CO2, BUN, CREATININE, CALCIUM, MG, PHOS, GLUCOSE in the last 168 hours.  CBG (last 3)  No results for input(s): GLUCAP in the last 72 hours.  Scheduled Meds: . diclofenac sodium  2 g Topical QID  . latanoprost  1 drop Both Eyes QHS  . metoprolol tartrate  37.5 mg Oral BID  . pantoprazole  40 mg Oral BID    Continuous Infusions:   Past Medical History  Diagnosis Date  . Hypertension   . Cancer 2011, 2012    left breast cancer 2011 central excision,2012 mastectomy  . Glaucoma   . Arthritis   . Upper GI bleed 01/2013.     Melena. HPylori negative gastritis on EGD.   [name] Complication of anesthesia     shortness of breath after procedure  . SVT (supraventricular tachycardia)   . Protein-calorie malnutrition, severe     Past Surgical History  Procedure Laterality Date  .  Cholecystectomy    . Breast surgery  2011  . Breast surgery  2012    lt breast masty  . Abdominal hysterectomy    . Esophagogastroduodenoscopy N/A 01/25/2013    Procedure: ESOPHAGOGASTRODUODENOSCOPY (EGD);  Surgeon: Jerene Bears, MD;  Location: [place];  Service: Gastroenterology;  Laterality: N/A;  . Esophagogastroduodenoscopy (egd) with propofol  08/04/2014    Procedure: ESOPHAGOGASTRODUODENOSCOPY (EGD) WITH PROPOFOL;  Surgeon: Inda Castle, MD;  Location: [facility];  Service: Endoscopy;;    Kallie Locks, MS, RD, LDN Pager # [phone] After hours/ weekend pager # [phone]

## 2014-08-18 NOTE — Progress Notes (Signed)
Physical Therapy Session Note  Patient Details  Name: [name] MRN: [id number] Date of Birth: Aug 10, 1921  Today's Date: 08/18/2014 PT Individual Time: 4196-2229 PT Individual Time Calculation (min): 30 min   Short Term Goals: Week 2:     Skilled Therapeutic Interventions/Progress Updates:   Pt received sitting in recliner in room, agreeable to therapy session.  Transferred to w/c via RW at S level with cues for foot placement and safety.  Assisted to therapy gym for time management.  Skilled session focused on dynamic standing balance and reaching outside BOS with and without UE support progressing to reaching outside BOS while on airdex foam pad for increased ankle/hip strategy challenge.  Pt requires min A to min/guard for increased facilitation of trunk rotation and increased weight shift to the R during task.  Pt very fearful of falling during session, therefore education on importance of challenging balance while on rehab.  Ended session with seated LAQs on RLE with 2 lb ankle weight in pt subjective pain free ROM x 15 reps.  Pt tolerated well.  Assisted back to hallway outside of room and pt ambulated at close S level back to recliner.  Quick release belt donned and all needs in reach.    Therapy Documentation Precautions:  Precautions Precautions: Fall Precaution Comments: Frequent faller, by self-report. Limited by R knee pain. Restrictions Weight Bearing Restrictions: No Other Position/Activity Restrictions: Caution with RLE due to R knee pain.   Vital Signs: Therapy Vitals Pulse Rate: 67 BP: (!) 160/63 mmHg Pain: pt with no c/o pain during session.    See FIM for current functional status  Therapy/Group: Individual Therapy  Denice Bors 08/18/2014, 9:07 PM

## 2014-08-18 NOTE — Plan of Care (Signed)
Problem: RH BOWEL ELIMINATION Goal: RH STG MANAGE BOWEL WITH ASSISTANCE STG Manage Bowel with min Assistance.  Outcome: Not Progressing Last BM 3/13 Goal: RH STG MANAGE BOWEL W/MEDICATION W/ASSISTANCE STG Manage Bowel with Medication with min Assistance.  Outcome: Not Progressing Last [facility] 08/14/14

## 2014-08-18 NOTE — Progress Notes (Signed)
Physical Therapy Session Note  Patient Details  Name: [name] MRN: [id number] Date of Birth: 12/05/1921  Today's Date: 08/18/2014 PT Individual Time: 0910-1010;   -  PT Individual Time Calculation (min): 60 min, 35 min   Short Term Goals: Week 1:  PT Short Term Goal 1 (Week 1): STG's = LTG's due to ELOS Week 2:     Skilled Therapeutic Interventions/Progress Updates:    Tx 1: Pt received seated in recliner, agreeable to therapy. No c/o pain in RLE due to pre-medicated with meds and Voltaren rub. Granddaughter [name] present for session for training. Treatment focused on family training with granddaughter, [name].   Therapeutic activity for basic, furniture transfers with S.   Neuromuscular reeducation via forced use and multimodal cues with dynamic standing balance tasks: in household environment, folding and hanging laundry in simulated apartment with reaching across midline; standing with BUE support at sink in room, using mirror feedback for upright posture, blocked practice of [place] heel and toe raises.  Gait training for ambulation x 75' with Min guard across tile, thresholds, doorways, carpet, in controlled and household environments; 2 x 5 stairs with Min guard and 1 rail for strengthening and home entry. VCs for safety and technique.  Family trained in guarding during ambulation, transfers, stairs, HEP and return demonstrated all cues and techniques for safety.  Pt returned to room for rest with granddaughter present, all needs within reach.  Tx 2: No c/o increased pain or stiffness after morning session. RLE pitting edema noted to be +2. Treatment focused on therapeutic exercises in recliner and in standing at sink for HEP. Granddaughter present for session and trained in cuing for HEP. Therapeutic exercise for B heel slides 2 x 5, heel raises 2 x 5, toe raises 2 x 5, with VCs to use mirror for postural feedback. Therapeutic activity for transfers sit > stand with S, room  ambulation with RW with Min guard, pt and family education on TED hose and ice for pain and swelling control.  [name] also trained in donning TED hose. Pt returned to recliner for lunch, PT applied ice pack to RLE. All needs within reach.  Cont per PT POC.  Therapy Documentation Precautions:  Precautions Precautions: Fall Precaution Comments: Frequent faller, by self-report. Limited by R knee pain. Restrictions Weight Bearing Restrictions: No Other Position/Activity Restrictions: Caution with RLE due to R knee pain.  Pain: Pain Assessment Pain Assessment: No/denies pain Pain Score: 0-No pain  See FIM for current functional status  Therapy/Group: Individual Therapy Blenda Mounts, SPT   Blenda Mounts 08/18/2014, 4:33 PM

## 2014-08-18 NOTE — Progress Notes (Addendum)
Occupational Therapy Session Note  Patient Details  Name: [name] MRN: [id number] Date of Birth: 1922-01-04  Today's Date: 08/18/2014 OT Individual Time: Make-up Session:  1330-1400 OT Individual Time Calculation (min): 30 min    Short Term Goals: Week 1:  OT Short Term Goal 1 (Week 1): STG= LTG  Skilled Therapeutic Interventions/Progress Updates:    Make-up Session: Pt seen for 1:1 OT session with focus on functional mobility, safety with RW, bed mobility, and energy conservation techniques. Pt received sitting in recliner chair with family present. Discussed supervision level goals with emphasis on 24/7 supervision and energy conservation technqiues. Discussed DME and tub transfer, however family and pt reporting pt will not be showering at home. Ambulated to ADL apartment at supervision level with min cues for safety with RW. Engaged in functional mobility in ADL apartment, including bed transfer, furniture transfer, and ambulating around obstacles. Pt completed at supervision level with mod cues for safety with RW during turn changes. Granddaughter reporting pt has high bed at home therefore ambulated to therapy gym. Practiced bed mobility on elevated mat table at supervision level. Ambulated back to room and pt left sitting in recliner chair with all needs in reach.   Therapy Documentation Precautions:  Precautions Precautions: Fall Precaution Comments: Frequent faller, by self-report. Limited by R knee pain. Restrictions Weight Bearing Restrictions: No Other Position/Activity Restrictions: Caution with RLE due to R knee pain. General:   Vital Signs:  Pain: No report of pain  See FIM for current functional status  Therapy/Group: Individual Therapy  Duayne Cal 08/18/2014, 2:12 PM

## 2014-08-18 NOTE — Progress Notes (Signed)
Subjective/Complaints: Right knee pain better with diclofenac, discussed xray results with pt    Review of Systems - otherwise negative Objective: Vital Signs: Blood pressure 164/56, pulse 67, temperature 98.9 F (37.2 C), temperature source Oral, resp. rate 18, weight 71.215 kg (157 lb), SpO2 100 %. Dg Knee 1-2 Views Right  08/17/2014   CLINICAL DATA:  Fall 3 weeks ago. Persistent right knee pain. Initial encounter.  EXAM: RIGHT KNEE - 1-2 VIEW  COMPARISON:  None.  FINDINGS: There is no evidence of fracture, dislocation, or joint effusion. Moderate tricompartmental osteoarthritis is seen. Chondrocalcinosis also noted. No other focal bone lesion identified.  IMPRESSION: No acute findings.  Moderate tricompartmental osteoarthritis.   Electronically Signed   By: Earle Gell M.D.   On: 08/17/2014 14:27   Results for orders placed or performed during the hospital encounter of 08/10/14 (from the past 72 hour(s))  Hemoglobin and hematocrit, blood     Status: Abnormal   Collection Time: 08/16/14  6:00 AM  Result Value Ref Range   Hemoglobin 7.9 (L) 12.0 - 15.0 g/dL   HCT 24.7 (L) 36.0 - 46.0 %  Hemoglobin and hematocrit, blood     Status: Abnormal   Collection Time: 08/17/14  8:55 AM  Result Value Ref Range   Hemoglobin 8.6 (L) 12.0 - 15.0 g/dL   HCT 27.4 (L) 36.0 - 46.0 %     HEENT: normal Cardio: RRR and no murmur, occ PAC Resp: CTA B/L and unlabored GI: BS positive and NT, ND Extremity:  Pulses positive and No Edema Skin:   Intact Neuro: Alert/Oriented, Normal Sensory and Abnormal Motor 4/5 bilateral delt, bi, tri, grip, HF, KE, ADF Musc/Skel:  Normal, good AROM bilateral shoulders Gen NAD   Assessment/Plan: 1. Functional deficits secondary to severe deconditioning after GI bleed/ABLA which require 3+ hours per day of interdisciplinary therapy in a comprehensive inpatient rehab setting. Physiatrist is providing close team supervision and 24 hour management of active medical  problems listed below. Physiatrist and rehab team continue to assess barriers to discharge/monitor patient progress toward functional and medical goals.  FIM: FIM - Bathing Bathing Steps Patient Completed: Chest, Right Arm, Left Arm, Abdomen, Front perineal area, Buttocks, Right upper leg, Left upper leg, Left lower leg (including foot), Right lower leg (including foot) Bathing: 5: Supervision: Safety issues/verbal cues  FIM - Upper Body Dressing/Undressing Upper body dressing/undressing steps patient completed: Thread/unthread right sleeve of pullover shirt/dresss, Put head through opening of pull over shirt/dress, Pull shirt over trunk, Thread/unthread left sleeve of pullover shirt/dress Upper body dressing/undressing: 6: More than reasonable amount of time FIM - Lower Body Dressing/Undressing Lower body dressing/undressing steps patient completed: Thread/unthread right underwear leg, Thread/unthread left underwear leg, Pull underwear up/down, Thread/unthread right pants leg, Thread/unthread left pants leg, Pull pants up/down, Don/Doff right sock, Don/Doff left sock, Don/Doff right shoe, Don/Doff left shoe Lower body dressing/undressing: 6: More than reasonable amount of time  FIM - Toileting Toileting steps completed by patient: Adjust clothing prior to toileting, Performs perineal hygiene, Adjust clothing after toileting Toileting Assistive Devices: Grab bar or rail for support Toileting: 6: More than reasonable amount of time  FIM - Radio producer Devices: Elevated toilet seat, Grab bars, Insurance account manager Transfers: 5-From toilet/BSC: Supervision (verbal cues/safety issues), 5-To toilet/BSC: Supervision (verbal cues/safety issues)  FIM - Control and instrumentation engineer Devices: Arm rests, Copy: 5: Bed > Chair or W/C: Supervision (verbal cues/safety issues), 5: Chair or W/C > Bed: Supervision (verbal cues/safety  issues)  FIM - Locomotion: Wheelchair Locomotion: Wheelchair: 1: Total Assistance/staff pushes wheelchair (Pt<25%) (due to fatigue) FIM - Locomotion: Ambulation Locomotion: Ambulation Assistive Devices: Administrator Ambulation/Gait Assistance: 4: Min guard Locomotion: Ambulation: 1: Travels less than 50 ft with minimal assistance (Pt.>75%)  Comprehension Comprehension Mode: Auditory Comprehension: 5-Understands basic 90% of the time/requires cueing < 10% of the time  Expression Expression Mode: Verbal Expression: 5-Expresses basic 90% of the time/requires cueing < 10% of the time.  Social Interaction Social Interaction: 5-Interacts appropriately 90% of the time - Needs monitoring or encouragement for participation or interaction.  Problem Solving Problem Solving: 3-Solves basic 50 - 74% of the time/requires cueing 25 - 49% of the time  Memory Memory: 6-More than reasonable amt of time  Medical Problem List and Plan: 1. Functional deficits secondary to severe deconditioning related to upper GI bleed/Anemia/Course complicated by Delerium. 2.  DVT Prophylaxis/Anticoagulation: SCD,s 3. Pain Management: tylenol as needed 4. Hypertension/PSVT.Lopressor 37.5 mg bid. HR and BP controlled at present watch for bradycardia-- adjust as indicated with increased activity 5. Neuropsych: This patient is capable of making basic decisions on her own behalf. Cognition improving 6. Skin/Wound Care: Routine skin checks. Skin intact presently. 7. Fluids/Electrolytes/Nutrition: Strict I and O in follow-up chemistries in AM. 8. GIB/Anemia: goal to keep hgb greater than 7. Received 1u prbc 3/8.   -no colonoscopy or interventions given age -Repeat Hgb stable, will monitor for decline    LOS (Days) 8 A FACE TO FACE EVALUATION WAS PERFORMED  KIRSTEINS,ANDREW E 08/18/2014, 7:15 AM

## 2014-08-18 NOTE — Progress Notes (Signed)
Occupational Therapy Session Note  Patient Details  Name: [name] MRN: [id number] Date of Birth: Feb 09, 1922  Today's Date: 08/18/2014 OT Individual Time: 0802-0906 OT Individual Time Calculation (min): 64 min    Short Term Goals: No short term goals set  Skilled Therapeutic Interventions/Progress Updates:      Pt seen for BADL retraining of toileting, bathing, and dressing with a focus on safe functional mobility in the room with RW. Pt sitting EOB  completing breakfast. Pt ambulated around the room with RW with close S to gather clothing. Pt able to reach to floor level to pick up items using one hand for support on sturdy object. She ambulated into restroom and then completed toilet transfer and toileting with mod I. Dressing mod I including donning TED hose. No c/o dizziness, but she does need frequent rest breaks. Continues to need cues to fully place feet underneath her to ensure a safe sit to stand. Pt resting in w/c for her next PT session.  Therapy Documentation Precautions:  Precautions Precautions: Fall Precaution Comments: Frequent faller, by self-report. Limited by R knee pain. Restrictions Weight Bearing Restrictions: No Other Position/Activity Restrictions: Caution with RLE due to R knee pain.  Pain: Pain Assessment Pain Assessment: No/denies pain  ADL:  See FIM for current functional status  Therapy/Group: Individual Therapy  [place] 08/18/2014, 11:21 AM

## 2014-08-19 ENCOUNTER — Inpatient Hospital Stay (HOSPITAL_COMMUNITY): Payer: Medicare Other | Admitting: *Deleted

## 2014-08-19 ENCOUNTER — Inpatient Hospital Stay (HOSPITAL_COMMUNITY): Payer: Medicare Other | Admitting: Occupational Therapy

## 2014-08-19 DIAGNOSIS — R350 Frequency of micturition: Secondary | ICD-10-CM

## 2014-08-19 LAB — URINALYSIS, ROUTINE W REFLEX MICROSCOPIC
Bilirubin Urine: NEGATIVE
Glucose, UA: NEGATIVE mg/dL
Hgb urine dipstick: NEGATIVE
Ketones, ur: NEGATIVE mg/dL
Leukocytes, UA: NEGATIVE
NITRITE: NEGATIVE
Protein, ur: NEGATIVE mg/dL
SPECIFIC GRAVITY, URINE: 1.01 (ref 1.005–1.030)
UROBILINOGEN UA: 1 mg/dL (ref 0.0–1.0)
pH: 7 (ref 5.0–8.0)

## 2014-08-19 LAB — HEMOGLOBIN AND HEMATOCRIT, BLOOD
HEMATOCRIT: 28.8 % — AB (ref 36.0–46.0)
Hemoglobin: 9.4 g/dL — ABNORMAL LOW (ref 12.0–15.0)

## 2014-08-19 MED ORDER — PANTOPRAZOLE SODIUM 40 MG PO TBEC: 40.0000 mg | DELAYED_RELEASE_TABLET | Freq: Two times a day (BID) | ORAL | Status: DC

## 2014-08-19 MED ORDER — DICLOFENAC SODIUM 1 % TD GEL: 2.0000 g | Freq: Four times a day (QID) | TRANSDERMAL | Status: DC

## 2014-08-19 MED ORDER — TRAVOPROST 0.004 % OP SOLN: 1.0000 [drp] | Freq: Every day | OPHTHALMIC | Status: DC

## 2014-08-19 MED ORDER — METOPROLOL TARTRATE 25 MG PO TABS: 37.5000 mg | ORAL_TABLET | Freq: Two times a day (BID) | ORAL | Status: DC

## 2014-08-19 NOTE — Discharge Summary (Signed)
NAME:  [name], [name]              ACCOUNT NO.:  [id number]  MEDICAL RECORD NO.:  [id number]  LOCATION:  4M04C                        FACILITY:  [facility]  PHYSICIAN:  Charlett Blake, M.D.DATE OF BIRTH:  11/22/1921  DATE OF ADMISSION:  08/10/2014 DATE OF DISCHARGE:  08/20/2014                              DISCHARGE SUMMARY   DISCHARGE DIAGNOSES: 1. Functional deficits secondary to deconditioning related to upper GI     bleed. 2. Sequential compression devices for DVT prophylaxis. 3. Hypertension with paroxysmal supraventricular tachycardia. 4. Gastrointestinal bleed with anemia.  HISTORY OF PRESENT ILLNESS:  This is a 81 year old right-handed female with history of PSVT maintained on aspirin therapy with upper GI bleed in August 2014.  She was independent with a cane prior to admission, living alone.  Presented on August 03, 2014, with generalized weakness and feeling tired over the past week.  She had a bowel movement which she noted dark blood.  The patient also noted to be on daily Mobic prior to admission.  Heart rate 140s to 150s.  Hemoglobin 8.8, dropped to 6.1 from a baseline of 12.5.  Started on intravenous Protonix. Gastroenterology consulted, underwent EGD per Dr. Deatra Ina that showed no fresh blood on exam, otherwise unremarkable.  Colonoscopy was not felt to be appropriate giving her advanced age.  CT of abdomen and pelvis showed bilateral internal iliac artery aneurysms with considerable mural thrombus.  There were only minimally enlarged when compared to prior exam of 2012.  She was not a candidate for anticoagulation.  The patient was transfused during her hospital course and monitored.  Latest hemoglobin 8.3.  She remained on metoprolol for chronic SVT, on echocardiogram which was unremarkable.  Bouts of delirium that improved. Due to GTC prolonged, it was advised to avoid any Haldol or Seroquel and to use only Ativan if absolutely necessary for her confusion.  She  was tolerating a regular diet.  Physical and occupational therapy ongoing. The patient was admitted for comprehensive rehab program.  PAST MEDICAL HISTORY:  See discharge diagnoses.  SOCIAL HISTORY:  Lives alone.  Used a single-point cane prior to admission.  Functional status upon admission to rehab services was minimal guarding, ambulate 50 feet with a rolling walker, minimal guard sit to stand, min to mod assist for activities of daily living.  PHYSICAL EXAMINATION:  VITAL SIGNS:  Blood pressure 120/45, pulse 56, temperature 98, respirations 18. GENERAL:  This was an alert female, she was oriented to name, age, date of birth.  She was able to provide place and month, followed simple commands. LUNGS:  Clear to auscultation. CARDIAC:  Regular rate and rhythm. ABDOMEN:  Soft, nontender.  Good bowel sounds.  REHABILITATION HOSPITAL COURSE:  Patient was admitted to inpatient rehab services with therapies initiated on a 3-hour daily basis consisting of physical therapy, occupational therapy, and rehabilitation nursing.  The following issues were addressed during the patient's rehabilitation stay.  Pertaining to [name]'s functional deficits secondary to severe deconditioning debilitation related to upper GI bleed remained stable.  She continued to participate fully with her therapies. Sequential compression devices for DVT prophylaxis.  Blood pressures controlled on Lopressor.  No chest pain or shortness of breath.  Her  confusion continued to greatly improved.  Needed some modest cues for safety.  Hemoglobin remained stable after GI bleed.  She would follow up with Gastroenterology Services.  Latest hemoglobin 8.6.  She remained on Protonix twice daily.  The patient received weekly collaborative interdisciplinary team conferences to discuss estimated length of stay, family teaching, and any barriers to discharge.  She transferred wheelchair to rolling walker at supervision level  with cues for foot placement.  Working on dynamic standing balance, upper extremity support required minimal assist to minimal guard for increased facilitation of trunk rotation.  She ambulated at close supervision level.  Activities of daily living, discussed the need for 24-hour supervision for her safety as well as energy conservation techniques.  She ambulated to the activities of daily living apartment at supervision level with minimal cues for safety.  Full family teaching was completed and plan discharge to home.  DISCHARGE MEDICATIONS:  Voltaren Gel 4 times daily to affected area, Xalatan ophthalmic solution 0.005% 1 drop both eyes at bedtime, metoprolol 37.5 mg b.i.d., Protonix 40 mg p.o. b.i.d.  DIET:  Regular.  FOLLOWUP:  The patient would follow up Dr. Alysia Penna at the outpatient rehab center as needed; Dr. Erskine Emery, Gastroenterology Services, call for appointment; Dr. Vertell Novak, Medical Management.  The patient was advised no aspirin therapy at this time due to GI bleed.     Lauraine Rinne, P.A.   ______________________________ Charlett Blake, M.D.    DA/MEDQ  D:  08/19/2014  T:  08/19/2014  Job:  [id number]  cc:   Sandy Salaam. Deatra Ina, MD,FACG Vertell Novak, MD

## 2014-08-19 NOTE — Progress Notes (Signed)
Physical Therapy Discharge Summary  Patient Details  Name: [name] MRN: [id number] Date of Birth: [birth year]  Today's Date: 08/19/2014 PT Individual Time: 0800-0900       Patient has met 8 of 8 long term goals due to improved activity tolerance, improved balance, improved postural control, increased strength, decreased pain, ability to compensate for deficits, improved attention and improved awareness.  Patient to discharge at an ambulatory level Supervision.   Patient's care partners are independent to provide the necessary physical and cognitive assistance at discharge.  Reasons goals not met: n/a  Recommendation:  Patient will benefit from ongoing skilled PT services in home health setting to continue to advance safe functional mobility, address ongoing impairments in dynamic standing balance, RLE pain and weakness, back pain, and to minimize fall risk.  Equipment: RW;  R knee compression brace recommended to family by orthotist 08/17/2014, pt would benefit from follow up from HHPT/OPPT re: R knee OTC bracing options   Reasons for discharge: treatment goals met and discharge from hospital  Patient/family agrees with progress made and goals achieved: Yes  PT Discharge  Pt received EOB with breakfast tray, no c/o increased pain after last session and agreeable to therapy. Treatment focused on review of all functional mobility skills via therapeutic activities, gait training.  Therapeutic activities: Pt propelled w/c x 60' with [place] with S. Pt engaged in re-assessment of Berg Balance, with score increasing from 20 > 23. Basic transfers are S overall, and bed mobility via supine >< sit performed with Mod I. Pt also engaged in dynamic sitting and standing balance functional activities to assess balance and reach, performed with S and no c/o increased dizziness. Pt and family educated on continuum of PT services.  Gait training: Pt ambulated 1 x 160', 1 x 60', with RW and S in  controlled hallway over tile, doorways thresholds, R/L turns. VCs for walker placement during transitional movements/turns. Pt with improved knowledge of RW use in household spaces. Pt ascended/descended 5 stairs with L rail and Min A; VCs for technique and sequencing. Pt also ascended/descending ramp with RW and S, VCs for safety.  Pt discharge assessment complete and pt handed off to OT in good spirits, no c/o increased pain or dizziness. Pt with improvements in R knee pain control, back pain, general strength and cardiorespiratory conditioning, activity tolerance, functional endurance, mobility skills. Granddaughter Louretta Shorten available at conclusion of session to review progress and review of family mobility training.    Precautions/Restrictions Precautions Precautions: Fall Precaution Comments: Frequent faller, by self-report. Limited by R knee pain. Restrictions Weight Bearing Restrictions: No Other Position/Activity Restrictions: Caution with RLE due to R knee pain. Pain Pain Assessment Pain Assessment: No/denies pain Pain Score: 0-No pain Faces Pain Scale: No hurt Vision/Perception  Vision - Assessment Additional Comments: wears bifocals, + glaucoma, + headache, s/p B cataract sx  Cognition Overall Cognitive Status: History of cognitive impairments - at baseline Arousal/Alertness: Awake/alert Orientation Level: Oriented X4 Problem Solving: Impaired Problem Solving Impairment: Functional basic Executive Function: Organizing;Sequencing;Decision Making;Initiating;Self Monitoring;Self Correcting;Reasoning Reasoning: Impaired Sequencing: Impaired Organizing: Impaired Decision Making: Appears intact Initiating: Impaired Self Monitoring: Appears intact Self Correcting: Appears intact Safety/Judgment: Appears intact Comments: Aware of deficits and precautions, demonstrates understanding. Sensation Sensation Light Touch: Appears Intact Stereognosis: Appears Intact Hot/Cold: Appears  Intact Proprioception: Appears Intact Coordination Gross Motor Movements are Fluid and Coordinated: No Coordination and Movement Description: Limited by RLE pain, abnormal postural (premorbid kyphosis with scoliosis) Finger Nose Finger Test: appears intact, some tremor noted  at baseline Motor  Motor Motor: Abnormal postural alignment and control;Motor impersistence (Antalgic) Motor - Skilled Clinical Observations: Improved attention to mobility tasks. Demonstrates recall of safe hand placement. Demonstrates knowledge and understanding of HEP. Antalgic patterns due to premorbid advanced musculoskeletal postural abnormalities. Caution with R knee, arthritic. Pt is stoic and uncomplaining, always pleasant. Encourage her to monitor symptoms and report pain. Self-reported frequent faller; high falls risk.  Mobility Bed Mobility Bed Mobility: Supine to Sit Supine to Sit: 6: Modified Independent Transfers Transfers: Yes Stand Pivot Transfers: 5: Supervision Stand Pivot Transfer Details (indicate cue type and reason): with and without RW Car transfers: Supervision Locomotion  Ambulation Ambulation: Yes Ambulation/Gait Assistance: 5: Supervision Ambulation Distance (Feet): 175 Feet Assistive device: Rolling walker Ambulation/Gait Assistance Details: Verbal cues for safe use of DME/AE;Verbal cues for technique;Verbal cues for precautions/safety;Visual cues for safe use of DME/AE Ambulation/Gait Assistance Details: Improved knowledge and understanding of safe ambulation with AD. Gait Gait: Yes Gait Pattern: Impaired Gait Pattern: Step-through pattern;Decreased step length - right;Decreased stance time - right;Decreased weight shift to right;Right flexed knee in stance;Antalgic;Lateral hip instability;Decreased trunk rotation;Trunk flexed;Abducted- right;Poor foot clearance - right Gait velocity: 10 MWT = 1.17 ft/sec, "risk for recurrent falls" High Level Ambulation High Level Ambulation: Side  stepping;Backwards walking;Direction changes Side Stepping: S, min cues Backwards Walking: S, min cues Direction Changes: S, min cues Stairs / Additional Locomotion Stairs: Yes Stairs Assistance: 4: Min assist Stairs Assistance Details: Manual facilitation for weight shifting;Manual facilitation for weight bearing;Verbal cues for technique;Verbal cues for precautions/safety;Verbal cues for sequencing;Visual cues/gestures for sequencing;Tactile cues for placement Stair Management Technique: One rail Left;Step to pattern Number of Stairs: 5 Height of Stairs: 6 Ramp: 5: Supervision Curb: 5: Psychiatric nurse: Yes Wheelchair Assistance: 5: Careers information officer: Both upper extremities Wheelchair Parts Management: Needs assistance;Supervision/cueing;Other (comment) (intermittent need for A with B brakes due to grip weakness and premorbid OA) Distance: 60  Trunk/Postural Assessment  Cervical Assessment Cervical Assessment: Exceptions to [facility] (premorbid scoliosis, kyphosis; resultant weakness,) Thoracic Assessment Thoracic Assessment: Exceptions to [facility] (premorbid scoliosis, kyphosis; resultant weakness) Lumbar Assessment Lumbar Assessment: Exceptions to [facility] (premorbid scoliosis, kyphosis; resultant weakness; contralateral pelvic drop; + back pain) Postural Control Postural Control: Deficits on evaluation (limited by musculoskeletal abnormalities, weakness, pain, antalgic patterns)  Balance Balance Balance Assessed: Yes Standardized Balance Assessment Standardized Balance Assessment: Berg Balance Test Berg Balance Test Sit to Stand: Able to stand  independently using hands Standing Unsupported: Able to stand 2 minutes with supervision Sitting with Back Unsupported but Feet Supported on Floor or Stool: Able to sit 2 minutes under supervision Stand to Sit: Controls descent by using hands Transfers: Able to transfer safely, definite need of  hands Standing Unsupported with Eyes Closed: Able to stand 3 seconds Standing Ubsupported with Feet Together: Needs help to attain position and unable to hold for 15 seconds From Standing, Reach Forward with Outstretched Arm: Reaches forward but needs supervision From Standing Position, Pick up Object from Floor: Unable to pick up and needs supervision From Standing Position, Turn to Look Behind Over each Shoulder: Turn sideways only but maintains balance Turn 360 Degrees: Able to turn 360 degrees safely but slowly Standing Unsupported, Alternately Place Feet on Step/Stool: Needs assistance to keep from falling or unable to try Standing Unsupported, One Foot in Front: Loses balance while stepping or standing Standing on One Leg: Unable to try or needs assist to prevent fall Total Score: 23 Static Sitting Balance Static Sitting - Balance Support: No upper  extremity supported;Feet supported;Feet unsupported Dynamic Sitting Balance Dynamic Sitting - Balance Support: No upper extremity supported Dynamic Sitting - Level of Assistance: 5: Stand by assistance Dynamic Sitting Balance - Compensations: + B UE protective responses Reach (Patient is able to reach _5__ inches to right, left, forward, back): [facility] Dynamic Sitting - Balance Activities: Lateral lean/weight shifting;Forward lean/weight shifting;Reaching across midline;Trunk control activities;Reaching for objects Static Standing Balance Static Standing - Balance Support: Right upper extremity supported;Left upper extremity supported;Bilateral upper extremity supported;No upper extremity supported;During functional activity Dynamic Standing Balance Dynamic Standing - Balance Support: Right upper extremity supported;Left upper extremity supported;Bilateral upper extremity supported;No upper extremity supported;During functional activity Extremity Assessment      RLE Assessment RLE Assessment: Exceptions to [facility] RLE AROM (degrees) Overall AROM  Right Lower Extremity: Deficits;Due to pain;Due to premorbid status; AROM of RLE limited by pain and premorbid postural abnormalities, including R knee medial collapse/valgus/overall LE pronation pattern RLE PROM (degrees) Overall PROM Right Lower Extremity: Deficits;Due to premorbid status;Due to pain: R knee extension lacks 8 deg, R ankle achieves 0 deg DF RLE Strength RLE Overall Strength: Deficits; Due to pain;Due to premorbid status; assessed grossly in seated, R hip 4/5, R knee extension 3+/5, R ankle DF 4/5 RLE Tone RLE Tone: Within Functional Limits RLE Tone Comments: none noted  LLE Assessment LLE Assessment: Within Functional Limits LLE AROM (degrees) LLE Overall AROM Comments: grossly assessed with functional activities; overall BLE pronation pattern LLE PROM (degrees) LLE Overall PROM Comments: grossly assessed with functional activities; knee achieves 0 deg/full extension LLE Strength LLE Overall Strength Comments: grossly assessed in sitting, 4+/5 overall with exception of 4/5 hip flexion  See FIM for current functional status Blenda Mounts, SPT  Blenda Mounts 08/19/2014, 1:04 PM

## 2014-08-19 NOTE — Progress Notes (Signed)
Social Work Discharge Note Discharge Note  The overall goal for the admission was met for:   Discharge location: [place]  Length of Stay: Yes-10 DAYS  Discharge activity level: Yes-SUPERVISION/MOD/I LEVEL  Home/community participation: Yes  Services provided included: MD, RD, PT, OT, RN, CM, TR, Pharmacy and SW  Financial Services: Medicare  Follow-up services arranged: Home Health: [place], DME: [place] and Patient/Family has no preference for HH/DME agencies  Comments (or additional information):[facility] [place] [address].  AWARE OF TEAM'S RECOMMENDATIONS.  Patient/Family verbalized understanding of follow-up arrangements: Yes  Individual responsible for coordination of the follow-up plan: TARSHA-GRANDAUGHTER & ANN-DAUGHTER  Confirmed correct DME delivered: [name] 08/19/2014    [name]

## 2014-08-19 NOTE — Progress Notes (Signed)
Occupational Therapy Discharge Summary  Patient Details  Name: [name] MRN: [id number] Date of Birth: 1921-06-09  Today's Date: 08/19/2014 OT Individual Time: 0900-1000 and 1400-1500 OT Individual Time Calculation (min): 60 min and 60 min   Patient has met 12 of 12 long term goals due to improved activity tolerance, improved balance, postural control, ability to compensate for deficits and improved awareness.  Patient to discharge at overall Supervision level.  Patient's care partner is independent to provide the necessary supervision and cues for safety assistance at discharge.    Reasons goals not met: all goals met  Recommendation:  Patient will benefit from ongoing skilled OT services in home health setting to continue to advance functional skills in the area of BADL and iADL.  Equipment: BSC  Reasons for discharge: treatment goals met  Patient/family agrees with progress made and goals achieved: Yes   OT Intervention: Session 1: Pt received from therapy gym and transitioned easily from PT session. Pt with 3/10 c/o soreness in L knee. Pt ambulated to room ~ 100 feet with use of RW and supervision. Pt requiring rest break once in room. Bathing and dressing with STS at sink side. Pt required supervision for safety during bathing. Mod I for dressing tasks and to obtain needed items. OT provided verbal cues for pt to sit for some tasks in order to conserve energy. Pt donned and doffed TED hose independently as well. Pt seated in recliner chair with call bell within reach as exiting the room.   Session 2: Upon entering the room, pt seated in recliner chair with family member present. Pt with no c/o pain this session. Family and pt with questions regarding home health OT. OT educated pt and caregiver on expectations as well as encouraged them to be an advocate in order to make goals client centered. Pt ambulated to bathroom with  RW and family providing supervision. Toilet transfer  and toileting with Mod I this session. Family concerned about fall risk at home and pt nervous about returning home. OT educated and provided pt with handouts regarding energy conservation techniques of general principles, for ADLs, and IADL tasks. OT discussed community situations such as doctors visits in which pt would have to enlist energy conservation techniques. Basic recommendations to decrease fall risk such as staying within RW, removal of throw rugs, removal of clutter made as well. Pt and caregiver verbalizing understanding. RN entering the room as therapist exiting .  OT Discharge Precautions/Restrictions  Precautions Precautions: Fall Precaution Comments: Frequent faller, by self-report. Limited by R knee pain. Restrictions Weight Bearing Restrictions: No Other Position/Activity Restrictions: Caution with RLE due to R knee pain. Pain Pain Assessment Pain Assessment: No/denies pain Pain Score: 0-No pain Faces Pain Scale: No hurt Vision/Perception  Vision- History Baseline Vision/History: Wears glasses Wears Glasses: At all times Patient Visual Report: No change from baseline Vision- Assessment Vision Assessment?: No apparent visual deficits  Cognition Overall Cognitive Status: History of cognitive impairments - at baseline Arousal/Alertness: Awake/alert Orientation Level: Oriented X4 Problem Solving: Impaired Problem Solving Impairment: Functional basic Executive Function: Organizing;Sequencing;Decision Making;Initiating;Self Monitoring;Self Correcting;Reasoning Reasoning: Impaired Sequencing: Impaired Organizing: Impaired Decision Making: Appears intact Initiating: Impaired Self Monitoring: Appears intact Self Correcting: Appears intact Safety/Judgment: Appears intact Comments: Aware of deficits and precautions, demonstrates understanding. Sensation Sensation Light Touch: Appears Intact Stereognosis: Appears Intact Hot/Cold: Appears Intact Proprioception:  Appears Intact Coordination Gross Motor Movements are Fluid and Coordinated: No Fine Motor Movements are Fluid and Coordinated: Yes Coordination and Movement Description: Limited  by RLE pain, abnormal postural (premorbid kyphosis with scoliosis) Finger Nose Finger Test: appears intact, some tremor noted at baseline Motor  Motor Motor: Abnormal postural alignment and control;Motor impersistence Mobility  Bed Mobility Bed Mobility: Supine to Sit Supine to Sit: 5: Supervision  Trunk/Postural Assessment  Cervical Assessment Cervical Assessment: Exceptions to [facility] Thoracic Assessment Thoracic Assessment: Exceptions to [facility] (kyphotic) Lumbar Assessment Lumbar Assessment: Exceptions to [facility] Postural Control Postural Control: Deficits on evaluation  Balance Balance Balance Assessed: Yes Static Sitting Balance Static Sitting - Balance Support: No upper extremity supported;Feet supported;Feet unsupported Dynamic Sitting Balance Dynamic Sitting - Balance Support: No upper extremity supported Dynamic Sitting - Level of Assistance: 5: Stand by assistance Dynamic Sitting - Balance Activities: Lateral lean/weight shifting;Forward lean/weight shifting;Reaching across midline;Trunk control activities;Reaching for objects Static Standing Balance Static Standing - Balance Support: Right upper extremity supported;Left upper extremity supported;Bilateral upper extremity supported;No upper extremity supported;During functional activity Dynamic Standing Balance Dynamic Standing - Balance Support: Right upper extremity supported;Left upper extremity supported;Bilateral upper extremity supported;No upper extremity supported;During functional activity Extremity/Trunk Assessment RUE Assessment RUE Assessment: Within Functional Limits LUE Assessment LUE Assessment: Within Functional Limits  See FIM for current functional status  Phineas Semen 08/19/2014, 12:00 PM

## 2014-08-19 NOTE — Discharge Summary (Signed)
Discharge summary job [id number]

## 2014-08-19 NOTE — Discharge Instructions (Signed)
Inpatient Rehab Discharge Instructions  [name] Discharge date and time: No discharge date for patient encounter.   Activities/Precautions/ Functional Status: Activity: activity as tolerated Diet: regular diet Wound Care: none needed Functional status:  ___ No restrictions     ___ Walk up steps independently ___ 24/7 supervision/assistance   ___ Walk up steps with assistance ___ Intermittent supervision/assistance  ___ Bathe/dress independently ___ Walk with walker     ___ Bathe/dress with assistance ___ Walk Independently    ___ Shower independently _x__ Walk with assistance    ___ Shower with assistance ___ No alcohol     ___ Return to work/school ________  Special Instructions: No aspirin until follow-up with gastroenterology services Dr. Deatra Ina.  COMMUNITY REFERRALS UPON DISCHARGE:    Home Health:   [facility]     YKZLD:[phone]  Medical Equipment/Items [facility]   [phone]    My questions have been answered and I understand these instructions. I will adhere to these goals and the provided educational materials after my discharge from the hospital.  Patient/Caregiver Signature _______________________________ Date __________  Clinician Signature _______________________________________ Date __________  Please bring this form and your medication list with you to all your follow-up doctor's appointments.

## 2014-08-19 NOTE — Progress Notes (Signed)
Subjective/Complaints: Right knee pain better with diclofenac, had freq urination last noc but denied burning pain    Review of Systems - otherwise negative Objective: Vital Signs: Blood pressure 160/63, pulse 67, temperature 98.7 F (37.1 C), temperature source Oral, resp. rate 17, weight 71.215 kg (157 lb), SpO2 100 %. Dg Knee 1-2 Views Right  08/17/2014   CLINICAL DATA:  Fall 3 weeks ago. Persistent right knee pain. Initial encounter.  EXAM: RIGHT KNEE - 1-2 VIEW  COMPARISON:  None.  FINDINGS: There is no evidence of fracture, dislocation, or joint effusion. Moderate tricompartmental osteoarthritis is seen. Chondrocalcinosis also noted. No other focal bone lesion identified.  IMPRESSION: No acute findings.  Moderate tricompartmental osteoarthritis.   Electronically Signed   By: Earle Gell M.D.   On: 08/17/2014 14:27   Results for orders placed or performed during the hospital encounter of 08/10/14 (from the past 72 hour(s))  Hemoglobin and hematocrit, blood     Status: Abnormal   Collection Time: 08/17/14  8:55 AM  Result Value Ref Range   Hemoglobin 8.6 (L) 12.0 - 15.0 g/dL   HCT 27.4 (L) 36.0 - 46.0 %     HEENT: normal Cardio: RRR and no murmur, occ PAC Resp: CTA B/L and unlabored GI: BS positive and NT, ND Extremity:  Pulses positive and No Edema Skin:   Intact Neuro: Alert/Oriented, Normal Sensory and Abnormal Motor 4/5 bilateral delt, bi, tri, grip, HF, KE, ADF Musc/Skel:  Normal, good AROM bilateral shoulders Gen NAD   Assessment/Plan: 1. Functional deficits secondary to severe deconditioning after GI bleed/ABLA which require 3+ hours per day of interdisciplinary therapy in a comprehensive inpatient rehab setting. Physiatrist is providing close team supervision and 24 hour management of active medical problems listed below. Physiatrist and rehab team continue to assess barriers to discharge/monitor patient progress toward functional and medical goals. Plan for D/C in  am FIM: FIM - Bathing Bathing Steps Patient Completed: Chest, Right Arm, Left Arm, Abdomen, Front perineal area, Buttocks, Right upper leg, Left upper leg, Left lower leg (including foot), Right lower leg (including foot) Bathing: 5: Supervision: Safety issues/verbal cues  FIM - Upper Body Dressing/Undressing Upper body dressing/undressing steps patient completed: Thread/unthread right sleeve of pullover shirt/dresss, Put head through opening of pull over shirt/dress, Pull shirt over trunk, Thread/unthread left sleeve of pullover shirt/dress Upper body dressing/undressing: 6: More than reasonable amount of time FIM - Lower Body Dressing/Undressing Lower body dressing/undressing steps patient completed: Thread/unthread right underwear leg, Thread/unthread left underwear leg, Pull underwear up/down, Thread/unthread right pants leg, Thread/unthread left pants leg, Pull pants up/down, Don/Doff right sock, Don/Doff left sock, Don/Doff right shoe, Don/Doff left shoe, Fasten/unfasten left shoe, Fasten/unfasten right shoe Lower body dressing/undressing: 6: More than reasonable amount of time  FIM - Toileting Toileting steps completed by patient: Adjust clothing prior to toileting Toileting Assistive Devices: Grab bar or rail for support Toileting: 2: Max-Patient completed 1 of 3 steps  FIM - Radio producer Devices: Elevated toilet seat, Grab bars, Insurance account manager Transfers: 6-To toilet/ BSC, 6-From toilet/BSC  FIM - Control and instrumentation engineer Devices: Arm rests Bed/Chair Transfer: 5: Bed > Chair or W/C: Supervision (verbal cues/safety issues), 5: Chair or W/C > Bed: Supervision (verbal cues/safety issues)  FIM - Locomotion: Wheelchair Locomotion: Wheelchair: 1: Total Assistance/staff pushes wheelchair (Pt<25%) (due to fatigue) FIM - Locomotion: Ambulation Locomotion: Ambulation Assistive Devices: Administrator Ambulation/Gait Assistance: 4: Min  guard Locomotion: Ambulation: 2: Travels 50 - 149 ft with  minimal assistance (Pt.>75%)  Comprehension Comprehension Mode: Auditory Comprehension: 5-Understands basic 90% of the time/requires cueing < 10% of the time  Expression Expression Mode: Verbal Expression: 5-Expresses basic needs/ideas: With no assist  Social Interaction Social Interaction: 5-Interacts appropriately 90% of the time - Needs monitoring or encouragement for participation or interaction.  Problem Solving Problem Solving: 3-Solves basic 50 - 74% of the time/requires cueing 25 - 49% of the time  Memory Memory: 7-Complete Independence: No helper  Medical Problem List and Plan: 1. Functional deficits secondary to severe deconditioning related to upper GI bleed/Anemia/Course complicated by Delerium. 2.  DVT Prophylaxis/Anticoagulation: SCD,s 3. Pain Management: tylenol as needed 4. Hypertension/PSVT.Lopressor 37.5 mg bid. HR and BP controlled at present watch for bradycardia-- adjust as indicated with increased activity 5. Neuropsych: This patient is capable of making basic decisions on her own behalf. Cognition improving 6. Skin/Wound Care: Routine skin checks. Skin intact presently. 7. Fluids/Electrolytes/Nutrition: Strict I and O in follow-up chemistries in AM. 8. GIB/Anemia: goal to keep hgb greater than 7. Received 1u prbc 3/8.   -no colonoscopy or interventions given age -Repeat Hgb stable, will monitor for decline 9.  Urinary freq, check UA   LOS (Days) 9 A FACE TO FACE EVALUATION WAS PERFORMED  KIRSTEINS,ANDREW E 08/19/2014, 7:08 AM

## 2014-08-19 NOTE — Plan of Care (Signed)
Problem: RH Tub/Shower Transfers Goal: LTG Patient will perform tub/shower transfers w/assist (OT) LTG: Patient will perform tub/shower transfers with assist, with/without cues using equipment (OT)  Outcome: Not Applicable Date Met:  84/78/41 Family reports pt will not be getting into shower at home. Sink baths only.

## 2014-08-20 NOTE — Progress Notes (Signed)
Subjective/Complaints: No complaints. Excited to go home.   Review of Systems - otherwise negative Objective: Vital Signs: Blood pressure 155/72, pulse 66, temperature 97.7 F (36.5 C), temperature source Oral, resp. rate 18, weight 71.215 kg (157 lb), SpO2 100 %. No results found. Results for orders placed or performed during the hospital encounter of 08/10/14 (from the past 72 hour(s))  Hemoglobin and hematocrit, blood     Status: Abnormal   Collection Time: 08/17/14  8:55 AM  Result Value Ref Range   Hemoglobin 8.6 (L) 12.0 - 15.0 g/dL   HCT 27.4 (L) 36.0 - 46.0 %  Hemoglobin and hematocrit, blood     Status: Abnormal   Collection Time: 08/19/14  8:00 AM  Result Value Ref Range   Hemoglobin 9.4 (L) 12.0 - 15.0 g/dL   HCT 28.8 (L) 36.0 - 46.0 %  Urinalysis, Routine w reflex microscopic     Status: None   Collection Time: 08/19/14  2:01 PM  Result Value Ref Range   Color, Urine YELLOW YELLOW   APPearance CLEAR CLEAR   Specific Gravity, Urine 1.010 1.005 - 1.030   pH 7.0 5.0 - 8.0   Glucose, UA NEGATIVE NEGATIVE mg/dL   Hgb urine dipstick NEGATIVE NEGATIVE   Bilirubin Urine NEGATIVE NEGATIVE   Ketones, ur NEGATIVE NEGATIVE mg/dL   Protein, ur NEGATIVE NEGATIVE mg/dL   Urobilinogen, UA 1.0 0.0 - 1.0 mg/dL   Nitrite NEGATIVE NEGATIVE   Leukocytes, UA NEGATIVE NEGATIVE    Comment: MICROSCOPIC NOT DONE ON URINES WITH NEGATIVE PROTEIN, BLOOD, LEUKOCYTES, NITRITE, OR GLUCOSE <1000 mg/dL.     HEENT: normal Cardio: RRR and no murmur, occ PAC Resp: CTA B/L and unlabored GI: BS positive and NT, ND Extremity:  Pulses positive and No Edema Skin:   Intact Neuro: Alert/Oriented, Normal Sensory and Abnormal Motor 4/5 bilateral delt, bi, tri, grip, HF, KE, ADF Musc/Skel:  Normal, good AROM bilateral shoulders Gen NAD   Assessment/Plan: 1. Functional deficits secondary to severe deconditioning after GI bleed/ABLA which require 3+ hours per day of interdisciplinary therapy in a  comprehensive inpatient rehab setting. Physiatrist is providing close team supervision and 24 hour management of active medical problems listed below. Physiatrist and rehab team continue to assess barriers to discharge/monitor patient progress toward functional and medical goals. Plan for D/C in am FIM: FIM - Bathing Bathing Steps Patient Completed: Chest, Right Arm, Left Arm, Abdomen, Front perineal area, Buttocks, Right upper leg, Left upper leg, Left lower leg (including foot), Right lower leg (including foot) Bathing: 5: Supervision: Safety issues/verbal cues  FIM - Upper Body Dressing/Undressing Upper body dressing/undressing steps patient completed: Thread/unthread right sleeve of pullover shirt/dresss, Put head through opening of pull over shirt/dress, Pull shirt over trunk, Thread/unthread left sleeve of pullover shirt/dress Upper body dressing/undressing: 6: More than reasonable amount of time FIM - Lower Body Dressing/Undressing Lower body dressing/undressing steps patient completed: Thread/unthread right underwear leg, Thread/unthread left underwear leg, Pull underwear up/down, Thread/unthread right pants leg, Thread/unthread left pants leg, Pull pants up/down, Don/Doff right sock, Don/Doff left sock, Don/Doff right shoe, Don/Doff left shoe, Fasten/unfasten left shoe, Fasten/unfasten right shoe Lower body dressing/undressing: 6: More than reasonable amount of time  FIM - Toileting Toileting steps completed by patient: Adjust clothing prior to toileting, Performs perineal hygiene, Adjust clothing after toileting Toileting Assistive Devices: Grab bar or rail for support Toileting: 6: More than reasonable amount of time  FIM - Radio producer Devices: Elevated toilet seat, Insurance account manager Transfers: 6-More  than reasonable amt of time  FIM - Control and instrumentation engineer Devices: Walker, Arm rests Bed/Chair Transfer: 6: Supine > Sit: No  assist, 6: Sit > Supine: No assist, 5: Chair or W/C > Bed: Supervision (verbal cues/safety issues), 5: Bed > Chair or W/C: Supervision (verbal cues/safety issues)  FIM - Locomotion: Wheelchair Distance: 60 Locomotion: Wheelchair: 2: Travels 50 - 149 ft with supervision, cueing or coaxing FIM - Locomotion: Ambulation Locomotion: Ambulation Assistive Devices: Administrator Ambulation/Gait Assistance: 5: Supervision Locomotion: Ambulation: 5: Travels 150 ft or more with supervision/safety issues  Comprehension Comprehension Mode: Auditory Comprehension: 6-Follows complex conversation/direction: With extra time/assistive device  Expression Expression Mode: Verbal Expression: 6-Expresses complex ideas: With extra time/assistive device  Social Interaction Social Interaction: 6-Interacts appropriately with others with medication or extra time (anti-anxiety, antidepressant).  Problem Solving Problem Solving: 4-Solves basic 75 - 89% of the time/requires cueing 10 - 24% of the time  Memory Memory: 6-More than reasonable amt of time  Medical Problem List and Plan: 1. Functional deficits secondary to severe deconditioning related to upper GI bleed/Anemia/Course complicated by Delerium. 2.  DVT Prophylaxis/Anticoagulation: SCD,s 3. Pain Management: tylenol as needed 4. Hypertension/PSVT.Lopressor 37.5 mg bid. HR and BP controlled at present watch for bradycardia-- adjust as indicated with increased activity 5. Neuropsych: This patient is capable of making basic decisions on her own behalf. Cognition improving 6. Skin/Wound Care: Routine skin checks. Skin intact presently. 7. Fluids/Electrolytes/Nutrition: Strict I and O in follow-up chemistries in AM. 8. GIB/Anemia: goal to keep hgb greater than 7. Received 1u prbc 3/8.   -no colonoscopy or interventions given age -Repeat Hgb stable, will monitor for decline 9.  Urinary freq, UA neg   LOS (Days) 10 A FACE TO FACE EVALUATION WAS  PERFORMED  Demyan Fugate T 08/20/2014, 8:24 AM

## 2014-08-22 ENCOUNTER — Telehealth: Payer: Self-pay | Admitting: *Deleted

## 2014-08-22 NOTE — Telephone Encounter (Signed)
Transition Care Management Follow-up Telephone Call D/C 08/20/14  How have you been since you were released from the hospital? Daughter [name]) states mom is doing ok   Do you understand why you were in the hospital? YES   Do you understand the discharge instrcutions? YES  Items Reviewed:  Medications reviewed: YES  Allergies reviewed: YES  Dietary changes reviewed: NO  Referrals reviewed: No referrals needed   Functional Questionnaire:   Activities of Daily Living (ADLs):   She states they are independent in the following: ambulation, bathing and hygiene, feeding and toileting States she require assistance with the following: dressing at times   Any transportation issues/concerns?: NO   Any patient concerns? NO   Confirmed importance and date/time of follow-up visits scheduled: YES, appt made 08/30/14 w/Dr. Doug Sou   Confirmed with patient if condition begins to worsen call PCP or go to the ER.

## 2014-08-23 DIAGNOSIS — Z9012 Acquired absence of left breast and nipple: Secondary | ICD-10-CM | POA: Diagnosis not present

## 2014-08-23 DIAGNOSIS — I471 Supraventricular tachycardia: Secondary | ICD-10-CM | POA: Diagnosis not present

## 2014-08-23 DIAGNOSIS — D649 Anemia, unspecified: Secondary | ICD-10-CM | POA: Diagnosis not present

## 2014-08-23 DIAGNOSIS — E46 Unspecified protein-calorie malnutrition: Secondary | ICD-10-CM | POA: Diagnosis not present

## 2014-08-23 DIAGNOSIS — Z853 Personal history of malignant neoplasm of breast: Secondary | ICD-10-CM | POA: Diagnosis not present

## 2014-08-23 DIAGNOSIS — K922 Gastrointestinal hemorrhage, unspecified: Secondary | ICD-10-CM | POA: Diagnosis not present

## 2014-08-23 DIAGNOSIS — M1990 Unspecified osteoarthritis, unspecified site: Secondary | ICD-10-CM | POA: Diagnosis not present

## 2014-08-23 DIAGNOSIS — M6281 Muscle weakness (generalized): Secondary | ICD-10-CM | POA: Diagnosis not present

## 2014-08-23 DIAGNOSIS — I10 Essential (primary) hypertension: Secondary | ICD-10-CM | POA: Diagnosis not present

## 2014-08-25 DIAGNOSIS — K922 Gastrointestinal hemorrhage, unspecified: Secondary | ICD-10-CM | POA: Diagnosis not present

## 2014-08-25 DIAGNOSIS — M6281 Muscle weakness (generalized): Secondary | ICD-10-CM | POA: Diagnosis not present

## 2014-08-25 DIAGNOSIS — E46 Unspecified protein-calorie malnutrition: Secondary | ICD-10-CM | POA: Diagnosis not present

## 2014-08-25 DIAGNOSIS — I471 Supraventricular tachycardia: Secondary | ICD-10-CM | POA: Diagnosis not present

## 2014-08-25 DIAGNOSIS — D649 Anemia, unspecified: Secondary | ICD-10-CM | POA: Diagnosis not present

## 2014-08-25 DIAGNOSIS — I10 Essential (primary) hypertension: Secondary | ICD-10-CM | POA: Diagnosis not present

## 2014-08-26 DIAGNOSIS — M6281 Muscle weakness (generalized): Secondary | ICD-10-CM | POA: Diagnosis not present

## 2014-08-26 DIAGNOSIS — I10 Essential (primary) hypertension: Secondary | ICD-10-CM | POA: Diagnosis not present

## 2014-08-26 DIAGNOSIS — I471 Supraventricular tachycardia: Secondary | ICD-10-CM | POA: Diagnosis not present

## 2014-08-26 DIAGNOSIS — K922 Gastrointestinal hemorrhage, unspecified: Secondary | ICD-10-CM | POA: Diagnosis not present

## 2014-08-26 DIAGNOSIS — E46 Unspecified protein-calorie malnutrition: Secondary | ICD-10-CM | POA: Diagnosis not present

## 2014-08-26 DIAGNOSIS — D649 Anemia, unspecified: Secondary | ICD-10-CM | POA: Diagnosis not present

## 2014-08-29 DIAGNOSIS — M6281 Muscle weakness (generalized): Secondary | ICD-10-CM | POA: Diagnosis not present

## 2014-08-29 DIAGNOSIS — D649 Anemia, unspecified: Secondary | ICD-10-CM | POA: Diagnosis not present

## 2014-08-29 DIAGNOSIS — I471 Supraventricular tachycardia: Secondary | ICD-10-CM | POA: Diagnosis not present

## 2014-08-29 DIAGNOSIS — I10 Essential (primary) hypertension: Secondary | ICD-10-CM | POA: Diagnosis not present

## 2014-08-29 DIAGNOSIS — E46 Unspecified protein-calorie malnutrition: Secondary | ICD-10-CM | POA: Diagnosis not present

## 2014-08-29 DIAGNOSIS — K922 Gastrointestinal hemorrhage, unspecified: Secondary | ICD-10-CM | POA: Diagnosis not present

## 2014-08-30 ENCOUNTER — Inpatient Hospital Stay: Payer: Medicare Other | Admitting: Internal Medicine

## 2014-08-31 ENCOUNTER — Telehealth: Payer: Self-pay | Admitting: Internal Medicine

## 2014-08-31 DIAGNOSIS — E46 Unspecified protein-calorie malnutrition: Secondary | ICD-10-CM | POA: Diagnosis not present

## 2014-08-31 DIAGNOSIS — I471 Supraventricular tachycardia: Secondary | ICD-10-CM | POA: Diagnosis not present

## 2014-08-31 DIAGNOSIS — K922 Gastrointestinal hemorrhage, unspecified: Secondary | ICD-10-CM | POA: Diagnosis not present

## 2014-08-31 DIAGNOSIS — I10 Essential (primary) hypertension: Secondary | ICD-10-CM | POA: Diagnosis not present

## 2014-08-31 DIAGNOSIS — M6281 Muscle weakness (generalized): Secondary | ICD-10-CM | POA: Diagnosis not present

## 2014-08-31 DIAGNOSIS — D649 Anemia, unspecified: Secondary | ICD-10-CM | POA: Diagnosis not present

## 2014-08-31 NOTE — Telephone Encounter (Signed)
Pt no show for an appt yesterday. Do we need to call and rs?

## 2014-09-01 DIAGNOSIS — D649 Anemia, unspecified: Secondary | ICD-10-CM | POA: Diagnosis not present

## 2014-09-01 DIAGNOSIS — K922 Gastrointestinal hemorrhage, unspecified: Secondary | ICD-10-CM | POA: Diagnosis not present

## 2014-09-01 DIAGNOSIS — M6281 Muscle weakness (generalized): Secondary | ICD-10-CM | POA: Diagnosis not present

## 2014-09-01 DIAGNOSIS — I10 Essential (primary) hypertension: Secondary | ICD-10-CM | POA: Diagnosis not present

## 2014-09-01 DIAGNOSIS — E46 Unspecified protein-calorie malnutrition: Secondary | ICD-10-CM | POA: Diagnosis not present

## 2014-09-01 DIAGNOSIS — I471 Supraventricular tachycardia: Secondary | ICD-10-CM | POA: Diagnosis not present

## 2014-09-02 DIAGNOSIS — I10 Essential (primary) hypertension: Secondary | ICD-10-CM | POA: Diagnosis not present

## 2014-09-02 DIAGNOSIS — M6281 Muscle weakness (generalized): Secondary | ICD-10-CM | POA: Diagnosis not present

## 2014-09-02 DIAGNOSIS — K922 Gastrointestinal hemorrhage, unspecified: Secondary | ICD-10-CM | POA: Diagnosis not present

## 2014-09-02 DIAGNOSIS — E46 Unspecified protein-calorie malnutrition: Secondary | ICD-10-CM | POA: Diagnosis not present

## 2014-09-02 DIAGNOSIS — I471 Supraventricular tachycardia: Secondary | ICD-10-CM | POA: Diagnosis not present

## 2014-09-02 DIAGNOSIS — D649 Anemia, unspecified: Secondary | ICD-10-CM | POA: Diagnosis not present

## 2014-09-02 NOTE — Telephone Encounter (Signed)
Pt stated she wills peak with her daughter and will call back and rs the appt

## 2014-09-02 NOTE — Telephone Encounter (Signed)
Yes, please try to call and reschedule.

## 2014-09-05 DIAGNOSIS — D649 Anemia, unspecified: Secondary | ICD-10-CM | POA: Diagnosis not present

## 2014-09-05 DIAGNOSIS — I471 Supraventricular tachycardia: Secondary | ICD-10-CM | POA: Diagnosis not present

## 2014-09-05 DIAGNOSIS — M6281 Muscle weakness (generalized): Secondary | ICD-10-CM | POA: Diagnosis not present

## 2014-09-05 DIAGNOSIS — E46 Unspecified protein-calorie malnutrition: Secondary | ICD-10-CM | POA: Diagnosis not present

## 2014-09-05 DIAGNOSIS — I10 Essential (primary) hypertension: Secondary | ICD-10-CM | POA: Diagnosis not present

## 2014-09-05 DIAGNOSIS — K922 Gastrointestinal hemorrhage, unspecified: Secondary | ICD-10-CM | POA: Diagnosis not present

## 2014-09-06 DIAGNOSIS — I10 Essential (primary) hypertension: Secondary | ICD-10-CM | POA: Diagnosis not present

## 2014-09-06 DIAGNOSIS — I471 Supraventricular tachycardia: Secondary | ICD-10-CM | POA: Diagnosis not present

## 2014-09-06 DIAGNOSIS — K922 Gastrointestinal hemorrhage, unspecified: Secondary | ICD-10-CM | POA: Diagnosis not present

## 2014-09-06 DIAGNOSIS — M6281 Muscle weakness (generalized): Secondary | ICD-10-CM | POA: Diagnosis not present

## 2014-09-06 DIAGNOSIS — D649 Anemia, unspecified: Secondary | ICD-10-CM | POA: Diagnosis not present

## 2014-09-06 DIAGNOSIS — E46 Unspecified protein-calorie malnutrition: Secondary | ICD-10-CM | POA: Diagnosis not present

## 2014-09-07 DIAGNOSIS — D649 Anemia, unspecified: Secondary | ICD-10-CM | POA: Diagnosis not present

## 2014-09-07 DIAGNOSIS — I10 Essential (primary) hypertension: Secondary | ICD-10-CM | POA: Diagnosis not present

## 2014-09-07 DIAGNOSIS — E46 Unspecified protein-calorie malnutrition: Secondary | ICD-10-CM | POA: Diagnosis not present

## 2014-09-07 DIAGNOSIS — I471 Supraventricular tachycardia: Secondary | ICD-10-CM | POA: Diagnosis not present

## 2014-09-07 DIAGNOSIS — K922 Gastrointestinal hemorrhage, unspecified: Secondary | ICD-10-CM | POA: Diagnosis not present

## 2014-09-07 DIAGNOSIS — M6281 Muscle weakness (generalized): Secondary | ICD-10-CM | POA: Diagnosis not present

## 2014-09-08 DIAGNOSIS — H4011X1 Primary open-angle glaucoma, mild stage: Secondary | ICD-10-CM | POA: Diagnosis not present

## 2014-09-08 DIAGNOSIS — Z961 Presence of intraocular lens: Secondary | ICD-10-CM | POA: Diagnosis not present

## 2014-09-09 DIAGNOSIS — Z853 Personal history of malignant neoplasm of breast: Secondary | ICD-10-CM

## 2014-09-09 DIAGNOSIS — D649 Anemia, unspecified: Secondary | ICD-10-CM | POA: Diagnosis not present

## 2014-09-09 DIAGNOSIS — Z9012 Acquired absence of left breast and nipple: Secondary | ICD-10-CM

## 2014-09-09 DIAGNOSIS — M6281 Muscle weakness (generalized): Secondary | ICD-10-CM | POA: Diagnosis not present

## 2014-09-09 DIAGNOSIS — E46 Unspecified protein-calorie malnutrition: Secondary | ICD-10-CM

## 2014-09-09 DIAGNOSIS — K922 Gastrointestinal hemorrhage, unspecified: Secondary | ICD-10-CM | POA: Diagnosis not present

## 2014-09-09 DIAGNOSIS — I471 Supraventricular tachycardia: Secondary | ICD-10-CM | POA: Diagnosis not present

## 2014-09-09 DIAGNOSIS — I10 Essential (primary) hypertension: Secondary | ICD-10-CM

## 2014-09-09 DIAGNOSIS — M1990 Unspecified osteoarthritis, unspecified site: Secondary | ICD-10-CM

## 2014-09-12 DIAGNOSIS — D649 Anemia, unspecified: Secondary | ICD-10-CM | POA: Diagnosis not present

## 2014-09-12 DIAGNOSIS — K922 Gastrointestinal hemorrhage, unspecified: Secondary | ICD-10-CM | POA: Diagnosis not present

## 2014-09-12 DIAGNOSIS — I471 Supraventricular tachycardia: Secondary | ICD-10-CM | POA: Diagnosis not present

## 2014-09-12 DIAGNOSIS — E46 Unspecified protein-calorie malnutrition: Secondary | ICD-10-CM | POA: Diagnosis not present

## 2014-09-12 DIAGNOSIS — I10 Essential (primary) hypertension: Secondary | ICD-10-CM | POA: Diagnosis not present

## 2014-09-12 DIAGNOSIS — M6281 Muscle weakness (generalized): Secondary | ICD-10-CM | POA: Diagnosis not present

## 2014-09-14 ENCOUNTER — Ambulatory Visit (INDEPENDENT_AMBULATORY_CARE_PROVIDER_SITE_OTHER): Payer: Medicare Other | Admitting: Internal Medicine

## 2014-09-14 ENCOUNTER — Ambulatory Visit: Payer: Medicare Other | Admitting: Internal Medicine

## 2014-09-14 ENCOUNTER — Encounter: Payer: Self-pay | Admitting: Internal Medicine

## 2014-09-14 VITALS — BP 152/84 | HR 63 | Temp 98.3°F | Resp 16 | Wt 157.0 lb

## 2014-09-14 DIAGNOSIS — K922 Gastrointestinal hemorrhage, unspecified: Secondary | ICD-10-CM | POA: Diagnosis not present

## 2014-09-14 DIAGNOSIS — M47899 Other spondylosis, site unspecified: Secondary | ICD-10-CM | POA: Diagnosis not present

## 2014-09-14 DIAGNOSIS — D62 Acute posthemorrhagic anemia: Secondary | ICD-10-CM | POA: Diagnosis not present

## 2014-09-14 MED ORDER — LISINOPRIL 40 MG PO TABS: 40.0000 mg | ORAL_TABLET | Freq: Every day | ORAL | Status: DC

## 2014-09-14 NOTE — Progress Notes (Signed)
Pre visit review using our clinic review tool, if applicable. No additional management support is needed unless otherwise documented below in the visit note. 

## 2014-09-16 NOTE — Progress Notes (Signed)
   Subjective:    Patient ID: [name], female    DOB: 09/21/1921, 81 y.o.   MRN: [id number]  HPI The patient is a 81 YO female who is coming in today for a follow up on her right knee replacement. She has done okay with recovery but not that great. She is still doing physical therapy on it. Is using assistive device for walking still. Pain is better but still 5/10 most of the time. No new complaints. Denies constipation and is off pain killers. Denies falls since being home.   Review of Systems  Constitutional: Negative for fever, chills, appetite change and fatigue.  HENT: Negative.   Eyes: Negative.   Respiratory: Negative for cough, chest tightness, shortness of breath and wheezing.   Cardiovascular: Negative for chest pain, palpitations and leg swelling.  Gastrointestinal: Negative for abdominal pain, diarrhea, constipation and abdominal distention.  Genitourinary: Negative for dysuria.  Musculoskeletal: Positive for back pain, arthralgias and gait problem.  Neurological: Negative for dizziness, weakness and light-headedness.      Objective:   Physical Exam  Constitutional: She is oriented to person, place, and time. She appears well-developed and well-nourished.  Skinny  HENT:  Head: Normocephalic and atraumatic.  Eyes: EOM are normal.  Neck: Normal range of motion.  Cardiovascular: Normal rate and regular rhythm.   Pulmonary/Chest: Effort normal and breath sounds normal. No respiratory distress. She has no wheezes. She has no rales.  Abdominal: Soft. Bowel sounds are normal.  Musculoskeletal:  Right knee replacement healing, no signs of infection  Neurological: She is alert and oriented to person, place, and time. Coordination abnormal.  Slow to stand  Skin: Skin is warm and dry.   Filed Vitals:   09/14/14 1000 09/14/14 1023  BP: 180/102 152/84  Pulse: 63   Temp: 98.3 F (36.8 C)   TempSrc: Oral   Resp: 16   Weight: 157 lb (71.215 kg)   SpO2: 99%         Assessment & Plan:

## 2014-09-16 NOTE — Assessment & Plan Note (Signed)
No recurrence since discharge and no dark stools. Will recheck CBC at next visit, stable on last check. Continue PPI and avoid NSAIDS.

## 2014-09-16 NOTE — Assessment & Plan Note (Signed)
Using tylenol for pain now. Still having pain and continuing with therapy s/p right knee replacement. Encouraged continued therapy. No problems with falls. CBC stable after surgery no need to recheck.

## 2014-09-16 NOTE — Assessment & Plan Note (Signed)
Stable while in in-patient rehabilitation and will not recheck today (she would like to avoid blood draw as she is still bruised from hospital). No more signs of continued bleeding and on PPI.

## 2014-09-16 NOTE — Patient Instructions (Signed)
We are not changing your medicines today. Come back in about 6 months.

## 2014-09-19 ENCOUNTER — Ambulatory Visit: Payer: Medicare Other | Admitting: Internal Medicine

## 2014-09-19 DIAGNOSIS — I10 Essential (primary) hypertension: Secondary | ICD-10-CM | POA: Diagnosis not present

## 2014-09-19 DIAGNOSIS — E46 Unspecified protein-calorie malnutrition: Secondary | ICD-10-CM | POA: Diagnosis not present

## 2014-09-19 DIAGNOSIS — D649 Anemia, unspecified: Secondary | ICD-10-CM | POA: Diagnosis not present

## 2014-09-19 DIAGNOSIS — M6281 Muscle weakness (generalized): Secondary | ICD-10-CM | POA: Diagnosis not present

## 2014-09-19 DIAGNOSIS — K922 Gastrointestinal hemorrhage, unspecified: Secondary | ICD-10-CM | POA: Diagnosis not present

## 2014-09-19 DIAGNOSIS — I471 Supraventricular tachycardia: Secondary | ICD-10-CM | POA: Diagnosis not present

## 2014-09-26 DIAGNOSIS — K922 Gastrointestinal hemorrhage, unspecified: Secondary | ICD-10-CM | POA: Diagnosis not present

## 2014-09-26 DIAGNOSIS — I10 Essential (primary) hypertension: Secondary | ICD-10-CM | POA: Diagnosis not present

## 2014-09-26 DIAGNOSIS — D649 Anemia, unspecified: Secondary | ICD-10-CM | POA: Diagnosis not present

## 2014-09-26 DIAGNOSIS — M6281 Muscle weakness (generalized): Secondary | ICD-10-CM | POA: Diagnosis not present

## 2014-09-26 DIAGNOSIS — E46 Unspecified protein-calorie malnutrition: Secondary | ICD-10-CM | POA: Diagnosis not present

## 2014-09-26 DIAGNOSIS — I471 Supraventricular tachycardia: Secondary | ICD-10-CM | POA: Diagnosis not present

## 2014-10-28 ENCOUNTER — Other Ambulatory Visit: Payer: Self-pay | Admitting: Geriatric Medicine

## 2014-10-28 ENCOUNTER — Telehealth: Payer: Self-pay | Admitting: Internal Medicine

## 2014-10-28 ENCOUNTER — Other Ambulatory Visit: Payer: Self-pay | Admitting: Internal Medicine

## 2014-10-28 MED ORDER — PANTOPRAZOLE SODIUM 40 MG PO TBEC
40.0000 mg | DELAYED_RELEASE_TABLET | Freq: Two times a day (BID) | ORAL | Status: DC
Start: 2014-10-28 — End: 2015-07-03

## 2014-10-28 MED ORDER — METOPROLOL TARTRATE 25 MG PO TABS: 37.5000 mg | ORAL_TABLET | Freq: Two times a day (BID) | ORAL | Status: DC

## 2014-10-28 NOTE — Telephone Encounter (Signed)
Sent to pharmacy 

## 2014-10-28 NOTE — Telephone Encounter (Signed)
Verified pharmacy is [facility] on [address]- requesting refill of     metoprolol tartrate (LOPRESSOR) 25 MG tablet [[id number]]      pantoprazole (PROTONIX) 40 MG tablet [[id number]]

## 2014-11-15 ENCOUNTER — Ambulatory Visit: Payer: Medicare Other | Admitting: *Deleted

## 2014-11-15 VITALS — BP 172/80

## 2014-11-15 DIAGNOSIS — I10 Essential (primary) hypertension: Secondary | ICD-10-CM

## 2014-11-15 NOTE — Progress Notes (Signed)
BP high and patient will be scheduled for office visit in the next 1-2 weeks.

## 2014-11-25 ENCOUNTER — Ambulatory Visit: Payer: Medicare Other | Admitting: Internal Medicine

## 2014-11-30 ENCOUNTER — Ambulatory Visit (INDEPENDENT_AMBULATORY_CARE_PROVIDER_SITE_OTHER): Payer: Medicare Other | Admitting: Internal Medicine

## 2014-11-30 ENCOUNTER — Other Ambulatory Visit (INDEPENDENT_AMBULATORY_CARE_PROVIDER_SITE_OTHER): Payer: Medicare Other

## 2014-11-30 ENCOUNTER — Encounter: Payer: Self-pay | Admitting: Internal Medicine

## 2014-11-30 VITALS — BP 170/100 | HR 63 | Temp 98.6°F | Resp 12 | Wt 153.0 lb

## 2014-11-30 DIAGNOSIS — I10 Essential (primary) hypertension: Secondary | ICD-10-CM | POA: Diagnosis not present

## 2014-11-30 DIAGNOSIS — D62 Acute posthemorrhagic anemia: Secondary | ICD-10-CM | POA: Diagnosis not present

## 2014-11-30 LAB — CBC
HCT: 36.6 % (ref 36.0–46.0)
Hemoglobin: 11.6 g/dL — ABNORMAL LOW (ref 12.0–15.0)
MCHC: 31.7 g/dL (ref 30.0–36.0)
MCV: 76.9 fl — ABNORMAL LOW (ref 78.0–100.0)
Platelets: 400 10*3/uL (ref 150.0–400.0)
RBC: 4.76 Mil/uL (ref 3.87–5.11)
RDW: 18.6 % — ABNORMAL HIGH (ref 11.5–15.5)
WBC: 5.3 10*3/uL (ref 4.0–10.5)

## 2014-11-30 LAB — COMPREHENSIVE METABOLIC PANEL
ALT: 5 U/L (ref 0–35)
AST: 12 U/L (ref 0–37)
Albumin: 4 g/dL (ref 3.5–5.2)
Alkaline Phosphatase: 83 U/L (ref 39–117)
BILIRUBIN TOTAL: 0.5 mg/dL (ref 0.2–1.2)
BUN: 17 mg/dL (ref 6–23)
CO2: 28 meq/L (ref 19–32)
Calcium: 9.5 mg/dL (ref 8.4–10.5)
Chloride: 105 mEq/L (ref 96–112)
Creatinine, Ser: 0.83 mg/dL (ref 0.40–1.20)
GFR: 82.9 mL/min (ref >60.00)
GLUCOSE: 84 mg/dL (ref 70–99)
Potassium: 3.4 mEq/L — ABNORMAL LOW (ref 3.5–5.1)
SODIUM: 140 meq/L (ref 135–145)
TOTAL PROTEIN: 7.3 g/dL (ref 6.0–8.3)

## 2014-11-30 NOTE — Patient Instructions (Signed)
We are going to check the blood work today and call you back with the results.   The blood pressure is doing okay and we will continue to have you take 1 and 1/2 pills of the metoprolol twice a day and 1 pill of the lisinopril daily.   It is okay to take tylenol up to 3000 mg daily. If you have the 500 mg pills that would be 6 pills per day that it is safe to take when you need it for pain.

## 2014-11-30 NOTE — Progress Notes (Signed)
Pre visit review using our clinic review tool, if applicable. No additional management support is needed unless otherwise documented below in the visit note. 

## 2014-12-02 NOTE — Assessment & Plan Note (Signed)
BP elevated today but there was some confusion about dosing. Will have her take 1.5 pills metoprolol BID and still with lisinopril. If still elevated next time (BP was better last time) will change lisinopril to lisinopril/hctz.

## 2014-12-02 NOTE — Assessment & Plan Note (Signed)
Checking CBC today, should have had time to recover.

## 2014-12-02 NOTE — Progress Notes (Signed)
   Subjective:    Patient ID: [name], female    DOB: Apr 24, 1922, 81 y.o.   MRN: [id number]  HPI The patient is a 81 YO female coming in for follow up of her blood pressure. She is not sure whether she should be taking 1.5 metoprolol bid. She thought she was supposed to be taking 1 pill bid. No headaches, chest pain. No SOB or nausea. Doing well overall. Walking better now from the knee replacement.   Review of Systems  Constitutional: Negative for fever, chills, appetite change and fatigue.  Respiratory: Negative for cough, chest tightness, shortness of breath and wheezing.   Cardiovascular: Negative for chest pain, palpitations and leg swelling.  Gastrointestinal: Negative for abdominal pain, diarrhea, constipation and abdominal distention.  Genitourinary: Negative for dysuria.  Musculoskeletal: Positive for gait problem.  Neurological: Negative for dizziness, weakness and light-headedness.      Objective:   Physical Exam  Constitutional: She is oriented to person, place, and time. She appears well-developed and well-nourished.  Skinny  HENT:  Head: Normocephalic and atraumatic.  Eyes: EOM are normal.  Neck: Normal range of motion.  Cardiovascular: Normal rate and regular rhythm.   Pulmonary/Chest: Effort normal and breath sounds normal. No respiratory distress. She has no wheezes. She has no rales.  Abdominal: Soft. Bowel sounds are normal.  Neurological: She is alert and oriented to person, place, and time.  Slow to stand  Skin: Skin is warm and dry.   Filed Vitals:   11/30/14 1022  BP: 170/100  Pulse: 63  Temp: 98.6 F (37 C)  TempSrc: Oral  Resp: 12  Weight: 153 lb (69.4 kg)  SpO2: 98%      Assessment & Plan:

## 2014-12-07 ENCOUNTER — Other Ambulatory Visit: Payer: Self-pay | Admitting: Internal Medicine

## 2014-12-19 DIAGNOSIS — H4011X1 Primary open-angle glaucoma, mild stage: Secondary | ICD-10-CM | POA: Diagnosis not present

## 2015-02-21 DIAGNOSIS — Z1231 Encounter for screening mammogram for malignant neoplasm of breast: Secondary | ICD-10-CM | POA: Diagnosis not present

## 2015-03-08 ENCOUNTER — Encounter: Payer: Self-pay | Admitting: Internal Medicine

## 2015-03-23 DIAGNOSIS — H401131 Primary open-angle glaucoma, bilateral, mild stage: Secondary | ICD-10-CM | POA: Diagnosis not present

## 2015-03-30 ENCOUNTER — Encounter: Payer: Self-pay | Admitting: Internal Medicine

## 2015-03-30 ENCOUNTER — Ambulatory Visit (INDEPENDENT_AMBULATORY_CARE_PROVIDER_SITE_OTHER): Payer: Medicare Other | Admitting: Internal Medicine

## 2015-03-30 VITALS — BP 180/86 | HR 63 | Temp 98.3°F | Resp 12 | Ht 68.0 in | Wt 154.0 lb

## 2015-03-30 DIAGNOSIS — I10 Essential (primary) hypertension: Secondary | ICD-10-CM | POA: Diagnosis not present

## 2015-03-30 DIAGNOSIS — Z23 Encounter for immunization: Secondary | ICD-10-CM | POA: Diagnosis not present

## 2015-03-30 DIAGNOSIS — R413 Other amnesia: Secondary | ICD-10-CM

## 2015-03-30 DIAGNOSIS — M47899 Other spondylosis, site unspecified: Secondary | ICD-10-CM

## 2015-03-30 MED ORDER — DICLOFENAC SODIUM 1 % TD GEL: 2.0000 g | Freq: Four times a day (QID) | TRANSDERMAL | Status: DC

## 2015-03-30 MED ORDER — AMLODIPINE BESYLATE 10 MG PO TABS
10.0000 mg | ORAL_TABLET | Freq: Every day | ORAL | Status: DC
Start: 2015-03-30 — End: 2016-03-17

## 2015-03-30 NOTE — Patient Instructions (Addendum)
We have given you the pneumonia shot today and will not check blood work.   We will get you in with a memory specialist to check on the memory.   We will add a medicine for the blood pressure called amlodipine. It is a once a day medicine that should help with the pressure. I think that the headache could be coming from the pressure.

## 2015-03-30 NOTE — Progress Notes (Signed)
Pre visit review using our clinic review tool, if applicable. No additional management support is needed unless otherwise documented below in the visit note. 

## 2015-03-31 NOTE — Assessment & Plan Note (Signed)
Adding amlodipine 10 mg daily as her blood pressure is consistently elevated. No orthostasis symptoms. Will check labs on new therapy. No signs of damage from the blood pressure.

## 2015-03-31 NOTE — Progress Notes (Signed)
   Subjective:    Patient ID: [name], female    DOB: May 20, 1922, 81 y.o.   MRN: [id number]  HPI The patient is a 81 YO female coming in for follow up of her blood pressure and for knee pain. She used to have voltaren gel which helped with the pain but she is out. She would like to get that back. She has been having high blood pressures at home and denies headache, chest pains, SOB, stroke symptoms. Takes her medicines everyday. Wants to go to neurologist to get her memory checked.   Review of Systems  Constitutional: Negative for fever, chills, appetite change and fatigue.  Respiratory: Negative for cough, chest tightness, shortness of breath and wheezing.   Cardiovascular: Negative for chest pain, palpitations and leg swelling.  Gastrointestinal: Negative for abdominal pain, diarrhea, constipation and abdominal distention.  Genitourinary: Negative for dysuria.  Musculoskeletal: Positive for gait problem.  Neurological: Negative for dizziness, weakness and light-headedness.      Objective:   Physical Exam  Constitutional: She is oriented to person, place, and time. She appears well-developed and well-nourished.  Skinny  HENT:  Head: Normocephalic and atraumatic.  Eyes: EOM are normal.  Neck: Normal range of motion.  Cardiovascular: Normal rate and regular rhythm.   Pulmonary/Chest: Effort normal and breath sounds normal. No respiratory distress. She has no wheezes. She has no rales.  Abdominal: Soft. Bowel sounds are normal.  Neurological: She is alert and oriented to person, place, and time.  Slow to stand  Skin: Skin is warm and dry.   Filed Vitals:   03/30/15 1014 03/30/15 1141  BP: 182/92 180/86  Pulse: 63   Temp: 98.3 F (36.8 C)   TempSrc: Oral   Resp: 12   Height: 5\' 8"  (1.727 m)   Weight: 154 lb (69.854 kg)   SpO2: 98%       Assessment & Plan:  Prevnar 13 given at visit.

## 2015-03-31 NOTE — Assessment & Plan Note (Signed)
Avoid oral NSAIDs with her uncontrolled htn but okay with refill of voltaren since it is helping.

## 2015-04-23 ENCOUNTER — Other Ambulatory Visit: Payer: Self-pay | Admitting: Internal Medicine

## 2015-04-24 ENCOUNTER — Other Ambulatory Visit: Payer: Self-pay | Admitting: Internal Medicine

## 2015-05-22 ENCOUNTER — Other Ambulatory Visit: Payer: Self-pay | Admitting: Internal Medicine

## 2015-06-01 ENCOUNTER — Other Ambulatory Visit: Payer: Self-pay | Admitting: Internal Medicine

## 2015-06-26 DIAGNOSIS — H401131 Primary open-angle glaucoma, bilateral, mild stage: Secondary | ICD-10-CM | POA: Diagnosis not present

## 2015-07-03 ENCOUNTER — Encounter: Payer: Self-pay | Admitting: Internal Medicine

## 2015-07-03 ENCOUNTER — Ambulatory Visit (INDEPENDENT_AMBULATORY_CARE_PROVIDER_SITE_OTHER): Payer: Medicare Other | Admitting: Internal Medicine

## 2015-07-03 VITALS — BP 150/90 | HR 63 | Temp 98.2°F | Resp 12 | Ht 68.0 in | Wt 160.0 lb

## 2015-07-03 DIAGNOSIS — I10 Essential (primary) hypertension: Secondary | ICD-10-CM | POA: Diagnosis not present

## 2015-07-03 MED ORDER — DICLOFENAC SODIUM 1 % TD GEL: 2.0000 g | Freq: Four times a day (QID) | TRANSDERMAL | Status: DC

## 2015-07-03 NOTE — Progress Notes (Signed)
   Subjective:    Patient ID: [name], female    DOB: 12/31/1921, 82 y.o.   MRN: [id number]  HPI The patient is a 82 YO female coming in for follow up of her blood pressure. She is doing better with low salt diet and still taking her lisinopril and metoprolol and amlodipine. She denies side effects from the medicine. No chest pains or SOB. No abdominal pain. Some more joint pain with the cold weather recently.   Review of Systems  Constitutional: Negative for fever, chills, appetite change and fatigue.  Respiratory: Negative for cough, chest tightness, shortness of breath and wheezing.   Cardiovascular: Negative for chest pain, palpitations and leg swelling.  Gastrointestinal: Negative for abdominal pain, diarrhea, constipation and abdominal distention.  Genitourinary: Negative for dysuria.  Musculoskeletal: Positive for gait problem.  Skin: Negative.   Neurological: Negative for dizziness, weakness and light-headedness.      Objective:   Physical Exam  Constitutional: She is oriented to person, place, and time. She appears well-developed and well-nourished.  Skinny  HENT:  Head: Normocephalic and atraumatic.  Eyes: EOM are normal.  Neck: Normal range of motion.  Cardiovascular: Normal rate and regular rhythm.   Pulmonary/Chest: Effort normal and breath sounds normal. No respiratory distress. She has no wheezes. She has no rales.  Abdominal: Soft. Bowel sounds are normal. She exhibits no distension. There is no tenderness. There is no rebound.  Neurological: She is alert and oriented to person, place, and time.  Slow to stand  Skin: Skin is warm and dry.   Filed Vitals:   07/03/15 1321  BP: 150/90  Pulse: 63  Temp: 98.2 F (36.8 C)  TempSrc: Oral  Resp: 12  Height: 5\' 8"  (1.727 m)  Weight: 160 lb (72.576 kg)  SpO2: 98%      Assessment & Plan:

## 2015-07-03 NOTE — Progress Notes (Signed)
Pre visit review using our clinic review tool, if applicable. No additional management support is needed unless otherwise documented below in the visit note. 

## 2015-07-03 NOTE — Patient Instructions (Signed)
We do not need to check your blood work today.   The blood pressure is doing well at 150/90 today. This is right at your goal so keep up the good work!  It is okay for you to have treats that you like sometimes including some meat.

## 2015-07-03 NOTE — Assessment & Plan Note (Signed)
BP at goal today on lisinopril, amlodipine, and metoprolol. Recent labs without indication for change and no adjustment.

## 2015-07-27 ENCOUNTER — Telehealth: Payer: Self-pay | Admitting: Internal Medicine

## 2015-07-27 ENCOUNTER — Encounter (HOSPITAL_COMMUNITY): Payer: Self-pay | Admitting: *Deleted

## 2015-07-27 ENCOUNTER — Emergency Department (HOSPITAL_COMMUNITY): Payer: Medicare Other

## 2015-07-27 ENCOUNTER — Emergency Department (HOSPITAL_COMMUNITY)
Admission: EM | Admit: 2015-07-27 | Discharge: 2015-07-27 | Disposition: A | Discharge status: Home / Self Care | Payer: Medicare Other | Attending: Emergency Medicine | Admitting: Emergency Medicine

## 2015-07-27 DIAGNOSIS — R42 Dizziness and giddiness: Secondary | ICD-10-CM | POA: Insufficient documentation

## 2015-07-27 DIAGNOSIS — R531 Weakness: Secondary | ICD-10-CM | POA: Insufficient documentation

## 2015-07-27 DIAGNOSIS — I471 Supraventricular tachycardia: Secondary | ICD-10-CM | POA: Diagnosis not present

## 2015-07-27 DIAGNOSIS — I10 Essential (primary) hypertension: Secondary | ICD-10-CM | POA: Insufficient documentation

## 2015-07-27 DIAGNOSIS — Z8719 Personal history of other diseases of the digestive system: Secondary | ICD-10-CM | POA: Diagnosis not present

## 2015-07-27 DIAGNOSIS — Z8639 Personal history of other endocrine, nutritional and metabolic disease: Secondary | ICD-10-CM | POA: Insufficient documentation

## 2015-07-27 DIAGNOSIS — Z853 Personal history of malignant neoplasm of breast: Secondary | ICD-10-CM | POA: Diagnosis not present

## 2015-07-27 DIAGNOSIS — M1990 Unspecified osteoarthritis, unspecified site: Secondary | ICD-10-CM | POA: Insufficient documentation

## 2015-07-27 DIAGNOSIS — Z79899 Other long term (current) drug therapy: Secondary | ICD-10-CM | POA: Diagnosis not present

## 2015-07-27 DIAGNOSIS — H409 Unspecified glaucoma: Secondary | ICD-10-CM | POA: Insufficient documentation

## 2015-07-27 DIAGNOSIS — Z791 Long term (current) use of non-steroidal anti-inflammatories (NSAID): Secondary | ICD-10-CM | POA: Insufficient documentation

## 2015-07-27 LAB — URINALYSIS, ROUTINE W REFLEX MICROSCOPIC
Bilirubin Urine: NEGATIVE
Glucose, UA: NEGATIVE mg/dL
Hgb urine dipstick: NEGATIVE
Ketones, ur: NEGATIVE mg/dL
Leukocytes, UA: NEGATIVE
Nitrite: NEGATIVE
PROTEIN: NEGATIVE mg/dL
Specific Gravity, Urine: 1.016 (ref 1.005–1.030)
pH: 5.5 (ref 5.0–8.0)

## 2015-07-27 LAB — CBC
HCT: 38.6 % (ref 36.0–46.0)
Hemoglobin: 12.2 g/dL (ref 12.0–15.0)
MCH: 26.8 pg (ref 26.0–34.0)
MCHC: 31.6 g/dL (ref 30.0–36.0)
MCV: 84.6 fL (ref 78.0–100.0)
PLATELETS: 394 10*3/uL (ref 150–400)
RBC: 4.56 MIL/uL (ref 3.87–5.11)
RDW: 14.9 % (ref 11.5–15.5)
WBC: 5.2 10*3/uL (ref 4.0–10.5)

## 2015-07-27 LAB — I-STAT TROPONIN, ED: Troponin i, poc: 0 ng/mL (ref 0.00–0.08)

## 2015-07-27 LAB — BASIC METABOLIC PANEL
Anion gap: 9 (ref 5–15)
BUN: 17 mg/dL (ref 6–20)
CO2: 27 mmol/L (ref 22–32)
CREATININE: 0.92 mg/dL (ref 0.44–1.00)
Calcium: 9.5 mg/dL (ref 8.9–10.3)
Chloride: 109 mmol/L (ref 101–111)
GFR calc Af Amer: >60 mL/min (ref >60)
GFR calc non Af Amer: 53 mL/min — ABNORMAL LOW (ref >60)
Glucose, Bld: 93 mg/dL (ref 65–99)
Potassium: 4.5 mmol/L (ref 3.5–5.1)
SODIUM: 145 mmol/L (ref 135–145)

## 2015-07-27 MED ORDER — SODIUM CHLORIDE 0.9 % IV BOLUS (SEPSIS): 1000.0000 mL | Freq: Once | INTRAVENOUS | Status: DC

## 2015-07-27 MED ORDER — SODIUM CHLORIDE 0.9 % IV BOLUS (SEPSIS)
1000.0000 mL | Freq: Once | INTRAVENOUS | Status: AC
  Administered 2015-07-27: 1000 mL via INTRAVENOUS

## 2015-07-27 NOTE — ED Notes (Signed)
Patient placed on cardiac monitoring.

## 2015-07-27 NOTE — ED Notes (Signed)
Pt states she woke up last night at 2 am and she had to lay back down d/t dizziness.  States hx of same and her pcp told her it's r/t htn.  She came in today b/c the dizziness lasted longer.  States dizziness somewhat resolved at this time.  No focal deficits.

## 2015-07-27 NOTE — Telephone Encounter (Signed)
Recommend acute visit within 24 hours if not willing to go to ER.

## 2015-07-27 NOTE — Telephone Encounter (Signed)
Patient Name: [name]  DOB: 02/04/1921    Initial Comment Caller states, grandmother has dizzy since 2 am, feeling faint    Nurse Assessment      Guidelines    Guideline Title Affirmed Question Affirmed Notes  Dizziness - Lightheadedness SEVERE dizziness (e.g., unable to stand, requires support to walk, feels like passing out now)    Final Disposition User   Go to ED Now (or PCP triage) Orvan Seen, RN, [place]    Referrals  GO TO FACILITY UNDECIDED   Disagree/Comply: Disagree  Disagree/Comply Reason: Wait and see   Caller states she was feeling a little better but something still didn't feel right

## 2015-07-27 NOTE — ED Notes (Signed)
Pt ambulated in hall independently with cain without difficulty. No dizziness. MD notified.

## 2015-07-27 NOTE — ED Provider Notes (Signed)
CSN: [id number]     Arrival date & time 07/27/15  1006 History   First MD Initiated Contact with Patient 07/27/15 1549     Chief Complaint  Patient presents with  . Dizziness     (Consider location/radiation/quality/duration/timing/severity/associated sxs/prior Treatment) The history is provided by the patient and medical records. No language interpreter was used.     [name] is a 82 y.o. female  with a hx of HTN, breast cancer (2011), glaucoma, UGIB, SVT presents to the Emergency Department complaining of acute onset of dizziness after waking at 2am.  Pt reports when she sat up to go to the bathroom she became acutely dizzy without CP, nausea or SOB requiring that she lay down again.  She reports she felt some better and attempted again approx 3-75min later, but again felt very week and dizzy.  She describes this as being off balance and feeling as if she might faint or fall.  She reports the symptoms have waxed and waned but have been persistent since the onset.  She was able to ambulate with assistance to the car to come the the ED.  Pt reports mild nausea at this time, but no vomiting.   No fever, urinary symptoms, vision changes.  Pt's daughter at bedside reports she checked on her mother at 4am.  At that time the patient was sitting in a chair in the living room and c/o feeling dizzy but was not confused and did not have facial droop or slurred speech. Pt reports she has had episodes intermittently in the past which her PCP has attributed to HTN, but the symptoms have never been persistent.      Past Medical History  Diagnosis Date  . Hypertension   . Cancer [facility]) 2011, 2012    left breast cancer 2011 central excision,2012 mastectomy  . Glaucoma   . Arthritis   . Upper GI bleed 01/2013.     Melena. HPylori negative gastritis on EGD.   [name] Complication of anesthesia     shortness of breath after procedure  . SVT (supraventricular tachycardia) ([place])   . Protein-calorie  malnutrition, severe ([place])    Past Surgical History  Procedure Laterality Date  . Cholecystectomy    . Breast surgery  2011  . Breast surgery  2012    lt breast masty  . Abdominal hysterectomy    . Esophagogastroduodenoscopy N/A 01/25/2013    Procedure: ESOPHAGOGASTRODUODENOSCOPY (EGD);  Surgeon: Jerene Bears, MD;  Location: [place];  Service: Gastroenterology;  Laterality: N/A;  . Esophagogastroduodenoscopy (egd) with propofol  08/04/2014    Procedure: ESOPHAGOGASTRODUODENOSCOPY (EGD) WITH PROPOFOL;  Surgeon: Inda Castle, MD;  Location: [facility] ENDOSCOPY;  Service: Endoscopy;;   Family History  Problem Relation Age of Onset  . Coronary artery disease Mother 78    MI   Social History  Substance Use Topics  . Smoking status: Never Smoker   . Smokeless tobacco: Never Used  . Alcohol Use: No   OB History    No data available     Review of Systems  Constitutional: Negative for fever, diaphoresis, appetite change, fatigue and unexpected weight change.  HENT: Negative for mouth sores.   Eyes: Negative for visual disturbance.  Respiratory: Negative for cough, chest tightness, shortness of breath and wheezing.   Cardiovascular: Negative for chest pain.  Gastrointestinal: Negative for nausea, vomiting, abdominal pain, diarrhea and constipation.  Endocrine: Negative for polydipsia, polyphagia and polyuria.  Genitourinary: Negative for dysuria, urgency, frequency and hematuria.  Musculoskeletal: Negative for back pain and neck stiffness.  Skin: Negative for rash.  Allergic/Immunologic: Negative for immunocompromised state.  Neurological: Positive for dizziness and weakness (generalized). Negative for syncope, light-headedness and headaches.  Hematological: Does not bruise/bleed easily.  Psychiatric/Behavioral: Negative for sleep disturbance. The patient is not nervous/anxious.       Allergies  Codeine  Home Medications   Prior to Admission medications   Medication Sig  Start Date End Date Taking? Authorizing Provider  acetaminophen (TYLENOL) 325 MG tablet Take 650 mg by mouth every 6 (six) hours as needed for pain.    Yes Historical Provider, MD  amLODipine (NORVASC) 10 MG tablet Take 1 tablet (10 mg total) by mouth daily. 03/30/15  Yes Hoyt Koch, MD  diclofenac sodium (VOLTAREN) 1 % GEL Apply 2 g topically 4 (four) times daily. 07/03/15  Yes Hoyt Koch, MD  lisinopril (PRINIVIL,ZESTRIL) 40 MG tablet Take 1 tablet (40 mg total) by mouth daily. 09/14/14  Yes Hoyt Koch, MD  metoprolol tartrate (LOPRESSOR) 25 MG tablet TAKE 1 AND 1/2 TABLETS BY MOUTH TWICE DAILY 06/02/15  Yes Hoyt Koch, MD  pantoprazole (PROTONIX) 40 MG tablet TAKE 1 TABLET BY MOUTH EVERY DAY 04/24/15  Yes Hoyt Koch, MD  travoprost, benzalkonium, (TRAVATAN) 0.004 % ophthalmic solution Place 1 drop into both eyes at bedtime. 08/19/14  Yes Daniel J Angiulli, PA-C   BP 159/74 mmHg  Pulse 66  Temp(Src) 98.8 F (37.1 C) (Oral)  Resp 18  SpO2 98% Physical Exam  Constitutional: She is oriented to person, place, and time. She appears well-developed and well-nourished. No distress.  HENT:  Head: Normocephalic and atraumatic.  Left Ear: Tympanic membrane, external ear and ear canal normal.  Nose: Nose normal. No epistaxis. Right sinus exhibits no maxillary sinus tenderness and no frontal sinus tenderness. Left sinus exhibits no maxillary sinus tenderness and no frontal sinus tenderness.  Mouth/Throat: Uvula is midline, oropharynx is clear and moist and mucous membranes are normal. Mucous membranes are not pale and not cyanotic. No oropharyngeal exudate, posterior oropharyngeal edema, posterior oropharyngeal erythema or tonsillar abscesses.  Moderate cerumen impaction of the right canal  Eyes: Conjunctivae and EOM are normal. Pupils are equal, round, and reactive to light. No scleral icterus.  No horizontal, vertical or rotational nystagmus  Neck:  Normal range of motion and full passive range of motion without pain. Neck supple.  Full active and passive ROM without pain No midline or paraspinal tenderness No nuchal rigidity or meningeal signs  Cardiovascular: Normal rate, regular rhythm, normal heart sounds and intact distal pulses.   No murmur heard. Pulmonary/Chest: Effort normal and breath sounds normal. No stridor. No respiratory distress. She has no wheezes. She has no rales.  Clear and equal breath sounds without focal wheezes, rhonchi, rales  Abdominal: Soft. Bowel sounds are normal. There is no tenderness. There is no rebound and no guarding.  Musculoskeletal: Normal range of motion. She exhibits no edema.  Lymphadenopathy:    She has no cervical adenopathy.  Neurological: She is alert and oriented to person, place, and time. She has normal reflexes. No cranial nerve deficit. She exhibits normal muscle tone. Coordination normal.  Mental Status:  Alert, oriented, thought content appropriate. Speech fluent without evidence of aphasia. Able to follow 2 step commands without difficulty.  Cranial Nerves:  II:  Peripheral visual fields grossly normal, pupils equal, round, reactive to light (cataracts noted) III,IV, VI: ptosis not present, extra-ocular motions intact bilaterally  V,VII: smile symmetric, facial  light touch sensation equal VIII: hearing grossly normal bilaterally  IX,X: midline uvula rise  XI: bilateral shoulder shrug equal and strong XII: midline tongue extension  Motor:  5/5 in upper and lower extremities bilaterally including strong and equal grip strength and dorsiflexion/plantar flexion Sensory: Pinprick and light touch normal in all extremities.  Deep Tendon Reflexes: 2+ and symmetric  Cerebellar: normal finger-to-nose with bilateral upper extremities Gait: deferred CV: distal pulses palpable throughout   Skin: Skin is warm and dry. No rash noted. She is not diaphoretic.  Psychiatric: She has a normal mood  and affect. Her behavior is normal. Judgment and thought content normal.  Nursing note and vitals reviewed.   ED Course  Procedures (including critical care time) Labs Review Labs Reviewed  BASIC METABOLIC PANEL - Abnormal; Notable for the following:    GFR calc non Af Amer 53 (*)    All other components within normal limits  CBC  URINALYSIS, ROUTINE W REFLEX MICROSCOPIC (NOT AT [facility])  Randolm Idol, ED    Imaging Review Mr Brain Wo Contrast  07/27/2015  CLINICAL DATA:  82 year old female with acute dizziness since 0200 hours. Initial encounter. EXAM: MRI HEAD WITHOUT CONTRAST TECHNIQUE: Multiplanar, multiecho pulse sequences of the brain and surrounding structures were obtained without intravenous contrast. COMPARISON:  Head CT without contrast 02/13/2005. FINDINGS: No restricted diffusion to suggest acute infarction. No midline shift, mass effect, evidence of mass lesion, ventriculomegaly, extra-axial collection or acute intracranial hemorrhage. Cervicomedullary junction and pituitary are within normal limits. Major intracranial vascular flow voids are preserved. Pneumatized posterior right clinoid process re- demonstrated. Patchy bilateral cerebral white matter T2 and FLAIR hyperintensity, mostly in the periventricular and central white matter. Similar patchy T2 hyperintensity in the deep gray matter nuclei, especially the thalami. Brainstem relatively spared. Chronic cyst or lacune at the right cauda thalamic groove. No cortical encephalomalacia or chronic cerebral blood products are identified. There is a small chronic lacunar infarct in the right cerebellum best seen on series 9, image 18. Visible internal auditory structures appear normal. Trace bilateral mastoid fluid. Negative nasopharynx. Trace paranasal sinus mucosal thickening. Postoperative changes to both globes. Negative orbit and scalp soft tissues. Normal bone marrow signal. Negative visualized cervical spine. IMPRESSION: 1.  No  acute intracranial abnormality. 2. Moderate for age signal changes in the brain most compatible with chronic small vessel disease. Electronically Signed   By: Genevie Ann M.D.   On: 07/27/2015 20:49   I have personally reviewed and evaluated these images and lab results as part of my medical decision-making.   EKG Interpretation   Date/Time:  Thursday July 27 2015 10:34:46 EST Ventricular Rate:  61 PR Interval:  246 QRS Duration: 102 QT Interval:  426 QTC Calculation: 428 R Axis:   -76 Text Interpretation:  Sinus rhythm with 1st degree A-V block Left anterior  fascicular block Septal infarct , age undetermined Abnormal ECG Confirmed  by BELFI  MD, MELANIE [id number]) on 07/27/2015 9:52:56 PM      MDM   Final diagnoses:  Dizziness  Lightheadedness    [name] presents with complaints of dizziness and lightheadedness. Gait testing deferred due to persistent symptoms. Will give fluid bolus. Labs are reassuring. Urinalysis without evidence of urinary tract infection. Will obtain brain MRI to rule out cerebellar CVA.  9PM MRI brain without acute intracranial abnormality. Moderate for age signal changes in the brain compatible with chronic small vessel disease and small chronic lacunar infarct. No evidence of acute CVA.  10:23 PM  She ambulates in the hall with a steady gait, using her cane but otherwise unassisted. She reports feeling well. She denies feeling dizzy or lightheaded at this time.  We'll discharge to home with close PCP follow-up. Lab work and MRI findings discussed with patient and daughter. Questions were answered.  They're comfortable with the plan.  The patient was discussed with and seen by Dr. Tamera Punt who agrees with the treatment plan.    Jarrett Soho Oneill Bais, PA-C 07/27/15 [place], MD 07/27/15 2245

## 2015-07-27 NOTE — Telephone Encounter (Signed)
Looks like she's at the ER now.

## 2015-07-27 NOTE — Telephone Encounter (Signed)
error 

## 2015-07-27 NOTE — ED Notes (Signed)
Patient stated she felt dizzy when she stood up

## 2015-07-27 NOTE — ED Notes (Signed)
Pt verbalized understanding of d/c instructions and has no further questions. Pt stable and NAD.  

## 2015-07-27 NOTE — Discharge Instructions (Signed)
1. Medications: usual home medications 2. Treatment: rest, drink plenty of fluids,  3. Follow Up: Please followup with your primary doctor in 2-3 days for discussion of your diagnoses and further evaluation after today's visit; if you do not have a primary care doctor use the resource guide provided to find one; Please return to the ER for return or worsening of symptoms    Dizziness Dizziness is a common problem. It is a feeling of unsteadiness or light-headedness. You may feel like you are about to faint. Dizziness can lead to injury if you stumble or fall. Anyone can become dizzy, but dizziness is more common in older adults. This condition can be caused by a number of things, including medicines, dehydration, or illness. HOME CARE INSTRUCTIONS Taking these steps may help with your condition: Eating and Drinking  Drink enough fluid to keep your urine clear or pale yellow. This helps to keep you from becoming dehydrated. Try to drink more clear fluids, such as water.  Do not drink alcohol.  Limit your caffeine intake if directed by your health care provider.  Limit your salt intake if directed by your health care provider. Activity  Avoid making quick movements.  Rise slowly from chairs and steady yourself until you feel okay.  In the morning, first sit up on the side of the bed. When you feel okay, stand slowly while you hold onto something until you know that your balance is fine.  Move your legs often if you need to stand in one place for a long time. Tighten and relax your muscles in your legs while you are standing.  Do not drive or operate heavy machinery if you feel dizzy.  Avoid bending down if you feel dizzy. Place items in your home so that they are easy for you to reach without leaning over. Lifestyle  Do not use any tobacco products, including cigarettes, chewing tobacco, or electronic cigarettes. If you need help quitting, ask your health care provider.  Try to  reduce your stress level, such as with yoga or meditation. Talk with your health care provider if you need help. General Instructions  Watch your dizziness for any changes.  Take medicines only as directed by your health care provider. Talk with your health care provider if you think that your dizziness is caused by a medicine that you are taking.  Tell a friend or a family member that you are feeling dizzy. If he or she notices any changes in your behavior, have this person call your health care provider.  Keep all follow-up visits as directed by your health care provider. This is important. SEEK MEDICAL CARE IF:  Your dizziness does not go away.  Your dizziness or light-headedness gets worse.  You feel nauseous.  You have reduced hearing.  You have new symptoms.  You are unsteady on your feet or you feel like the room is spinning. SEEK IMMEDIATE MEDICAL CARE IF:  You vomit or have diarrhea and are unable to eat or drink anything.  You have problems talking, walking, swallowing, or using your arms, hands, or legs.  You feel generally weak.  You are not thinking clearly or you have trouble forming sentences. It may take a friend or family member to notice this.  You have chest pain, abdominal pain, shortness of breath, or sweating.  Your vision changes.  You notice any bleeding.  You have a headache.  You have neck pain or a stiff neck.  You have a fever.  This information is not intended to replace advice given to you by your health care provider. Make sure you discuss any questions you have with your health care provider.   Document Released: 11/13/2000 Document Revised: 10/04/2014 Document Reviewed: 05/16/2014 Elsevier Interactive Patient Education [employer].

## 2015-08-14 ENCOUNTER — Encounter: Payer: Self-pay | Admitting: Internal Medicine

## 2015-08-14 ENCOUNTER — Ambulatory Visit (INDEPENDENT_AMBULATORY_CARE_PROVIDER_SITE_OTHER): Payer: Medicare Other | Admitting: Internal Medicine

## 2015-08-14 VITALS — BP 170/80 | HR 64 | Temp 98.3°F | Resp 12 | Ht 68.0 in | Wt 164.0 lb

## 2015-08-14 DIAGNOSIS — D62 Acute posthemorrhagic anemia: Secondary | ICD-10-CM

## 2015-08-14 DIAGNOSIS — I10 Essential (primary) hypertension: Secondary | ICD-10-CM | POA: Diagnosis not present

## 2015-08-14 MED ORDER — TRIAMCINOLONE ACETONIDE 0.1 % EX CREA: 1.0000 "application " | TOPICAL_CREAM | Freq: Two times a day (BID) | CUTANEOUS | Status: DC

## 2015-08-14 NOTE — Assessment & Plan Note (Signed)
No recent bleeding and reviewed CBC from the ER with further improvement of her Hg to normal. Will continue to monitor.

## 2015-08-14 NOTE — Assessment & Plan Note (Signed)
BP mildly elevated today, overall acceptable on amlodipine and lisinopril and metoprolol. Continue and recent BMP without signs of CKD. Does have old stroke complicating her hypertension. We discussed risk of stroke in the future.

## 2015-08-14 NOTE — Progress Notes (Signed)
   Subjective:    Patient ID: [name], female    DOB: Apr 21, 1922, 82 y.o.   MRN: [id number]  HPI The patient is a 82 YO female coming in for ER follow up (in for dizziness, they ruled out infection and new stroke, BP mildly elevated there). She is doing well since being home and no recurrent symptoms. No headaches or chest pains. Taking her blood pressure medicine regularly. She does have a small rash on her hip that she wants a cream for. No fevers or chills. No pain on the rash just mild itching.   Review of Systems  Constitutional: Negative for fever, chills, appetite change and fatigue.  Respiratory: Negative for cough, chest tightness, shortness of breath and wheezing.   Cardiovascular: Negative for chest pain, palpitations and leg swelling.  Gastrointestinal: Negative for abdominal pain, diarrhea, constipation and abdominal distention.  Genitourinary: Negative for dysuria.  Musculoskeletal: Positive for gait problem.  Skin: Negative.   Neurological: Negative for dizziness, weakness and light-headedness.      Objective:   Physical Exam  Constitutional: She is oriented to person, place, and time. She appears well-developed and well-nourished.  Skinny  HENT:  Head: Normocephalic and atraumatic.  Left and right TM normal no wax obstructing.   Eyes: EOM are normal.  Neck: Normal range of motion.  Cardiovascular: Normal rate and regular rhythm.   Pulmonary/Chest: Effort normal and breath sounds normal. No respiratory distress. She has no wheezes. She has no rales.  Abdominal: Soft. Bowel sounds are normal. She exhibits no distension. There is no tenderness. There is no rebound.  Neurological: She is alert and oriented to person, place, and time.  Slow to stand  Skin: Skin is warm and dry.   Filed Vitals:   08/14/15 1342  BP: 170/80  Pulse: 64  Temp: 98.3 F (36.8 C)  TempSrc: Oral  Resp: 12  Height: 5\' 8"  (1.727 m)  Weight: 164 lb (74.39 kg)  SpO2: 98%        Assessment & Plan:

## 2015-08-14 NOTE — Patient Instructions (Signed)
We will not change the medicines today and no blood work today.   We have sent in a cream to help with the rash that you use twice a day for 1 week or until the rash is gone.

## 2015-08-14 NOTE — Progress Notes (Signed)
Pre visit review using our clinic review tool, if applicable. No additional management support is needed unless otherwise documented below in the visit note. 

## 2015-08-18 ENCOUNTER — Other Ambulatory Visit: Payer: Self-pay | Admitting: Internal Medicine

## 2015-08-28 ENCOUNTER — Other Ambulatory Visit: Payer: Self-pay | Admitting: Internal Medicine

## 2015-09-25 DIAGNOSIS — H401131 Primary open-angle glaucoma, bilateral, mild stage: Secondary | ICD-10-CM | POA: Diagnosis not present

## 2015-09-25 DIAGNOSIS — Z961 Presence of intraocular lens: Secondary | ICD-10-CM | POA: Diagnosis not present

## 2015-11-23 ENCOUNTER — Other Ambulatory Visit: Payer: Self-pay | Admitting: Internal Medicine

## 2015-11-29 ENCOUNTER — Ambulatory Visit (INDEPENDENT_AMBULATORY_CARE_PROVIDER_SITE_OTHER): Payer: Medicare Other | Admitting: Internal Medicine

## 2015-11-29 ENCOUNTER — Encounter: Payer: Self-pay | Admitting: Internal Medicine

## 2015-11-29 VITALS — BP 176/92 | HR 67 | Temp 98.2°F | Resp 12 | Ht 68.0 in | Wt 165.0 lb

## 2015-11-29 DIAGNOSIS — I10 Essential (primary) hypertension: Secondary | ICD-10-CM | POA: Diagnosis not present

## 2015-11-29 DIAGNOSIS — M47899 Other spondylosis, site unspecified: Secondary | ICD-10-CM | POA: Diagnosis not present

## 2015-11-29 MED ORDER — TRIAMCINOLONE ACETONIDE 0.1 % EX CREA: 1.0000 "application " | TOPICAL_CREAM | Freq: Two times a day (BID) | CUTANEOUS | Status: DC

## 2015-11-29 NOTE — Patient Instructions (Signed)
We have sent in the refills today.  We do not need blood work today since we just got some at the end of February.   Come back for wellness in about 4-5 months

## 2015-11-29 NOTE — Progress Notes (Signed)
Pre visit review using our clinic review tool, if applicable. No additional management support is needed unless otherwise documented below in the visit note. 

## 2015-12-01 NOTE — Progress Notes (Signed)
   Subjective:    Patient ID: [name], female    DOB: [birth year], 82 y.o.   MRN: [id number]  HPI The patient is a 82 YO female coming in for follow up of her blood pressure. She is not sure of her medications and her granddaughter with her does not know her medications either. She is possibly taking her usual medicines. She is still struggling with her knee and back pain and takes tylenol for it when needed.   Review of Systems  Constitutional: Negative for fever, chills, appetite change and fatigue.  Respiratory: Negative for cough, chest tightness, shortness of breath and wheezing.   Cardiovascular: Negative for chest pain, palpitations and leg swelling.  Gastrointestinal: Negative for abdominal pain, diarrhea, constipation and abdominal distention.  Genitourinary: Negative for dysuria.  Musculoskeletal: Positive for arthralgias and gait problem.  Skin: Negative.   Neurological: Negative for dizziness, weakness, light-headedness and headaches.      Objective:   Physical Exam  Constitutional: She is oriented to person, place, and time. She appears well-developed and well-nourished.  Skinny  HENT:  Head: Normocephalic and atraumatic.  Eyes: EOM are normal.  Neck: Normal range of motion.  Cardiovascular: Normal rate and regular rhythm.   Pulmonary/Chest: Effort normal and breath sounds normal. No respiratory distress. She has no wheezes. She has no rales.  Abdominal: Soft. Bowel sounds are normal. She exhibits no distension. There is no tenderness. There is no rebound.  Neurological: She is alert and oriented to person, place, and time.  Slow to stand  Skin: Skin is warm and dry.   Filed Vitals:   11/29/15 0938  BP: 176/92  Pulse: 67  Temp: 98.2 F (36.8 C)  TempSrc: Oral  Resp: 12  Height: 5\' 8"  (1.727 m)  Weight: 165 lb (74.844 kg)  SpO2: 96%      Assessment & Plan:

## 2015-12-01 NOTE — Assessment & Plan Note (Signed)
Taking amlodipine and lisinopril and metoprolol for the BP. Recent CMP normal and no changes needed. BP is mildly elevated today but has been better in the past.

## 2015-12-01 NOTE — Assessment & Plan Note (Signed)
Uses voltaren gel and tylenol as needed.

## 2015-12-20 ENCOUNTER — Other Ambulatory Visit: Payer: Self-pay | Admitting: Internal Medicine

## 2015-12-25 DIAGNOSIS — H401131 Primary open-angle glaucoma, bilateral, mild stage: Secondary | ICD-10-CM | POA: Diagnosis not present

## 2016-02-18 ENCOUNTER — Other Ambulatory Visit: Payer: Self-pay | Admitting: Internal Medicine

## 2016-02-19 ENCOUNTER — Other Ambulatory Visit: Payer: Self-pay | Admitting: Internal Medicine

## 2016-03-14 IMAGING — MR MR HEAD W/O CM
9 of 10 series · 36 of 48 positions shown · non-contrast
Comparison: Head CT without contrast 02/13/2005.

CLINICAL DATA: [AGE] female with acute dizziness since 5855
hours. Initial encounter.

EXAM:
MRI HEAD WITHOUT CONTRAST
TECHNIQUE: Multiplanar, multiecho pulse sequences of the brain and surrounding
structures were obtained without intravenous contrast.

[Series 3: T1 · sagittal · 5.0mm · 0.47mm/px · 1 of 23 slices shown]
[im 1/23]
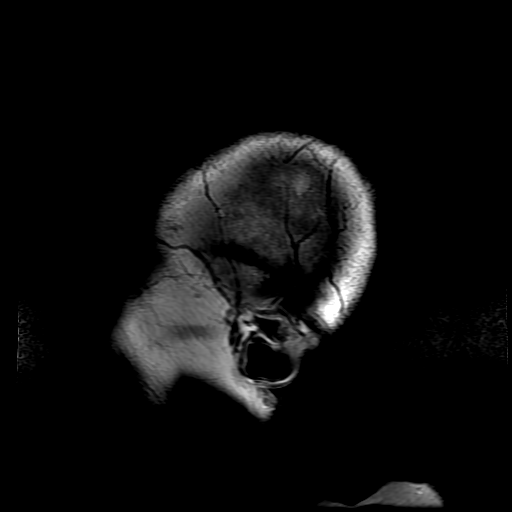

[Series 4: DWI · axial · 3.0mm · 1.09mm/px · z∈[-121,+9]mm · 9 of 90 slices shown (1 of 4)]
[im 1/90]
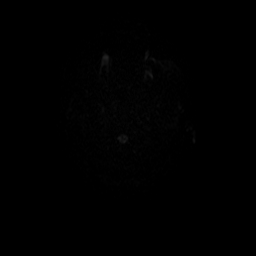
[im 12/90]
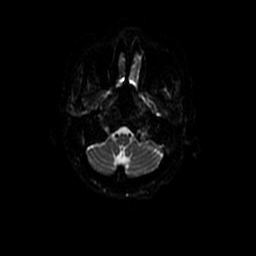
[im 23/90]
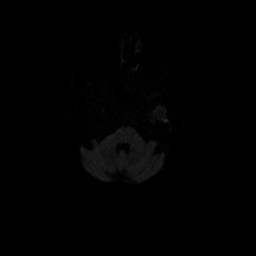
[im 34/90]
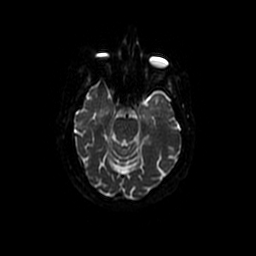
[im 45/90]
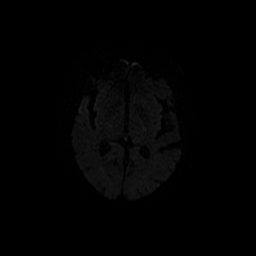
[im 56/90]
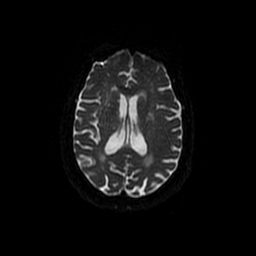
[im 67/90]
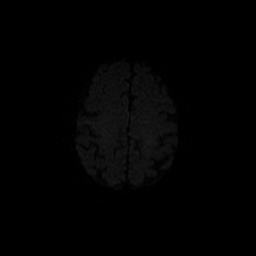
[im 78/90]
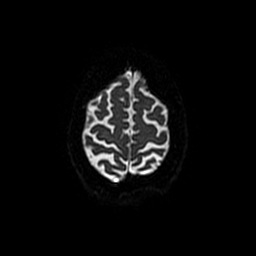
[im 90/90]
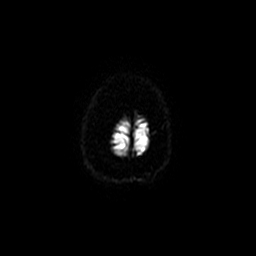

[Series 5: T2 · axial · 5.0mm · 0.43mm/px · z∈[-114,+23]mm · 3 of 24 slices shown (1 of 2)]
[im 1/24]
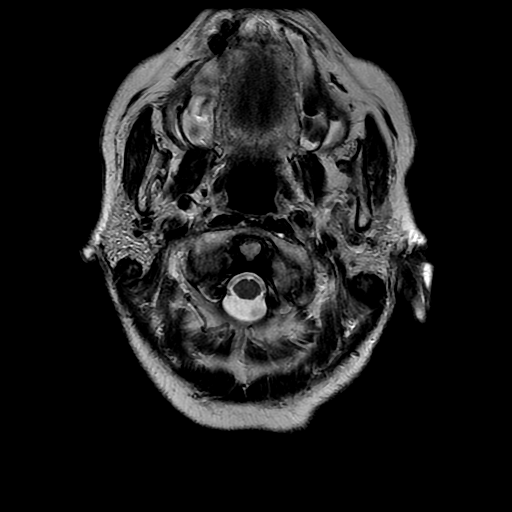
[im 12/24]
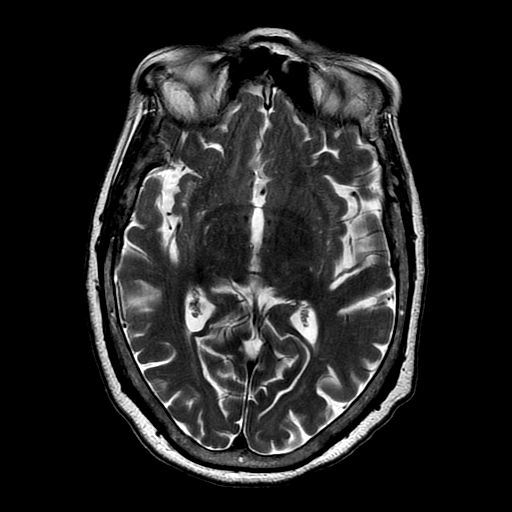
[im 24/24]
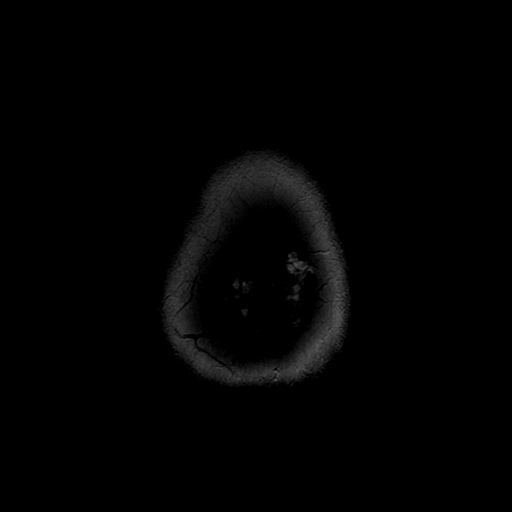

[Series 6: DWI · coronal · 5.0mm · 1.09mm/px · 7 of 66 slices shown (2 of 4)]
[im 1/66]
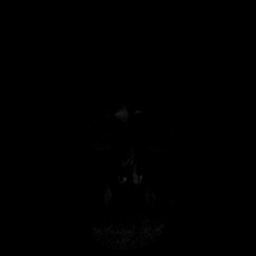
[im 11/66]
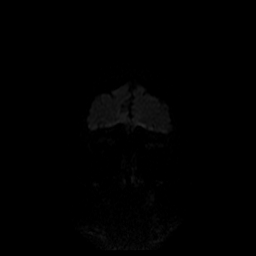
[im 22/66]
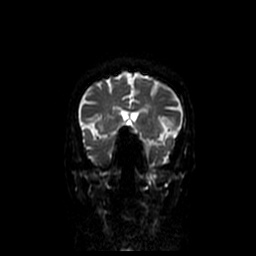
[im 33/66]
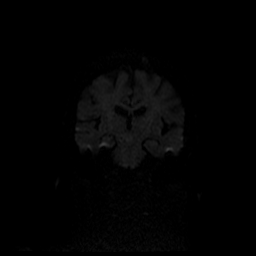
[im 44/66]
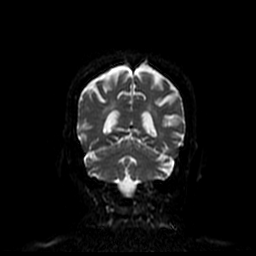
[im 55/66]
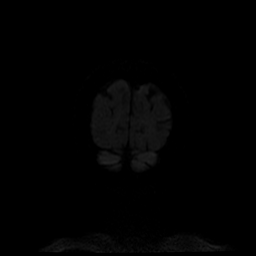
[im 66/66]
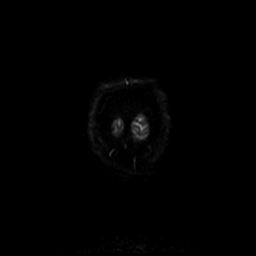

[Series 7: FLAIR · axial · 5.0mm · 0.43mm/px · z∈[-114,+23]mm · 3 of 24 slices shown]
[im 1/24]
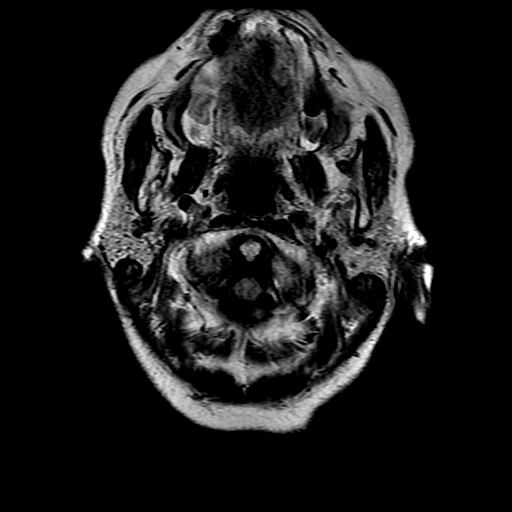
[im 12/24]
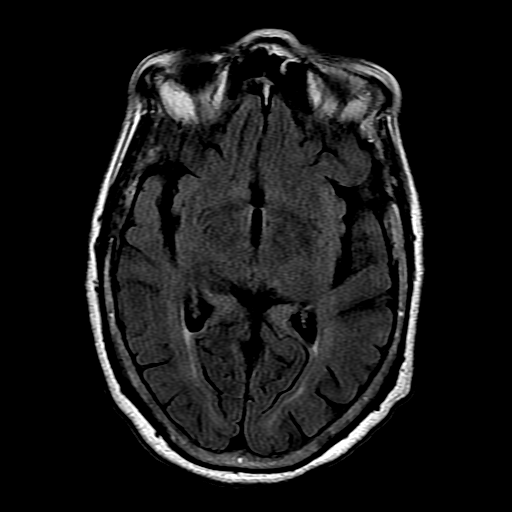
[im 24/24]
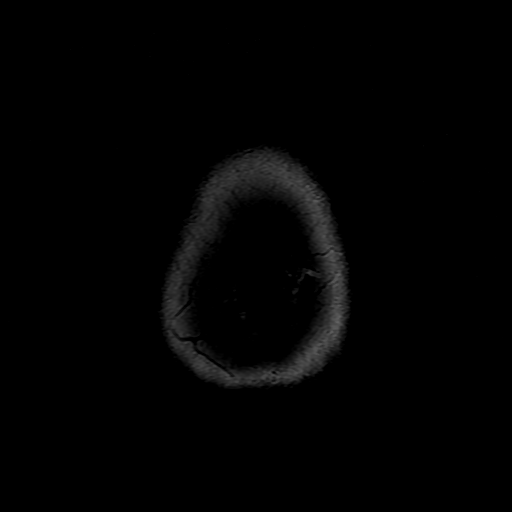

[Series 8: ax mpgr · axial · 5.0mm · 0.43mm/px · 1 of 24 slices shown]
[im 1/24]
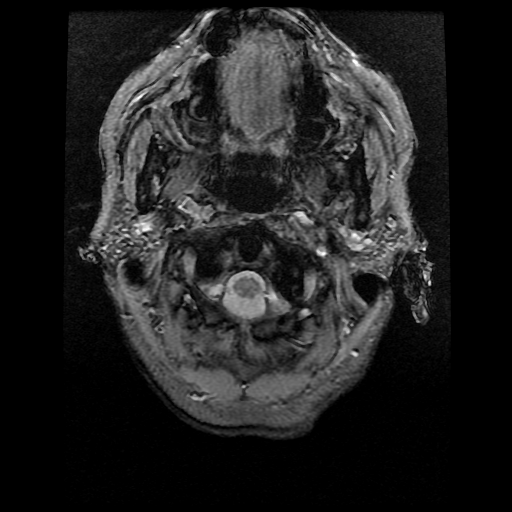

[Series 10: T2 · coronal · 5.0mm · 0.43mm/px · 3 of 28 slices shown (2 of 2)]
[im 1/28]
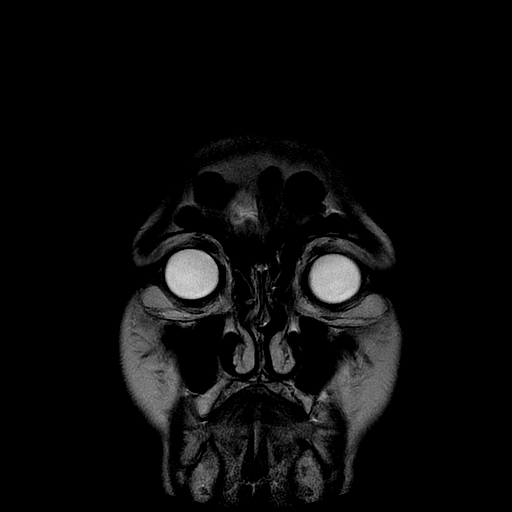
[im 14/28]
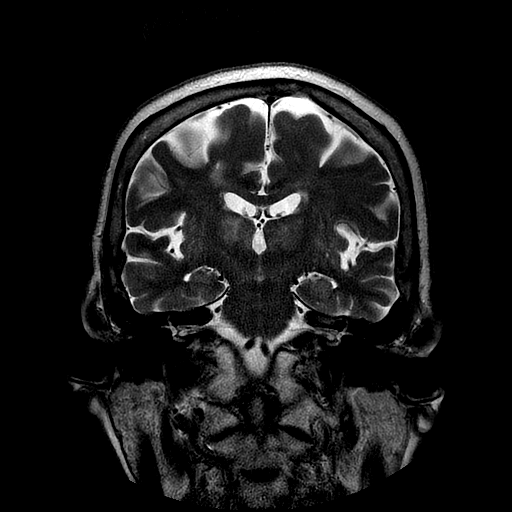
[im 28/28]
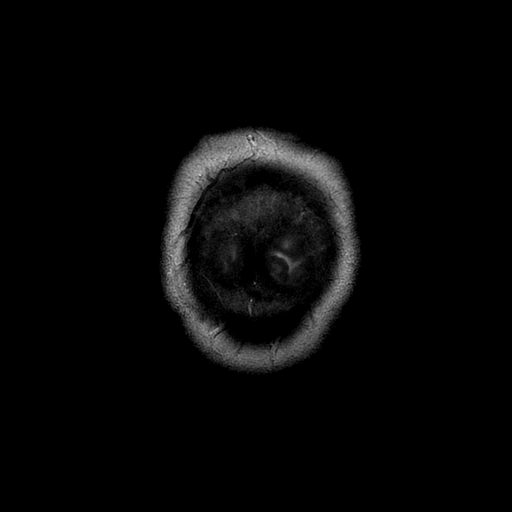

[Series 400: DWI · axial · 3.0mm · 1.09mm/px · z∈[-121,+9]mm · 5 of 45 slices shown (3 of 4)]
[im 1/45]
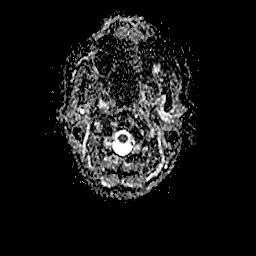
[im 12/45]
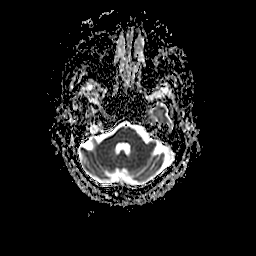
[im 23/45]
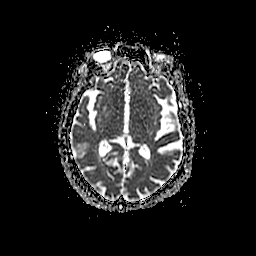
[im 34/45]
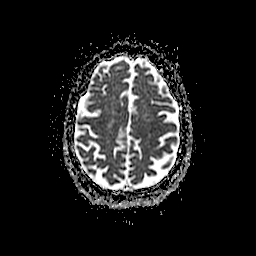
[im 45/45]
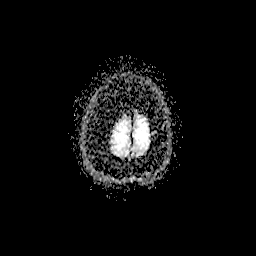

[Series 600: DWI · coronal · 5.0mm · 1.09mm/px · 4 of 33 slices shown (4 of 4)]
[im 1/33]
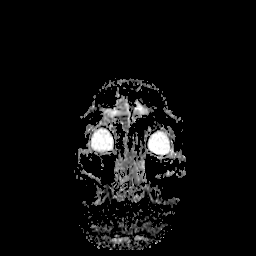
[im 11/33]
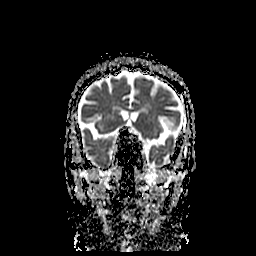
[im 22/33]
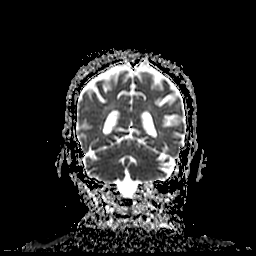
[im 33/33]
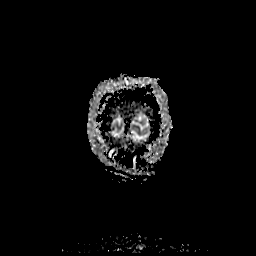

[36 of 48 positions shown; findings below may reference images not displayed]

FINDINGS: No restricted diffusion to suggest acute infarction. No midline
shift, mass effect, evidence of mass lesion, ventriculomegaly,
extra-axial collection or acute intracranial hemorrhage.
Cervicomedullary junction and pituitary are within normal limits.
Major intracranial vascular flow voids are preserved. Pneumatized
posterior right clinoid process re- demonstrated.

Patchy bilateral cerebral white matter T2 and FLAIR hyperintensity,
mostly in the periventricular and central white matter. Similar
patchy T2 hyperintensity in the deep gray matter nuclei, especially
the thalami. Brainstem relatively spared. Chronic cyst or lacune at
the right cauda thalamic groove. No cortical encephalomalacia or
chronic cerebral blood products are identified. There is a small
chronic lacunar infarct in the right cerebellum best seen on series
9, image 18.

Visible internal auditory structures appear normal. Trace bilateral
mastoid fluid. Negative nasopharynx. Trace paranasal sinus mucosal
thickening. Postoperative changes to both globes. Negative orbit and
scalp soft tissues. Normal bone marrow signal. Negative visualized
cervical spine.
IMPRESSION: 1.  No acute intracranial abnormality.
2. Moderate for age signal changes in the brain most compatible with
chronic small vessel disease.

## 2016-03-17 ENCOUNTER — Other Ambulatory Visit: Payer: Self-pay | Admitting: Internal Medicine

## 2016-04-01 DIAGNOSIS — H401131 Primary open-angle glaucoma, bilateral, mild stage: Secondary | ICD-10-CM | POA: Diagnosis not present

## 2016-04-09 ENCOUNTER — Other Ambulatory Visit: Payer: Self-pay | Admitting: Internal Medicine

## 2016-04-15 ENCOUNTER — Telehealth: Payer: Self-pay

## 2016-04-15 NOTE — Telephone Encounter (Signed)
PA APPROVED through 06/02/2017. Pharmacy advised via Fax

## 2016-04-15 NOTE — Telephone Encounter (Signed)
PA initiated via [place] line [phone] Ref # [id number].  Clinical response up to 72 hours via fax

## 2016-04-16 ENCOUNTER — Encounter: Payer: Self-pay | Admitting: Internal Medicine

## 2016-04-16 ENCOUNTER — Other Ambulatory Visit (INDEPENDENT_AMBULATORY_CARE_PROVIDER_SITE_OTHER): Payer: Medicare Other

## 2016-04-16 ENCOUNTER — Ambulatory Visit (INDEPENDENT_AMBULATORY_CARE_PROVIDER_SITE_OTHER): Payer: Medicare Other | Admitting: Internal Medicine

## 2016-04-16 VITALS — BP 142/76 | HR 69 | Temp 97.9°F | Resp 14 | Ht 68.0 in | Wt 173.1 lb

## 2016-04-16 DIAGNOSIS — I10 Essential (primary) hypertension: Secondary | ICD-10-CM

## 2016-04-16 DIAGNOSIS — D62 Acute posthemorrhagic anemia: Secondary | ICD-10-CM

## 2016-04-16 LAB — CBC
HCT: 40.2 % (ref 36.0–46.0)
Hemoglobin: 13.1 g/dL (ref 12.0–15.0)
MCHC: 32.5 g/dL (ref 30.0–36.0)
MCV: 83.4 fl (ref 78.0–100.0)
Platelets: 446 10*3/uL — ABNORMAL HIGH (ref 150.0–400.0)
RBC: 4.82 Mil/uL (ref 3.87–5.11)
RDW: 15 % (ref 11.5–15.5)
WBC: 6.9 10*3/uL (ref 4.0–10.5)

## 2016-04-16 LAB — COMPREHENSIVE METABOLIC PANEL
ALT: 6 U/L (ref 0–35)
AST: 11 U/L (ref 0–37)
Albumin: 3.9 g/dL (ref 3.5–5.2)
Alkaline Phosphatase: 107 U/L (ref 39–117)
BILIRUBIN TOTAL: 0.5 mg/dL (ref 0.2–1.2)
BUN: 18 mg/dL (ref 6–23)
CALCIUM: 9.2 mg/dL (ref 8.4–10.5)
CO2: 27 meq/L (ref 19–32)
CREATININE: 0.91 mg/dL (ref 0.40–1.20)
Chloride: 105 mEq/L (ref 96–112)
GFR: 74.32 mL/min (ref >60.00)
Glucose, Bld: 88 mg/dL (ref 70–99)
Potassium: 4 mEq/L (ref 3.5–5.1)
Sodium: 140 mEq/L (ref 135–145)
Total Protein: 7.5 g/dL (ref 6.0–8.3)

## 2016-04-16 MED ORDER — TRIAMCINOLONE ACETONIDE 0.1 % EX CREA: 1.0000 "application " | TOPICAL_CREAM | Freq: Two times a day (BID) | CUTANEOUS | 11 refills | Status: DC

## 2016-04-16 NOTE — Patient Instructions (Signed)
We will check the blood work today to make sure everything is doing well.

## 2016-04-16 NOTE — Assessment & Plan Note (Signed)
BP close to goal on recheck. Due to her fragility and fall risk would not add another agent to try to push her BP down further. She is willing to try compression stockings for the mild leg swelling. We talked about how this could be a side effect of the amlodipine. She previously has had side effects from too many BP meds.

## 2016-04-16 NOTE — Progress Notes (Signed)
Pre visit review using our clinic review tool, if applicable. No additional management support is needed unless otherwise documented below in the visit note. 

## 2016-04-16 NOTE — Progress Notes (Signed)
   Subjective:    Patient ID: [name], female    DOB: 1921/10/27, 82 y.o.   MRN: [id number]  HPI The patient is a 82 YO female coming in for follow up of her blood pressure. She is taking her medications as prescribed. She denies side effects. She thinks that the amlodipine is slightly hard to swallow but she is doing that well. Maybe giving her some mild swelling in her legs but this does not bother her much.   Review of Systems  Constitutional: Negative for appetite change, fatigue, fever and unexpected weight change.  Respiratory: Negative.   Cardiovascular: Negative.   Gastrointestinal: Negative.   Musculoskeletal: Positive for arthralgias, gait problem and myalgias. Negative for joint swelling, neck pain and neck stiffness.  Skin: Negative.   Psychiatric/Behavioral: Negative.       Objective:   Physical Exam  Constitutional: She is oriented to person, place, and time. She appears well-developed and well-nourished.  HENT:  Head: Normocephalic and atraumatic.  Eyes: EOM are normal.  Neck: Normal range of motion.  Cardiovascular: Normal rate and regular rhythm.   Pulmonary/Chest: Effort normal. No respiratory distress. She has no wheezes. She has no rales.  Abdominal: Soft. Bowel sounds are normal. She exhibits no distension. There is no tenderness. There is no rebound.  Neurological: She is alert and oriented to person, place, and time. Coordination abnormal.  Uses cane for ambulation and some hesitation to the gait.   Skin: Skin is warm and dry.   Vitals:   04/16/16 1113 04/16/16 1132  BP: (!) 170/80 (!) 142/76  Pulse: 69   Resp: 14   Temp: 97.9 F (36.6 C)   TempSrc: Oral   SpO2: 97%   Weight: 173 lb 1.9 oz (78.5 kg)   Height: 5\' 8"  (1.727 m)       Assessment & Plan:

## 2016-04-16 NOTE — Assessment & Plan Note (Signed)
Checking CBC for stability, no recurrent loss described at home.

## 2016-05-14 ENCOUNTER — Other Ambulatory Visit: Payer: Self-pay | Admitting: Internal Medicine

## 2016-05-16 ENCOUNTER — Other Ambulatory Visit: Payer: Self-pay | Admitting: Internal Medicine

## 2016-06-13 ENCOUNTER — Ambulatory Visit (INDEPENDENT_AMBULATORY_CARE_PROVIDER_SITE_OTHER): Payer: Medicare Other | Admitting: Internal Medicine

## 2016-06-13 ENCOUNTER — Encounter: Payer: Self-pay | Admitting: Internal Medicine

## 2016-06-13 ENCOUNTER — Ambulatory Visit: Payer: Medicare Other | Admitting: Internal Medicine

## 2016-06-13 DIAGNOSIS — M159 Polyosteoarthritis, unspecified: Secondary | ICD-10-CM

## 2016-06-13 DIAGNOSIS — M150 Primary generalized (osteo)arthritis: Secondary | ICD-10-CM | POA: Diagnosis not present

## 2016-06-13 NOTE — Patient Instructions (Signed)
We have sent in a new cream for the leg for pain. We have also refilled the old one.   It is safe and okay to take tylenol 2 pills in the morning, 2 pills at lunch, and 2 pills in the evening.

## 2016-06-13 NOTE — Progress Notes (Signed)
   Subjective:    Patient ID: [name], female    DOB: Jan 24, 1922, 83 y.o.   MRN: [id number]  HPI The patient is a 83 YO female coming in for dizziness. She fell at home in the kitchen after her leg collapsed on her. She did hit her head while falling but denies LOC. No nausea or vomiting after the fall. She denies injury or bleeding from her head and no other significant injuries. She was able to walk again after the incident. She has a lump on her head and this is decreasing. No headaches. No confusion.   Review of Systems  Constitutional: Negative for activity change, appetite change, fatigue, fever and unexpected weight change.  Respiratory: Negative.   Cardiovascular: Negative.   Gastrointestinal: Negative.   Musculoskeletal: Positive for arthralgias, gait problem and myalgias. Negative for back pain, joint swelling, neck pain and neck stiffness.  Neurological: Positive for weakness. Negative for dizziness, facial asymmetry, light-headedness, numbness and headaches.      Objective:   Physical Exam  Constitutional: She is oriented to person, place, and time. She appears well-developed and well-nourished.  HENT:  Head: Normocephalic and atraumatic.  Right Ear: External ear normal.  Left Ear: External ear normal.  Nose: Nose normal.  Small lump on the posterior right skull which is not tender to touch, no skin breakdown or injury  Eyes: EOM are normal. Pupils are equal, round, and reactive to light.  Neck: Normal range of motion. No JVD present.  Cardiovascular: Normal rate and regular rhythm.   Pulmonary/Chest: Effort normal and breath sounds normal.  Abdominal: Soft. Bowel sounds are normal. She exhibits no distension. There is no tenderness. There is no rebound.  Lymphadenopathy:    She has no cervical adenopathy.  Neurological: She is alert and oriented to person, place, and time. No cranial nerve deficit. Coordination abnormal.  Slow gait but steady   Vitals:   06/13/16 1619  BP: 140/90  Pulse: 77  Resp: 14  Temp: 98.4 F (36.9 C)  TempSrc: Oral  SpO2: 98%  Weight: 174 lb (78.9 kg)  Height: 5\' 8"  (1.727 m)      Assessment & Plan:

## 2016-06-13 NOTE — Progress Notes (Signed)
Pre visit review using our clinic review tool, if applicable. No additional management support is needed unless otherwise documented below in the visit note. 

## 2016-06-14 MED ORDER — LIDOCAINE 4 % EX LOTN: TOPICAL_LOTION | CUTANEOUS | 0 refills | Status: DC

## 2016-06-14 NOTE — Assessment & Plan Note (Signed)
Rx for voltaren gel refilled and new rx for lidocaine 4% lotion to help with pain as well. Have talked to her about getting a brace for her leg but she is not sure that would be helpful and she will be careful.

## 2016-06-16 ENCOUNTER — Other Ambulatory Visit: Payer: Self-pay | Admitting: Internal Medicine

## 2016-06-18 ENCOUNTER — Other Ambulatory Visit: Payer: Self-pay | Admitting: *Deleted

## 2016-06-18 MED ORDER — DICLOFENAC SODIUM 1 % TD GEL: TRANSDERMAL | 2 refills | Status: DC

## 2016-06-18 MED ORDER — TRIAMCINOLONE ACETONIDE 0.1 % EX CREA: 1.0000 "application " | TOPICAL_CREAM | Freq: Two times a day (BID) | CUTANEOUS | 2 refills | Status: DC

## 2016-07-08 DIAGNOSIS — H401131 Primary open-angle glaucoma, bilateral, mild stage: Secondary | ICD-10-CM | POA: Diagnosis not present

## 2016-08-07 ENCOUNTER — Other Ambulatory Visit: Payer: Self-pay | Admitting: Internal Medicine

## 2016-08-14 ENCOUNTER — Other Ambulatory Visit: Payer: Self-pay | Admitting: Internal Medicine

## 2016-10-21 DIAGNOSIS — H401131 Primary open-angle glaucoma, bilateral, mild stage: Secondary | ICD-10-CM | POA: Diagnosis not present

## 2016-10-21 DIAGNOSIS — Z961 Presence of intraocular lens: Secondary | ICD-10-CM | POA: Diagnosis not present

## 2016-10-28 ENCOUNTER — Other Ambulatory Visit: Payer: Self-pay | Admitting: Internal Medicine

## 2016-11-03 ENCOUNTER — Other Ambulatory Visit: Payer: Self-pay | Admitting: Internal Medicine

## 2016-11-09 ENCOUNTER — Other Ambulatory Visit: Payer: Self-pay | Admitting: Internal Medicine

## 2016-12-05 ENCOUNTER — Other Ambulatory Visit (INDEPENDENT_AMBULATORY_CARE_PROVIDER_SITE_OTHER): Payer: Medicare Other

## 2016-12-05 ENCOUNTER — Encounter: Payer: Self-pay | Admitting: Internal Medicine

## 2016-12-05 ENCOUNTER — Other Ambulatory Visit: Payer: Self-pay | Admitting: Internal Medicine

## 2016-12-05 ENCOUNTER — Telehealth: Payer: Self-pay

## 2016-12-05 ENCOUNTER — Ambulatory Visit (INDEPENDENT_AMBULATORY_CARE_PROVIDER_SITE_OTHER): Payer: Medicare Other | Admitting: Internal Medicine

## 2016-12-05 VITALS — BP 114/72 | HR 76 | Ht 68.0 in | Wt 173.0 lb

## 2016-12-05 DIAGNOSIS — W19XXXA Unspecified fall, initial encounter: Secondary | ICD-10-CM

## 2016-12-05 DIAGNOSIS — E538 Deficiency of other specified B group vitamins: Secondary | ICD-10-CM

## 2016-12-05 DIAGNOSIS — I10 Essential (primary) hypertension: Secondary | ICD-10-CM

## 2016-12-05 DIAGNOSIS — I471 Supraventricular tachycardia, unspecified: Secondary | ICD-10-CM

## 2016-12-05 LAB — BASIC METABOLIC PANEL
BUN: 18 mg/dL (ref 6–23)
CALCIUM: 9.4 mg/dL (ref 8.4–10.5)
CO2: 27 mEq/L (ref 19–32)
CREATININE: 1.03 mg/dL (ref 0.40–1.20)
Chloride: 104 mEq/L (ref 96–112)
GFR: 64.33 mL/min (ref >60.00)
Glucose, Bld: 110 mg/dL — ABNORMAL HIGH (ref 70–99)
Potassium: 3.9 mEq/L (ref 3.5–5.1)
Sodium: 139 mEq/L (ref 135–145)

## 2016-12-05 LAB — CBC WITH DIFFERENTIAL/PLATELET
BASOS ABS: 0.1 10*3/uL (ref 0.0–0.1)
Basophils Relative: 1 % (ref 0.0–3.0)
Eosinophils Absolute: 0.1 10*3/uL (ref 0.0–0.7)
Eosinophils Relative: 2.2 % (ref 0.0–5.0)
HEMATOCRIT: 39.8 % (ref 36.0–46.0)
HEMOGLOBIN: 13 g/dL (ref 12.0–15.0)
LYMPHS PCT: 23.4 % (ref 12.0–46.0)
Lymphs Abs: 1.6 10*3/uL (ref 0.7–4.0)
MCHC: 32.6 g/dL (ref 30.0–36.0)
MCV: 84.3 fl (ref 78.0–100.0)
MONOS PCT: 9.5 % (ref 3.0–12.0)
Monocytes Absolute: 0.7 10*3/uL (ref 0.1–1.0)
NEUTROS ABS: 4.4 10*3/uL (ref 1.4–7.7)
Neutrophils Relative %: 63.9 % (ref 43.0–77.0)
PLATELETS: 439 10*3/uL — AB (ref 150.0–400.0)
RBC: 4.73 Mil/uL (ref 3.87–5.11)
RDW: 15.2 % (ref 11.5–15.5)
WBC: 6.9 10*3/uL (ref 4.0–10.5)

## 2016-12-05 LAB — URINALYSIS, ROUTINE W REFLEX MICROSCOPIC
BILIRUBIN URINE: NEGATIVE
HGB URINE DIPSTICK: NEGATIVE
LEUKOCYTES UA: NEGATIVE
NITRITE: NEGATIVE
RBC / HPF: NONE SEEN (ref 0–?)
Specific Gravity, Urine: 1.02 (ref 1.000–1.030)
URINE GLUCOSE: NEGATIVE
UROBILINOGEN UA: 1 (ref 0.0–1.0)
pH: 6 (ref 5.0–8.0)

## 2016-12-05 LAB — HEPATIC FUNCTION PANEL
ALT: 6 U/L (ref 0–35)
AST: 10 U/L (ref 0–37)
Albumin: 4 g/dL (ref 3.5–5.2)
Alkaline Phosphatase: 100 U/L (ref 39–117)
BILIRUBIN DIRECT: 0.1 mg/dL (ref 0.0–0.3)
BILIRUBIN TOTAL: 0.7 mg/dL (ref 0.2–1.2)
TOTAL PROTEIN: 7.5 g/dL (ref 6.0–8.3)

## 2016-12-05 LAB — TSH: TSH: 1.43 u[IU]/mL (ref 0.35–4.50)

## 2016-12-05 LAB — VITAMIN B12: VITAMIN B 12: 199 pg/mL — AB (ref 211–911)

## 2016-12-05 MED ORDER — CEPHALEXIN 500 MG PO CAPS: 500.0000 mg | ORAL_CAPSULE | Freq: Three times a day (TID) | ORAL | 0 refills | Status: AC

## 2016-12-05 NOTE — Assessment & Plan Note (Signed)
declines ecg today

## 2016-12-05 NOTE — Progress Notes (Signed)
Subjective:    Patient ID: [name], female    DOB: April 30, 1922, 83 y.o.   MRN: [id number]  HPI    Here to f/u with granddaughter after pt with fall on July 2, has hx of falls, walks with walker but seemed to fall backwards all of a sudden for no apparent reason.  Hit buttocks and left post head but no back pain, HA, bruising or swelling that she has had in prior falls.  Pt denies fever, wt loss, night sweats, loss of appetite, or other constitutional symptoms  Denies urinary symptoms such as dysuria, frequency, urgency, flank pain, hematuria or n/v, fever, chills.  Denies worsening reflux, abd pain, dysphagia, n/v, bowel change or blood.  No overt bleeding.  Does not drink as much fluids as she needs per family.  Has some dizziness on occasion and off balance for walker. Past Medical History:  Diagnosis Date  . Arthritis   . Cancer [facility]) 2011, 2012   left breast cancer 2011 central excision,2012 mastectomy  . Complication of anesthesia    shortness of breath after procedure  . Glaucoma   . Hypertension   . Protein-calorie malnutrition, severe ([place])   . SVT (supraventricular tachycardia) ([place] )   . Upper GI bleed 01/2013.    Melena. HPylori negative gastritis on EGD.    Past Surgical History:  Procedure Laterality Date  . ABDOMINAL HYSTERECTOMY    . BREAST SURGERY  2011  . BREAST SURGERY  2012   lt breast masty  . CHOLECYSTECTOMY    . ESOPHAGOGASTRODUODENOSCOPY N/A 01/25/2013   Procedure: ESOPHAGOGASTRODUODENOSCOPY (EGD);  Surgeon: Jerene Bears, MD;  Location: [place];  Service: Gastroenterology;  Laterality: N/A;  . ESOPHAGOGASTRODUODENOSCOPY (EGD) WITH PROPOFOL  08/04/2014   Procedure: ESOPHAGOGASTRODUODENOSCOPY (EGD) WITH PROPOFOL;  Surgeon: Inda Castle, MD;  Location: [place];  Service: Endoscopy;;    reports that she has never smoked. She has never used smokeless tobacco. She reports that she does not drink alcohol or use drugs. family history includes Coronary  artery disease (age of onset: 77) in her mother. Allergies  Allergen Reactions  . Codeine     REACTION: causes nausea and vomiting   Current Outpatient Prescriptions on File Prior to Visit  Medication Sig Dispense Refill  . acetaminophen (TYLENOL) 325 MG tablet Take 650 mg by mouth every 6 (six) hours as needed for pain. Reported on 08/14/2015    . amLODipine (NORVASC) 10 MG tablet TAKE 1 TABLET(10 MG) BY MOUTH DAILY 90 tablet 1  . diclofenac sodium (VOLTAREN) 1 % GEL APPLY 2 GRAMS EXTERNALLY TO THE AFFECTED AREA FOUR TIMES DAILY 100 g 2  . Lidocaine 4 % LOTN Apply as needed to painful locations, not for use on face 88 mL 0  . lisinopril (PRINIVIL,ZESTRIL) 40 MG tablet TAKE 1 TABLET(40 MG) BY MOUTH DAILY 90 tablet 0  . metoprolol tartrate (LOPRESSOR) 25 MG tablet TAKE 1 AND 1/2 TABLETS BY MOUTH TWICE DAILY 270 tablet 0  . pantoprazole (PROTONIX) 40 MG tablet TAKE 1 TABLET BY MOUTH EVERY DAY 90 tablet 0  . travoprost, benzalkonium, (TRAVATAN) 0.004 % ophthalmic solution Place 1 drop into both eyes at bedtime. 2.5 mL 12  . triamcinolone cream (KENALOG) 0.1 % Apply 1 application topically 2 (two) times daily. 30 g 2   No current facility-administered medications on file prior to visit.    Review of Systems  Constitutional: Negative for other unusual diaphoresis or sweats HENT: Negative for ear discharge or swelling Eyes: Negative  for other worsening visual disturbances Respiratory: Negative for stridor or other swelling  Gastrointestinal: Negative for worsening distension or other blood Genitourinary: Negative for retention or other urinary change Musculoskeletal: Negative for other MSK pain or swelling Skin: Negative for color change or other new lesions Neurological: Negative for worsening tremors and other numbness  Psychiatric/Behavioral: Negative for worsening agitation or other fatigue All other system neg per pt    Objective:   Physical Exam BP 114/72   Pulse 76   Ht 5\' 8"   (1.727 m)   Wt 173 lb (78.5 kg)   SpO2 98%   BMI 26.30 kg/m  VS noted, not ill appearing, alert, sitting up in chair, walks slowly with cane today Constitutional: Pt appears in NAD HENT: Head: NCAT.  Right Ear: External ear normal.  Left Ear: External ear normal.  Eyes: . Pupils are equal, round, and reactive to light. Conjunctivae and EOM are normal Nose: without d/c or deformity Neck: Neck supple. Gross normal ROM Cardiovascular: Normal rate and regular rhythm.   Pulmonary/Chest: Effort normal and breath sounds without rales or wheezing.  Abd:  Soft, NT, ND, + BS, no organomegaly Neurological: Pt is alert. At baseline orientation, motor grossly intact MSK: nontender spinel  Has left knee with mod degenerative changes Skin: Skin is warm. No rashes, other new lesions, no LE edema Psychiatric: Pt behavior is normal without agitation  No other exam findings  07/27/2015 - MRI HEAD WITHOUT CONTRAST - summary IMPRESSION: 1.  No acute intracranial abnormality. 2. Moderate for age signal changes in the brain most compatible with chronic small vessel disease.    Assessment & Plan:

## 2016-12-05 NOTE — Patient Instructions (Signed)

## 2016-12-05 NOTE — Telephone Encounter (Signed)
Spoke with pt's daughter [name] and informed her of results and she expressed understanding.

## 2016-12-05 NOTE — Assessment & Plan Note (Signed)
stable overall by history and exam, recent data reviewed with pt, and pt to continue medical treatment as before,  to f/u any worsening symptoms or concerns BP Readings from Last 3 Encounters:  12/05/16 114/72  06/13/16 140/90  04/16/16 (!) 142/76

## 2016-12-05 NOTE — Telephone Encounter (Signed)
-----   Message from Biagio Borg, MD sent at 12/05/2016  5:33 PM EDT ----- Left message on MyChart, pt to cont same tx except  The test results show that your current treatment is OK, except the urine testing may be consistent with an infection, and the Vitamin B12 level is mildly low.  We will send an antibiotic to your pharmacy.  Also start OTC B complex multivitamin - 1 per day.Redmond Baseman to please inform pt or the grandaughter wiith her today, I will do rx

## 2016-12-05 NOTE — Assessment & Plan Note (Addendum)
Etiology unclear but Without apparent injury, for lab evaluation today, declines cxr,  to f/u any worsening symptoms or concerns

## 2016-12-07 ENCOUNTER — Other Ambulatory Visit: Payer: Self-pay | Admitting: Internal Medicine

## 2016-12-30 ENCOUNTER — Other Ambulatory Visit: Payer: Self-pay | Admitting: Internal Medicine

## 2017-01-22 ENCOUNTER — Other Ambulatory Visit: Payer: Self-pay | Admitting: Internal Medicine

## 2017-01-31 ENCOUNTER — Other Ambulatory Visit: Payer: Self-pay | Admitting: Internal Medicine

## 2017-02-06 ENCOUNTER — Other Ambulatory Visit: Payer: Self-pay | Admitting: Family

## 2017-02-06 ENCOUNTER — Telehealth: Payer: Self-pay

## 2017-02-06 NOTE — Telephone Encounter (Signed)
LVM for patient to call back. I wanted to inform them that the disability parking placard form is filled out and signed, I just wanted to know if they want to pick it up or for it to be mailed.

## 2017-02-24 DIAGNOSIS — H401131 Primary open-angle glaucoma, bilateral, mild stage: Secondary | ICD-10-CM | POA: Diagnosis not present

## 2017-03-06 ENCOUNTER — Other Ambulatory Visit: Payer: Self-pay | Admitting: Family

## 2017-03-06 ENCOUNTER — Encounter (HOSPITAL_COMMUNITY): Payer: Self-pay

## 2017-03-06 ENCOUNTER — Emergency Department (HOSPITAL_COMMUNITY)
Admission: EM | Admit: 2017-03-06 | Discharge: 2017-03-06 | Disposition: A | Discharge status: Home / Self Care | Payer: Medicare Other | Attending: Emergency Medicine | Admitting: Emergency Medicine

## 2017-03-06 ENCOUNTER — Emergency Department (HOSPITAL_COMMUNITY): Payer: Medicare Other

## 2017-03-06 DIAGNOSIS — R42 Dizziness and giddiness: Secondary | ICD-10-CM | POA: Diagnosis not present

## 2017-03-06 DIAGNOSIS — I471 Supraventricular tachycardia: Secondary | ICD-10-CM | POA: Diagnosis not present

## 2017-03-06 DIAGNOSIS — Z853 Personal history of malignant neoplasm of breast: Secondary | ICD-10-CM | POA: Insufficient documentation

## 2017-03-06 DIAGNOSIS — I10 Essential (primary) hypertension: Secondary | ICD-10-CM | POA: Insufficient documentation

## 2017-03-06 DIAGNOSIS — Z79899 Other long term (current) drug therapy: Secondary | ICD-10-CM | POA: Insufficient documentation

## 2017-03-06 LAB — CBC
HCT: 41.5 % (ref 36.0–46.0)
Hemoglobin: 13 g/dL (ref 12.0–15.0)
MCH: 26 pg (ref 26.0–34.0)
MCHC: 31.3 g/dL (ref 30.0–36.0)
MCV: 83 fL (ref 78.0–100.0)
PLATELETS: 416 10*3/uL — AB (ref 150–400)
RBC: 5 MIL/uL (ref 3.87–5.11)
RDW: 15.1 % (ref 11.5–15.5)
WBC: 5.6 10*3/uL (ref 4.0–10.5)

## 2017-03-06 LAB — BASIC METABOLIC PANEL
Anion gap: 10 (ref 5–15)
BUN: 11 mg/dL (ref 6–20)
CHLORIDE: 105 mmol/L (ref 101–111)
CO2: 24 mmol/L (ref 22–32)
Calcium: 9.3 mg/dL (ref 8.9–10.3)
Creatinine, Ser: 1.09 mg/dL — ABNORMAL HIGH (ref 0.44–1.00)
GFR calc non Af Amer: 42 mL/min — ABNORMAL LOW (ref >60)
GFR, EST AFRICAN AMERICAN: 49 mL/min — AB (ref >60)
Glucose, Bld: 102 mg/dL — ABNORMAL HIGH (ref 65–99)
POTASSIUM: 3.7 mmol/L (ref 3.5–5.1)
SODIUM: 139 mmol/L (ref 135–145)

## 2017-03-06 LAB — I-STAT TROPONIN, ED: Troponin i, poc: 0.02 ng/mL (ref 0.00–0.08)

## 2017-03-06 MED ORDER — DILTIAZEM HCL 60 MG PO TABS
60.0000 mg | ORAL_TABLET | Freq: Once | ORAL | Status: AC
  Administered 2017-03-06: 60 mg via ORAL
  Filled 2017-03-06: qty 1

## 2017-03-06 MED ORDER — SODIUM CHLORIDE 0.9 % IV BOLUS (SEPSIS)
500.0000 mL | Freq: Once | INTRAVENOUS | Status: AC
  Administered 2017-03-06: 500 mL via INTRAVENOUS

## 2017-03-06 NOTE — ED Triage Notes (Signed)
Per Pt, Pt is coming from home with complaints of dizziness x 1 week. Denies blurred vision, double vision, chest pain, SOB, Unilateral weakness, N/V/D. Pt's only complaint is dizziness intermittently.

## 2017-03-06 NOTE — ED Notes (Signed)
Wheeled patient to the bathroom patient is now back in bed with call bell in reach family at bedside

## 2017-03-06 NOTE — Discharge Instructions (Signed)
It was our pleasure to provide your ER care today - we hope that you feel better.  Rest. Drink plenty of fluids.  Continue metoprolol.   Follow up with cardiologist in the coming week - see referral - call office to arrange appointment.  Return to ER if worse, persistent fast heart beat, weak/fainting, fevers, trouble breathing, other concern.

## 2017-03-06 NOTE — ED Notes (Signed)
Pt taken to Xray.

## 2017-03-06 NOTE — ED Notes (Signed)
Pt states she doesn't feel as dizzy.

## 2017-03-06 NOTE — ED Provider Notes (Signed)
[facility] DEPT Provider Note   CSN: [id number] Arrival date & time: 03/06/17  1028     History   Chief Complaint Chief Complaint  Patient presents with  . Dizziness    HPI [name] is a 83 y.o. female.  Patient c/o lightheadedness, dizziness, in past day. No room spinning. Symptoms mild, persistent, occ worse w standing. No syncope. Denies headache. No change in speech or vision. No numbness/weakness or loss of normal functional ability. Denies palpitations. No chest pain or sob. No abd pain. Normal appetite. No nvd. No dysuria or gu c/o. No fever or chills. Compliant w normal meds, denies recent change.     Dizziness  Associated symptoms: no blood in stool, no chest pain, no diarrhea, no headaches, no palpitations, no shortness of breath and no vomiting     Past Medical History:  Diagnosis Date  . Arthritis   . Cancer [facility]) 2011, 2012   left breast cancer 2011 central excision,2012 mastectomy  . Complication of anesthesia    shortness of breath after procedure  . Glaucoma   . Hypertension   . Protein-calorie malnutrition, severe ([place])   . SVT (supraventricular tachycardia) ([place])   . Upper GI bleed 01/2013.    Melena. HPylori negative gastritis on EGD.     Patient Active Problem List   Diagnosis Date Noted  . Fall 12/05/2016  . Debilitated 08/10/2014  . Physical deconditioning   . Acute blood loss anemia 08/04/2014  . Protein-calorie malnutrition, severe ([place]) 02/24/2014  . SVT (supraventricular tachycardia) ([place]) 02/23/2014  . DJD (degenerative joint disease) 04/07/2013  . Breast cancer- s/p mastectomy 03/11/2013  . GERD 03/10/2008  . GLAUCOMA 09/30/2007  . Essential hypertension 09/30/2007    Past Surgical History:  Procedure Laterality Date  . ABDOMINAL HYSTERECTOMY    . BREAST SURGERY  2011  . BREAST SURGERY  2012   lt breast masty  . CHOLECYSTECTOMY    . ESOPHAGOGASTRODUODENOSCOPY N/A 01/25/2013   Procedure: ESOPHAGOGASTRODUODENOSCOPY  (EGD);  Surgeon: Jerene Bears, MD;  Location: [place];  Service: Gastroenterology;  Laterality: N/A;  . ESOPHAGOGASTRODUODENOSCOPY (EGD) WITH PROPOFOL  08/04/2014   Procedure: ESOPHAGOGASTRODUODENOSCOPY (EGD) WITH PROPOFOL;  Surgeon: Inda Castle, MD;  Location: [place];  Service: Endoscopy;;    OB History    No data available       Home Medications    Prior to Admission medications   Medication Sig Start Date End Date Taking? Authorizing Provider  acetaminophen (TYLENOL) 325 MG tablet Take 650 mg by mouth every 6 (six) hours as needed for pain. Reported on 08/14/2015    [provider]  amLODipine (NORVASC) 10 MG tablet TAKE 1 TABLET(10 MG) BY MOUTH DAILY 12/09/16   Golden Circle, FNP  diclofenac sodium (VOLTAREN) 1 % GEL APPLY 2 GRAMS EXTERNALLY TO THE AFFECTED AREA FOUR TIMES DAILY 12/30/16   Golden Circle, FNP  Lidocaine 4 % LOTN Apply as needed to painful locations, not for use on face 06/14/16   Hoyt Koch, MD  lisinopril (PRINIVIL,ZESTRIL) 40 MG tablet TAKE 1 TABLET(40 MG) BY MOUTH DAILY 02/06/17   Hoyt Koch, MD  metoprolol tartrate (LOPRESSOR) 25 MG tablet TAKE 1 AND 1/2 TABLETS BY MOUTH TWICE DAILY 01/23/17   Hoyt Koch, MD  pantoprazole (PROTONIX) 40 MG tablet Take 1 tablet (40 mg total) by mouth daily. Keep 10/5 appointment for further refills 01/31/17   Hoyt Koch, MD  travoprost, benzalkonium, (TRAVATAN) 0.004 % ophthalmic solution Place 1 drop  into both eyes at bedtime. 08/19/14   Angiulli, Lavon Paganini, PA-C  triamcinolone cream (KENALOG) 0.1 % Apply 1 application topically 2 (two) times daily. 06/18/16   Hoyt Koch, MD    Family History Family History  Problem Relation Age of Onset  . Coronary artery disease Mother 64       MI    Social History Social History  Substance Use Topics  . Smoking status: Never Smoker  . Smokeless tobacco: Never Used  . Alcohol use No     Allergies    Codeine   Review of Systems Review of Systems  Constitutional: Negative for chills and fever.  HENT: Negative for sore throat.   Eyes: Negative for visual disturbance.  Respiratory: Negative for shortness of breath.   Cardiovascular: Negative for chest pain and palpitations.  Gastrointestinal: Negative for abdominal pain, blood in stool, diarrhea and vomiting.  Genitourinary: Negative for dysuria and flank pain.  Musculoskeletal: Negative for back pain and neck pain.  Skin: Negative for rash.  Neurological: Positive for dizziness and light-headedness. Negative for speech difficulty and headaches.  Hematological: Does not bruise/bleed easily.  Psychiatric/Behavioral: Negative for confusion.     Physical Exam Updated Vital Signs BP 123/86 (BP Location: Right Arm)   Pulse 95   Temp 98 F (36.7 C) (Oral)   Resp 16   Ht 1.702 m (5\' 7" )   SpO2 98%   Physical Exam  Constitutional: She appears well-developed and well-nourished. No distress.  HENT:  Head: Atraumatic.  Eyes: Pupils are equal, round, and reactive to light. Conjunctivae are normal. No scleral icterus.  Neck: Neck supple. No tracheal deviation present.  No bruits.   Cardiovascular: Normal heart sounds and intact distal pulses.  Exam reveals no gallop and no friction rub.   No murmur heard. Tachycardic, irregular.   Pulmonary/Chest: Effort normal and breath sounds normal. No respiratory distress.  Abdominal: Soft. Normal appearance and bowel sounds are normal. She exhibits no distension. There is no tenderness.  Musculoskeletal: She exhibits no edema.  Neurological: She is alert.  Speech clear/fluent. Motor intact bil. Steady gait.   Skin: Skin is warm and dry. No rash noted. She is not diaphoretic.  Psychiatric: She has a normal mood and affect.  Nursing note and vitals reviewed.    ED Treatments / Results  Labs (all labs ordered are listed, but only abnormal results are displayed) Results for orders placed  or performed during the hospital encounter of 03/47/42  Basic metabolic panel  Result Value Ref Range   Sodium 139 135 - 145 mmol/L   Potassium 3.7 3.5 - 5.1 mmol/L   Chloride 105 101 - 111 mmol/L   CO2 24 22 - 32 mmol/L   Glucose, Bld 102 (H) 65 - 99 mg/dL   BUN 11 6 - 20 mg/dL   Creatinine, Ser 1.09 (H) 0.44 - 1.00 mg/dL   Calcium 9.3 8.9 - 10.3 mg/dL   GFR calc non Af Amer 42 (L) >60 mL/min   GFR calc Af Amer 49 (L) >60 mL/min   Anion gap 10 5 - 15  CBC  Result Value Ref Range   WBC 5.6 4.0 - 10.5 K/uL   RBC 5.00 3.87 - 5.11 MIL/uL   Hemoglobin 13.0 12.0 - 15.0 g/dL   HCT 41.5 36.0 - 46.0 %   MCV 83.0 78.0 - 100.0 fL   MCH 26.0 26.0 - 34.0 pg   MCHC 31.3 30.0 - 36.0 g/dL   RDW 15.1 11.5 - 15.5 %  Platelets 416 (H) 150 - 400 K/uL  I-stat troponin, ED  Result Value Ref Range   Troponin i, poc 0.02 0.00 - 0.08 ng/mL   Comment 3           Dg Chest 2 View  Result Date: 03/06/2017 CLINICAL DATA:  83 year old female with tachycardia, dizziness and history of breast cancer EXAM: CHEST  2 VIEW COMPARISON:  Prior chest x-ray 08/03/2014 FINDINGS: Cardiac and mediastinal contours remain unchanged. Prominent transverse segment of the aorta. Scattered aortic atherosclerotic calcifications. No focal airspace consolidation, pulmonary edema, pleural effusion or pneumothorax. Mild hyperinflation and chronic bronchitic changes. IMPRESSION: No active cardiopulmonary disease. Aortic Atherosclerosis (ICD10-170.0) Electronically Signed   By: Jacqulynn Cadet M.D.   On: 03/06/2017 11:14    EKG  EKG Interpretation  Date/Time:  Thursday March 06 2017 10:43:48 EDT Ventricular Rate:  144 PR Interval:  170 QRS Duration: 92 QT Interval:  312 QTC Calculation: 483 R Axis:   -77 Text Interpretation:  Atrial fibrillation with with rapid ventricular response Left anterior fascicular block Confirmed by Lajean Saver [id number]) on 03/06/2017 11:41:14 AM       Radiology Dg Chest 2 View  Result  Date: 03/06/2017 CLINICAL DATA:  83 year old female with tachycardia, dizziness and history of breast cancer EXAM: CHEST  2 VIEW COMPARISON:  Prior chest x-ray 08/03/2014 FINDINGS: Cardiac and mediastinal contours remain unchanged. Prominent transverse segment of the aorta. Scattered aortic atherosclerotic calcifications. No focal airspace consolidation, pulmonary edema, pleural effusion or pneumothorax. Mild hyperinflation and chronic bronchitic changes. IMPRESSION: No active cardiopulmonary disease. Aortic Atherosclerosis (ICD10-170.0) Electronically Signed   By: Jacqulynn Cadet M.D.   On: 03/06/2017 11:14    Procedures Procedures (including critical care time)  Medications Ordered in ED Medications  diltiazem (CARDIZEM) tablet 60 mg (not administered)     Initial Impression / Assessment and Plan / ED Course  I have reviewed the triage vital signs and the nursing notes.  Pertinent labs & imaging results that were available during my care of the patient were reviewed by me and considered in my medical decision making (see chart for details).  Iv ns. Continuous pulse ox and monitor.   Labs.  Reviewed nursing notes and prior charts for additional history.   Episodes of afib.  No current anticoag. cardizem given.   Iv ns bolus. Po fluids.  Recheck - remains in nsr on monitor. No current symptoms. No faintness or dizziness. No chest pain or sob.  On review prior charts, history eval for svt/?afib 2015 - will refer to cardiology f/u.    Final Clinical Impressions(s) / ED Diagnoses   Final diagnoses:  None    New Prescriptions New Prescriptions   No medications on file     Lajean Saver, MD 03/06/17 1355

## 2017-03-07 ENCOUNTER — Ambulatory Visit: Payer: Medicare Other | Admitting: Internal Medicine

## 2017-03-10 ENCOUNTER — Other Ambulatory Visit: Payer: Self-pay | Admitting: Family

## 2017-03-19 ENCOUNTER — Ambulatory Visit (INDEPENDENT_AMBULATORY_CARE_PROVIDER_SITE_OTHER): Payer: Medicare Other | Admitting: Physician Assistant

## 2017-03-19 ENCOUNTER — Encounter: Payer: Self-pay | Admitting: Physician Assistant

## 2017-03-19 VITALS — BP 161/78 | HR 63 | Ht 67.0 in | Wt 171.6 lb

## 2017-03-19 DIAGNOSIS — I351 Nonrheumatic aortic (valve) insufficiency: Secondary | ICD-10-CM | POA: Diagnosis not present

## 2017-03-19 DIAGNOSIS — I10 Essential (primary) hypertension: Secondary | ICD-10-CM | POA: Diagnosis not present

## 2017-03-19 DIAGNOSIS — I471 Supraventricular tachycardia: Secondary | ICD-10-CM | POA: Diagnosis not present

## 2017-03-19 MED ORDER — METOPROLOL TARTRATE 50 MG PO TABS: 50.0000 mg | ORAL_TABLET | Freq: Two times a day (BID) | ORAL | 3 refills | Status: DC

## 2017-03-19 MED ORDER — ASPIRIN EC 81 MG PO TBEC: 81.0000 mg | DELAYED_RELEASE_TABLET | Freq: Every day | ORAL | Status: DC

## 2017-03-19 NOTE — Progress Notes (Signed)
Cardiology Office Note    Date:  03/21/2017   ID:  [name], DOB [birth year], MRN [id number]  PCP:  Barbara Koch, MD  Cardiologist:  Dr. Debara Pickett   Chief Complaint  Patient presents with  . Follow-up    seen for Dr. Debara Pickett    History of Present Illness:  [name] is a 83 y.o. [name] with PMH of L breast CA, GI bleed 2014, AI, HTN and SVT. She also had breast cancer status post a left lumpectomy in 2011, this was followed by left mastectomy in 2012. She has a previous history of GI bleed in 2014 secondary to NSAIDs. She was first seen by Dr. Debara Pickett on 02/23/2014 for SVT which responded to adenosine however would recur. She was placed on beta blocker for rate control. She was felt to be volume depleted after starting on chlorthalidone at the same time of Lasix. Her Lasix was discontinued. She had a echocardiogram obtained on 08/04/2014 that showed EF 65-70%, no regional wall motion abnormality, grade 1 diastolic dysfunction, bicuspid aortic valve morphology of aortic valve cannot be excluded, mild-to-moderate AR was centrally directed into LVOT. She recently presented to the [name] with dizziness and found to have tachycardia on EKG suggesting either atrial fibrillation versus SVT.  She presents today for cardiology office visit. I have reviewed the recent EKG, it is not clear to me whether it represented atrial fibrillation with RVR or SVT. However the irregularity of it suggest possible atrial fibrillation with RVR. I have discussed with both the patient and her daughter, they are quite agreeable that she would not be a candidate for any systemic anticoagulation given her advanced age and propensity for fall. She has fallen several times in the past year, most of which are related to imbalance issue. She walks around with a walker. According to the patient, she did not have any cardiac awareness of tachycardia, she only had dizziness. Otherwise she denies any chest pain or  shortness of breath.    Past Medical History:  Diagnosis Date  . Arthritis   . Cancer [name]) 2011, 2012   left breast cancer 2011 central excision,2012 mastectomy  . Complication of anesthesia    shortness of breath after procedure  . Glaucoma   . Hypertension   . Protein-calorie malnutrition, severe ([place])   . SVT (supraventricular tachycardia) ([place])   . Upper GI bleed 01/2013.    Melena. HPylori negative gastritis on EGD.     Past Surgical History:  Procedure Laterality Date  . ABDOMINAL HYSTERECTOMY    . BREAST SURGERY  2011  . BREAST SURGERY  2012   lt breast masty  . CHOLECYSTECTOMY    . ESOPHAGOGASTRODUODENOSCOPY N/A 01/25/2013   Procedure: ESOPHAGOGASTRODUODENOSCOPY (EGD);  Surgeon: Jerene Bears, MD;  Location: [place];  Service: Gastroenterology;  Laterality: N/A;  . ESOPHAGOGASTRODUODENOSCOPY (EGD) WITH PROPOFOL  08/04/2014   Procedure: ESOPHAGOGASTRODUODENOSCOPY (EGD) WITH PROPOFOL;  Surgeon: Inda Castle, MD;  Location: [facility] ENDOSCOPY;  Service: Endoscopy;;    Current Medications: Outpatient Medications Prior to Visit  Medication Sig Dispense Refill  . amLODipine (NORVASC) 10 MG tablet TAKE 1 TABLET(10 MG) BY MOUTH DAILY 90 tablet 0  . diclofenac sodium (VOLTAREN) 1 % GEL APPLY 2 GRAMS EXTERNALLY TO THE AFFECTED AREA FOUR TIMES DAILY 100 g 0  . lisinopril (PRINIVIL,ZESTRIL) 40 MG tablet TAKE 1 TABLET(40 MG) BY MOUTH DAILY 90 tablet 0  . pantoprazole (PROTONIX) 40 MG tablet Take 1 tablet (40 mg total) by mouth  daily. Keep 10/5 appointment for further refills 90 tablet 0  . travoprost, benzalkonium, (TRAVATAN) 0.004 % ophthalmic solution Place 1 drop into both eyes at bedtime. 2.5 mL 12  . triamcinolone cream (KENALOG) 0.1 % Apply 1 application topically 2 (two) times daily. 30 g 2  . metoprolol tartrate (LOPRESSOR) 25 MG tablet TAKE 1 AND 1/2 TABLETS BY MOUTH TWICE DAILY (Patient taking differently: TAKE 37.5MG  BY MOUTH TWICE DAILY) 270 tablet 0  . Lidocaine 4 %  LOTN Apply as needed to painful locations, not for use on face (Patient not taking: Reported on 03/06/2017) 88 mL 0   No facility-administered medications prior to visit.      Allergies:   Codeine   Social History   Social History  . Marital status: Widowed    Spouse name: N/A  . Number of children: 7  . Years of education: 8   Occupational History  .  Retired   Social History Main Topics  . Smoking status: Never Smoker  . Smokeless tobacco: Never Used  . Alcohol use No  . Drug use: No  . Sexual activity: No   Other Topics Concern  . None   Social History Narrative   8th grade. Married '40 - 52 yrs/widowed. 5 sons, 2 daughters; 6 grandchildren; 12 great grands.   Lives alone and manages ADLs except driving. Family is very supportive. ACP -discussed with her and referred to "[employer]. Org." (Oct '13)     Family History:  The patient's family history includes Coronary artery disease (age of onset: 9) in her mother.   ROS:   Please see the history of present illness.    ROS All other systems reviewed and are negative.   PHYSICAL EXAM:   VS:  BP (!) 161/78 (BP Location: Right Arm)   Pulse 63   Ht 5\' 7"  (1.702 m)   Wt 171 lb 9.6 oz (77.8 kg)   BMI 26.88 kg/m    GEN: Well nourished, well developed, in no acute distress  HEENT: normal  Neck: no JVD, carotid bruits, or masses Cardiac: RRR; no murmurs, rubs, or gallops,no edema  Respiratory:  clear to auscultation bilaterally, normal work of breathing GI: soft, nontender, nondistended, + BS MS: no deformity or atrophy  Skin: warm and dry, no rash Neuro:  Alert and Oriented x 3, Strength and sensation are intact Psych: euthymic mood, full affect  Wt Readings from Last 3 Encounters:  03/19/17 171 lb 9.6 oz (77.8 kg)  12/05/16 173 lb (78.5 kg)  06/13/16 174 lb (78.9 kg)      Studies/Labs Reviewed:   EKG:  EKG is ordered today.  The ekg ordered today demonstrates Sinus rhythm, heart rate 63, first  degree AV block.  Recent Labs: 12/05/2016: ALT 6; TSH 1.43 03/06/2017: BUN 11; Creatinine, Ser 1.09; Hemoglobin 13.0; Platelets 416; Potassium 3.7; Sodium 139   Lipid Panel    Component Value Date/Time   CHOL 216 (H) 02/09/2014 1107   TRIG 107.0 02/09/2014 1107   HDL 57.60 02/09/2014 1107   CHOLHDL 4 02/09/2014 1107   VLDL 21.4 02/09/2014 1107   LDLCALC 137 (H) 02/09/2014 1107    Additional studies/ records that were reviewed today include:   Echo 08/04/2014 LV EF: 65% -  70%  ------------------------------------------------------------------- History:  PMH: SVT. Risk factors: History of breast cancer. Hypertension.  ------------------------------------------------------------------- Study Conclusions  - Left ventricle: The cavity size was normal. There was mild concentric hypertrophy. Systolic function was vigorous. The estimated ejection fraction  was in the range of 65% to 70%. Wall motion was normal; there were no regional wall motion abnormalities. Doppler parameters are consistent with abnormal left ventricular relaxation (grade 1 diastolic dysfunction). - Aortic valve: A bicuspid morphology cannot be excluded. There was mild to moderate regurgitation directed centrally in the LVOT. - Atrial septum: No defect or patent foramen ovale was identified. - Tricuspid valve: Mild, late systolicprolapse.   ASSESSMENT:    1. SVT (supraventricular tachycardia) ([place])   2. Essential hypertension   3. Aortic valve insufficiency, etiology of cardiac valve disease unspecified      PLAN:  In order of problems listed above:  1. SVT: Although she has history of SVT, however recent EKG highly suggestive of possible atrial fibrillation with RVR. I have discussed with both the patient and her daughter, they are quite agreeable that she would not be good candidate for systemic anticoagulation even if she does have atrial fibrillation. Start on aspirin. She is quite aware  that on aspirin, she would still have higher chance of stroke with afib.   2. Hypertension: Blood pressure elevated today,  increase metoprolol to 50 mg twice a day.   3. Mild to moderate aortic regurgitation: Continue monitoring    Medication Adjustments/Labs and Tests Ordered: Current medicines are reviewed at length with the patient today.  Concerns regarding medicines are outlined above.  Medication changes, Labs and Tests ordered today are listed in the Patient Instructions below. Patient Instructions  Barbara Deforest, PA has recommended making the following medication changes: 1. START Aspirin 81 mg daily 2. INCREASE Metoprolol to 50 mg twice daily  Your physician has requested that you regularly monitor and record your blood pressure readings at home. Please use the same machine to check your readings and record them. Call the office, to speak with a nurse, if the systolic (top number) is consistently less than 120 mmHg or greater than 150 mmHg, OR if your heart rate is less than 50 bpm.  Your physician recommends that you schedule a follow-up appointment in 1 month with [name].  [name] recommends that you schedule a follow-up appointment in 3-4 months with Dr Debara Pickett.  If you need a refill on your cardiac medications before your next appointment, please call your pharmacy.    [name], [name]  03/21/2017 11:22 AM    [address], [place], [place]  [address] Phone: [phone]; Fax: [phone]

## 2017-03-19 NOTE — Patient Instructions (Signed)
[name], [name] has recommended making the following medication changes: 1. START Aspirin 81 mg daily 2. INCREASE Metoprolol to 50 mg twice daily  Your physician has requested that you regularly monitor and record your blood pressure readings at home. Please use the same machine to check your readings and record them. Call the office, to speak with a nurse, if the systolic (top number) is consistently less than 120 mmHg or greater than 150 mmHg, OR if your heart rate is less than 50 bpm.  Your physician recommends that you schedule a follow-up appointment in 1 month with [facility].  [name] recommends that you schedule a follow-up appointment in 3-4 months with Dr Debara Pickett.  If you need a refill on your cardiac medications before your next appointment, please call your pharmacy.

## 2017-03-21 ENCOUNTER — Encounter: Payer: Self-pay | Admitting: Physician Assistant

## 2017-04-20 ENCOUNTER — Other Ambulatory Visit: Payer: Self-pay | Admitting: Internal Medicine

## 2017-04-22 ENCOUNTER — Encounter: Payer: Self-pay | Admitting: Physician Assistant

## 2017-04-22 ENCOUNTER — Other Ambulatory Visit: Payer: Self-pay | Admitting: Physician Assistant

## 2017-04-22 ENCOUNTER — Ambulatory Visit (INDEPENDENT_AMBULATORY_CARE_PROVIDER_SITE_OTHER): Payer: Medicare Other | Admitting: Physician Assistant

## 2017-04-22 VITALS — BP 142/68 | HR 71 | Ht 67.0 in | Wt 171.0 lb

## 2017-04-22 DIAGNOSIS — I10 Essential (primary) hypertension: Secondary | ICD-10-CM

## 2017-04-22 DIAGNOSIS — I351 Nonrheumatic aortic (valve) insufficiency: Secondary | ICD-10-CM

## 2017-04-22 DIAGNOSIS — I471 Supraventricular tachycardia: Secondary | ICD-10-CM | POA: Diagnosis not present

## 2017-04-22 MED ORDER — HYDRALAZINE HCL 25 MG PO TABS: 25.0000 mg | ORAL_TABLET | Freq: Three times a day (TID) | ORAL | 3 refills | Status: DC

## 2017-04-22 NOTE — Progress Notes (Signed)
Cardiology Office Note    Date:  04/24/2017   ID:  [name], DOB Sep 24, 1921, MRN [id number]  PCP:  Hoyt Koch, MD  Cardiologist:  Dr. Debara Pickett  Chief Complaint  Patient presents with  . Follow-up    1 Month, pt denied chest pain     History of Present Illness:  [name] is a 83 y.o. female  with PMH of L breast CA, GI bleed 2014, AI, HTN and SVT. She also had breast cancer status post a left lumpectomy in 2011, this was followed by left mastectomy in 2012. She has a previous history of GI bleed in 2014 secondary to NSAIDs. She was first seen by Dr. Debara Pickett on 02/23/2014 for SVT which responded to adenosine however would recur. She was placed on beta blocker for rate control. She was felt to be volume depleted after starting on chlorthalidone at the same time of Lasix. Her Lasix was discontinued. She had a echocardiogram obtained on 08/04/2014 that showed EF 65-70%, no regional wall motion abnormality, grade 1 diastolic dysfunction, bicuspid aortic valve morphology of aortic valve cannot be excluded, mild-to-moderate AR with centrally directed into LVOT. She recently presented to the hospital with dizziness and found to have tachycardia on EKG suggesting either atrial fibrillation versus SVT.  I last saw the patient on 03/19/2017, reviewed her EKG at the time, it was unclear to me whether she had atrial fibrillation or SVT. However the irregularity of the rhythms suggestive possible atrial fibrillation. I did discuss with both the patient and her daughter, they are quite agreeable that she is not a candidate for any systemic anticoagulation given her advanced age and the propensity for fall. Therefore she was kept on a daily dose of aspirin. I increased metoprolol to 50 mg twice a day. She returned today for cardiology office visit accompanied by her son. She denies any significant chest pain or shortness breath. She appears to be euvolemic on physical exam. Her blood pressure  is mildly elevated, I will add hydralazine 25 mg twice a day.    Past Medical History:  Diagnosis Date  . Arthritis   . Cancer [facility]) 2011, 2012   left breast cancer 2011 central excision,2012 mastectomy  . Complication of anesthesia    shortness of breath after procedure  . Glaucoma   . Hypertension   . Protein-calorie malnutrition, severe ([place])   . SVT (supraventricular tachycardia) ([place])   . Upper GI bleed 01/2013.    Melena. HPylori negative gastritis on EGD.     Past Surgical History:  Procedure Laterality Date  . ABDOMINAL HYSTERECTOMY    . BREAST SURGERY  2011  . BREAST SURGERY  2012   lt breast masty  . CHOLECYSTECTOMY    . ESOPHAGOGASTRODUODENOSCOPY N/A 01/25/2013   Procedure: ESOPHAGOGASTRODUODENOSCOPY (EGD);  Surgeon: Jerene Bears, MD;  Location: [facility];  Service: Gastroenterology;  Laterality: N/A;  . ESOPHAGOGASTRODUODENOSCOPY (EGD) WITH PROPOFOL  08/04/2014   Procedure: ESOPHAGOGASTRODUODENOSCOPY (EGD) WITH PROPOFOL;  Surgeon: Inda Castle, MD;  Location: [facility] ENDOSCOPY;  Service: Endoscopy;;    Current Medications: Outpatient Medications Prior to Visit  Medication Sig Dispense Refill  . amLODipine (NORVASC) 10 MG tablet TAKE 1 TABLET(10 MG) BY MOUTH DAILY 90 tablet 0  . aspirin EC 81 MG tablet Take 1 tablet (81 mg total) by mouth daily.    . diclofenac sodium (VOLTAREN) 1 % GEL APPLY 2 GRAMS EXTERNALLY TO THE AFFECTED AREA FOUR TIMES DAILY 100 g 0  . lisinopril (  PRINIVIL,ZESTRIL) 40 MG tablet TAKE 1 TABLET(40 MG) BY MOUTH DAILY 90 tablet 0  . metoprolol tartrate (LOPRESSOR) 50 MG tablet Take 1 tablet (50 mg total) by mouth 2 (two) times daily. 180 tablet 3  . pantoprazole (PROTONIX) 40 MG tablet Take 1 tablet (40 mg total) by mouth daily. Keep 10/5 appointment for further refills 90 tablet 0  . travoprost, benzalkonium, (TRAVATAN) 0.004 % ophthalmic solution Place 1 drop into both eyes at bedtime. 2.5 mL 12  . triamcinolone cream (KENALOG) 0.1 % Apply 1  application topically 2 (two) times daily. 30 g 2   No facility-administered medications prior to visit.      Allergies:   Codeine   Social History   Socioeconomic History  . Marital status: Widowed    Spouse name: None  . Number of children: 7  . Years of education: 8  . Highest education level: None  Social Needs  . Financial resource strain: None  . Food insecurity - worry: None  . Food insecurity - inability: None  . Transportation needs - medical: None  . Transportation needs - non-medical: None  Occupational History    Employer: RETIRED  Tobacco Use  . Smoking status: Never Smoker  . Smokeless tobacco: Never Used  Substance and Sexual Activity  . Alcohol use: No  . Drug use: No  . Sexual activity: No  Other Topics Concern  . None  Social History Narrative   8th grade. Married '40 - 52 yrs/widowed. 5 sons, 2 daughters; 6 grandchildren; 12 great grands.   Lives alone and manages ADLs except driving. Family is very supportive. ACP -discussed with her and referred to "[employer]. Org." (Oct '13)     Family History:  The patient's family history includes Coronary artery disease (age of onset: 63) in her mother.   ROS:   Please see the history of present illness.    ROS All other systems reviewed and are negative.   PHYSICAL EXAM:   VS:  BP (!) 142/68   Pulse 71   Ht 5\' 7"  (1.702 m)   Wt 171 lb (77.6 kg)   SpO2 95%   BMI 26.78 kg/m    GEN: Well nourished, well developed, in no acute distress  HEENT: normal  Neck: no JVD, carotid bruits, or masses Cardiac: RRR; no murmurs, rubs, or gallops,no edema  Respiratory:  clear to auscultation bilaterally, normal work of breathing GI: soft, nontender, nondistended, + BS MS: no deformity or atrophy  Skin: warm and dry, no rash Neuro:  Alert and Oriented x 3, Strength and sensation are intact Psych: euthymic mood, full affect  Wt Readings from Last 3 Encounters:  04/22/17 171 lb (77.6 kg)  03/19/17  171 lb 9.6 oz (77.8 kg)  12/05/16 173 lb (78.5 kg)      Studies/Labs Reviewed:   EKG:  EKG is not ordered today.  \  Recent Labs: 12/05/2016: ALT 6; TSH 1.43 03/06/2017: BUN 11; Creatinine, Ser 1.09; Hemoglobin 13.0; Platelets 416; Potassium 3.7; Sodium 139   Lipid Panel    Component Value Date/Time   CHOL 216 (H) 02/09/2014 1107   TRIG 107.0 02/09/2014 1107   HDL 57.60 02/09/2014 1107   CHOLHDL 4 02/09/2014 1107   VLDL 21.4 02/09/2014 1107   LDLCALC 137 (H) 02/09/2014 1107    Additional studies/ records that were reviewed today include:    Echo 08/04/2014 LV EF: 65% -  70%  Study Conclusions  - Left ventricle: The cavity size was normal.  There was mild concentric hypertrophy. Systolic function was vigorous. The estimated ejection fraction was in the range of 65% to 70%. Wall motion was normal; there were no regional wall motion abnormalities. Doppler parameters are consistent with abnormal left ventricular relaxation (grade 1 diastolic dysfunction). - Aortic valve: A bicuspid morphology cannot be excluded. There was mild to moderate regurgitation directed centrally in the LVOT. - Atrial septum: No defect or patent foramen ovale was identified. - Tricuspid valve: Mild, late systolicprolapse.   ASSESSMENT:    1. SVT (supraventricular tachycardia) ([place])   2. Aortic valve insufficiency, etiology of cardiac valve disease unspecified   3. Essential hypertension      PLAN:  In order of problems listed above:  1. SVT: although she had a history of SVT, recent EKG however suggested possible atrial fibrillation. She is not a candidate for systemic anticoagulation due to her advanced age and propensity for falls. I increased her metoprolol to 50 mg twice a day during the last office visit. She seems to be doing very well on the current dose.  2. Aortic valve insufficiency: mild-to-moderate, continue to monitor  3. Hypertension: blood pressure remained mildly  elevated, we will add hydralazine 25 mg twice a day    Medication Adjustments/Labs and Tests Ordered: Current medicines are reviewed at length with the patient today.  Concerns regarding medicines are outlined above.  Medication changes, Labs and Tests ordered today are listed in the Patient Instructions below. Patient Instructions  Medication Instructions:  START Hydralazine 25 mg take 1 tablet twice a day   Labwork: None   Testing/Procedures: None   Follow-Up: Keep upcoming appointment with Dr Debara Pickett as scheduled  Any Other Special Instructions Will Be Listed Below (If Applicable).  If you need a refill on your cardiac medications before your next appointment, please call your pharmacy.     Hilbert Corrigan, [place]  04/24/2017 2:53 PM    [facility] [place], [place], [place]  [address] Phone: [phone]; Fax: [phone]

## 2017-04-22 NOTE — Patient Instructions (Signed)
Medication Instructions:  START Hydralazine 25 mg take 1 tablet twice a day   Labwork: None   Testing/Procedures: None   Follow-Up: Keep upcoming appointment with Dr Debara Pickett as scheduled  Any Other Special Instructions Will Be Listed Below (If Applicable).  If you need a refill on your cardiac medications before your next appointment, please call your pharmacy.

## 2017-04-23 NOTE — Telephone Encounter (Signed)
REFILL 

## 2017-04-23 NOTE — Telephone Encounter (Signed)
Please review for refill, Thanks !  

## 2017-04-24 ENCOUNTER — Encounter: Payer: Self-pay | Admitting: Physician Assistant

## 2017-04-28 DIAGNOSIS — Z1231 Encounter for screening mammogram for malignant neoplasm of breast: Secondary | ICD-10-CM | POA: Diagnosis not present

## 2017-04-28 DIAGNOSIS — Z853 Personal history of malignant neoplasm of breast: Secondary | ICD-10-CM | POA: Diagnosis not present

## 2017-04-28 LAB — HM MAMMOGRAPHY

## 2017-04-30 ENCOUNTER — Encounter: Payer: Self-pay | Admitting: Internal Medicine

## 2017-04-30 NOTE — Progress Notes (Signed)
Abstracted and sent to scan  

## 2017-05-02 ENCOUNTER — Other Ambulatory Visit: Payer: Self-pay | Admitting: Internal Medicine

## 2017-05-06 ENCOUNTER — Other Ambulatory Visit: Payer: Self-pay | Admitting: Internal Medicine

## 2017-05-08 ENCOUNTER — Other Ambulatory Visit: Payer: Self-pay

## 2017-05-08 ENCOUNTER — Encounter: Payer: Self-pay | Admitting: Internal Medicine

## 2017-05-08 ENCOUNTER — Ambulatory Visit (INDEPENDENT_AMBULATORY_CARE_PROVIDER_SITE_OTHER): Payer: Medicare Other | Admitting: Internal Medicine

## 2017-05-08 DIAGNOSIS — I10 Essential (primary) hypertension: Secondary | ICD-10-CM | POA: Diagnosis not present

## 2017-05-08 MED ORDER — DICLOFENAC SODIUM 1 % TD GEL: 2.0000 g | Freq: Four times a day (QID) | TRANSDERMAL | 6 refills | Status: DC

## 2017-05-08 NOTE — Assessment & Plan Note (Signed)
We discussed risk of stroke about 5-6% per year. She is taking aspirin. Taking metoprolol 50 mg BID, amlodipine 10 mg daily, lisinopril 40 mg daily. Will take off hydralazine as BP is at goal today and she is at high likelihood of side effect with additional agent.

## 2017-05-08 NOTE — Progress Notes (Signed)
   Subjective:    Patient ID: [name], female    DOB: 1921/11/08, 83 y.o.   MRN: [id number]  HPI The patient is a 83 YO female coming in for ER follow up (in for SVT versus A fib with RVR with some medication adjustment, was feeling dizzy at the time). She has followed up with cardiology and they increased the metoprolol and then saw her back and added hydralazine. She has not started hydralazine as she was concerned about low pressure. She denies headaches, chest pains, SOB, palpitation, dizziness. She is taking metoprolol as prescribed.  Review of Systems  Constitutional: Negative.   Respiratory: Negative for cough, chest tightness and shortness of breath.   Cardiovascular: Negative for chest pain, palpitations and leg swelling.  Gastrointestinal: Negative for abdominal distention, abdominal pain, constipation, diarrhea, nausea and vomiting.  Musculoskeletal: Positive for arthralgias, gait problem and joint swelling.  Skin: Negative.   Psychiatric/Behavioral: Negative.       Objective:   Physical Exam  Constitutional: She is oriented to person, place, and time. She appears well-developed and well-nourished.  HENT:  Head: Normocephalic and atraumatic.  Eyes: EOM are normal.  Neck: Normal range of motion.  Cardiovascular: Normal rate and regular rhythm.  Pulmonary/Chest: Effort normal and breath sounds normal. No respiratory distress. She has no wheezes. She has no rales.  Abdominal: Soft. Bowel sounds are normal. She exhibits no distension. There is no tenderness. There is no rebound.  Musculoskeletal: She exhibits tenderness. She exhibits no edema.  Pain in both knees, right worse than left  Neurological: She is alert and oriented to person, place, and time. Coordination normal.  Skin: Skin is warm and dry.   Vitals:   05/08/17 1401  BP: (!) 150/80  Pulse: 70  Temp: 98.3 F (36.8 C)  TempSrc: Oral  SpO2: 100%  Weight: 172 lb (78 kg)  Height: 5\' 7"  (1.702 m)        Assessment & Plan:

## 2017-05-08 NOTE — Patient Instructions (Addendum)
We do not think you need the hydralazine.   Keep taking the amlodipine, lisinopril, and metoprolol.

## 2017-05-30 ENCOUNTER — Other Ambulatory Visit: Payer: Self-pay | Admitting: Internal Medicine

## 2017-06-01 ENCOUNTER — Other Ambulatory Visit: Payer: Self-pay | Admitting: Internal Medicine

## 2017-06-09 DIAGNOSIS — H401131 Primary open-angle glaucoma, bilateral, mild stage: Secondary | ICD-10-CM | POA: Diagnosis not present

## 2017-06-12 ENCOUNTER — Other Ambulatory Visit: Payer: Self-pay | Admitting: Internal Medicine

## 2017-07-01 ENCOUNTER — Ambulatory Visit (INDEPENDENT_AMBULATORY_CARE_PROVIDER_SITE_OTHER): Payer: Medicare Other | Admitting: Internal Medicine

## 2017-07-01 ENCOUNTER — Encounter: Payer: Self-pay | Admitting: Internal Medicine

## 2017-07-01 VITALS — BP 174/81 | HR 62 | Ht 65.0 in | Wt 171.0 lb

## 2017-07-01 DIAGNOSIS — I471 Supraventricular tachycardia: Secondary | ICD-10-CM

## 2017-07-01 DIAGNOSIS — I10 Essential (primary) hypertension: Secondary | ICD-10-CM

## 2017-07-01 DIAGNOSIS — I351 Nonrheumatic aortic (valve) insufficiency: Secondary | ICD-10-CM | POA: Diagnosis not present

## 2017-07-01 NOTE — Progress Notes (Signed)
OFFICE NOTE  Chief Complaint:  Follow-up SVT  Primary Care Physician: Hoyt Koch, MD  HPI:  [name] is a 84 y.o. female with a past medial history significant for SVT which improved with adenosine, hypertension, malnutrition and a history of upper GI bleeding.  I last saw her in 2015 recently she saw Almyra Deforest, PA-C, for tachycardia.  He felt that she might of actually had atrial fibrillation, but I disagree with this.  We discussed the possibility of anticoagulation however given her age and the fact that she does have some falls, she would not be a good candidate for anticoagulation.  Today she reports being asymptomatic.  Her EKG shows sinus rhythm.  She denies any recurrent significant palpitations.  Blood pressure is elevated however her son reports it has been elevated for years and difficult to control.  In the past she been on additional medicine but apparently her primary care provider discontinued 1 of the medicines because she felt unwell with it.  Denies any chest pain or worsening shortness of breath.  PMHx:  Past Medical History:  Diagnosis Date  . Arthritis   . Cancer [facility]) 2011, 2012   left breast cancer 2011 central excision,2012 mastectomy  . Complication of anesthesia    shortness of breath after procedure  . Glaucoma   . Hypertension   . Protein-calorie malnutrition, severe ([place])   . SVT (supraventricular tachycardia) ([place])   . Upper GI bleed 01/2013.    Melena. HPylori negative gastritis on EGD.     Past Surgical History:  Procedure Laterality Date  . ABDOMINAL HYSTERECTOMY    . BREAST SURGERY  2011  . BREAST SURGERY  2012   lt breast masty  . CHOLECYSTECTOMY    . ESOPHAGOGASTRODUODENOSCOPY N/A 01/25/2013   Procedure: ESOPHAGOGASTRODUODENOSCOPY (EGD);  Surgeon: Jerene Bears, MD;  Location: [facility];  Service: Gastroenterology;  Laterality: N/A;  . ESOPHAGOGASTRODUODENOSCOPY (EGD) WITH PROPOFOL  08/04/2014   Procedure:  ESOPHAGOGASTRODUODENOSCOPY (EGD) WITH PROPOFOL;  Surgeon: Inda Castle, MD;  Location: [facility] ENDOSCOPY;  Service: Endoscopy;;    FAMHx:  Family History  Problem Relation Age of Onset  . Coronary artery disease Mother 30       MI    SOCHx:   reports that  has never smoked. she has never used smokeless tobacco. She reports that she does not drink alcohol or use drugs.  ALLERGIES:  Allergies  Allergen Reactions  . Codeine Nausea And Vomiting    ROS: Pertinent items noted in HPI and remainder of comprehensive ROS otherwise negative.  HOME MEDS: Current Outpatient Medications on File Prior to Visit  Medication Sig Dispense Refill  . amLODipine (NORVASC) 10 MG tablet TAKE 1 TABLET(10 MG) BY MOUTH DAILY 90 tablet 3  . aspirin EC 81 MG tablet Take 1 tablet (81 mg total) by mouth daily.    . diclofenac sodium (VOLTAREN) 1 % GEL Apply 2 g topically 4 (four) times daily. 100 g 6  . lisinopril (PRINIVIL,ZESTRIL) 40 MG tablet TAKE 1 TABLET(40 MG) BY MOUTH DAILY 90 tablet 1  . metoprolol tartrate (LOPRESSOR) 50 MG tablet Take 1 tablet (50 mg total) by mouth 2 (two) times daily. 180 tablet 3  . pantoprazole (PROTONIX) 40 MG tablet TAKE 1 TABLET BY MOUTH EVERY DAY KEEP APPOINTMENT FOR REFILLS 30 tablet 2  . travoprost, benzalkonium, (TRAVATAN) 0.004 % ophthalmic solution Place 1 drop into both eyes at bedtime. 2.5 mL 12  . triamcinolone cream (KENALOG) 0.1 % Apply 1  application topically 2 (two) times daily. 30 g 2   No current facility-administered medications on file prior to visit.     LABS/IMAGING: No results found for this or any previous visit (from the past 48 hour(s)). No results found.  LIPID PANEL:    Component Value Date/Time   CHOL 216 (H) 02/09/2014 1107   TRIG 107.0 02/09/2014 1107   HDL 57.60 02/09/2014 1107   CHOLHDL 4 02/09/2014 1107   VLDL 21.4 02/09/2014 1107   LDLCALC 137 (H) 02/09/2014 1107     WEIGHTS: Wt Readings from Last 3 Encounters:  07/01/17 171 lb  (77.6 kg)  05/08/17 172 lb (78 kg)  04/22/17 171 lb (77.6 kg)    VITALS: BP (!) 174/81   Pulse 62   Ht 5\' 5"  (1.651 m)   Wt 171 lb (77.6 kg)   BMI 28.46 kg/m   EXAM: General appearance: alert and no distress Neck: no carotid bruit, no JVD and thyroid not enlarged, symmetric, no tenderness/mass/nodules Lungs: clear to auscultation bilaterally Heart: regular rate and rhythm, S1, S2 normal, no murmur, click, rub or gallop Abdomen: soft, non-tender; bowel sounds normal; no masses,  no organomegaly Extremities: extremities normal, atraumatic, no cyanosis or edema Pulses: 2+ and symmetric Skin: Skin color, texture, turgor normal. No rashes or lesions Neurologic: Grossly normal Psych: Pleasant  EKG: Sinus rhythm first-degree AV block at 62- personally reviewed  ASSESSMENT: 1. History of SVT-no recurrence 2. Hypertension-uncontrolled 3. History of mild to moderate aortic insufficiency  PLAN: 1.   Mrs. Touchette has had no recurrent SVT.  I do not believe she has had any A. fib that I am aware of.  I agree that she would not be a candidate for anticoagulation be on aspirin which she currently takes.  She is on low-dose beta-blocker which I would recommend continuing.  He does have a history of mild to moderate aortic insufficiency however it is unlikely she would either want to have aortic valve surgery or be a candidate for that in the near future if it were to worsen.  We will continue to follow clinically. Plan follow-up annually or sooner as necessary.  Pixie Casino, MD, [facility], [place] Director of the Advanced Lipid Disorders &  Cardiovascular Risk Reduction Clinic Diplomate of the American Board of Clinical Lipidology Attending Cardiologist  Direct Dial: [phone]  Fax: [phone]  Website:  [email or web address] 07/01/2017, 5:44 PM

## 2017-07-01 NOTE — Patient Instructions (Signed)
Your physician wants you to follow-up in: ONE YEAR with Dr. Hilty. You will receive a reminder letter in the mail two months in advance. If you don't receive a letter, please call our office to schedule the follow-up appointment.  

## 2017-08-14 ENCOUNTER — Other Ambulatory Visit: Payer: Self-pay | Admitting: Internal Medicine

## 2017-08-18 ENCOUNTER — Ambulatory Visit: Payer: Medicare Other | Admitting: Internal Medicine

## 2017-08-18 ENCOUNTER — Telehealth: Payer: Self-pay | Admitting: *Deleted

## 2017-08-18 ENCOUNTER — Ambulatory Visit (INDEPENDENT_AMBULATORY_CARE_PROVIDER_SITE_OTHER): Payer: Medicare Other | Admitting: *Deleted

## 2017-08-18 VITALS — BP 156/82 | HR 63 | Resp 18 | Wt 172.0 lb

## 2017-08-18 DIAGNOSIS — Z0000 Encounter for general adult medical examination without abnormal findings: Secondary | ICD-10-CM

## 2017-08-18 NOTE — Telephone Encounter (Addendum)
During AWV, patient stated she needs a refill for kenalog  Cream.  She starts to get the rash around this time of year. Patient has an upcoming appointment 09/01/17.

## 2017-08-18 NOTE — Progress Notes (Signed)
Subjective:   [name] is a 84 y.o. female who presents for an Initial Medicare Annual Wellness Visit.  Review of Systems    No ROS.  Medicare Wellness Visit. Additional risk factors are reflected in the social history.   Cardiac Risk Factors include: advanced age (>25men, >25 women);hypertension;sedentary lifestyle Sleep patterns: gets up 2 times nightly to void and sleeps 4-5  hours nightly. Patient reports insomnia issues, discussed recommended sleep tips and education provided.  Home Safety/Smoke Alarms: Feels safe in home. Smoke alarms in place.  Living environment; residence and Firearm Safety: 1-story house/ trailer, equipment: Radio producer, Type: Single Point [place] and Walkers, Type: Conservation officer, nature, no firearms. Lives with daughter, no needs for DME, good support system Seat Belt Safety/Bike Helmet: Wears seat belt.    Objective:    Today's Vitals   08/18/17 1120 08/18/17 1127  BP: (!) 156/82   Pulse: 63   Resp: 18   SpO2: 99%   Weight: 172 lb (78 kg)   PainSc:  2    Body mass index is 28.62 kg/m.  Advanced Directives 08/18/2017 03/06/2017 08/10/2014 08/03/2014 04/23/2014 02/23/2014 01/25/2013  Does Patient Have a Medical Advance Directive? No No No No No No Patient does not have advance directive  Would patient like information on creating a medical advance directive? Yes (ED - Information included in AVS) - Yes - Educational materials given - No - patient declined information No - patient declined information -  Pre-existing out of facility DNR order (yellow form or pink MOST form) - - - - - - No    Current Medications (verified) Outpatient Encounter Medications as of 08/18/2017  Medication Sig  . amLODipine (NORVASC) 10 MG tablet TAKE 1 TABLET(10 MG) BY MOUTH DAILY  . aspirin EC 81 MG tablet Take 1 tablet (81 mg total) by mouth daily.  . diclofenac sodium (VOLTAREN) 1 % GEL Apply 2 g topically 4 (four) times daily.  [name] lisinopril (PRINIVIL,ZESTRIL) 40 MG tablet TAKE 1  TABLET(40 MG) BY MOUTH DAILY  . metoprolol tartrate (LOPRESSOR) 50 MG tablet Take 1 tablet (50 mg total) by mouth 2 (two) times daily.  . pantoprazole (PROTONIX) 40 MG tablet TAKE 1 TABLET BY MOUTH EVERY DAY KEEP APPOINTMENT FOR REFILLS  . travoprost, benzalkonium, (TRAVATAN) 0.004 % ophthalmic solution Place 1 drop into both eyes at bedtime.  . triamcinolone cream (KENALOG) 0.1 % Apply 1 application topically 2 (two) times daily.   No facility-administered encounter medications on file as of 08/18/2017.     Allergies (verified) Codeine   History: Past Medical History:  Diagnosis Date  . Arthritis   . Cancer [facility]) 2011, 2012   left breast cancer 2011 central excision,2012 mastectomy  . Complication of anesthesia    shortness of breath after procedure  . Glaucoma   . Hypertension   . Protein-calorie malnutrition, severe ([place])   . SVT (supraventricular tachycardia) ([place])   . Upper GI bleed 01/2013.    Melena. HPylori negative gastritis on EGD.    Past Surgical History:  Procedure Laterality Date  . ABDOMINAL HYSTERECTOMY    . BREAST SURGERY  2011  . BREAST SURGERY  2012   lt breast masty  . CHOLECYSTECTOMY    . ESOPHAGOGASTRODUODENOSCOPY N/A 01/25/2013   Procedure: ESOPHAGOGASTRODUODENOSCOPY (EGD);  Surgeon: Jerene Bears, MD;  Location: [facility];  Service: Gastroenterology;  Laterality: N/A;  . ESOPHAGOGASTRODUODENOSCOPY (EGD) WITH PROPOFOL  08/04/2014   Procedure: ESOPHAGOGASTRODUODENOSCOPY (EGD) WITH PROPOFOL;  Surgeon: Inda Castle, MD;  Location:  [facility];  Service: Endoscopy;;   Family History  Problem Relation Age of Onset  . Coronary artery disease Mother 89       MI   Social History   Socioeconomic History  . Marital status: Widowed    Spouse name: None  . Number of children: 7  . Years of education: 8  . Highest education level: None  Social Needs  . Financial resource strain: Not hard at all  . Food insecurity - worry: Never true  . Food insecurity  - inability: Never true  . Transportation needs - medical: No  . Transportation needs - non-medical: No  Occupational History    Employer: RETIRED  Tobacco Use  . Smoking status: Never Smoker  . Smokeless tobacco: Never Used  Substance and Sexual Activity  . Alcohol use: No  . Drug use: No  . Sexual activity: No  Other Topics Concern  . None  Social History Narrative   8th grade. Married '40 - 52 yrs/widowed. 5 sons, 2 daughters; 6 grandchildren; 12 great grands.   Lives alone and manages ADLs except driving. Family is very supportive. ACP -discussed with her and referred to "[employer]. Org." (Oct '13)    Tobacco Counseling Counseling given: Not Answered  Activities of Daily Living In your present state of health, do you have any difficulty performing the following activities: 08/18/2017  Hearing? N  Vision? N  Difficulty concentrating or making decisions? Y  Walking or climbing stairs? Y  Dressing or bathing? N  Doing errands, shopping? Y  Preparing Food and eating ? Y  Using the Toilet? N  In the past six months, have you accidently leaked urine? Y  Do you have problems with loss of bowel control? N  Managing your Medications? N  Managing your Finances? Y  Housekeeping or managing your Housekeeping? Y  Some recent data might be hidden     Immunizations and Health Maintenance Immunization History  Administered Date(s) Administered  . Pneumococcal Conjugate-13 05/25/2013  . Pneumococcal Polysaccharide-23 03/30/2015   Health Maintenance Due  Topic Date Due  . TETANUS/TDAP  02[birth year]04/1941  . DEXA SCAN  02/03/1989    Patient Care Team: Hoyt Koch, MD as PCP - General (Internal Medicine)  Indicate any recent Medical Services you may have received from other than [facility] providers in the past year (date may be approximate).     Assessment:   This is a routine wellness examination for [name]. Physical assessment deferred to  PCP.   Hearing/Vision screen Hearing Screening Comments: Able to hear conversational tones w/o difficulty. No issues reported.   Vision Screening Comments: appointment every 6 months Dr. Gershon Crane   Dietary issues and exercise activities discussed: Current Exercise Habits: The patient does not participate in regular exercise at present(chair exercise pamphlets provided), Exercise limited by: orthopedic condition(s)  Diet (meal preparation, eat out, water intake, caffeinated beverages, dairy products, fruits and vegetables): in general, a "healthy" diet  , well balanced, patient reports that she has a good appetite.  Reviewed heart healthy diet, encouraged patient to increase daily water intake.    Goals    . Patient Stated     I will drink 3-4 bottles of water a day. I will place them by my TV chair to remind me. I will do the chair exercises 2-3 times per week.      Depression Screen PHQ 2/9 Scores 08/18/2017 04/16/2016 03/30/2015  PHQ - 2 Score 1 0 0  PHQ- 9 Score 6 - -  Fall Risk Fall Risk  08/18/2017 05/08/2017 04/16/2016 04/16/2016 03/30/2015  Falls in the past year? Yes No Yes No No  Comment - Emmi Telephone Survey: data to providers prior to load - - -  Number falls in past yr: 2 or more - 1 - -  Injury with Fall? - - No - -  Risk for fall due to : Impaired balance/gait;Impaired mobility - - - -  Follow up Falls prevention discussed - - - -     Cognitive Function: MMSE - Mini Mental State Exam 08/18/2017  Not completed: Unable to complete  Orientation to time 4  Orientation to Place 3  Registration 3       Ad8 score reviewed for issues:  Issues making decisions: yes  Less interest in hobbies / activities: no  Repeats questions, stories (family complaining): no  Trouble using ordinary gadgets (microwave, computer, phone):no  Forgets the month or year: yes  Mismanaging finances: yes  Remembering appts: yes  Daily problems with thinking and/or memory:  yes Ad8 score is= 5  Screening Tests Health Maintenance  Topic Date Due  . TETANUS/TDAP  02[birth year]04/1941  . DEXA SCAN  02/03/1989  . INFLUENZA VACCINE  01/08/2018 (Originally 01/01/2017)  . PNA vac Low Risk Adult  Completed      Plan:    Start doing brain stimulating activities (puzzles, reading, adult coloring books, staying active) to keep memory sharp.   Continue to eat heart healthy diet (full of fruits, vegetables, whole grains, lean protein, water--limit salt, fat, and sugar intake) and increase physical activity as tolerated.  I have personally reviewed and noted the following in the patient's chart:   . Medical and social history . Use of alcohol, tobacco or illicit drugs  . Current medications and supplements . Functional ability and status . Nutritional status . Physical activity . Advanced directives . List of other physicians . Vitals . Screenings to include cognitive, depression, and falls . Referrals and appointments  In addition, I have reviewed and discussed with patient certain preventive protocols, quality metrics, and best practice recommendations. A written personalized care plan for preventive services as well as general preventive health recommendations were provided to patient.     Michiel Cowboy, RN   08/18/2017

## 2017-08-18 NOTE — Patient Instructions (Addendum)
Start doing brain stimulating activities (puzzles, reading, adult coloring books, staying active) to keep memory sharp.   Continue to eat heart healthy diet (full of fruits, vegetables, whole grains, lean protein, water--limit salt, fat, and sugar intake) and increase physical activity as tolerated.   [name] , Thank you for taking time to come for your Medicare Wellness Visit. I appreciate your ongoing commitment to your health goals. Please review the following plan we discussed and let me know if I can assist you in the future.   These are the goals we discussed: Goals    . Patient Stated     I will drink 3-4 bottles of water a day. I will place them by my TV chair to remind me. I will do the chair exercises 2-3 times per week.       This is a list of the screening recommended for you and due dates:  Health Maintenance  Topic Date Due  . Tetanus Vaccine  02[birth year]04/1943  . DEXA scan (bone density measurement)  02/03/1989  . Flu Shot  01/08/2018*  . Pneumonia vaccines  Completed  *Topic was postponed. The date shown is not the original due date.   Insomnia Insomnia is a sleep disorder that makes it difficult to fall asleep or to stay asleep. Insomnia can cause tiredness (fatigue), low energy, difficulty concentrating, mood swings, and poor performance at work or school. There are three different ways to classify insomnia:  Difficulty falling asleep.  Difficulty staying asleep.  Waking up too early in the morning.  Any type of insomnia can be long-term (chronic) or short-term (acute). Both are common. Short-term insomnia usually lasts for three months or less. Chronic insomnia occurs at least three times a week for longer than three months. What are the causes? Insomnia may be caused by another condition, situation, or substance, such as:  Anxiety.  Certain medicines.  Gastroesophageal reflux disease (GERD) or other gastrointestinal conditions.  Asthma or other breathing  conditions.  Restless legs syndrome, sleep apnea, or other sleep disorders.  Chronic pain.  Menopause. This may include hot flashes.  Stroke.  Abuse of alcohol, tobacco, or illegal drugs.  Depression.  Caffeine.  Neurological disorders, such as Alzheimer disease.  An overactive thyroid (hyperthyroidism).  The cause of insomnia may not be known. What increases the risk? Risk factors for insomnia include:  Gender. Women are more commonly affected than men.  Age. Insomnia is more common as you get older.  Stress. This may involve your professional or personal life.  Income. Insomnia is more common in people with lower income.  Lack of exercise.  Irregular work schedule or night shifts.  Traveling between different time zones.  What are the signs or symptoms? If you have insomnia, trouble falling asleep or trouble staying asleep is the main symptom. This may lead to other symptoms, such as:  Feeling fatigued.  Feeling nervous about going to sleep.  Not feeling rested in the morning.  Having trouble concentrating.  Feeling irritable, anxious, or depressed.  How is this treated? Treatment for insomnia depends on the cause. If your insomnia is caused by an underlying condition, treatment will focus on addressing the condition. Treatment may also include:  Medicines to help you sleep.  Counseling or therapy.  Lifestyle adjustments.  Follow these instructions at home:  Take medicines only as directed by your health care provider.  Keep regular sleeping and waking hours. Avoid naps.  Keep a sleep diary to help you and your health  care provider figure out what could be causing your insomnia. Include: ? When you sleep. ? When you wake up during the night. ? How well you sleep. ? How rested you feel the next day. ? Any side effects of medicines you are taking. ? What you eat and drink.  Make your bedroom a comfortable place where it is easy to fall  asleep: ? Put up shades or special blackout curtains to block light from outside. ? Use a white noise machine to block noise. ? Keep the temperature cool.  Exercise regularly as directed by your health care provider. Avoid exercising right before bedtime.  Use relaxation techniques to manage stress. Ask your health care provider to suggest some techniques that may work well for you. These may include: ? Breathing exercises. ? Routines to release muscle tension. ? Visualizing peaceful scenes.  Cut back on alcohol, caffeinated beverages, and cigarettes, especially close to bedtime. These can disrupt your sleep.  Do not overeat or eat spicy foods right before bedtime. This can lead to digestive discomfort that can make it hard for you to sleep.  Limit screen use before bedtime. This includes: ? Watching TV. ? Using your smartphone, tablet, and computer.  Stick to a routine. This can help you fall asleep faster. Try to do a quiet activity, brush your teeth, and go to bed at the same time each night.  Get out of bed if you are still awake after 15 minutes of trying to sleep. Keep the lights down, but try reading or doing a quiet activity. When you feel sleepy, go back to bed.  Make sure that you drive carefully. Avoid driving if you feel very sleepy.  Keep all follow-up appointments as directed by your health care provider. This is important. Contact a health care provider if:  You are tired throughout the day or have trouble in your daily routine due to sleepiness.  You continue to have sleep problems or your sleep problems get worse. Get help right away if:  You have serious thoughts about hurting yourself or someone else. This information is not intended to replace advice given to you by your health care provider. Make sure you discuss any questions you have with your health care provider. Document Released: 05/17/2000 Document Revised: 10/20/2015 Document Reviewed:  02/18/2014 Elsevier Interactive Patient Education  2018 [employer].  Knee Exercises Ask your health care provider which exercises are safe for you. Do exercises exactly as told by your health care provider and adjust them as directed. It is normal to feel mild stretching, pulling, tightness, or discomfort as you do these exercises, but you should stop right away if you feel sudden pain or your pain gets worse.Do not begin these exercises until told by your health care provider. STRETCHING AND RANGE OF MOTION EXERCISES These exercises warm up your muscles and joints and improve the movement and flexibility of your knee. These exercises also help to relieve pain, numbness, and tingling. Exercise A: Knee Extension, Prone 1. Lie on your abdomen on a bed. 2. Place your left / right knee just beyond the edge of the surface so your knee is not on the bed. You can put a towel under your left / right thigh just above your knee for comfort. 3. Relax your leg muscles and allow gravity to straighten your knee. You should feel a stretch behind your left / right knee. 4. Hold this position for __________ seconds. 5. Scoot up so your knee is supported between  repetitions. Repeat __________ times. Complete this stretch __________ times a day. Exercise B: Knee Flexion, Active  1. Lie on your back with both knees straight. If this causes back discomfort, bend your left / right knee so your foot is flat on the floor. 2. Slowly slide your left / right heel back toward your buttocks until you feel a gentle stretch in the front of your knee or thigh. 3. Hold this position for __________ seconds. 4. Slowly slide your left / right heel back to the starting position. Repeat __________ times. Complete this exercise __________ times a day. Exercise C: Quadriceps, Prone  1. Lie on your abdomen on a firm surface, such as a bed or padded floor. 2. Bend your left / right knee and hold your ankle. If you cannot reach  your ankle or pant leg, loop a belt around your foot and grab the belt instead. 3. Gently pull your heel toward your buttocks. Your knee should not slide out to the side. You should feel a stretch in the front of your thigh and knee. 4. Hold this position for __________ seconds. Repeat __________ times. Complete this stretch __________ times a day. Exercise D: Hamstring, Supine 1. Lie on your back. 2. Loop a belt or towel over the ball of your left / right foot. The ball of your foot is on the walking surface, right under your toes. 3. Straighten your left / right knee and slowly pull on the belt to raise your leg until you feel a gentle stretch behind your knee. ? Do not let your left / right knee bend while you do this. ? Keep your other leg flat on the floor. 4. Hold this position for __________ seconds. Repeat __________ times. Complete this stretch __________ times a day. STRENGTHENING EXERCISES These exercises build strength and endurance in your knee. Endurance is the ability to use your muscles for a long time, even after they get tired. Exercise E: Quadriceps, Isometric  1. Lie on your back with your left / right leg extended and your other knee bent. Put a rolled towel or small pillow under your knee if told by your health care provider. 2. Slowly tense the muscles in the front of your left / right thigh. You should see your kneecap slide up toward your hip or see increased dimpling just above the knee. This motion will push the back of the knee toward the floor. 3. For __________ seconds, keep the muscle as tight as you can without increasing your pain. 4. Relax the muscles slowly and completely. Repeat __________ times. Complete this exercise __________ times a day. Exercise F: Straight Leg Raises - Quadriceps 1. Lie on your back with your left / right leg extended and your other knee bent. 2. Tense the muscles in the front of your left / right thigh. You should see your kneecap  slide up or see increased dimpling just above the knee. Your thigh may even shake a bit. 3. Keep these muscles tight as you raise your leg 4-6 inches (10-15 cm) off the floor. Do not let your knee bend. 4. Hold this position for __________ seconds. 5. Keep these muscles tense as you lower your leg. 6. Relax your muscles slowly and completely after each repetition. Repeat __________ times. Complete this exercise __________ times a day. Exercise G: Hamstring, Isometric 1. Lie on your back on a firm surface. 2. Bend your left / right knee approximately __________ degrees. 3. Dig your left / right heel into  the surface as if you are trying to pull it toward your buttocks. Tighten the muscles in the back of your thighs to dig as hard as you can without increasing any pain. 4. Hold this position for __________ seconds. 5. Release the tension gradually and allow your muscles to relax completely for __________ seconds after each repetition. Repeat __________ times. Complete this exercise __________ times a day. Exercise H: Hamstring Curls  If told by your health care provider, do this exercise while wearing ankle weights. Begin with __________ weights. Then increase the weight by 1 lb (0.5 kg) increments. Do not wear ankle weights that are more than __________. 1. Lie on your abdomen with your legs straight. 2. Bend your left / right knee as far as you can without feeling pain. Keep your hips flat against the floor. 3. Hold this position for __________ seconds. 4. Slowly lower your leg to the starting position.  Repeat __________ times. Complete this exercise __________ times a day. Exercise I: Squats (Quadriceps) 1. Stand in front of a table, with your feet and knees pointing straight ahead. You may rest your hands on the table for balance but not for support. 2. Slowly bend your knees and lower your hips like you are going to sit in a chair. ? Keep your weight over your heels, not over your  toes. ? Keep your lower legs upright so they are parallel with the table legs. ? Do not let your hips go lower than your knees. ? Do not bend lower than told by your health care provider. ? If your knee pain increases, do not bend as low. 3. Hold the squat position for __________ seconds. 4. Slowly push with your legs to return to standing. Do not use your hands to pull yourself to standing. Repeat __________ times. Complete this exercise __________ times a day. Exercise J: Wall Slides (Quadriceps)  1. Lean your back against a smooth wall or door while you walk your feet out 18-24 inches (46-61 cm) from it. 2. Place your feet hip-width apart. 3. Slowly slide down the wall or door until your knees bend __________ degrees. Keep your knees over your heels, not over your toes. Keep your knees in line with your hips. 4. Hold for __________ seconds. Repeat __________ times. Complete this exercise __________ times a day. Exercise K: Straight Leg Raises - Hip Abductors 1. Lie on your side with your left / right leg in the top position. Lie so your head, shoulder, knee, and hip line up. You may bend your bottom knee to help you keep your balance. 2. Roll your hips slightly forward so your hips are stacked directly over each other and your left / right knee is facing forward. 3. Leading with your heel, lift your top leg 4-6 inches (10-15 cm). You should feel the muscles in your outer hip lifting. ? Do not let your foot drift forward. ? Do not let your knee roll toward the ceiling. 4. Hold this position for __________ seconds. 5. Slowly return your leg to the starting position. 6. Let your muscles relax completely after each repetition. Repeat __________ times. Complete this exercise __________ times a day. Exercise L: Straight Leg Raises - Hip Extensors 1. Lie on your abdomen on a firm surface. You can put a pillow under your hips if that is more comfortable. 2. Tense the muscles in your buttocks  and lift your left / right leg about 4-6 inches (10-15 cm). Keep your knee straight as you  lift your leg. 3. Hold this position for __________ seconds. 4. Slowly lower your leg to the starting position. 5. Let your leg relax completely after each repetition. Repeat __________ times. Complete this exercise __________ times a day. This information is not intended to replace advice given to you by your health care provider. Make sure you discuss any questions you have with your health care provider. Document Released: 04/03/2005 Document Revised: 02/12/2016 Document Reviewed: 03/26/2015 Elsevier Interactive Patient Education  2018 [employer].

## 2017-08-18 NOTE — Progress Notes (Signed)
Medical screening examination/treatment/procedure(s) were performed by non-physician practitioner and as supervising physician I was immediately available for consultation/collaboration. I agree with above. Floy Angert A Laylanie Kruczek, MD 

## 2017-08-20 MED ORDER — TRIAMCINOLONE ACETONIDE 0.1 % EX CREA: 1.0000 "application " | TOPICAL_CREAM | Freq: Two times a day (BID) | CUTANEOUS | 2 refills | Status: DC

## 2017-08-20 NOTE — Telephone Encounter (Signed)
Refill done.  

## 2017-08-24 ENCOUNTER — Other Ambulatory Visit: Payer: Self-pay | Admitting: Internal Medicine

## 2017-08-25 ENCOUNTER — Ambulatory Visit: Payer: Medicare Other | Admitting: Internal Medicine

## 2017-09-01 ENCOUNTER — Encounter: Payer: Self-pay | Admitting: Internal Medicine

## 2017-09-01 ENCOUNTER — Ambulatory Visit (INDEPENDENT_AMBULATORY_CARE_PROVIDER_SITE_OTHER): Payer: Medicare Other | Admitting: Internal Medicine

## 2017-09-01 DIAGNOSIS — I10 Essential (primary) hypertension: Secondary | ICD-10-CM

## 2017-09-01 DIAGNOSIS — M150 Primary generalized (osteo)arthritis: Secondary | ICD-10-CM | POA: Diagnosis not present

## 2017-09-01 DIAGNOSIS — M159 Polyosteoarthritis, unspecified: Secondary | ICD-10-CM

## 2017-09-01 MED ORDER — LIDOCAINE 5 % EX PTCH: 1.0000 | MEDICATED_PATCH | CUTANEOUS | 0 refills | Status: DC

## 2017-09-01 NOTE — Assessment & Plan Note (Signed)
BP at goal and she is taking amlodipine and lisinopril and metoprolol. Would like to avoid other agents given age and debility to simplify regimen.

## 2017-09-01 NOTE — Progress Notes (Signed)
   Subjective:    Patient ID: [name], female    DOB: 02-Mar-1922, 84 y.o.   MRN: [id number]  HPI The patient is a 84 YO female coming in for blood pressure follow up. She is still taking her medications and denies headaches or chest pains or side effects. Still having right knee pain and voltaren gel is not helping. We have advised before tylenol for pain but she is not sure about taking it and always forgets that it is safe.   Review of Systems  Constitutional: Negative.   HENT: Negative.   Eyes: Negative.   Respiratory: Negative for cough, chest tightness and shortness of breath.   Cardiovascular: Negative for chest pain, palpitations and leg swelling.  Gastrointestinal: Negative for abdominal distention, abdominal pain, constipation, diarrhea, nausea and vomiting.  Musculoskeletal: Positive for arthralgias and myalgias.  Skin: Negative.   Neurological: Negative.   Psychiatric/Behavioral: Negative.       Objective:   Physical Exam  Constitutional: She is oriented to person, place, and time. She appears well-developed and well-nourished.  HENT:  Head: Normocephalic and atraumatic.  Eyes: EOM are normal.  Neck: Normal range of motion.  Cardiovascular: Normal rate and regular rhythm.  Pulmonary/Chest: Effort normal and breath sounds normal. No respiratory distress. She has no wheezes. She has no rales.  Abdominal: Soft.  Musculoskeletal: She exhibits no edema.  Neurological: She is alert and oriented to person, place, and time. Coordination abnormal.  Cane for ambulation  Skin: Skin is warm and dry.   Vitals:   09/01/17 1010  BP: 140/84  Pulse: 67  Temp: 97.7 F (36.5 C)  TempSrc: Oral  SpO2: 95%  Weight: 170 lb (77.1 kg)  Height: 5\' 5"  (1.651 m)      Assessment & Plan:

## 2017-09-01 NOTE — Assessment & Plan Note (Signed)
Rx for lidoderm patch as she is having pain and worries about any medications she could take orally hurting her organs.

## 2017-09-01 NOTE — Patient Instructions (Signed)
We will send in the patch for the leg that should help with the pain.   If this does not work consider seeing the sports medicine doctor.

## 2017-09-04 ENCOUNTER — Telehealth: Payer: Self-pay

## 2017-09-04 NOTE — Telephone Encounter (Signed)
PA was denied

## 2017-09-15 DIAGNOSIS — H401131 Primary open-angle glaucoma, bilateral, mild stage: Secondary | ICD-10-CM | POA: Diagnosis not present

## 2017-09-26 ENCOUNTER — Other Ambulatory Visit: Payer: Self-pay | Admitting: Internal Medicine

## 2017-10-27 ENCOUNTER — Encounter (HOSPITAL_COMMUNITY): Payer: Self-pay | Admitting: Emergency Medicine

## 2017-10-27 ENCOUNTER — Emergency Department (HOSPITAL_COMMUNITY): Payer: Medicare Other

## 2017-10-27 ENCOUNTER — Emergency Department (HOSPITAL_COMMUNITY)
Admission: EM | Admit: 2017-10-27 | Discharge: 2017-10-27 | Disposition: A | Discharge status: Home / Self Care | Payer: Medicare Other | Source: Home / Self Care | Attending: Emergency Medicine | Admitting: Emergency Medicine

## 2017-10-27 DIAGNOSIS — W19XXXA Unspecified fall, initial encounter: Secondary | ICD-10-CM | POA: Insufficient documentation

## 2017-10-27 DIAGNOSIS — G8929 Other chronic pain: Secondary | ICD-10-CM | POA: Diagnosis not present

## 2017-10-27 DIAGNOSIS — Z853 Personal history of malignant neoplasm of breast: Secondary | ICD-10-CM | POA: Insufficient documentation

## 2017-10-27 DIAGNOSIS — S32501A Unspecified fracture of right pubis, initial encounter for closed fracture: Secondary | ICD-10-CM | POA: Diagnosis not present

## 2017-10-27 DIAGNOSIS — M25552 Pain in left hip: Secondary | ICD-10-CM | POA: Diagnosis not present

## 2017-10-27 DIAGNOSIS — Z79899 Other long term (current) drug therapy: Secondary | ICD-10-CM

## 2017-10-27 DIAGNOSIS — M170 Bilateral primary osteoarthritis of knee: Secondary | ICD-10-CM | POA: Diagnosis not present

## 2017-10-27 DIAGNOSIS — S32591A Other specified fracture of right pubis, initial encounter for closed fracture: Secondary | ICD-10-CM | POA: Diagnosis not present

## 2017-10-27 DIAGNOSIS — M1990 Unspecified osteoarthritis, unspecified site: Secondary | ICD-10-CM

## 2017-10-27 DIAGNOSIS — I10 Essential (primary) hypertension: Secondary | ICD-10-CM | POA: Insufficient documentation

## 2017-10-27 DIAGNOSIS — S32511A Fracture of superior rim of right pubis, initial encounter for closed fracture: Secondary | ICD-10-CM | POA: Diagnosis not present

## 2017-10-27 DIAGNOSIS — M25551 Pain in right hip: Secondary | ICD-10-CM | POA: Diagnosis not present

## 2017-10-27 DIAGNOSIS — R269 Unspecified abnormalities of gait and mobility: Secondary | ICD-10-CM | POA: Diagnosis not present

## 2017-10-27 DIAGNOSIS — S79912A Unspecified injury of left hip, initial encounter: Secondary | ICD-10-CM | POA: Diagnosis not present

## 2017-10-27 DIAGNOSIS — S79911A Unspecified injury of right hip, initial encounter: Secondary | ICD-10-CM | POA: Diagnosis not present

## 2017-10-27 DIAGNOSIS — Z9012 Acquired absence of left breast and nipple: Secondary | ICD-10-CM | POA: Diagnosis not present

## 2017-10-27 DIAGNOSIS — M25562 Pain in left knee: Secondary | ICD-10-CM | POA: Diagnosis not present

## 2017-10-27 MED ORDER — IBUPROFEN 800 MG PO TABS
800.0000 mg | ORAL_TABLET | Freq: Once | ORAL | Status: AC
Start: 2017-10-27 — End: 2017-10-27
  Administered 2017-10-27: 800 mg via ORAL
  Filled 2017-10-27: qty 1

## 2017-10-27 NOTE — ED Provider Notes (Signed)
[facility] EMERGENCY DEPARTMENT Provider Note   CSN: [id number] Arrival date & time: 10/27/17  5784     History   Chief Complaint Chief Complaint  Patient presents with  . Fall    HPI [name] is a 84 y.o. female.  HPI  The pt had a fall this morning - she lives by herself with an aid that comes in during the day hours - when they go there to check on her this morning she was on the floor - she had been ambulating with her walker and state that her knee was hurting - (chronic pain and arthritis).  She collapsed to the ground on to her Left side and bumped her head - no LOC, no headache, she had pain in the L hip and pelvis as well as the L shoulder - she did not try to get up but waited for her aid which arrived shortly afterwards.  No other symptoms including: No fevers, chills, headache, sore throat, visual changes, neck pain, back pain, chest pain, abdominal pain, shortness of breath, cough, dysuria, diarrhea, rectal bleeding, swelling, rashes, numbness or weakness.  She has had no meds prior to arrival, She has had no prior fractures of the legs according to patient.  Sx are mild, worse with ROM, worse with walking  No deformity, no LOC, no headache, no numbness or weakness. Aid states that she falls frequently - every several months - even when she is walking with her walker and usually cites "knee gave out".   Past Medical History:  Diagnosis Date  . Arthritis   . Cancer [facility]) 2011, 2012   left breast cancer 2011 central excision,2012 mastectomy  . Complication of anesthesia    shortness of breath after procedure  . Glaucoma   . Hypertension   . Protein-calorie malnutrition, severe ([place])   . SVT (supraventricular tachycardia) ([place])   . Upper GI bleed 01/2013.    Melena. HPylori negative gastritis on EGD.     Patient Active Problem List   Diagnosis Date Noted  . Aortic valve regurgitation 07/01/2017  . Fall 12/05/2016  . Debilitated  08/10/2014  . Physical deconditioning   . Protein-calorie malnutrition, severe ([place]) 02/24/2014  . SVT (supraventricular tachycardia) ([place]) 02/23/2014  . DJD (degenerative joint disease) 04/07/2013  . Breast cancer- s/p mastectomy 03/11/2013  . GERD 03/10/2008  . GLAUCOMA 09/30/2007  . Essential hypertension 09/30/2007    Past Surgical History:  Procedure Laterality Date  . ABDOMINAL HYSTERECTOMY    . BREAST SURGERY  2011  . BREAST SURGERY  2012   lt breast masty  . CHOLECYSTECTOMY    . ESOPHAGOGASTRODUODENOSCOPY N/A 01/25/2013   Procedure: ESOPHAGOGASTRODUODENOSCOPY (EGD);  Surgeon: Jerene Bears, MD;  Location: [place];  Service: Gastroenterology;  Laterality: N/A;  . ESOPHAGOGASTRODUODENOSCOPY (EGD) WITH PROPOFOL  08/04/2014   Procedure: ESOPHAGOGASTRODUODENOSCOPY (EGD) WITH PROPOFOL;  Surgeon: Inda Castle, MD;  Location: [place];  Service: Endoscopy;;     OB History   None      Home Medications    Prior to Admission medications   Medication Sig Start Date End Date Taking? Authorizing Provider  amLODipine (NORVASC) 10 MG tablet TAKE 1 TABLET(10 MG) BY MOUTH DAILY 06/02/17   Hoyt Koch, MD  aspirin EC 81 MG tablet Take 1 tablet (81 mg total) by mouth daily. 03/19/17   Almyra Deforest, PA  diclofenac sodium (VOLTAREN) 1 % GEL Apply 2 g topically 4 (four) times daily. 05/08/17   Sharlet Salina,  Real Cons, MD  lidocaine (LIDODERM) 5 % Place 1 patch onto the skin daily. Remove & Discard patch within 12 hours or as directed by MD 09/01/17   Hoyt Koch, MD  lisinopril (PRINIVIL,ZESTRIL) 40 MG tablet TAKE 1 TABLET(40 MG) BY MOUTH DAILY 05/06/17   Hoyt Koch, MD  metoprolol tartrate (LOPRESSOR) 50 MG tablet Take 1 tablet (50 mg total) by mouth 2 (two) times daily. 03/19/17   Almyra Deforest, PA  pantoprazole (PROTONIX) 40 MG tablet TAKE 1 TABLET BY MOUTH EVERY DAY. KEEP APPOINTMENT FOR REFILLS 09/26/17   Hoyt Koch, MD  travoprost, benzalkonium,  (TRAVATAN) 0.004 % ophthalmic solution Place 1 drop into both eyes at bedtime. 08/19/14   Angiulli, Lavon Paganini, PA-C  triamcinolone cream (KENALOG) 0.1 % Apply 1 application topically 2 (two) times daily. 08/20/17   Hoyt Koch, MD    Family History Family History  Problem Relation Age of Onset  . Coronary artery disease Mother 41       MI    Social History Social History   Tobacco Use  . Smoking status: Never Smoker  . Smokeless tobacco: Never Used  Substance Use Topics  . Alcohol use: No  . Drug use: No     Allergies   Codeine   Review of Systems Review of Systems  Constitutional: Negative for fever.  HENT: Negative for rhinorrhea and sore throat.   Eyes: Negative for redness.  Respiratory: Negative for cough and shortness of breath.   Cardiovascular: Negative for chest pain.  Gastrointestinal: Negative for abdominal pain, diarrhea, nausea and vomiting.  Genitourinary: Negative for dysuria.  Musculoskeletal: Positive for arthralgias, gait problem and joint swelling ( chronic). Negative for back pain, myalgias and neck pain.  Neurological: Negative for seizures, syncope, weakness, numbness and headaches.  Hematological: Does not bruise/bleed easily.     Physical Exam Updated Vital Signs BP (!) 152/86 (BP Location: Right Arm)   Pulse 82   Temp 97.6 F (36.4 C) (Oral)   Resp 18   SpO2 99%   Physical Exam  Constitutional: She appears well-developed and well-nourished. No distress.  HENT:  Head: Normocephalic and atraumatic.  Mouth/Throat: Oropharynx is clear and moist. No oropharyngeal exudate.  There is no signs of trauma to the head - no ttp to palpation of the skull / scalp - no hematoma, no laceration, no periorbital bruising, no malocclusion  Eyes: Pupils are equal, round, and reactive to light. Conjunctivae and EOM are normal. Right eye exhibits no discharge. Left eye exhibits no discharge. No scleral icterus.  Neck: Normal range of motion. Neck  supple. No JVD present. No thyromegaly present.  Cardiovascular: Normal rate, regular rhythm, normal heart sounds and intact distal pulses. Exam reveals no gallop and no friction rub.  No murmur heard. Pulmonary/Chest: Effort normal and breath sounds normal. No respiratory distress. She has no wheezes. She has no rales.  Abdominal: Soft. Bowel sounds are normal. She exhibits no distension and no mass. There is no tenderness.  Musculoskeletal: Normal range of motion. She exhibits tenderness ( has ttp with ROM of the hips and knees on the left - no R sided pain with ROM of major joints.). She exhibits no edema.  Has ttp over the pelvis but no instability palpated.  She can straight leg raise bilaterally though with pain on the L - has normal rotation of the L hip.  Lymphadenopathy:    She has no cervical adenopathy.  Neurological: She is alert. Coordination normal.  Speech  is clear - coordination is normal - answers questions appropriately, no facial droop - equal grips.  Skin: Skin is warm and dry. No rash noted. No erythema.  No rashes, no wounds  Psychiatric: She has a normal mood and affect. Her behavior is normal.  Nursing note and vitals reviewed.    ED Treatments / Results  Labs (all labs ordered are listed, but only abnormal results are displayed) Labs Reviewed - No data to display  EKG None  Radiology Dg Knee Complete 4 Views Left  Result Date: 10/27/2017 CLINICAL DATA:  Pain following falls EXAM: LEFT KNEE - COMPLETE 4+ VIEW COMPARISON:  None. FINDINGS: Frontal, lateral, and bilateral oblique views were obtained. Bones are osteoporotic. There is no fracture or dislocation. There is no appreciable joint effusion. There is moderate generalized joint space narrowing. There is spurring in all compartments. There is extensive chondrocalcinosis. There is calcification in the popliteal artery and trifurcation artery regions. IMPRESSION: Bones osteoporotic. No fracture or dislocation. No  joint effusion. There is generalized osteoarthritic change. Chondrocalcinosis present, a finding that may be associated with osteoarthritis or calcium pyrophosphate deposition disease. Foci of arterial atherosclerosis noted. Electronically Signed   By: Lowella Grip III M.D.   On: 10/27/2017 09:51   Dg Hips Bilat W Or Wo Pelvis 3-4 Views  Addendum Date: 10/27/2017   ADDENDUM REPORT: 10/27/2017 09:53 ADDENDUM: Study compared to pelvis radiograph December 27, 2005 Electronically Signed   By: Lowella Grip III M.D.   On: 10/27/2017 09:53   Result Date: 10/27/2017 CLINICAL DATA:  Pain following fall EXAM: DG HIP (WITH OR WITHOUT PELVIS) 3-4V BILAT COMPARISON:  None. FINDINGS: Frontal pelvis as well as frontal and lateral views of each hip-total five views-obtained. There is no acute fracture or dislocation. There is moderate symmetric narrowing of both hip joints. There is Paget's disease throughout the right iliac bone including the right acetabulum as well as the right ischium. Bones are diffusely osteoporotic. There is degenerative change in visualized lumbar spine. IMPRESSION: No acute fracture or dislocation. Paget's disease throughout the right iliac bone and right ischium. Bones diffusely osteoporotic. Moderate symmetric narrowing both hip joints. Electronically Signed: By: Lowella Grip III M.D. On: 10/27/2017 09:49    Procedures Procedures (including critical care time)  Medications Ordered in ED Medications  ibuprofen (ADVIL,MOTRIN) tablet 800 mg (800 mg Oral Given 10/27/17 0916)     Initial Impression / Assessment and Plan / ED Course  I have reviewed the triage vital signs and the nursing notes.  Pertinent labs & imaging results that were available during my care of the patient were reviewed by me and considered in my medical decision making (see chart for details).  Clinical Course as of Oct 28 1007  Mon Oct 27, 2017  1287 X-rays reviewed and are negative for any signs of  fracture of the pelvis the hips or the knee.   [BM]    Clinical Course User Index [BM] Noemi Chapel, MD   Needs to rule out fracture with x-ray of the knee and the hip and pelvis, no other traumatic injuries on exam.  This seems to be something that she has frequently with her falls, I do not see any definite deformities, she does not appear ill and has no complaints of cardiac or pulmonary or neurologic findings.   Final Clinical Impressions(s) / ED Diagnoses   Final diagnoses:  Fall, initial encounter  Arthritis    ED Discharge Orders    None  Noemi Chapel, MD 10/27/17 1009

## 2017-10-27 NOTE — ED Triage Notes (Signed)
Pt states she fell this morning. She was walking and her left knee gave out. States this has happened before. Pt hit her left side and head. Denies LOC. Pt is not on blood thinners. Pt alert and oriented and able to provide history of event

## 2017-10-27 NOTE — Discharge Instructions (Signed)
Your xrays show that you have chronic arthritis but thankfully, no signs of fractures of the hips, pelvis or knee - please take tylenol or ibuprofen as needed - rest today and minimize your walking, then slowly return to normal activity in next 2-3 days.

## 2017-10-27 NOTE — ED Notes (Signed)
Pt discharged from ED; instructions provided; Pt encouraged to return to ED if symptoms worsen and to f/u with PCP; Pt verbalized understanding of all instructions 

## 2017-10-28 ENCOUNTER — Emergency Department (HOSPITAL_COMMUNITY): Payer: Medicare Other

## 2017-10-28 ENCOUNTER — Ambulatory Visit: Payer: Self-pay | Admitting: Internal Medicine

## 2017-10-28 ENCOUNTER — Inpatient Hospital Stay (HOSPITAL_COMMUNITY)
Admission: EM | Admit: 2017-10-28 | Discharge: 2017-10-31 | DRG: 536 | Disposition: A | Discharge status: Skilled Nursing Facility | Payer: Medicare Other | Attending: Internal Medicine | Admitting: Internal Medicine

## 2017-10-28 ENCOUNTER — Encounter (HOSPITAL_COMMUNITY): Payer: Self-pay | Admitting: Family Medicine

## 2017-10-28 DIAGNOSIS — I10 Essential (primary) hypertension: Secondary | ICD-10-CM | POA: Diagnosis not present

## 2017-10-28 DIAGNOSIS — Z853 Personal history of malignant neoplasm of breast: Secondary | ICD-10-CM | POA: Diagnosis not present

## 2017-10-28 DIAGNOSIS — H409 Unspecified glaucoma: Secondary | ICD-10-CM | POA: Diagnosis present

## 2017-10-28 DIAGNOSIS — Z79899 Other long term (current) drug therapy: Secondary | ICD-10-CM

## 2017-10-28 DIAGNOSIS — S32591A Other specified fracture of right pubis, initial encounter for closed fracture: Secondary | ICD-10-CM | POA: Diagnosis present

## 2017-10-28 DIAGNOSIS — R2689 Other abnormalities of gait and mobility: Secondary | ICD-10-CM | POA: Diagnosis not present

## 2017-10-28 DIAGNOSIS — I471 Supraventricular tachycardia: Secondary | ICD-10-CM | POA: Diagnosis not present

## 2017-10-28 DIAGNOSIS — I351 Nonrheumatic aortic (valve) insufficiency: Secondary | ICD-10-CM | POA: Diagnosis not present

## 2017-10-28 DIAGNOSIS — M8888 Osteitis deformans of other bones: Secondary | ICD-10-CM | POA: Diagnosis not present

## 2017-10-28 DIAGNOSIS — S32501A Unspecified fracture of right pubis, initial encounter for closed fracture: Secondary | ICD-10-CM | POA: Diagnosis not present

## 2017-10-28 DIAGNOSIS — S32511A Fracture of superior rim of right pubis, initial encounter for closed fracture: Secondary | ICD-10-CM | POA: Diagnosis not present

## 2017-10-28 DIAGNOSIS — M2550 Pain in unspecified joint: Secondary | ICD-10-CM | POA: Diagnosis not present

## 2017-10-28 DIAGNOSIS — G8929 Other chronic pain: Secondary | ICD-10-CM | POA: Diagnosis present

## 2017-10-28 DIAGNOSIS — W1830XA Fall on same level, unspecified, initial encounter: Secondary | ICD-10-CM | POA: Diagnosis present

## 2017-10-28 DIAGNOSIS — S32599A Other specified fracture of unspecified pubis, initial encounter for closed fracture: Secondary | ICD-10-CM | POA: Diagnosis present

## 2017-10-28 DIAGNOSIS — Z7982 Long term (current) use of aspirin: Secondary | ICD-10-CM | POA: Diagnosis not present

## 2017-10-28 DIAGNOSIS — T148XXA Other injury of unspecified body region, initial encounter: Secondary | ICD-10-CM | POA: Diagnosis not present

## 2017-10-28 DIAGNOSIS — K219 Gastro-esophageal reflux disease without esophagitis: Secondary | ICD-10-CM | POA: Diagnosis not present

## 2017-10-28 DIAGNOSIS — R262 Difficulty in walking, not elsewhere classified: Secondary | ICD-10-CM

## 2017-10-28 DIAGNOSIS — M1990 Unspecified osteoarthritis, unspecified site: Secondary | ICD-10-CM | POA: Diagnosis not present

## 2017-10-28 DIAGNOSIS — Z66 Do not resuscitate: Secondary | ICD-10-CM | POA: Diagnosis present

## 2017-10-28 DIAGNOSIS — M6281 Muscle weakness (generalized): Secondary | ICD-10-CM | POA: Diagnosis not present

## 2017-10-28 DIAGNOSIS — M170 Bilateral primary osteoarthritis of knee: Secondary | ICD-10-CM | POA: Diagnosis present

## 2017-10-28 DIAGNOSIS — S32591S Other specified fracture of right pubis, sequela: Secondary | ICD-10-CM | POA: Diagnosis not present

## 2017-10-28 DIAGNOSIS — M79605 Pain in left leg: Secondary | ICD-10-CM | POA: Diagnosis not present

## 2017-10-28 DIAGNOSIS — Z9012 Acquired absence of left breast and nipple: Secondary | ICD-10-CM

## 2017-10-28 DIAGNOSIS — Z7401 Bed confinement status: Secondary | ICD-10-CM | POA: Diagnosis not present

## 2017-10-28 DIAGNOSIS — R269 Unspecified abnormalities of gait and mobility: Secondary | ICD-10-CM | POA: Diagnosis not present

## 2017-10-28 DIAGNOSIS — R296 Repeated falls: Secondary | ICD-10-CM | POA: Diagnosis not present

## 2017-10-28 LAB — BASIC METABOLIC PANEL
Anion gap: 11 (ref 5–15)
BUN: 15 mg/dL (ref 6–20)
CO2: 24 mmol/L (ref 22–32)
Calcium: 9.2 mg/dL (ref 8.9–10.3)
Chloride: 107 mmol/L (ref 101–111)
Creatinine, Ser: 0.84 mg/dL (ref 0.44–1.00)
GFR calc Af Amer: >60 mL/min (ref >60)
GFR calc non Af Amer: 58 mL/min — ABNORMAL LOW (ref >60)
Glucose, Bld: 104 mg/dL — ABNORMAL HIGH (ref 65–99)
Potassium: 4.4 mmol/L (ref 3.5–5.1)
Sodium: 142 mmol/L (ref 135–145)

## 2017-10-28 LAB — URINALYSIS, ROUTINE W REFLEX MICROSCOPIC
Bilirubin Urine: NEGATIVE
Glucose, UA: NEGATIVE mg/dL
Ketones, ur: 5 mg/dL — AB
Leukocytes, UA: NEGATIVE
Nitrite: NEGATIVE
Protein, ur: NEGATIVE mg/dL
Specific Gravity, Urine: 1.012 (ref 1.005–1.030)
pH: 6 (ref 5.0–8.0)

## 2017-10-28 LAB — CBC WITH DIFFERENTIAL/PLATELET
Abs Immature Granulocytes: 0 10*3/uL (ref 0.0–0.1)
Basophils Absolute: 0 10*3/uL (ref 0.0–0.1)
Basophils Relative: 0 %
Eosinophils Absolute: 0.4 10*3/uL (ref 0.0–0.7)
Eosinophils Relative: 4 %
HCT: 40.6 % (ref 36.0–46.0)
Hemoglobin: 12.9 g/dL (ref 12.0–15.0)
Immature Granulocytes: 0 %
Lymphocytes Relative: 16 %
Lymphs Abs: 1.5 10*3/uL (ref 0.7–4.0)
MCH: 26.7 pg (ref 26.0–34.0)
MCHC: 31.8 g/dL (ref 30.0–36.0)
MCV: 84.1 fL (ref 78.0–100.0)
Monocytes Absolute: 0.7 10*3/uL (ref 0.1–1.0)
Monocytes Relative: 8 %
Neutro Abs: 6.7 10*3/uL (ref 1.7–7.7)
Neutrophils Relative %: 72 %
Platelets: 394 10*3/uL (ref 150–400)
RBC: 4.83 MIL/uL (ref 3.87–5.11)
RDW: 15.1 % (ref 11.5–15.5)
WBC: 9.3 10*3/uL (ref 4.0–10.5)

## 2017-10-28 MED ORDER — ASPIRIN EC 81 MG PO TBEC
81.0000 mg | DELAYED_RELEASE_TABLET | Freq: Every day | ORAL | Status: DC
  Administered 2017-10-29 – 2017-10-31 (×3): 81 mg via ORAL
  Filled 2017-10-28 (×3): qty 1

## 2017-10-28 MED ORDER — LISINOPRIL 40 MG PO TABS
40.0000 mg | ORAL_TABLET | Freq: Every day | ORAL | Status: DC
  Administered 2017-10-29 – 2017-10-31 (×3): 40 mg via ORAL
  Filled 2017-10-28 (×3): qty 1

## 2017-10-28 MED ORDER — TRAVOPROST (BAK FREE) 0.004 % OP SOLN
1.0000 [drp] | Freq: Every day | OPHTHALMIC | Status: DC
  Administered 2017-10-29 – 2017-10-30 (×2): 1 [drp] via OPHTHALMIC
  Filled 2017-10-28: qty 2.5

## 2017-10-28 MED ORDER — ONDANSETRON HCL 4 MG PO TABS: 4.0000 mg | ORAL_TABLET | Freq: Four times a day (QID) | ORAL | Status: DC | PRN

## 2017-10-28 MED ORDER — HYDROCODONE-ACETAMINOPHEN 5-325 MG PO TABS
1.0000 | ORAL_TABLET | ORAL | Status: DC | PRN
  Administered 2017-10-29: 1 via ORAL
  Administered 2017-10-29: 2 via ORAL
  Administered 2017-10-30 (×2): 1 via ORAL
  Filled 2017-10-28 (×3): qty 1
  Filled 2017-10-28: qty 2

## 2017-10-28 MED ORDER — METOPROLOL TARTRATE 50 MG PO TABS
50.0000 mg | ORAL_TABLET | Freq: Two times a day (BID) | ORAL | Status: DC
  Administered 2017-10-28 – 2017-10-31 (×6): 50 mg via ORAL
  Filled 2017-10-28: qty 2
  Filled 2017-10-28 (×5): qty 1

## 2017-10-28 MED ORDER — ENOXAPARIN SODIUM 40 MG/0.4ML ~~LOC~~ SOLN
40.0000 mg | SUBCUTANEOUS | Status: DC
  Administered 2017-10-29 – 2017-10-31 (×3): 40 mg via SUBCUTANEOUS
  Filled 2017-10-28 (×3): qty 0.4

## 2017-10-28 MED ORDER — ACETAMINOPHEN 650 MG RE SUPP: 650.0000 mg | Freq: Four times a day (QID) | RECTAL | Status: DC | PRN

## 2017-10-28 MED ORDER — ACETAMINOPHEN 325 MG PO TABS
650.0000 mg | ORAL_TABLET | Freq: Four times a day (QID) | ORAL | Status: DC | PRN
  Administered 2017-10-29 – 2017-10-30 (×2): 650 mg via ORAL
  Filled 2017-10-28 (×2): qty 2

## 2017-10-28 MED ORDER — SODIUM CHLORIDE 0.9 % IV SOLN: 250.0000 mL | INTRAVENOUS | Status: DC | PRN

## 2017-10-28 MED ORDER — BISACODYL 5 MG PO TBEC
5.0000 mg | DELAYED_RELEASE_TABLET | Freq: Every day | ORAL | Status: DC | PRN
  Administered 2017-10-29: 5 mg via ORAL
  Filled 2017-10-28: qty 1

## 2017-10-28 MED ORDER — SODIUM CHLORIDE 0.9% FLUSH: 3.0000 mL | INTRAVENOUS | Status: DC | PRN

## 2017-10-28 MED ORDER — SODIUM CHLORIDE 0.9% FLUSH
3.0000 mL | Freq: Two times a day (BID) | INTRAVENOUS | Status: DC
  Administered 2017-10-29 – 2017-10-31 (×4): 3 mL via INTRAVENOUS

## 2017-10-28 MED ORDER — PANTOPRAZOLE SODIUM 40 MG PO TBEC
40.0000 mg | DELAYED_RELEASE_TABLET | Freq: Every day | ORAL | Status: DC
  Administered 2017-10-29 – 2017-10-31 (×3): 40 mg via ORAL
  Filled 2017-10-28 (×3): qty 1

## 2017-10-28 MED ORDER — SENNOSIDES-DOCUSATE SODIUM 8.6-50 MG PO TABS
1.0000 | ORAL_TABLET | Freq: Every evening | ORAL | Status: DC | PRN
  Administered 2017-10-29: 1 via ORAL
  Filled 2017-10-28: qty 1

## 2017-10-28 MED ORDER — ONDANSETRON HCL 4 MG/2ML IJ SOLN: 4.0000 mg | Freq: Four times a day (QID) | INTRAMUSCULAR | Status: DC | PRN

## 2017-10-28 MED ORDER — DICLOFENAC SODIUM 1 % TD GEL
2.0000 g | Freq: Four times a day (QID) | TRANSDERMAL | Status: DC
  Administered 2017-10-29 – 2017-10-31 (×10): 2 g via TOPICAL
  Filled 2017-10-28: qty 100

## 2017-10-28 MED ORDER — AMLODIPINE BESYLATE 10 MG PO TABS
10.0000 mg | ORAL_TABLET | Freq: Every day | ORAL | Status: DC
  Administered 2017-10-29 – 2017-10-31 (×3): 10 mg via ORAL
  Filled 2017-10-28 (×3): qty 1

## 2017-10-28 NOTE — Telephone Encounter (Signed)
FYI

## 2017-10-28 NOTE — ED Provider Notes (Signed)
[facility] EMERGENCY DEPARTMENT Provider Note   CSN: [id number] Arrival date & time: 10/28/17  1526     History   Chief Complaint Chief Complaint  Patient presents with  . Leg Pain    HPI [name] is a 84 y.o. female.  HPI Patient presents to the emergency department with inability to ambulate status post fall from yesterday.  Patient was seen in the emergency department yesterday and had negative plain films of the hip and pelvis along with the right knee.  Family states that since leaving the emergency department she is been unable to walk.  The family states that she is not able to get to the bathroom and help with her normal daily activities.  The patient normally ambulates with a cane or walker.  Family states that since the fall she has been unable to do any of these tasks.  The patient denies chest pain, shortness of breath, headache,blurred vision, neck pain, fever, cough, weakness, numbness, dizziness, anorexia, edema, abdominal pain, nausea, vomiting, diarrhea, rash, back pain, dysuria, hematemesis, bloody stool, near syncope, or syncope. Past Medical History:  Diagnosis Date  . Arthritis   . Cancer [facility]) 2011, 2012   left breast cancer 2011 central excision,2012 mastectomy  . Complication of anesthesia    shortness of breath after procedure  . Glaucoma   . Hypertension   . Protein-calorie malnutrition, severe ([place])   . SVT (supraventricular tachycardia) ([place])   . Upper GI bleed 01/2013.    Melena. HPylori negative gastritis on EGD.     Patient Active Problem List   Diagnosis Date Noted  . Pubic ramus fracture ([place]) 10/28/2017  . Aortic valve regurgitation 07/01/2017  . Fall 12/05/2016  . Debilitated 08/10/2014  . Physical deconditioning   . Protein-calorie malnutrition, severe ([place]) 02/24/2014  . SVT (supraventricular tachycardia) ([place]) 02/23/2014  . DJD (degenerative joint disease) 04/07/2013  . Breast cancer- s/p mastectomy  03/11/2013  . GERD 03/10/2008  . GLAUCOMA 09/30/2007  . Hypertension 09/30/2007    Past Surgical History:  Procedure Laterality Date  . ABDOMINAL HYSTERECTOMY    . BREAST SURGERY  2011  . BREAST SURGERY  2012   lt breast masty  . CHOLECYSTECTOMY    . ESOPHAGOGASTRODUODENOSCOPY N/A 01/25/2013   Procedure: ESOPHAGOGASTRODUODENOSCOPY (EGD);  Surgeon: Jerene Bears, MD;  Location: [facility];  Service: Gastroenterology;  Laterality: N/A;  . ESOPHAGOGASTRODUODENOSCOPY (EGD) WITH PROPOFOL  08/04/2014   Procedure: ESOPHAGOGASTRODUODENOSCOPY (EGD) WITH PROPOFOL;  Surgeon: Inda Castle, MD;  Location: [facility];  Service: Endoscopy;;     OB History   None      Home Medications    Prior to Admission medications   Medication Sig Start Date End Date Taking? Authorizing Provider  acetaminophen (TYLENOL) 325 MG tablet Take 325-650 mg by mouth every 6 (six) hours as needed (for pain).   Yes [provider]  amLODipine (NORVASC) 10 MG tablet TAKE 1 TABLET(10 MG) BY MOUTH DAILY 06/02/17  Yes Hoyt Koch, MD  aspirin EC 81 MG tablet Take 1 tablet (81 mg total) by mouth daily. 03/19/17  Yes Almyra Deforest, PA  diclofenac sodium (VOLTAREN) 1 % GEL Apply 2 g topically 4 (four) times daily. Patient taking differently: Apply 2 g topically See admin instructions. Apply 2 grams to both knees for pain four times a day 05/08/17  Yes Hoyt Koch, MD  lisinopril (PRINIVIL,ZESTRIL) 40 MG tablet TAKE 1 TABLET(40 MG) BY MOUTH DAILY 05/06/17  Yes Hoyt Koch,  MD  metoprolol tartrate (LOPRESSOR) 50 MG tablet Take 1 tablet (50 mg total) by mouth 2 (two) times daily. 03/19/17  Yes Meng, Isaac Laud, PA  pantoprazole (PROTONIX) 40 MG tablet TAKE 1 TABLET BY MOUTH EVERY DAY. KEEP APPOINTMENT FOR REFILLS Patient taking differently: Take 40 mg by mouth once a day 09/26/17  Yes Hoyt Koch, MD  travoprost, benzalkonium, (TRAVATAN) 0.004 % ophthalmic solution Place 1 drop into both  eyes at bedtime. 08/19/14  Yes Angiulli, Lavon Paganini, PA-C  triamcinolone cream (KENALOG) 0.1 % Apply 1 application topically 2 (two) times daily. 08/20/17  Yes Hoyt Koch, MD  lidocaine (LIDODERM) 5 % Place 1 patch onto the skin daily. Remove & Discard patch within 12 hours or as directed by MD Patient not taking: Reported on 10/28/2017 09/01/17   Hoyt Koch, MD    Family History Family History  Problem Relation Age of Onset  . Coronary artery disease Mother 30       MI    Social History Social History   Tobacco Use  . Smoking status: Never Smoker  . Smokeless tobacco: Never Used  Substance Use Topics  . Alcohol use: No  . Drug use: No     Allergies   Codeine   Review of Systems Review of Systems All other systems negative except as documented in the HPI. All pertinent positives and negatives as reviewed in the HPI.  Physical Exam Updated Vital Signs BP (!) 171/85   Pulse 81   Temp 99.5 F (37.5 C) (Oral)   Resp 20   SpO2 95%   Physical Exam  Constitutional: She is oriented to person, place, and time. She appears well-developed and well-nourished. No distress.  HENT:  Head: Normocephalic and atraumatic.  Mouth/Throat: Oropharynx is clear and moist.  Eyes: Pupils are equal, round, and reactive to light.  Neck: Normal range of motion. Neck supple.  Cardiovascular: Normal rate, regular rhythm and normal heart sounds. Exam reveals no gallop and no friction rub.  No murmur heard. Pulmonary/Chest: Effort normal and breath sounds normal. No respiratory distress. She has no wheezes.  Abdominal: Soft. Bowel sounds are normal. She exhibits no distension. There is no tenderness.  Musculoskeletal:       Right hip: She exhibits decreased range of motion and tenderness. She exhibits no swelling, no crepitus and no deformity.       Legs: Neurological: She is alert and oriented to person, place, and time. She exhibits normal muscle tone. Coordination normal.    Skin: Skin is warm and dry. Capillary refill takes less than 2 seconds. No rash noted. No erythema.  Psychiatric: She has a normal mood and affect. Her behavior is normal.  Nursing note and vitals reviewed.    ED Treatments / Results  Labs (all labs ordered are listed, but only abnormal results are displayed) Labs Reviewed  BASIC METABOLIC PANEL - Abnormal; Notable for the following components:      Result Value   Glucose, Bld 104 (*)    GFR calc non Af Amer 58 (*)    All other components within normal limits  CBC WITH DIFFERENTIAL/PLATELET  URINALYSIS, ROUTINE W REFLEX MICROSCOPIC    EKG None  Radiology Ct Hip Right Wo Contrast  Result Date: 10/28/2017 CLINICAL DATA:  Fall yesterday.  Hip pain EXAM: CT OF THE RIGHT HIP WITHOUT CONTRAST TECHNIQUE: Multidetector CT imaging of the right hip was performed according to the standard protocol. Multiplanar CT image reconstructions were also generated. COMPARISON:  10/27/2017  FINDINGS: Bones/Joint/Cartilage Slightly displaced fracture inferior pubic ramus on the right. No other pelvic fracture. No fracture of the femur. Mild degenerative change in the right hip with joint space narrowing and acetabular spurring Mixed sclerotic and lytic bony abnormality through the right throughout the right hemipelvis consistent with Paget's disease as noted on x-ray from yesterday. Ligaments Suboptimally assessed by CT. Muscles and Tendons Negative Soft tissues Atherosclerotic calcification.  No soft tissue mass IMPRESSION: Slightly displaced fracture inferior pubic ramus on the right. Negative for femur fracture. Paget's disease right hemipelvis Degenerative change in the right hip joint. Electronically Signed   By: Franchot Gallo M.D.   On: 10/28/2017 18:31   Dg Knee Complete 4 Views Left  Result Date: 10/27/2017 CLINICAL DATA:  Pain following falls EXAM: LEFT KNEE - COMPLETE 4+ VIEW COMPARISON:  None. FINDINGS: Frontal, lateral, and bilateral oblique  views were obtained. Bones are osteoporotic. There is no fracture or dislocation. There is no appreciable joint effusion. There is moderate generalized joint space narrowing. There is spurring in all compartments. There is extensive chondrocalcinosis. There is calcification in the popliteal artery and trifurcation artery regions. IMPRESSION: Bones osteoporotic. No fracture or dislocation. No joint effusion. There is generalized osteoarthritic change. Chondrocalcinosis present, a finding that may be associated with osteoarthritis or calcium pyrophosphate deposition disease. Foci of arterial atherosclerosis noted. Electronically Signed   By: Lowella Grip III M.D.   On: 10/27/2017 09:51   Dg Hips Bilat W Or Wo Pelvis 3-4 Views  Addendum Date: 10/27/2017   ADDENDUM REPORT: 10/27/2017 09:53 ADDENDUM: Study compared to pelvis radiograph December 27, 2005 Electronically Signed   By: Lowella Grip III M.D.   On: 10/27/2017 09:53   Result Date: 10/27/2017 CLINICAL DATA:  Pain following fall EXAM: DG HIP (WITH OR WITHOUT PELVIS) 3-4V BILAT COMPARISON:  None. FINDINGS: Frontal pelvis as well as frontal and lateral views of each hip-total five views-obtained. There is no acute fracture or dislocation. There is moderate symmetric narrowing of both hip joints. There is Paget's disease throughout the right iliac bone including the right acetabulum as well as the right ischium. Bones are diffusely osteoporotic. There is degenerative change in visualized lumbar spine. IMPRESSION: No acute fracture or dislocation. Paget's disease throughout the right iliac bone and right ischium. Bones diffusely osteoporotic. Moderate symmetric narrowing both hip joints. Electronically Signed: By: Lowella Grip III M.D. On: 10/27/2017 09:49    Procedures Procedures (including critical care time)  Medications Ordered in ED Medications - No data to display   Initial Impression / Assessment and Plan / ED Course  I have reviewed  the triage vital signs and the nursing notes.  Pertinent labs & imaging results that were available during my care of the patient were reviewed by me and considered in my medical decision making (see chart for details).     Patient need admission to the hospital for further evaluation and care and rehabilitation services.  Patient has been otherwise stable here in the emergency department.  I did attempt to stand the patient and she was able to do so without significant difficulties.  Spoke with the [facility] who will admit the patient.  Final Clinical Impressions(s) / ED Diagnoses   Final diagnoses:  Impaired ambulation  Closed fracture of ramus of right pubis, initial encounter [facility])    ED Discharge Orders    None       Dalia Heading, PA-C 10/29/17 2458    Tegeler, Gwenyth Allegra, MD 10/30/17 [phone]

## 2017-10-28 NOTE — ED Triage Notes (Signed)
Pt arrived via [facility] from home after family reported pt stated she cannot walk today after yesterday's fall. Pt was seen yesterday after falling and was discharged home. Family at bedside. Pt is alert and oriented x4. Ems VS 136/84, hr 82, Sp02 98% ra.

## 2017-10-28 NOTE — H&P (Signed)
History and Physical    [name]:[id number] DOB: 03/26/1922 DOA: 10/28/2017  PCP: Hoyt Koch, MD   Patient coming from: Home  Chief Complaint: Fall with right groin and bilateral knee pain  HPI: [name] is a 84 y.o. female with medical history significant for hypertension and cancer of the left breast status post mastectomy, now presenting to the emergency department with pain in the bilateral knees and right groin after a fall yesterday.  Patient reports chronic bilateral knee pain attributed to arthritis and has had occasional falls that she attributes to her knees "giving out."  At baseline, she uses a cane to ambulate around the house where she lives with her daughter.  She had a ground-level mechanical fall yesterday without hitting her head or losing consciousness and has been unable to bear weight since then due to severe pain.  She denies hitting her head or losing consciousness and denies any recent illness, chest pain, or shortness of breath.  ED Course: Upon arrival to the ED, patient is found to be afebrile, saturating well on room air, and with vitals otherwise normal.  Chemistry panel and CBC are unremarkable.  CT of the right hip features a slightly displaced fracture of the right inferior pubic ramus.  Orthopedic surgery was consulted by the ED physician.  Patient remains stable, is unable to bear weight, and will be admitted for ongoing evaluation and management.  Review of Systems:  All other systems reviewed and apart from HPI, are negative.  Past Medical History:  Diagnosis Date  . Arthritis   . Cancer [name]) 2011, 2012   left breast cancer 2011 central excision,2012 mastectomy  . Complication of anesthesia    shortness of breath after procedure  . Glaucoma   . Hypertension   . Protein-calorie malnutrition, severe ([name])   . SVT (supraventricular tachycardia) ([place])   . Upper GI bleed 01/2013.    Melena. HPylori negative gastritis on EGD.       Past Surgical History:  Procedure Laterality Date  . ABDOMINAL HYSTERECTOMY    . BREAST SURGERY  2011  . BREAST SURGERY  2012   lt breast masty  . CHOLECYSTECTOMY    . ESOPHAGOGASTRODUODENOSCOPY N/A 01/25/2013   Procedure: ESOPHAGOGASTRODUODENOSCOPY (EGD);  Surgeon: Jerene Bears, MD;  Location: [place];  Service: Gastroenterology;  Laterality: N/A;  . ESOPHAGOGASTRODUODENOSCOPY (EGD) WITH PROPOFOL  08/04/2014   Procedure: ESOPHAGOGASTRODUODENOSCOPY (EGD) WITH PROPOFOL;  Surgeon: Inda Castle, MD;  Location: [place];  Service: Endoscopy;;     reports that she has never smoked. She has never used smokeless tobacco. She reports that she does not drink alcohol or use drugs.  Allergies  Allergen Reactions  . Codeine Nausea And Vomiting    Family History  Problem Relation Age of Onset  . Coronary artery disease Mother 58       MI     Prior to Admission medications   Medication Sig Start Date End Date Taking? Authorizing Provider  acetaminophen (TYLENOL) 325 MG tablet Take 325-650 mg by mouth every 6 (six) hours as needed (for pain).   Yes [provider]  amLODipine (NORVASC) 10 MG tablet TAKE 1 TABLET(10 MG) BY MOUTH DAILY 06/02/17  Yes Hoyt Koch, MD  aspirin EC 81 MG tablet Take 1 tablet (81 mg total) by mouth daily. 03/19/17  Yes Almyra Deforest, PA  diclofenac sodium (VOLTAREN) 1 % GEL Apply 2 g topically 4 (four) times daily. Patient taking differently: Apply 2 g  topically See admin instructions. Apply 2 grams to both knees for pain four times a day 05/08/17  Yes Hoyt Koch, MD  lisinopril (PRINIVIL,ZESTRIL) 40 MG tablet TAKE 1 TABLET(40 MG) BY MOUTH DAILY 05/06/17  Yes Hoyt Koch, MD  metoprolol tartrate (LOPRESSOR) 50 MG tablet Take 1 tablet (50 mg total) by mouth 2 (two) times daily. 03/19/17  Yes Meng, Isaac Laud, PA  pantoprazole (PROTONIX) 40 MG tablet TAKE 1 TABLET BY MOUTH EVERY DAY. KEEP APPOINTMENT FOR REFILLS Patient taking  differently: Take 40 mg by mouth once a day 09/26/17  Yes Hoyt Koch, MD  travoprost, benzalkonium, (TRAVATAN) 0.004 % ophthalmic solution Place 1 drop into both eyes at bedtime. 08/19/14  Yes Angiulli, Lavon Paganini, PA-C  triamcinolone cream (KENALOG) 0.1 % Apply 1 application topically 2 (two) times daily. 08/20/17  Yes Hoyt Koch, MD  lidocaine (LIDODERM) 5 % Place 1 patch onto the skin daily. Remove & Discard patch within 12 hours or as directed by MD Patient not taking: Reported on 10/28/2017 09/01/17   Hoyt Koch, MD    Physical Exam: Vitals:   10/28/17 2030 10/28/17 2045 10/28/17 2100 10/28/17 2130  BP: (!) 155/87 (!) 159/79 (!) 171/85 (!) 153/84  Pulse: (!) 131 81 81 78  Resp:      Temp:      TempSrc:      SpO2: 94% 93% 95% 94%      Constitutional: NAD, calm  Eyes: PERTLA, lids and conjunctivae normal ENMT: Mucous membranes are moist. Posterior pharynx clear of any exudate or lesions.   Neck: normal, supple, no masses, no thyromegaly Respiratory: clear to auscultation bilaterally, no wheezing, no crackles. Normal respiratory effort.    Cardiovascular: S1 & S2 heard, regular rate and rhythm. Trace pretibial edema bilaterally. Abdomen: No distension, no tenderness, soft. Bowel sounds active.  Musculoskeletal: no clubbing / cyanosis. No joint deformity upper and lower extremities.    Skin: no significant rashes, lesions, ulcers. Warm, dry, well-perfused. Neurologic: No facial asymmetry. Sensation to light touch intact. Moving all extremities.  Psychiatric:  Alert and oriented x 3. Pleasant, cooperative.     Labs on Admission: I have personally reviewed following labs and imaging studies  CBC: Recent Labs  Lab 10/28/17 1943  WBC 9.3  NEUTROABS 6.7  HGB 12.9  HCT 40.6  MCV 84.1  PLT 542   Basic Metabolic Panel: Recent Labs  Lab 10/28/17 1943  NA 142  K 4.4  CL 107  CO2 24  GLUCOSE 104*  BUN 15  CREATININE 0.84  CALCIUM 9.2    GFR: CrCl cannot be calculated (Unknown ideal weight.). Liver Function Tests: No results for input(s): AST, ALT, ALKPHOS, BILITOT, PROT, ALBUMIN in the last 168 hours. No results for input(s): LIPASE, AMYLASE in the last 168 hours. No results for input(s): AMMONIA in the last 168 hours. Coagulation Profile: No results for input(s): INR, PROTIME in the last 168 hours. Cardiac Enzymes: No results for input(s): CKTOTAL, CKMB, CKMBINDEX, TROPONINI in the last 168 hours. BNP (last 3 results) No results for input(s): PROBNP in the last 8760 hours. HbA1C: No results for input(s): HGBA1C in the last 72 hours. CBG: No results for input(s): GLUCAP in the last 168 hours. Lipid Profile: No results for input(s): CHOL, HDL, LDLCALC, TRIG, CHOLHDL, LDLDIRECT in the last 72 hours. Thyroid Function Tests: No results for input(s): TSH, T4TOTAL, FREET4, T3FREE, THYROIDAB in the last 72 hours. Anemia Panel: No results for input(s): VITAMINB12, FOLATE, FERRITIN, TIBC, IRON, RETICCTPCT  in the last 72 hours. Urine analysis:    Component Value Date/Time   COLORURINE YELLOW 10/28/2017 2115   APPEARANCEUR CLEAR 10/28/2017 2115   LABSPEC 1.012 10/28/2017 2115   PHURINE 6.0 10/28/2017 2115   GLUCOSEU NEGATIVE 10/28/2017 2115   GLUCOSEU NEGATIVE 12/05/2016 1533   HGBUR SMALL (A) 10/28/2017 2115   BILIRUBINUR NEGATIVE 10/28/2017 2115   KETONESUR 5 (A) 10/28/2017 2115   PROTEINUR NEGATIVE 10/28/2017 2115   UROBILINOGEN 1.0 12/05/2016 1533   NITRITE NEGATIVE 10/28/2017 2115   LEUKOCYTESUR NEGATIVE 10/28/2017 2115   Sepsis Labs: @LABRCNTIP (procalcitonin:4,lacticidven:4) )No results found for this or any previous visit (from the past 240 hour(s)).   Radiological Exams on Admission: Ct Hip Right Wo Contrast  Result Date: 10/28/2017 CLINICAL DATA:  Fall yesterday.  Hip pain EXAM: CT OF THE RIGHT HIP WITHOUT CONTRAST TECHNIQUE: Multidetector CT imaging of the right hip was performed according to the  standard protocol. Multiplanar CT image reconstructions were also generated. COMPARISON:  10/27/2017 FINDINGS: Bones/Joint/Cartilage Slightly displaced fracture inferior pubic ramus on the right. No other pelvic fracture. No fracture of the femur. Mild degenerative change in the right hip with joint space narrowing and acetabular spurring Mixed sclerotic and lytic bony abnormality through the right throughout the right hemipelvis consistent with Paget's disease as noted on x-ray from yesterday. Ligaments Suboptimally assessed by CT. Muscles and Tendons Negative Soft tissues Atherosclerotic calcification.  No soft tissue mass IMPRESSION: Slightly displaced fracture inferior pubic ramus on the right. Negative for femur fracture. Paget's disease right hemipelvis Degenerative change in the right hip joint. Electronically Signed   By: Franchot Gallo M.D.   On: 10/28/2017 18:31   Dg Knee Complete 4 Views Left  Result Date: 10/27/2017 CLINICAL DATA:  Pain following falls EXAM: LEFT KNEE - COMPLETE 4+ VIEW COMPARISON:  None. FINDINGS: Frontal, lateral, and bilateral oblique views were obtained. Bones are osteoporotic. There is no fracture or dislocation. There is no appreciable joint effusion. There is moderate generalized joint space narrowing. There is spurring in all compartments. There is extensive chondrocalcinosis. There is calcification in the popliteal artery and trifurcation artery regions. IMPRESSION: Bones osteoporotic. No fracture or dislocation. No joint effusion. There is generalized osteoarthritic change. Chondrocalcinosis present, a finding that may be associated with osteoarthritis or calcium pyrophosphate deposition disease. Foci of arterial atherosclerosis noted. Electronically Signed   By: Lowella Grip III M.D.   On: 10/27/2017 09:51   Dg Hips Bilat W Or Wo Pelvis 3-4 Views  Addendum Date: 10/27/2017   ADDENDUM REPORT: 10/27/2017 09:53 ADDENDUM: Study compared to pelvis radiograph December 27, 2005 Electronically Signed   By: Lowella Grip III M.D.   On: 10/27/2017 09:53   Result Date: 10/27/2017 CLINICAL DATA:  Pain following fall EXAM: DG HIP (WITH OR WITHOUT PELVIS) 3-4V BILAT COMPARISON:  None. FINDINGS: Frontal pelvis as well as frontal and lateral views of each hip-total five views-obtained. There is no acute fracture or dislocation. There is moderate symmetric narrowing of both hip joints. There is Paget's disease throughout the right iliac bone including the right acetabulum as well as the right ischium. Bones are diffusely osteoporotic. There is degenerative change in visualized lumbar spine. IMPRESSION: No acute fracture or dislocation. Paget's disease throughout the right iliac bone and right ischium. Bones diffusely osteoporotic. Moderate symmetric narrowing both hip joints. Electronically Signed: By: Lowella Grip III M.D. On: 10/27/2017 09:49    EKG: Not performed.   Assessment/Plan   1. Pubic ramus fracture  - Presents with pain  in right groin and bilateral knees, unable to walk or even stand secondary to this  - CT reveals slightly displaced fracture of right inferior pubic ramus  - Orthopedic surgery consulting and much appreciated  - Plan for PT eval and tx, supportive care with prn analgesia    2. Hypertension  - BP at goal  - Continue Norvasc, lisinopril, and Lopressor     DVT prophylaxis: Lovenox Code Status: DNR  Family Communication: Son and daughter updated at bedside Consults called: Ortho  Admission status: Inpatient    Vianne Bulls, MD [facility] Pager [phone]  If 7PM-7AM, please contact night-coverage [email or web address] Password [facility]  10/28/2017, 10:21 PM

## 2017-10-28 NOTE — Telephone Encounter (Signed)
Pt.'s daughter reports pt. Fell at home yesterday and went to ED. States had "x-rays and nothing is broken, but she sore and getting around worse than she was yesterday." "I think she needs rehab." Offered appointment for tomorrow morning with her provider. Daughter states "I think my brother and I are going to take her back to the ED - we just can't take care of her at home right now. We have to work and she can't be alone."  Reason for Disposition . [1] MODERATE pain (e.g., interferes with normal activities, limping) AND [2] present > 3 days  Answer Assessment - Initial Assessment Questions 1. ONSET: "When did the pain start?"      Fell yesterday morning 2. LOCATION: "Where is the pain located?"      Both legs and her back . Left side is worse 3. PAIN: "How bad is the pain?"    (Scale 1-10; or mild, moderate, severe)   -  MILD (1-3): doesn't interfere with normal activities    -  MODERATE (4-7): interferes with normal activities (e.g., work or school) or awakens from sleep, limping    -  SEVERE (8-10): excruciating pain, unable to do any normal activities, unable to walk     10 4. WORK OR EXERCISE: "Has there been any recent work or exercise that involved this part of the body?"      No 5. CAUSE: "What do you think is causing the leg pain?"     Fell yesterday 6. OTHER SYMPTOMS: "Do you have any other symptoms?" (e.g., chest pain, back pain, breathing difficulty, swelling, rash, fever, numbness, weakness)     Very weak 7. PREGNANCY: "Is there any chance you are pregnant?" "When was your last menstrual period?"     No  Protocols used: LEG PAIN-A-AH

## 2017-10-28 NOTE — ED Notes (Signed)
EDMD at bedside

## 2017-10-28 NOTE — Progress Notes (Signed)
Asked to reviewed imaging on patient with hip/groin pain. CT hip shows minimally displaced inferior pubic rami fracture. No apparent fracture of femoral neck or intertrochanteric region. Recommend WBAT and mobilization with PT. Formal consult to follow in AM.  Shona Needles, MD [facility] [phone] (phone)

## 2017-10-29 ENCOUNTER — Other Ambulatory Visit: Payer: Self-pay

## 2017-10-29 ENCOUNTER — Telehealth: Payer: Self-pay

## 2017-10-29 NOTE — Telephone Encounter (Signed)
FYI Patient's family just wanted to inform us that patient is currently in the hospital due to a cracked pelvis.

## 2017-10-29 NOTE — Telephone Encounter (Signed)
Copied from [place] [phone]. Topic: Inquiry >> Oct 28, 2017  3:29 PM Vernona Rieger wrote: Reason for CRM: Patient's son called Legrand Como ) see previous Nurse Triage Encounter. He would like a call back @ [phone] on his sister's phone [name] ) they are currently at the hospital emergency room now with her again.

## 2017-10-29 NOTE — Evaluation (Signed)
Physical Therapy Evaluation [name] MRN: [id number] DOB: 1922/05/09 Today's Date: 10/29/2017   History of Present Illness  Pt is a 84 y/o female admitted secondary to R inferior pubic rami fx secondary to sustaining a fall at home. PMH including but not limited to cancer, glaucoma and HTN.    Clinical Impression  Pt presented supine in bed with HOB elevated, awake and willing to participate in therapy session. Pt's daughter present throughout session as well. Prior to admission, pt reported that she ambulated with use of RW and was independent with ADLs. Pt lives with her daughter and has assistance intermittently. Pt currently very limited secondary to weakness and R hip pain. Pt requires mod A for bed mobility and mod A x2 for transfers. Pt would continue to benefit from skilled physical therapy services at this time while admitted and after d/c to address the below listed limitations in order to improve overall safety and independence with functional mobility.     Follow Up Recommendations SNF;Supervision/Assistance - 24 hour    Equipment Recommendations  None recommended by PT    Recommendations for Other Services       Precautions / Restrictions Precautions Precautions: Fall Restrictions Weight Bearing Restrictions: Yes RLE Weight Bearing: Weight bearing as tolerated LLE Weight Bearing: Weight bearing as tolerated      Mobility  Bed Mobility Overal bed mobility: Needs Assistance Bed Mobility: Supine to Sit     Supine to sit: Mod assist     General bed mobility comments: increased time and effort, assistance with bilateral LE movement off of bed, use of bed rails, assistance to elevate trunk  Transfers Overall transfer level: Needs assistance Equipment used: Rolling walker (2 wheeled) Transfers: Sit to/from [facility] Sit to Stand: Mod assist;+2 physical assistance;From elevated surface Stand pivot transfers: Mod  assist;+2 safety/equipment;+2 physical assistance       General transfer comment: increased time and effort, cueing for safety hand placement, verbal and tactile cues to sequencing of LE movement during pivot to chair  Ambulation/Gait             General Gait Details: unable  Stairs            Wheelchair Mobility    Modified Rankin (Stroke Patients Only)       Balance Overall balance assessment: Needs assistance Sitting-balance support: Feet supported;Bilateral upper extremity supported Sitting balance-Leahy Scale: Poor Sitting balance - Comments: close min guard for safety   Standing balance support: During functional activity;Bilateral upper extremity supported Standing balance-Leahy Scale: Poor Standing balance comment: reliant on bilateral UEs on RW and external assistance                             Pertinent Vitals/Pain Pain Assessment: Faces Faces Pain Scale: Hurts little more Pain Location: R hip Pain Descriptors / Indicators: Sore;Guarding Pain Intervention(s): Monitored during session;Repositioned    Home Living Family/patient expects to be discharged to:: Private residence Living Arrangements: Children Available Help at Discharge: Family;Available PRN/intermittently Type of Home: House Home Access: Stairs to enter   [employer] of Steps: 2 Home Layout: One level Home Equipment: Walker - 2 wheels      Prior Function Level of Independence: Independent with assistive device(s)         Comments: pt ambulates with use of RW     Hand Dominance        Extremity/Trunk Assessment   Upper Extremity  Assessment Upper Extremity Assessment: Generalized weakness    Lower Extremity Assessment Lower Extremity Assessment: Generalized weakness;RLE deficits/detail    Cervical / Trunk Assessment Cervical / Trunk Assessment: Kyphotic  Communication   Communication: No difficulties  Cognition Arousal/Alertness:  Awake/alert Behavior During Therapy: WFL for tasks assessed/performed Overall Cognitive Status: Impaired/Different from baseline Area of Impairment: Following commands;Safety/judgement;Problem solving                       Following Commands: Follows one step commands consistently Safety/Judgement: Decreased awareness of deficits;Decreased awareness of safety   Problem Solving: Slow processing;Difficulty sequencing;Requires verbal cues        General Comments      Exercises     Assessment/Plan    PT Assessment Patient needs continued PT services  PT Problem List Decreased strength;Decreased range of motion;Decreased activity tolerance;Decreased mobility;Decreased coordination;Decreased balance;Decreased cognition;Decreased knowledge of use of DME;Decreased safety awareness;Decreased knowledge of precautions;Pain       PT Treatment Interventions DME instruction;Gait training;Stair training;Functional mobility training;Therapeutic exercise;Balance training;Therapeutic activities;Neuromuscular re-education;Patient/family education    PT Goals (Current goals can be found in the Care Plan section)  Acute Rehab PT Goals Patient Stated Goal: decrease pain PT Goal Formulation: With patient/family Time For Goal Achievement: 11/12/17 Potential to Achieve Goals: Fair    Frequency Min 2X/week   Barriers to discharge        Co-evaluation               AM-PAC PT "6 Clicks" Daily Activity  Outcome Measure Difficulty turning over in bed (including adjusting bedclothes, sheets and blankets)?: Unable Difficulty moving from lying on back to sitting on the side of the bed? : Unable Difficulty sitting down on and standing up from a chair with arms (e.g., wheelchair, bedside commode, etc,.)?: Unable Help needed moving to and from a bed to chair (including a wheelchair)?: A Lot Help needed walking in hospital room?: Total Help needed climbing 3-5 steps with a railing? :  Total 6 Click Score: 7    End of Session Equipment Utilized During Treatment: Gait belt Activity Tolerance: Patient limited by pain;Patient limited by fatigue Patient left: in chair;with call bell/phone within reach;with chair alarm set;with family/visitor present Nurse Communication: Mobility status;Need for lift equipment PT Visit Diagnosis: Other abnormalities of gait and mobility (R26.89);Pain Pain - Right/Left: Right Pain - part of body: Hip    Time: 6734-1937 PT Time Calculation (min) (ACUTE ONLY): 17 min   Charges:   PT Evaluation $PT Eval Moderate Complexity: 1 Mod     PT G Codes:        [place], PT, DPT [place] 10/29/2017, 12:24 PM

## 2017-10-29 NOTE — Progress Notes (Addendum)
PROGRESS NOTE  [name] XKG:[id number] DOB: 01-22-1922 DOA: 10/28/2017 PCP: Hoyt Koch, MD  HPI  [name] is a 84 y.o. year old female with medical history significant for left breast cancer status post lumpectomy in 2011/left mastectomy 2012, GI bleed (NSAIDs) 2014, hypertension and SVT who presented on 10/28/2017 with several days with significant pain upon ambulation after fall 2 days prior to admission and was found to have minimally displaced pubic rami fracture on the right found a CT scan.  For pain control and mobilization with therapy  Interval History No acute events overnight   ROS:    Subjective Reports pain well controlled.  Curiously when she will be able to go home.  Daughter at bedside  Assessment/Plan: Principal Problem:   Pubic ramus fracture ([place]) Active Problems:   Hypertension   Pubic ramus fracture, right, closed, initial encounter ([place])   1. Pubic ramus fracture, stable.  Related to unwitnessed fall 2 days prior to admission.  Previous imaging on pelvis and right hip x-rays show no visible fracture or dislocation.  Slightly displaced fracture right inferior pubic ramus on CT right hip.  Ortho recommends nonoperative management, may need MRI if significant pain continues to evaluate for intertrochanteric fracture.  PT recommends SNF versus 24-hour assistance.Norco pain control 2. Hypertension, stable.  Continue home Norvasc, lisinopril, Lopressor. Code Status: FULL   Family Communication: Daughter at bedside  Disposition Plan: We will discuss with PT SNF versus 24-hour assistance.  Daughter seems amenable 24-hour assistance but will confirm   Consultants:  Ortho  Procedures:  None  Antimicrobials:  None  Cultures:  None  Telemetry: No  DVT prophylaxis:  Lovenox   Objective: Vitals:   10/28/17 2230 10/28/17 2245 10/29/17 0000 10/29/17 1219  BP: (!) 148/104 (!) 154/81 (!) 144/93 (!) 136/91  Pulse: (!) 134 81  (!) 133 66  Resp:   16 16  Temp:   98.7 F (37.1 C)   TempSrc:   Oral   SpO2: 97% 93% 98% 97%  Weight:   73.5 kg (162 lb 0.6 oz)   Height:   5\' 6"  (1.676 m)     Intake/Output Summary (Last 24 hours) at 10/29/2017 1559 Last data filed at 10/29/2017 1300 Gross per 24 hour  Intake 240 ml  Output -  Net 240 ml   Filed Weights   10/29/17 0000  Weight: 73.5 kg (162 lb 0.6 oz)    Exam:  Constitutional:normal appearing female Eyes: EOMI, anicteric, normal conjunctivae ENMT: Oropharynx with moist mucous membranes, normal dentition Cardiovascular: RRR no MRGs, with no peripheral edema Respiratory: Normal respiratory effort, clear breath sounds  Abdomen: Soft,non-tender, with no HSM Skin: No rash ulcers, or lesions. Without skin tenting  Neurologic: Grossly no focal neuro deficit. Difficulty lifting right leg due to pain Psychiatric:Appropriate affect, and mood. Mental status AAOx3  Data Reviewed: CBC: Recent Labs  Lab 10/28/17 1943  WBC 9.3  NEUTROABS 6.7  HGB 12.9  HCT 40.6  MCV 84.1  PLT 702   Basic Metabolic Panel: Recent Labs  Lab 10/28/17 1943  NA 142  K 4.4  CL 107  CO2 24  GLUCOSE 104*  BUN 15  CREATININE 0.84  CALCIUM 9.2   GFR: Estimated Creatinine Clearance: 42.9 mL/min (by C-G formula based on SCr of 0.84 mg/dL). Liver Function Tests: No results for input(s): AST, ALT, ALKPHOS, BILITOT, PROT, ALBUMIN in the last 168 hours. No results for input(s): LIPASE, AMYLASE in the last 168 hours. No results for  input(s): AMMONIA in the last 168 hours. Coagulation Profile: No results for input(s): INR, PROTIME in the last 168 hours. Cardiac Enzymes: No results for input(s): CKTOTAL, CKMB, CKMBINDEX, TROPONINI in the last 168 hours. BNP (last 3 results) No results for input(s): PROBNP in the last 8760 hours. HbA1C: No results for input(s): HGBA1C in the last 72 hours. CBG: No results for input(s): GLUCAP in the last 168 hours. Lipid Profile: No results  for input(s): CHOL, HDL, LDLCALC, TRIG, CHOLHDL, LDLDIRECT in the last 72 hours. Thyroid Function Tests: No results for input(s): TSH, T4TOTAL, FREET4, T3FREE, THYROIDAB in the last 72 hours. Anemia Panel: No results for input(s): VITAMINB12, FOLATE, FERRITIN, TIBC, IRON, RETICCTPCT in the last 72 hours. Urine analysis:    Component Value Date/Time   COLORURINE YELLOW 10/28/2017 2115   APPEARANCEUR CLEAR 10/28/2017 2115   LABSPEC 1.012 10/28/2017 2115   PHURINE 6.0 10/28/2017 2115   GLUCOSEU NEGATIVE 10/28/2017 2115   GLUCOSEU NEGATIVE 12/05/2016 1533   HGBUR SMALL (A) 10/28/2017 2115   BILIRUBINUR NEGATIVE 10/28/2017 2115   KETONESUR 5 (A) 10/28/2017 2115   PROTEINUR NEGATIVE 10/28/2017 2115   UROBILINOGEN 1.0 12/05/2016 1533   NITRITE NEGATIVE 10/28/2017 2115   LEUKOCYTESUR NEGATIVE 10/28/2017 2115   Sepsis Labs: @LABRCNTIP (procalcitonin:4,lacticidven:4)  )No results found for this or any previous visit (from the past 240 hour(s)).    Studies: Ct Hip Right Wo Contrast  Result Date: 10/28/2017 CLINICAL DATA:  Fall yesterday.  Hip pain EXAM: CT OF THE RIGHT HIP WITHOUT CONTRAST TECHNIQUE: Multidetector CT imaging of the right hip was performed according to the standard protocol. Multiplanar CT image reconstructions were also generated. COMPARISON:  10/27/2017 FINDINGS: Bones/Joint/Cartilage Slightly displaced fracture inferior pubic ramus on the right. No other pelvic fracture. No fracture of the femur. Mild degenerative change in the right hip with joint space narrowing and acetabular spurring Mixed sclerotic and lytic bony abnormality through the right throughout the right hemipelvis consistent with Paget's disease as noted on x-ray from yesterday. Ligaments Suboptimally assessed by CT. Muscles and Tendons Negative Soft tissues Atherosclerotic calcification.  No soft tissue mass IMPRESSION: Slightly displaced fracture inferior pubic ramus on the right. Negative for femur fracture.  Paget's disease right hemipelvis Degenerative change in the right hip joint. Electronically Signed   By: Franchot Gallo M.D.   On: 10/28/2017 18:31    Scheduled Meds: . amLODipine  10 mg Oral Daily  . aspirin EC  81 mg Oral Daily  . diclofenac sodium  2 g Topical QID  . enoxaparin (LOVENOX) injection  40 mg Subcutaneous Q24H  . lisinopril  40 mg Oral Daily  . metoprolol tartrate  50 mg Oral BID  . pantoprazole  40 mg Oral Daily  . sodium chloride flush  3 mL Intravenous Q12H  . Travoprost (BAK Free)  1 drop Both Eyes QHS    Continuous Infusions: . sodium chloride       LOS: 1 day     Desiree Hane, MD [facility] Pager [phone]  If 7PM-7AM, please contact night-coverage [email or web address] Password [facility] 10/29/2017, 3:59 PM

## 2017-10-29 NOTE — Plan of Care (Signed)
I spoke with Daughter Bemnet Trovato at [phone] by phone who reports her wishes for her mom to be FULL code. She was previously DNR based on question asked on admission. [place] [facility]

## 2017-10-29 NOTE — Consult Note (Signed)
Orthopaedic Trauma Service (OTS) Consult   Patient ID: [name] MRN: [id number] DOB/AGE: 03-Sep-1921 84 y.o.  Reason for Consult: Right groin pain Referring Physician: Dr. Mitzi Hansen, MD  HPI: [name] is an 84 y.o. female who is being seen in consultation at the request of Dr. Myna Hidalgo for evaluation of groin pain.  The patient has had difficulty ambulating after a fall 2 days prior to admission.  She was seen in the emergency department and had plain films that were negative of the pelvis hip and knee.  Since leaving the emergency department she was unable to walk.  She continues to complain about pain in both hips.  The patient usually ambulates without a cane or walker.  I evaluated the patient at bedside this morning no family was available.  CT scan was obtained after she returned to the emergency room which showed a minimally displaced inferior pubic rami fracture on the right.  No other fractures of the hip were seen.  She was admitted for pain control and mobilization with therapy.  Currently complains of bilateral groin pain.  Pain with any motion.  She is not able to stand up or walk.  She states that her pain is better this morning.  Past Medical History:  Diagnosis Date  . Arthritis   . Cancer [employer]) 2011, 2012   left breast cancer 2011 central excision,2012 mastectomy  . Complication of anesthesia    shortness of breath after procedure  . Glaucoma   . Hypertension   . Protein-calorie malnutrition, severe ([place])   . SVT (supraventricular tachycardia) ([place])   . Upper GI bleed 01/2013.    Melena. HPylori negative gastritis on EGD.     Past Surgical History:  Procedure Laterality Date  . ABDOMINAL HYSTERECTOMY    . BREAST SURGERY  2011  . BREAST SURGERY  2012   lt breast masty  . CHOLECYSTECTOMY    . ESOPHAGOGASTRODUODENOSCOPY N/A 01/25/2013   Procedure: ESOPHAGOGASTRODUODENOSCOPY (EGD);  Surgeon: Jerene Bears, MD;  Location: [facility];  Service:  Gastroenterology;  Laterality: N/A;  . ESOPHAGOGASTRODUODENOSCOPY (EGD) WITH PROPOFOL  08/04/2014   Procedure: ESOPHAGOGASTRODUODENOSCOPY (EGD) WITH PROPOFOL;  Surgeon: Inda Castle, MD;  Location: [facility] ENDOSCOPY;  Service: Endoscopy;;    Family History  Problem Relation Age of Onset  . Coronary artery disease Mother 71       MI    Social History:  reports that she has never smoked. She has never used smokeless tobacco. She reports that she does not drink alcohol or use drugs.  Allergies:  Allergies  Allergen Reactions  . Codeine Nausea And Vomiting    Medications:  No current facility-administered medications on file prior to encounter.    Current Outpatient Medications on File Prior to Encounter  Medication Sig Dispense Refill  . acetaminophen (TYLENOL) 325 MG tablet Take 325-650 mg by mouth every 6 (six) hours as needed (for pain).    [name] amLODipine (NORVASC) 10 MG tablet TAKE 1 TABLET(10 MG) BY MOUTH DAILY 90 tablet 3  . aspirin EC 81 MG tablet Take 1 tablet (81 mg total) by mouth daily.    . diclofenac sodium (VOLTAREN) 1 % GEL Apply 2 g topically 4 (four) times daily. (Patient taking differently: Apply 2 g topically See admin instructions. Apply 2 grams to both knees for pain four times a day) 100 g 6  . lisinopril (PRINIVIL,ZESTRIL) 40 MG tablet TAKE 1 TABLET(40 MG) BY MOUTH DAILY 90 tablet 1  . metoprolol tartrate (  LOPRESSOR) 50 MG tablet Take 1 tablet (50 mg total) by mouth 2 (two) times daily. 180 tablet 3  . pantoprazole (PROTONIX) 40 MG tablet TAKE 1 TABLET BY MOUTH EVERY DAY. KEEP APPOINTMENT FOR REFILLS (Patient taking differently: Take 40 mg by mouth once a day) 30 tablet 2  . travoprost, benzalkonium, (TRAVATAN) 0.004 % ophthalmic solution Place 1 drop into both eyes at bedtime. 2.5 mL 12  . triamcinolone cream (KENALOG) 0.1 % Apply 1 application topically 2 (two) times daily. 30 g 2  . lidocaine (LIDODERM) 5 % Place 1 patch onto the skin daily. Remove & Discard patch  within 12 hours or as directed by MD (Patient not taking: Reported on 10/28/2017) 30 patch 0    ROS: Constitutional: No fever or chills Vision: No changes in vision ENT: No difficulty swallowing CV: No chest pain Pulm: No SOB or wheezing GI: No nausea or vomiting GU: No urgency or inability to hold urine Skin: No poor wound healing Neurologic: No numbness or tingling Psychiatric: No depression or anxiety Heme: No bruising Allergic: No reaction to medications or food   Exam: Blood pressure (!) 144/93, pulse (!) 133, temperature 98.7 F (37.1 C), temperature source Oral, resp. rate 16, height 5\' 6"  (1.676 m), weight 73.5 kg (162 lb 0.6 oz), SpO2 98 %. General: No acute distress Orientation: Awake and alert Mood and Affect: Cooperative Gait: Unable to assess Coordination and balance: Slow but within normal limits  Bilateral lower extremities: No leg length discrepancy or deformity noted.  Gentle logroll on each side is without any pain.  Some gentle passive hip flexion causes discomfort in both hips.  No deformity or injury about the knee.  She is neurovascular intact distally.   Medical Decision Making: Imaging: AP pelvis with views of the right hip from 2 days prior are reviewed which show no visible fracture or dislocation.  CT scan of the right hip is reviewed which shows a minimally displaced inferior rami fracture.  No visible fracture of the posterior sacrum or the superior pubic ramus.  I do not appreciate fracture lines through the intertrochanteric region or femoral neck as well.  Labs:  CBC    Component Value Date/Time   WBC 9.3 10/28/2017 1943   RBC 4.83 10/28/2017 1943   HGB 12.9 10/28/2017 1943   HGB 13.4 03/04/2012 1301   HCT 40.6 10/28/2017 1943   HCT 40.4 03/04/2012 1301   PLT 394 10/28/2017 1943   PLT 328 03/04/2012 1301   MCV 84.1 10/28/2017 1943   MCV 88.9 03/04/2012 1301   MCH 26.7 10/28/2017 1943   MCHC 31.8 10/28/2017 1943   RDW 15.1 10/28/2017 1943    RDW 13.6 03/04/2012 1301   LYMPHSABS 1.5 10/28/2017 1943   LYMPHSABS 1.8 03/04/2012 1301   MONOABS 0.7 10/28/2017 1943   MONOABS 0.5 03/04/2012 1301   EOSABS 0.4 10/28/2017 1943   EOSABS 0.2 03/04/2012 1301   BASOSABS 0.0 10/28/2017 1943   BASOSABS 0.0 03/04/2012 1301    Medical history and chart was reviewed  Assessment/Plan: 84 year old female with a history of breast cancer, hypertension with right inferior pubic rami fracture.  After reviewing the CT scan I do not feel that there is any surgical intervention warranted.  I feel that she can weight-bear as tolerated bilateral lower extremities.  If she continues to have a significant amount of pain it may be indicated to perform MRI to evaluate for a nondisplaced intertrochanteric fracture.  However at this time the CT scan shows  no signs of that.  I recommend continuing with pain management and physical therapy for mobilization.   Shona Needles, MD [facility] [phone] (phone)

## 2017-10-30 NOTE — Clinical Social Work Note (Signed)
Clinical Social Work Assessment  Patient Details  Name: [name] MRN: [id number] Date of Birth: 02/12/1922  Date of referral:  10/30/17               Reason for consult:  Facility Placement                Permission sought to share information with:  Chartered certified accountant granted to share information::  Yes, Verbal Permission Granted  Name::     Donnel Saxon  Agency::  SNF  Relationship::  daughter  Contact Information:     Housing/Transportation Living arrangements for the past 2 months:  Single Family Home Source of Information:  Patient Patient Interpreter Needed:  None Criminal Activity/Legal Involvement Pertinent to Current Situation/Hospitalization:  No - Comment as needed Significant Relationships:  Adult Children, Other Family Members Lives with:  Adult Children Do you feel safe going back to the place where you live?  No Need for family participation in patient care:  Yes (Comment)  Care giving concerns:  Pt from home with family and will need SNF at discharge.  Social Worker assessment / plan:  CSW met with patient at bedside and she indicated that resides at home with family. She indicated that she used a cane and walker at home. She indicated that she completed ADL's as best as she can with some family support. Pt gave permission to contact daughter [name] about disposition. Pt agreed to SNF at discharge and will defer to daughter.  CSW obtained permission to send out SNF referrals.  CSW called daughter and left voicemail.   CSW will continue with disposition.  Employment status:  Retired Forensic scientist:  Medicare PT Recommendations:  [place] / Referral to community resources:  [place]  Patient/Family's Response to care:  Patient thanked CSW for meeting to discuss SNF's.       Patient/Family's Understanding of and Emotional Response to Diagnosis, Current Treatment, and Prognosis:  Patient  has good understanding of need for SNF and is in agreement with the recommendation. Pt indicated that she has been in SNF before. Pt will complete rehabilitative therapies and then return home to support of family. CSW will f/u for disposition.  Emotional Assessment Appearance:  Appears stated age Attitude/Demeanor/Rapport:  (Cooperative) Affect (typically observed):  Accepting, Appropriate Orientation:  Oriented to Situation, Oriented to Place, Oriented to Self Alcohol / Substance use:  Not Applicable Psych involvement (Current and /or in the community):  No (Comment)  Discharge Needs  Concerns to be addressed:  Discharge Planning Concerns Readmission within the last 30 days:  No Current discharge risk:  Dependent with Mobility, Physical Impairment Barriers to Discharge:  No Barriers Identified, Continued Medical Work up   [employer], LCSW 10/30/2017, 1:52 PM

## 2017-10-30 NOTE — Progress Notes (Signed)
PROGRESS NOTE  [name] GLO:[id number] DOB: 06/25/1921 DOA: 10/28/2017 PCP: Hoyt Koch, MD  HPI  [name] is a 84 y.o. year old female with medical history significant for left breast cancer status post lumpectomy in 2011/left mastectomy 2012, GI bleed (NSAIDs) 2014, hypertension and SVT who presented on 10/28/2017 with several days with significant pain upon ambulation after fall 2 days prior to admission and was found to have minimally displaced pubic rami fracture on the right found a CT scan.  Admitted for pain control and mobilization with therapy.  Interval History No acute events overnight   ROS:    Subjective Worked with PT on yesterday and did ok. Eating well.   Assessment/Plan: Principal Problem:   Pubic ramus fracture ([facility]) Active Problems:   Hypertension   Pubic ramus fracture, right, closed, initial encounter ([place])   1. Pubic ramus fracture, stable.  Related to unwitnessed fall 2 days prior to admission.  Previous imaging on pelvis and right hip x-rays show no visible fracture or dislocation.  Slightly displaced fracture right inferior pubic ramus on CT right hip.  Ortho recommends nonoperative management with pain control and physical therapy for mobilization. No need for MRI as  pain is controlled.  PT recommends SNF versus 24-hour assistance.Norco pain control  2. Hypertension, at goal.  Continue home Norvasc, lisinopril, Lopressor.  Code Status: FULL   Family Communication: Will update family by phone  Disposition Plan: PT recommends SNF, S/W discussing with daughter as patient agrees  Consultants:  Ortho  Procedures:  None  Antimicrobials:  None  Cultures:  None  Telemetry: No  DVT prophylaxis:  Lovenox   Objective: Vitals:   10/29/17 1219 10/29/17 2129 10/30/17 0350 10/30/17 1146  BP: (!) 136/91 (!) 155/66 136/73 (!) 147/88  Pulse: 66 64 71 83  Resp: 16 16  17   Temp:  97.7 F (36.5 C) 97.6 F (36.4 C) 98 F  (36.7 C)  TempSrc:  Oral Oral Oral  SpO2: 97% 96% 97% 100%  Weight:      Height:        Intake/Output Summary (Last 24 hours) at 10/30/2017 1438 Last data filed at 10/30/2017 1200 Gross per 24 hour  Intake 240 ml  Output 500 ml  Net -260 ml   Filed Weights   10/29/17 0000  Weight: 73.5 kg (162 lb 0.6 oz)    Exam:  Constitutional:normal appearing elderlyfemale Eyes: EOMI, anicteric, normal conjunctivae ENMT: Oropharynx with moist mucous membranes, normal dentition Cardiovascular: RRR no MRGs, with no peripheral edema Respiratory: Normal respiratory effort, clear breath sounds  Skin: No rash ulcers, or lesions. Without skin tenting  Neurologic: Grossly no focal neuro deficit. Difficulty lifting right leg due to pain Psychiatric:Appropriate affect, and mood. Mental status alert, oriented to place, self, context  Data Reviewed: CBC: Recent Labs  Lab 10/28/17 1943  WBC 9.3  NEUTROABS 6.7  HGB 12.9  HCT 40.6  MCV 84.1  PLT 188   Basic Metabolic Panel: Recent Labs  Lab 10/28/17 1943  NA 142  K 4.4  CL 107  CO2 24  GLUCOSE 104*  BUN 15  CREATININE 0.84  CALCIUM 9.2   GFR: Estimated Creatinine Clearance: 42.9 mL/min (by C-G formula based on SCr of 0.84 mg/dL). Liver Function Tests: No results for input(s): AST, ALT, ALKPHOS, BILITOT, PROT, ALBUMIN in the last 168 hours. No results for input(s): LIPASE, AMYLASE in the last 168 hours. No results for input(s): AMMONIA in the last 168 hours. Coagulation Profile:  No results for input(s): INR, PROTIME in the last 168 hours. Cardiac Enzymes: No results for input(s): CKTOTAL, CKMB, CKMBINDEX, TROPONINI in the last 168 hours. BNP (last 3 results) No results for input(s): PROBNP in the last 8760 hours. HbA1C: No results for input(s): HGBA1C in the last 72 hours. CBG: No results for input(s): GLUCAP in the last 168 hours. Lipid Profile: No results for input(s): CHOL, HDL, LDLCALC, TRIG, CHOLHDL, LDLDIRECT in the  last 72 hours. Thyroid Function Tests: No results for input(s): TSH, T4TOTAL, FREET4, T3FREE, THYROIDAB in the last 72 hours. Anemia Panel: No results for input(s): VITAMINB12, FOLATE, FERRITIN, TIBC, IRON, RETICCTPCT in the last 72 hours. Urine analysis:    Component Value Date/Time   COLORURINE YELLOW 10/28/2017 2115   APPEARANCEUR CLEAR 10/28/2017 2115   LABSPEC 1.012 10/28/2017 2115   PHURINE 6.0 10/28/2017 2115   GLUCOSEU NEGATIVE 10/28/2017 2115   GLUCOSEU NEGATIVE 12/05/2016 1533   HGBUR SMALL (A) 10/28/2017 2115   BILIRUBINUR NEGATIVE 10/28/2017 2115   KETONESUR 5 (A) 10/28/2017 2115   PROTEINUR NEGATIVE 10/28/2017 2115   UROBILINOGEN 1.0 12/05/2016 1533   NITRITE NEGATIVE 10/28/2017 2115   LEUKOCYTESUR NEGATIVE 10/28/2017 2115   Sepsis Labs: @LABRCNTIP (procalcitonin:4,lacticidven:4)  )No results found for this or any previous visit (from the past 240 hour(s)).    Studies: No results found.  Scheduled Meds: . amLODipine  10 mg Oral Daily  . aspirin EC  81 mg Oral Daily  . diclofenac sodium  2 g Topical QID  . enoxaparin (LOVENOX) injection  40 mg Subcutaneous Q24H  . lisinopril  40 mg Oral Daily  . metoprolol tartrate  50 mg Oral BID  . pantoprazole  40 mg Oral Daily  . sodium chloride flush  3 mL Intravenous Q12H  . Travoprost (BAK Free)  1 drop Both Eyes QHS    Continuous Infusions: . sodium chloride       LOS: 2 days     Desiree Hane, MD [facility] Pager [phone]  If 7PM-7AM, please contact night-coverage [email or web address] Password [facility] 10/30/2017, 2:38 PM

## 2017-10-30 NOTE — NC FL2 (Addendum)
[place] LEVEL OF CARE SCREENING TOOL     IDENTIFICATION  Patient Name: [name] Birthdate: May 11, 1922 Sex: female Admission Date (Current Location): 10/28/2017  [facility] and [place] Number:  Herbalist and Address:  [facility] [place], [place] [address], [place], [place] [address]      Provider Number: [id number]  Attending Physician Name and Address:  Desiree Hane, MD  Relative Name and Phone Number:  Ariatna Jester; daughter; [phone]    Current Level of Care: Hospital Recommended Level of Care: [place] Prior Approval Number:    Date Approved/Denied:   PASRR Number: [id number]  Discharge Plan: SNF    Current Diagnoses: Patient Active Problem List   Diagnosis Date Noted  . Pubic ramus fracture ([place]) 10/28/2017  . Pubic ramus fracture, right, closed, initial encounter ([place]) 10/28/2017  . Aortic valve regurgitation 07/01/2017  . Fall 12/05/2016  . Debilitated 08/10/2014  . Physical deconditioning   . Protein-calorie malnutrition, severe ([place]) 02/24/2014  . SVT (supraventricular tachycardia) ([place]) 02/23/2014  . DJD (degenerative joint disease) 04/07/2013  . Breast cancer- s/p mastectomy 03/11/2013  . GERD 03/10/2008  . GLAUCOMA 09/30/2007  . Hypertension 09/30/2007    Orientation RESPIRATION BLADDER Height & Weight     Self, Situation, Place  Normal Incontinent, External catheter Weight: 162 lb 0.6 oz (73.5 kg) Height:  5\' 6"  (167.6 cm)  BEHAVIORAL SYMPTOMS/MOOD NEUROLOGICAL BOWEL NUTRITION STATUS      Continent Diet(see discharge summary)  AMBULATORY STATUS COMMUNICATION OF NEEDS Skin   Extensive Assist Verbally Normal                       Personal Care Assistance Level of Assistance  Bathing, Feeding, Dressing Bathing Assistance: Maximum assistance Feeding assistance: Limited assistance Dressing Assistance: Maximum assistance     Functional Limitations Info  Sight, Speech,  Hearing Sight Info: Adequate Hearing Info: Adequate Speech Info: Adequate    SPECIAL CARE FACTORS FREQUENCY  PT (By licensed PT), OT (By licensed OT)     PT Frequency: 5x week OT Frequency: 5x week            Contractures Contractures Info: Not present    Additional Factors Info  Code Status, Allergies Code Status Info: DNR Allergies Info: Codeine           Current Medications (10/30/2017):  This is the current hospital active medication list Current Facility-Administered Medications  Medication Dose Route Frequency Provider Last Rate Last Dose  . 0.9 %  sodium chloride infusion  250 mL Intravenous PRN Opyd, Ilene Qua, MD      . acetaminophen (TYLENOL) tablet 650 mg  650 mg Oral Q6H PRN Opyd, Ilene Qua, MD   650 mg at 10/29/17 2209   Or  . acetaminophen (TYLENOL) suppository 650 mg  650 mg Rectal Q6H PRN Opyd, Ilene Qua, MD      . amLODipine (NORVASC) tablet 10 mg  10 mg Oral Daily Opyd, Ilene Qua, MD   10 mg at 10/30/17 1149  . aspirin EC tablet 81 mg  81 mg Oral Daily Opyd, Ilene Qua, MD   81 mg at 10/30/17 1149  . bisacodyl (DULCOLAX) EC tablet 5 mg  5 mg Oral Daily PRN Opyd, Ilene Qua, MD   5 mg at 10/29/17 1014  . diclofenac sodium (VOLTAREN) 1 % transdermal gel 2 g  2 g Topical QID Opyd, Ilene Qua, MD   2 g at 10/30/17 1150  .  enoxaparin (LOVENOX) injection 40 mg  40 mg Subcutaneous Q24H Opyd, Ilene Qua, MD   40 mg at 10/29/17 1359  . HYDROcodone-acetaminophen (NORCO/VICODIN) 5-325 MG per tablet 1-2 tablet  1-2 tablet Oral Q4H PRN Opyd, Ilene Qua, MD   1 tablet at 10/30/17 0502  . lisinopril (PRINIVIL,ZESTRIL) tablet 40 mg  40 mg Oral Daily Opyd, Ilene Qua, MD   40 mg at 10/30/17 1149  . metoprolol tartrate (LOPRESSOR) tablet 50 mg  50 mg Oral BID Opyd, Ilene Qua, MD   50 mg at 10/30/17 1149  . ondansetron (ZOFRAN) tablet 4 mg  4 mg Oral Q6H PRN Opyd, Ilene Qua, MD       Or  . ondansetron (ZOFRAN) injection 4 mg  4 mg Intravenous Q6H PRN Opyd, Ilene Qua, MD      .  pantoprazole (PROTONIX) EC tablet 40 mg  40 mg Oral Daily Opyd, Ilene Qua, MD   40 mg at 10/30/17 1149  . senna-docusate (Senokot-S) tablet 1 tablet  1 tablet Oral QHS PRN Opyd, Ilene Qua, MD   1 tablet at 10/29/17 2208  . sodium chloride flush (NS) 0.9 % injection 3 mL  3 mL Intravenous Q12H Opyd, Ilene Qua, MD   3 mL at 10/30/17 1151  . sodium chloride flush (NS) 0.9 % injection 3 mL  3 mL Intravenous PRN Opyd, Ilene Qua, MD      . Travoprost (BAK Free) (TRAVATAN) 0.004 % ophthalmic solution SOLN 1 drop  1 drop Both Eyes QHS Opyd, Ilene Qua, MD   1 drop at 10/29/17 2209     Discharge Medications: Please see discharge summary for a list of discharge medications.  Relevant Imaging Results:  Relevant Lab Results:   Additional Information SS#[id number] ;minimally displaced pubic rami fracture no surgical intervention deemed appropriate.  Normajean Baxter, LCSW

## 2017-10-31 ENCOUNTER — Other Ambulatory Visit: Payer: Self-pay | Admitting: Internal Medicine

## 2017-10-31 DIAGNOSIS — Z853 Personal history of malignant neoplasm of breast: Secondary | ICD-10-CM | POA: Diagnosis not present

## 2017-10-31 DIAGNOSIS — S32591S Other specified fracture of right pubis, sequela: Secondary | ICD-10-CM | POA: Diagnosis not present

## 2017-10-31 DIAGNOSIS — M8888 Osteitis deformans of other bones: Secondary | ICD-10-CM | POA: Diagnosis not present

## 2017-10-31 DIAGNOSIS — I471 Supraventricular tachycardia: Secondary | ICD-10-CM | POA: Diagnosis not present

## 2017-10-31 DIAGNOSIS — M6281 Muscle weakness (generalized): Secondary | ICD-10-CM | POA: Diagnosis not present

## 2017-10-31 DIAGNOSIS — W19XXXD Unspecified fall, subsequent encounter: Secondary | ICD-10-CM | POA: Diagnosis not present

## 2017-10-31 DIAGNOSIS — S32591A Other specified fracture of right pubis, initial encounter for closed fracture: Secondary | ICD-10-CM | POA: Diagnosis not present

## 2017-10-31 DIAGNOSIS — M2550 Pain in unspecified joint: Secondary | ICD-10-CM | POA: Diagnosis not present

## 2017-10-31 DIAGNOSIS — K219 Gastro-esophageal reflux disease without esophagitis: Secondary | ICD-10-CM | POA: Diagnosis not present

## 2017-10-31 DIAGNOSIS — E440 Moderate protein-calorie malnutrition: Secondary | ICD-10-CM | POA: Diagnosis not present

## 2017-10-31 DIAGNOSIS — S32301D Unspecified fracture of right ilium, subsequent encounter for fracture with routine healing: Secondary | ICD-10-CM | POA: Diagnosis not present

## 2017-10-31 DIAGNOSIS — R2689 Other abnormalities of gait and mobility: Secondary | ICD-10-CM | POA: Diagnosis not present

## 2017-10-31 DIAGNOSIS — Z7401 Bed confinement status: Secondary | ICD-10-CM | POA: Diagnosis not present

## 2017-10-31 DIAGNOSIS — R296 Repeated falls: Secondary | ICD-10-CM | POA: Diagnosis not present

## 2017-10-31 DIAGNOSIS — M1990 Unspecified osteoarthritis, unspecified site: Secondary | ICD-10-CM | POA: Diagnosis not present

## 2017-10-31 DIAGNOSIS — I10 Essential (primary) hypertension: Secondary | ICD-10-CM | POA: Diagnosis not present

## 2017-10-31 DIAGNOSIS — H409 Unspecified glaucoma: Secondary | ICD-10-CM | POA: Diagnosis not present

## 2017-10-31 DIAGNOSIS — I351 Nonrheumatic aortic (valve) insufficiency: Secondary | ICD-10-CM | POA: Diagnosis not present

## 2017-10-31 DIAGNOSIS — R5381 Other malaise: Secondary | ICD-10-CM | POA: Diagnosis not present

## 2017-10-31 DIAGNOSIS — R609 Edema, unspecified: Secondary | ICD-10-CM | POA: Diagnosis not present

## 2017-10-31 MED ORDER — DICLOFENAC SODIUM 1 % TD GEL: 2.0000 g | TRANSDERMAL | Status: DC

## 2017-10-31 MED ORDER — SENNOSIDES-DOCUSATE SODIUM 8.6-50 MG PO TABS: 1.0000 | ORAL_TABLET | Freq: Every evening | ORAL | Status: DC | PRN

## 2017-10-31 MED ORDER — HYDROCODONE-ACETAMINOPHEN 5-325 MG PO TABS: 1.0000 | ORAL_TABLET | ORAL | 0 refills | Status: AC | PRN

## 2017-10-31 MED ORDER — LISINOPRIL 40 MG PO TABS: 40.0000 mg | ORAL_TABLET | Freq: Every day | ORAL | Status: DC

## 2017-10-31 MED ORDER — ASPIRIN EC 81 MG PO TBEC: 81.0000 mg | DELAYED_RELEASE_TABLET | Freq: Every day | ORAL | Status: DC

## 2017-10-31 MED ORDER — METOPROLOL TARTRATE 50 MG PO TABS: 50.0000 mg | ORAL_TABLET | Freq: Two times a day (BID) | ORAL | 3 refills | Status: DC

## 2017-10-31 MED ORDER — BISACODYL 5 MG PO TBEC: 5.0000 mg | DELAYED_RELEASE_TABLET | Freq: Every day | ORAL | 0 refills | Status: DC | PRN

## 2017-10-31 MED ORDER — PANTOPRAZOLE SODIUM 40 MG PO TBEC: DELAYED_RELEASE_TABLET | ORAL | 2 refills | Status: DC

## 2017-10-31 MED ORDER — AMLODIPINE BESYLATE 10 MG PO TABS: ORAL_TABLET | ORAL | 3 refills | Status: DC

## 2017-10-31 MED ORDER — ACETAMINOPHEN 325 MG PO TABS: 325.0000 mg | ORAL_TABLET | Freq: Four times a day (QID) | ORAL | Status: DC | PRN

## 2017-10-31 MED ORDER — TRAVOPROST 0.004 % OP SOLN: 1.0000 [drp] | Freq: Every day | OPHTHALMIC | 12 refills | Status: DC

## 2017-10-31 MED ORDER — TRIAMCINOLONE ACETONIDE 0.1 % EX CREA: 1.0000 "application " | TOPICAL_CREAM | Freq: Two times a day (BID) | CUTANEOUS | 2 refills | Status: DC

## 2017-10-31 NOTE — Social Work (Addendum)
CSW met with family at bedside and provided SNF list of bed offers.  CSW will continue to follow up disposition.  11:24 am CSW spoke with daughter and discuss SNF and she accepted bed at [facility]. CSW f/u with SNf to confirm bed offer. CSW left message.    11:38am CSW was advised that [facility] cannot offer a SNF bed as they are full. CSW then called daughter and she accepted SNF bed at [facility]. CSW then called [facility] and left message requesting a call back to confirm bed offer.  11:49am: SNF [facility] confirmed SNF bed offer. CSW called daughter back and advised of same.  CSW f/u for disposition.  Elissa Hefty, LCSW Clinical Social Worker [phone]

## 2017-10-31 NOTE — Progress Notes (Signed)
Physical Therapy Treatment [name] MRN: [id number] DOB: 04/27/1922 Today's Date: 10/31/2017    History of Present Illness Pt is a 84 y/o female admitted secondary to R inferior pubic rami fx secondary to sustaining a fall at home. PMH including but not limited to cancer, glaucoma and HTN.    PT Comments    Pt performed progression to gait training with RW and +2 assistance.  Cues for upper trunk control.  Pt slow and guarded with audible crepitus in B UEs.  Plan for d/c to SNF this pm.     Follow Up Recommendations  SNF;Supervision/Assistance - 24 hour     Equipment Recommendations  None recommended by PT    Recommendations for Other Services       Precautions / Restrictions Precautions Precautions: Fall Restrictions Weight Bearing Restrictions: Yes RLE Weight Bearing: Weight bearing as tolerated LLE Weight Bearing: Weight bearing as tolerated    Mobility  Bed Mobility Overal bed mobility: Needs Assistance Bed Mobility: Supine to Sit;Sit to Supine     Supine to sit: Mod assist;+2 for physical assistance Sit to supine: Min assist;+2 for physical assistance   General bed mobility comments: Cues for hand placement, assistance to elevate trunk into sitting and advance LEs to edge of bed.    Transfers Overall transfer level: Needs assistance Equipment used: Rolling walker (2 wheeled) Transfers: Sit to/from [facility] Sit to Stand: Mod assist;+2 physical assistance;From elevated surface         General transfer comment: Cues for hand placement to and from seated surface.  Ambulation/Gait Ambulation/Gait assistance: Mod assist;+2 physical assistance Ambulation Distance (Feet): 18 Feet Assistive device: Rolling walker (2 wheeled) Gait Pattern/deviations: Antalgic;Trunk flexed;Step-to pattern     General Gait Details: Attempted with HHA but unable to step, trialed RW and patient able to progress to gait training.  Cues  for upper trunk control and increasing stride length.     Stairs             Wheelchair Mobility    Modified Rankin (Stroke Patients Only)       Balance Overall balance assessment: Needs assistance Sitting-balance support: Feet supported;Bilateral upper extremity supported Sitting balance-Leahy Scale: Poor Sitting balance - Comments: close min guard for safety     Standing balance-Leahy Scale: Poor                              Cognition Arousal/Alertness: Awake/alert Behavior During Therapy: WFL for tasks assessed/performed Overall Cognitive Status: Impaired/Different from baseline Area of Impairment: Following commands;Safety/judgement;Problem solving                       Following Commands: Follows one step commands consistently Safety/Judgement: Decreased awareness of deficits;Decreased awareness of safety   Problem Solving: Slow processing;Difficulty sequencing;Requires verbal cues        Exercises      General Comments        Pertinent Vitals/Pain Pain Assessment: Faces Faces Pain Scale: Hurts even more Pain Location: R hip Pain Descriptors / Indicators: Sore;Guarding Pain Intervention(s): Monitored during session;Repositioned;Ice applied    Home Living                      Prior Function            PT Goals (current goals can now be found in the care plan section) Acute Rehab PT Goals Patient Stated  Goal: decrease pain Potential to Achieve Goals: Fair Progress towards PT goals: Progressing toward goals    Frequency    Min 2X/week      PT Plan Current plan remains appropriate    Co-evaluation              AM-PAC PT "6 Clicks" Daily Activity  Outcome Measure  Difficulty turning over in bed (including adjusting bedclothes, sheets and blankets)?: Unable Difficulty moving from lying on back to sitting on the side of the bed? : Unable Difficulty sitting down on and standing up from a chair with  arms (e.g., wheelchair, bedside commode, etc,.)?: Unable Help needed moving to and from a bed to chair (including a wheelchair)?: A Lot Help needed walking in hospital room?: Total Help needed climbing 3-5 steps with a railing? : Total 6 Click Score: 7    End of Session Equipment Utilized During Treatment: Gait belt Activity Tolerance: Patient limited by pain;Patient limited by fatigue Patient left: in chair;with call bell/phone within reach;with chair alarm set;with family/visitor present Nurse Communication: Mobility status;Need for lift equipment PT Visit Diagnosis: Other abnormalities of gait and mobility (R26.89);Pain Pain - Right/Left: Right Pain - part of body: Hip     Time: 1610-9604 PT Time Calculation (min) (ACUTE ONLY): 21 min  Charges:  $Gait Training: 8-22 mins                    G Codes:       Governor Rooks, PTA pager [phone]    Cristela Blue 10/31/2017, 2:35 PM

## 2017-10-31 NOTE — Progress Notes (Addendum)
Report called and given to nurse York Cerise.

## 2017-10-31 NOTE — Clinical Social Work Placement (Signed)
   CLINICAL SOCIAL WORK PLACEMENT  NOTE  Date:  10/31/2017  Patient Details  Name: [name] MRN: [id number] Date of Birth: 06/13/1921  Clinical Social Work is seeking post-discharge placement for this patient at the [place] level of care (*CSW will initial, date and re-position this form in  chart as items are completed):  Yes   Patient/family provided with [place] Work Department's list of facilities offering this level of care within the geographic area requested by the patient (or if unable, by the patient's family).  Yes   Patient/family informed of their freedom to choose among providers that offer the needed level of care, that participate in Medicare, Medicaid or managed care program needed by the patient, have an available bed and are willing to accept the patient.  Yes   Patient/family informed of [place]'s ownership interest in [facility] and [facility], as well as of the fact that they are under no obligation to receive care at these facilities.  PASRR submitted to EDS on       PASRR number received on       Existing PASRR number confirmed on 10/30/17     FL2 transmitted to all facilities in geographic area requested by pt/family on       FL2 transmitted to all facilities within larger geographic area on 10/30/17     Patient informed that his/her managed care company has contracts with or will negotiate with certain facilities, including the following:        Yes   Patient/family informed of bed offers received.  Patient chooses bed at [facility]     Physician recommends and patient chooses bed at      Patient to be transferred to [facility] on 10/31/17.  Patient to be transferred to facility by [facility]     Patient family notified on 10/31/17 of transfer.  Name of family member notified:  [name], daughter     PHYSICIAN       Additional Comment:     _______________________________________________ Normajean Baxter, LCSW 10/31/2017, 12:13 PM

## 2017-10-31 NOTE — Social Work (Signed)
Clinical Social Worker facilitated patient discharge including contacting patient family and facility to confirm patient discharge plans.  Clinical information faxed to facility and family agreeable with plan.    CSW arranged ambulance transport via [facility] to [facility].    RN to call [phone] to give report prior to discharge. Pt going to Room 127A.  Clinical Social Worker will sign off for now as social work intervention is no longer needed. Please consult us again if new need arises.  Gwenda Heiner, LCSW Clinical Social Worker [phone]    

## 2017-10-31 NOTE — Discharge Summary (Signed)
Discharge Summary  [name] TDD:[id number] DOB: 1921/08/02  PCP: Hoyt Koch, MD  Admit date: 10/28/2017 Discharge date:    Time spent: < 25 minutes  Admitted From: HOME Disposition:  SNF  Recommendations for Outpatient Follow-up:  1. Follow up with PCP in 1-2 weeks.   Discharge Diagnoses:  Active Hospital Problems   Diagnosis Date Noted  . Pubic ramus fracture ([place]) 10/28/2017  . Pubic ramus fracture, right, closed, initial encounter ([place]) 10/28/2017  . Hypertension 09/30/2007    Resolved Hospital Problems  No resolved problems to display.    Discharge Condition: Stable  CODE STATUS:Full Diet recommendation: Heart Healthy   Vitals:   10/30/17 2002 10/31/17 0442  BP: 139/68 (!) 145/70  Pulse: 71 62  Resp:    Temp: 98.8 F (37.1 C) 98.1 F (36.7 C)  SpO2: 99% 98%    History of present illness:  [name] is a 84 y.o. year old female with medical history significant for medical history significant for left breast cancer status post lumpectomy in 2011/left mastectomy 2012, GI bleed (NSAIDs) 2014, hypertension and SVT who presented on 10/28/2017 with several days with significant pain upon ambulation after fall 2 days prior to admission and was found to have minimally displaced pubic rami fracture on the right found a CT scan.  Admitted for pain control and mobilization with therapy. Remaining hospital course addressed in problem based format below:   Hospital Course:  Principal Problem:   Pubic ramus fracture ([place]) Active Problems:   Hypertension   Pubic ramus fracture, right, closed, initial encounter ([place])   1. R pubic ramus fracture. Presented to ED with groin pain related to a unwitnessed fall two days prior to admission.  Previous imaging on pelvis and right hip x-rays show edno visible fracture or dislocation.   CT scan of right hip on this admission showed minimally displaced inferior pubic rami fracture on the right with no evidence  of hip fracture. Orthopedics was consulted and recommended non-operative management, weight bearing as tolerated and continued mobilization with PT.  Pain with motion and upon standing prompted PT evaluation who recommended SNF for continued skilled physical therapy services.  Pain control was achieved and patient will be discharged on PRN tylenol and short 5 day course of PRN norco to use for severe breakthrough pain  2. Hypertension. Remained at goal with continuing her home Norvasc, lisinopril, and lopressor. Will continue similar dosage on discharge  Antibiotics: none  Microbiology:none  Consultations:  Orthopedics, Dr. Doreatha Martin   Procedures/Studies: none Ct Hip Right Wo Contrast  Result Date: 10/28/2017 CLINICAL DATA:  Fall yesterday.  Hip pain EXAM: CT OF THE RIGHT HIP WITHOUT CONTRAST TECHNIQUE: Multidetector CT imaging of the right hip was performed according to the standard protocol. Multiplanar CT image reconstructions were also generated. COMPARISON:  10/27/2017 FINDINGS: Bones/Joint/Cartilage Slightly displaced fracture inferior pubic ramus on the right. No other pelvic fracture. No fracture of the femur. Mild degenerative change in the right hip with joint space narrowing and acetabular spurring Mixed sclerotic and lytic bony abnormality through the right throughout the right hemipelvis consistent with Paget's disease as noted on x-ray from yesterday. Ligaments Suboptimally assessed by CT. Muscles and Tendons Negative Soft tissues Atherosclerotic calcification.  No soft tissue mass IMPRESSION: Slightly displaced fracture inferior pubic ramus on the right. Negative for femur fracture. Paget's disease right hemipelvis Degenerative change in the right hip joint. Electronically Signed   By: Franchot Gallo M.D.   On: 10/28/2017 18:31  Dg Knee Complete 4 Views Left  Result Date: 10/27/2017 CLINICAL DATA:  Pain following falls EXAM: LEFT KNEE - COMPLETE 4+ VIEW COMPARISON:  None. FINDINGS:  Frontal, lateral, and bilateral oblique views were obtained. Bones are osteoporotic. There is no fracture or dislocation. There is no appreciable joint effusion. There is moderate generalized joint space narrowing. There is spurring in all compartments. There is extensive chondrocalcinosis. There is calcification in the popliteal artery and trifurcation artery regions. IMPRESSION: Bones osteoporotic. No fracture or dislocation. No joint effusion. There is generalized osteoarthritic change. Chondrocalcinosis present, a finding that may be associated with osteoarthritis or calcium pyrophosphate deposition disease. Foci of arterial atherosclerosis noted. Electronically Signed   By: Lowella Grip III M.D.   On: 10/27/2017 09:51   Dg Hips Bilat W Or Wo Pelvis 3-4 Views  Addendum Date: 10/27/2017   ADDENDUM REPORT: 10/27/2017 09:53 ADDENDUM: Study compared to pelvis radiograph December 27, 2005 Electronically Signed   By: Lowella Grip III M.D.   On: 10/27/2017 09:53   Result Date: 10/27/2017 CLINICAL DATA:  Pain following fall EXAM: DG HIP (WITH OR WITHOUT PELVIS) 3-4V BILAT COMPARISON:  None. FINDINGS: Frontal pelvis as well as frontal and lateral views of each hip-total five views-obtained. There is no acute fracture or dislocation. There is moderate symmetric narrowing of both hip joints. There is Paget's disease throughout the right iliac bone including the right acetabulum as well as the right ischium. Bones are diffusely osteoporotic. There is degenerative change in visualized lumbar spine. IMPRESSION: No acute fracture or dislocation. Paget's disease throughout the right iliac bone and right ischium. Bones diffusely osteoporotic. Moderate symmetric narrowing both hip joints. Electronically Signed: By: Lowella Grip III M.D. On: 10/27/2017 09:49     Discharge Exam: BP (!) 145/70 (BP Location: Right Arm)   Pulse 62   Temp 98.1 F (36.7 C) (Oral)   Resp 17   Ht 5\' 6"  (1.676 m)   Wt 73.5 kg  (162 lb 0.6 oz)   SpO2 98%   BMI 26.15 kg/m   General: elderly female lying in bed comfortably Eyes: EOMI, anicteric ENT: Oral Mucosa clear and moist Cardiovascular: regular rate and rhythm, no murmurs, rubs or gallops, no edema, no JVD Respiratory: Normal respiratory effort on room air, lungs clear to auscultation bilaterally Abdomen: soft, non-distended, non-tender, normal bowel sounds Skin: No Rash Neurologic: Grossly no focal neuro deficit.Mental status alert and oriented to person, place. Not to time or context Psychiatric:Appropriate affect, and mood   Discharge Instructions You were cared for by a hospitalist during your hospital stay. If you have any questions about your discharge medications or the care you received while you were in the hospital after you are discharged, you can call the unit and asked to speak with the hospitalist on call if the hospitalist that took care of you is not available. Once you are discharged, your primary care physician will handle any further medical issues. Please note that NO REFILLS for any discharge medications will be authorized once you are discharged, as it is imperative that you return to your primary care physician (or establish a relationship with a primary care physician if you do not have one) for your aftercare needs so that they can reassess your need for medications and monitor your lab values.  Discharge Instructions    Diet - low sodium heart healthy   Complete by:  As directed    Increase activity slowly   Complete by:  As directed  Allergies as of 10/31/2017      Reactions   Codeine Nausea And Vomiting      Medication List    STOP taking these medications   lidocaine 5 % Commonly known as:  LIDODERM     TAKE these medications   acetaminophen 325 MG tablet Commonly known as:  TYLENOL Take 1-2 tablets (325-650 mg total) by mouth every 6 (six) hours as needed (for pain).   amLODipine 10 MG tablet Commonly known  as:  NORVASC TAKE 1 TABLET(10 MG) BY MOUTH DAILY   aspirin EC 81 MG tablet Take 1 tablet (81 mg total) by mouth daily.   bisacodyl 5 MG EC tablet Commonly known as:  DULCOLAX Take 1 tablet (5 mg total) by mouth daily as needed for moderate constipation.   diclofenac sodium 1 % Gel Commonly known as:  VOLTAREN Apply 2 g topically See admin instructions. Apply 2 grams to both knees for pain four times a day What changed:    when to take this  additional instructions   HYDROcodone-acetaminophen 5-325 MG tablet Commonly known as:  NORCO/VICODIN Take 1-2 tablets by mouth every 4 (four) hours as needed for up to 5 days for moderate pain.   lisinopril 40 MG tablet Commonly known as:  PRINIVIL,ZESTRIL Take 1 tablet (40 mg total) by mouth daily. Start taking on:  11/01/2017 What changed:  See the new instructions.   metoprolol tartrate 50 MG tablet Commonly known as:  LOPRESSOR Take 1 tablet (50 mg total) by mouth 2 (two) times daily.   pantoprazole 40 MG tablet Commonly known as:  PROTONIX Take 40 mg by mouth once a day   senna-docusate 8.6-50 MG tablet Commonly known as:  Senokot-S Take 1 tablet by mouth at bedtime as needed for mild constipation.   travoprost (benzalkonium) 0.004 % ophthalmic solution Commonly known as:  TRAVATAN Place 1 drop into both eyes at bedtime.   triamcinolone cream 0.1 % Commonly known as:  KENALOG Apply 1 application topically 2 (two) times daily.      Allergies  Allergen Reactions  . Codeine Nausea And Vomiting   Contact information for after-discharge care    [facility] .   Service:  Skilled Nursing Contact information: [address] [place] [phone]               The results of significant diagnostics from this hospitalization (including imaging, microbiology, ancillary and laboratory) are listed below for reference.    Significant Diagnostic Studies: Ct Hip  Right Wo Contrast  Result Date: 10/28/2017 CLINICAL DATA:  Fall yesterday.  Hip pain EXAM: CT OF THE RIGHT HIP WITHOUT CONTRAST TECHNIQUE: Multidetector CT imaging of the right hip was performed according to the standard protocol. Multiplanar CT image reconstructions were also generated. COMPARISON:  10/27/2017 FINDINGS: Bones/Joint/Cartilage Slightly displaced fracture inferior pubic ramus on the right. No other pelvic fracture. No fracture of the femur. Mild degenerative change in the right hip with joint space narrowing and acetabular spurring Mixed sclerotic and lytic bony abnormality through the right throughout the right hemipelvis consistent with Paget's disease as noted on x-ray from yesterday. Ligaments Suboptimally assessed by CT. Muscles and Tendons Negative Soft tissues Atherosclerotic calcification.  No soft tissue mass IMPRESSION: Slightly displaced fracture inferior pubic ramus on the right. Negative for femur fracture. Paget's disease right hemipelvis Degenerative change in the right hip joint. Electronically Signed   By: Franchot Gallo M.D.   On: 10/28/2017 18:31  Dg Knee Complete 4 Views Left  Result Date: 10/27/2017 CLINICAL DATA:  Pain following falls EXAM: LEFT KNEE - COMPLETE 4+ VIEW COMPARISON:  None. FINDINGS: Frontal, lateral, and bilateral oblique views were obtained. Bones are osteoporotic. There is no fracture or dislocation. There is no appreciable joint effusion. There is moderate generalized joint space narrowing. There is spurring in all compartments. There is extensive chondrocalcinosis. There is calcification in the popliteal artery and trifurcation artery regions. IMPRESSION: Bones osteoporotic. No fracture or dislocation. No joint effusion. There is generalized osteoarthritic change. Chondrocalcinosis present, a finding that may be associated with osteoarthritis or calcium pyrophosphate deposition disease. Foci of arterial atherosclerosis noted. Electronically Signed   By:  Lowella Grip III M.D.   On: 10/27/2017 09:51   Dg Hips Bilat W Or Wo Pelvis 3-4 Views  Addendum Date: 10/27/2017   ADDENDUM REPORT: 10/27/2017 09:53 ADDENDUM: Study compared to pelvis radiograph December 27, 2005 Electronically Signed   By: Lowella Grip III M.D.   On: 10/27/2017 09:53   Result Date: 10/27/2017 CLINICAL DATA:  Pain following fall EXAM: DG HIP (WITH OR WITHOUT PELVIS) 3-4V BILAT COMPARISON:  None. FINDINGS: Frontal pelvis as well as frontal and lateral views of each hip-total five views-obtained. There is no acute fracture or dislocation. There is moderate symmetric narrowing of both hip joints. There is Paget's disease throughout the right iliac bone including the right acetabulum as well as the right ischium. Bones are diffusely osteoporotic. There is degenerative change in visualized lumbar spine. IMPRESSION: No acute fracture or dislocation. Paget's disease throughout the right iliac bone and right ischium. Bones diffusely osteoporotic. Moderate symmetric narrowing both hip joints. Electronically Signed: By: Lowella Grip III M.D. On: 10/27/2017 09:49    Microbiology: No results found for this or any previous visit (from the past 240 hour(s)).   Labs: Basic Metabolic Panel: Recent Labs  Lab 10/28/17 1943  NA 142  K 4.4  CL 107  CO2 24  GLUCOSE 104*  BUN 15  CREATININE 0.84  CALCIUM 9.2   Liver Function Tests: No results for input(s): AST, ALT, ALKPHOS, BILITOT, PROT, ALBUMIN in the last 168 hours. No results for input(s): LIPASE, AMYLASE in the last 168 hours. No results for input(s): AMMONIA in the last 168 hours. CBC: Recent Labs  Lab 10/28/17 1943  WBC 9.3  NEUTROABS 6.7  HGB 12.9  HCT 40.6  MCV 84.1  PLT 394   Cardiac Enzymes: No results for input(s): CKTOTAL, CKMB, CKMBINDEX, TROPONINI in the last 168 hours. BNP: BNP (last 3 results) No results for input(s): BNP in the last 8760 hours.  ProBNP (last 3 results) No results for input(s):  PROBNP in the last 8760 hours.  CBG: No results for input(s): GLUCAP in the last 168 hours.     Signed:  Desiree Hane, MD [facility] 10/31/2017, 12:11 PM

## 2017-11-07 DIAGNOSIS — R5381 Other malaise: Secondary | ICD-10-CM | POA: Diagnosis not present

## 2017-11-07 DIAGNOSIS — E440 Moderate protein-calorie malnutrition: Secondary | ICD-10-CM | POA: Diagnosis not present

## 2017-11-07 DIAGNOSIS — I10 Essential (primary) hypertension: Secondary | ICD-10-CM | POA: Diagnosis not present

## 2017-11-07 DIAGNOSIS — S32301D Unspecified fracture of right ilium, subsequent encounter for fracture with routine healing: Secondary | ICD-10-CM | POA: Diagnosis not present

## 2017-11-19 DIAGNOSIS — W19XXXD Unspecified fall, subsequent encounter: Secondary | ICD-10-CM | POA: Diagnosis not present

## 2017-11-19 DIAGNOSIS — R2689 Other abnormalities of gait and mobility: Secondary | ICD-10-CM | POA: Diagnosis not present

## 2017-11-19 DIAGNOSIS — M6281 Muscle weakness (generalized): Secondary | ICD-10-CM | POA: Diagnosis not present

## 2017-11-19 DIAGNOSIS — R609 Edema, unspecified: Secondary | ICD-10-CM | POA: Diagnosis not present

## 2017-11-24 DIAGNOSIS — R2689 Other abnormalities of gait and mobility: Secondary | ICD-10-CM | POA: Diagnosis not present

## 2017-11-24 DIAGNOSIS — I351 Nonrheumatic aortic (valve) insufficiency: Secondary | ICD-10-CM | POA: Diagnosis not present

## 2017-11-24 DIAGNOSIS — R296 Repeated falls: Secondary | ICD-10-CM | POA: Diagnosis not present

## 2017-11-24 DIAGNOSIS — M6281 Muscle weakness (generalized): Secondary | ICD-10-CM | POA: Diagnosis not present

## 2017-11-24 DIAGNOSIS — S32591S Other specified fracture of right pubis, sequela: Secondary | ICD-10-CM | POA: Diagnosis not present

## 2017-11-25 ENCOUNTER — Telehealth: Payer: Self-pay | Admitting: Internal Medicine

## 2017-11-25 NOTE — Telephone Encounter (Signed)
Copied from [place] [id number]. Topic: Quick Communication - See Telephone Encounter >> Nov 25, 2017  4:00 PM Conception Chancy, NT wrote: CRM for notification. See Telephone encounter for: 11/25/17.  Cherlyn Cushing is calling from [facility] and received the referral for this patient. They will be starting home health services Thursday 11/27/17.   Cherlyn Cushing Cb# [phone]

## 2017-11-26 ENCOUNTER — Telehealth: Payer: Self-pay | Admitting: *Deleted

## 2017-11-26 ENCOUNTER — Telehealth: Payer: Self-pay | Admitting: Internal Medicine

## 2017-11-26 DIAGNOSIS — M1611 Unilateral primary osteoarthritis, right hip: Secondary | ICD-10-CM | POA: Diagnosis not present

## 2017-11-26 DIAGNOSIS — H409 Unspecified glaucoma: Secondary | ICD-10-CM | POA: Diagnosis not present

## 2017-11-26 DIAGNOSIS — I351 Nonrheumatic aortic (valve) insufficiency: Secondary | ICD-10-CM | POA: Diagnosis not present

## 2017-11-26 DIAGNOSIS — I471 Supraventricular tachycardia: Secondary | ICD-10-CM | POA: Diagnosis not present

## 2017-11-26 DIAGNOSIS — S32591D Other specified fracture of right pubis, subsequent encounter for fracture with routine healing: Secondary | ICD-10-CM | POA: Diagnosis not present

## 2017-11-26 DIAGNOSIS — I10 Essential (primary) hypertension: Secondary | ICD-10-CM | POA: Diagnosis not present

## 2017-11-26 NOTE — Telephone Encounter (Unsigned)
Copied from [place] [id number]. Topic: Quick Communication - See Telephone Encounter >> Nov 26, 2017  5:07 PM Percell Belt A wrote: CRM for notification. See Telephone encounter for: 11/26/17.  Ulice Dash with [facility] [phone] Need verbals for nursing  1 week 4 3 PRNS

## 2017-11-26 NOTE — Telephone Encounter (Signed)
Called pt to verify appt that was made for 12/08/17. Pt gave phone to granddaughter [name]) since she could not hear well. She had confir appt. Also inform pt/grand-daughter need to ask some additional question concerning discharge. Completed TCM call below.[name]  Transition Care Management Follow-up Telephone Call   Date discharged? 11/24/17 from SNF  How have you been since you were released from the hospital? Per Grand-daughter pt is doing ok she states she still need assist sometimes    Do you understand why you were in the hospital? YES   Do you understand the discharge instructions? YES   Where were you discharged to? Home   Items Reviewed:  Medications reviewed: YES  Allergies reviewed: YES  Dietary changes reviewed: YES  Referrals reviewed: No referral needed   Functional Questionnaire:   Activities of Daily Living (ADLs):   She states she are independent in the following: feeding, grooming and toileting States they require assistance with the following: ambulation, bathing and hygiene, continence and dressing   Any transportation issues/concerns?: NO   Any patient concerns? NO   Confirmed importance and date/time of follow-up visits scheduled YES, appt 12/08/17  Provider Appointment booked with Dr. Sharlet Salina   Confirmed with patient if condition begins to worsen call PCP or go to the ER.  Patient was given the office number and encouraged to call back with question or concerns.  : YES

## 2017-11-26 NOTE — Telephone Encounter (Signed)
Received msg in Patient [name] was Discharged from [facility] (SNF) to Home (Home) after 24 days of stay for Other specified fracture of right pubis, sequela (S32) [employer]. Will call pt to make sure  hosp f/u appt w/PCP has been made since she was released from SNF...Johny Chess

## 2017-11-26 NOTE — Telephone Encounter (Signed)
FYI

## 2017-11-27 NOTE — Telephone Encounter (Signed)
Fine

## 2017-11-27 NOTE — Telephone Encounter (Signed)
Verbals given  

## 2017-11-28 ENCOUNTER — Other Ambulatory Visit: Payer: Self-pay | Admitting: Internal Medicine

## 2017-12-01 DIAGNOSIS — S32591D Other specified fracture of right pubis, subsequent encounter for fracture with routine healing: Secondary | ICD-10-CM | POA: Diagnosis not present

## 2017-12-01 DIAGNOSIS — I351 Nonrheumatic aortic (valve) insufficiency: Secondary | ICD-10-CM | POA: Diagnosis not present

## 2017-12-01 DIAGNOSIS — I471 Supraventricular tachycardia: Secondary | ICD-10-CM | POA: Diagnosis not present

## 2017-12-01 DIAGNOSIS — M1611 Unilateral primary osteoarthritis, right hip: Secondary | ICD-10-CM | POA: Diagnosis not present

## 2017-12-01 DIAGNOSIS — H409 Unspecified glaucoma: Secondary | ICD-10-CM | POA: Diagnosis not present

## 2017-12-01 DIAGNOSIS — I10 Essential (primary) hypertension: Secondary | ICD-10-CM | POA: Diagnosis not present

## 2017-12-02 ENCOUNTER — Telehealth: Payer: Self-pay | Admitting: Internal Medicine

## 2017-12-02 DIAGNOSIS — H409 Unspecified glaucoma: Secondary | ICD-10-CM | POA: Diagnosis not present

## 2017-12-02 DIAGNOSIS — I351 Nonrheumatic aortic (valve) insufficiency: Secondary | ICD-10-CM | POA: Diagnosis not present

## 2017-12-02 DIAGNOSIS — I10 Essential (primary) hypertension: Secondary | ICD-10-CM | POA: Diagnosis not present

## 2017-12-02 DIAGNOSIS — I471 Supraventricular tachycardia: Secondary | ICD-10-CM | POA: Diagnosis not present

## 2017-12-02 DIAGNOSIS — S32591D Other specified fracture of right pubis, subsequent encounter for fracture with routine healing: Secondary | ICD-10-CM | POA: Diagnosis not present

## 2017-12-02 DIAGNOSIS — M1611 Unilateral primary osteoarthritis, right hip: Secondary | ICD-10-CM | POA: Diagnosis not present

## 2017-12-02 NOTE — Telephone Encounter (Signed)
Fine

## 2017-12-02 NOTE — Telephone Encounter (Signed)
Notified Gracie w/Md response.Marland KitchenJohny Chess

## 2017-12-02 NOTE — Telephone Encounter (Signed)
Copied from [place] [id number]. Topic: Quick Communication - See Telephone Encounter >> Dec 02, 2017  9:33 AM Ahmed Prima L wrote: CRM for notification. See Telephone encounter for: 12/02/17.  Gracie , physical therapist with [facility] needs verbals for 2 times a week for 6 weeks for physical therapy.  Call back @ [phone]

## 2017-12-03 DIAGNOSIS — I471 Supraventricular tachycardia: Secondary | ICD-10-CM | POA: Diagnosis not present

## 2017-12-03 DIAGNOSIS — I10 Essential (primary) hypertension: Secondary | ICD-10-CM | POA: Diagnosis not present

## 2017-12-03 DIAGNOSIS — H409 Unspecified glaucoma: Secondary | ICD-10-CM | POA: Diagnosis not present

## 2017-12-03 DIAGNOSIS — M1611 Unilateral primary osteoarthritis, right hip: Secondary | ICD-10-CM | POA: Diagnosis not present

## 2017-12-03 DIAGNOSIS — S32591D Other specified fracture of right pubis, subsequent encounter for fracture with routine healing: Secondary | ICD-10-CM | POA: Diagnosis not present

## 2017-12-03 DIAGNOSIS — I351 Nonrheumatic aortic (valve) insufficiency: Secondary | ICD-10-CM | POA: Diagnosis not present

## 2017-12-08 ENCOUNTER — Encounter: Payer: Self-pay | Admitting: Internal Medicine

## 2017-12-08 ENCOUNTER — Ambulatory Visit (INDEPENDENT_AMBULATORY_CARE_PROVIDER_SITE_OTHER): Payer: Medicare Other | Admitting: Internal Medicine

## 2017-12-08 VITALS — BP 168/80 | HR 65 | Temp 98.1°F | Ht 66.0 in | Wt 171.0 lb

## 2017-12-08 DIAGNOSIS — R300 Dysuria: Secondary | ICD-10-CM

## 2017-12-08 DIAGNOSIS — S32591A Other specified fracture of right pubis, initial encounter for closed fracture: Secondary | ICD-10-CM

## 2017-12-08 DIAGNOSIS — I10 Essential (primary) hypertension: Secondary | ICD-10-CM | POA: Diagnosis not present

## 2017-12-08 LAB — POCT URINALYSIS DIPSTICK
Bilirubin, UA: NEGATIVE
Blood, UA: NEGATIVE
GLUCOSE UA: NEGATIVE
Ketones, UA: NEGATIVE
Leukocytes, UA: NEGATIVE
Nitrite, UA: NEGATIVE
Protein, UA: NEGATIVE
Urobilinogen, UA: 0.2 E.U./dL
pH, UA: 6 (ref 5.0–8.0)

## 2017-12-08 MED ORDER — AMLODIPINE BESYLATE 10 MG PO TABS: ORAL_TABLET | ORAL | 3 refills | Status: DC

## 2017-12-08 MED ORDER — METOPROLOL TARTRATE 50 MG PO TABS: 50.0000 mg | ORAL_TABLET | Freq: Two times a day (BID) | ORAL | 3 refills | Status: DC

## 2017-12-08 MED ORDER — LISINOPRIL 40 MG PO TABS: 40.0000 mg | ORAL_TABLET | Freq: Every day | ORAL | 3 refills | Status: DC

## 2017-12-08 MED ORDER — PANTOPRAZOLE SODIUM 40 MG PO TBEC: DELAYED_RELEASE_TABLET | ORAL | 3 refills | Status: DC

## 2017-12-08 NOTE — Progress Notes (Signed)
   Subjective:    Patient ID: [name], female    DOB: 1921/06/17, 84 y.o.   MRN: [id number]  HPI The patient is a 84 YO female coming in for SNF follow up (in hospital for pelvis fracture and D/C to SNF for pain control and therapy, she did okay at SNF). She is now home from SNF for about 2 weeks or so. She denies falls since being home. She denies stomach pain, diarrhea, constipation. She denies cough or SOB. She denies chest pains. Some mild dysuria and wants to get that checked today. Denies swelling. Is using walker at home with some stability but uses wheelchair for long distances. Still doing PT at home. Chronic right knee pain which is some worse. Taking only tylenol for pain currently which is helping well.   PMH, [facility], social history reviewed and updated.   Review of Systems  Constitutional: Positive for activity change. Negative for appetite change, fatigue and fever.  HENT: Negative.   Eyes: Negative.   Respiratory: Negative for cough, chest tightness and shortness of breath.   Cardiovascular: Negative for chest pain, palpitations and leg swelling.  Gastrointestinal: Negative for abdominal distention, abdominal pain, constipation, diarrhea, nausea and vomiting.  Musculoskeletal: Positive for arthralgias and gait problem. Negative for back pain, joint swelling, myalgias and neck pain.  Skin: Negative.   Neurological: Positive for weakness. Negative for dizziness, seizures, facial asymmetry, light-headedness and numbness.  Psychiatric/Behavioral: Negative.       Objective:   Physical Exam  Constitutional: She is oriented to person, place, and time. She appears well-developed and well-nourished.  HENT:  Head: Normocephalic and atraumatic.  Eyes: EOM are normal.  Neck: Normal range of motion.  Cardiovascular: Normal rate and regular rhythm.  Pulmonary/Chest: Effort normal and breath sounds normal. No respiratory distress. She has no wheezes. She has no rales.  Abdominal:  Soft. Bowel sounds are normal. She exhibits no distension. There is no tenderness. There is no rebound.  Musculoskeletal: She exhibits no edema.  Neurological: She is alert and oriented to person, place, and time. Coordination abnormal.  wheelchair  Skin: Skin is warm and dry.  Psychiatric: She has a normal mood and affect.   Vitals:   12/08/17 1347  BP: (!) 168/80  Pulse: 65  Temp: 98.1 F (36.7 C)  TempSrc: Oral  SpO2: 96%  Weight: 171 lb (77.6 kg)  Height: 5\' 6"  (1.676 m)      Assessment & Plan:

## 2017-12-08 NOTE — Patient Instructions (Signed)
There are no signs of urine infection. Work on drinking more water to help with the discomfort.   Keep working with therapy to keep your strength good.

## 2017-12-09 DIAGNOSIS — I351 Nonrheumatic aortic (valve) insufficiency: Secondary | ICD-10-CM | POA: Diagnosis not present

## 2017-12-09 DIAGNOSIS — H409 Unspecified glaucoma: Secondary | ICD-10-CM | POA: Diagnosis not present

## 2017-12-09 DIAGNOSIS — I471 Supraventricular tachycardia: Secondary | ICD-10-CM | POA: Diagnosis not present

## 2017-12-09 DIAGNOSIS — I10 Essential (primary) hypertension: Secondary | ICD-10-CM | POA: Diagnosis not present

## 2017-12-09 DIAGNOSIS — M1611 Unilateral primary osteoarthritis, right hip: Secondary | ICD-10-CM | POA: Diagnosis not present

## 2017-12-09 DIAGNOSIS — S32591D Other specified fracture of right pubis, subsequent encounter for fracture with routine healing: Secondary | ICD-10-CM | POA: Diagnosis not present

## 2017-12-09 NOTE — Assessment & Plan Note (Signed)
BP slightly above goal today and will continue to monitor. Taking amlodipine 10 mg daily, lisinopril 40 mg daily, metoprolol 50 mg BID. Recent BMP without changes.

## 2017-12-09 NOTE — Assessment & Plan Note (Signed)
Appears to be healing appropriately and pain is decreasing. Using tylenol only for pain. Is walking with walker and will continue with PT at home until she is more stable with walking.

## 2017-12-09 NOTE — Assessment & Plan Note (Signed)
Checked U/A in the office which is negative for infection. Advised to increase fluids.

## 2017-12-10 DIAGNOSIS — I10 Essential (primary) hypertension: Secondary | ICD-10-CM | POA: Diagnosis not present

## 2017-12-10 DIAGNOSIS — S32591D Other specified fracture of right pubis, subsequent encounter for fracture with routine healing: Secondary | ICD-10-CM | POA: Diagnosis not present

## 2017-12-10 DIAGNOSIS — I471 Supraventricular tachycardia: Secondary | ICD-10-CM | POA: Diagnosis not present

## 2017-12-10 DIAGNOSIS — M1611 Unilateral primary osteoarthritis, right hip: Secondary | ICD-10-CM | POA: Diagnosis not present

## 2017-12-10 DIAGNOSIS — H409 Unspecified glaucoma: Secondary | ICD-10-CM | POA: Diagnosis not present

## 2017-12-10 DIAGNOSIS — I351 Nonrheumatic aortic (valve) insufficiency: Secondary | ICD-10-CM | POA: Diagnosis not present

## 2017-12-11 DIAGNOSIS — I351 Nonrheumatic aortic (valve) insufficiency: Secondary | ICD-10-CM | POA: Diagnosis not present

## 2017-12-11 DIAGNOSIS — H409 Unspecified glaucoma: Secondary | ICD-10-CM | POA: Diagnosis not present

## 2017-12-11 DIAGNOSIS — M1611 Unilateral primary osteoarthritis, right hip: Secondary | ICD-10-CM | POA: Diagnosis not present

## 2017-12-11 DIAGNOSIS — S32591D Other specified fracture of right pubis, subsequent encounter for fracture with routine healing: Secondary | ICD-10-CM | POA: Diagnosis not present

## 2017-12-11 DIAGNOSIS — I471 Supraventricular tachycardia: Secondary | ICD-10-CM | POA: Diagnosis not present

## 2017-12-11 DIAGNOSIS — I10 Essential (primary) hypertension: Secondary | ICD-10-CM | POA: Diagnosis not present

## 2017-12-12 DIAGNOSIS — I471 Supraventricular tachycardia: Secondary | ICD-10-CM | POA: Diagnosis not present

## 2017-12-12 DIAGNOSIS — I10 Essential (primary) hypertension: Secondary | ICD-10-CM | POA: Diagnosis not present

## 2017-12-12 DIAGNOSIS — Z9012 Acquired absence of left breast and nipple: Secondary | ICD-10-CM | POA: Diagnosis not present

## 2017-12-12 DIAGNOSIS — Z853 Personal history of malignant neoplasm of breast: Secondary | ICD-10-CM | POA: Diagnosis not present

## 2017-12-12 DIAGNOSIS — I351 Nonrheumatic aortic (valve) insufficiency: Secondary | ICD-10-CM | POA: Diagnosis not present

## 2017-12-12 DIAGNOSIS — Z7982 Long term (current) use of aspirin: Secondary | ICD-10-CM | POA: Diagnosis not present

## 2017-12-12 DIAGNOSIS — Z9181 History of falling: Secondary | ICD-10-CM | POA: Diagnosis not present

## 2017-12-12 DIAGNOSIS — H409 Unspecified glaucoma: Secondary | ICD-10-CM | POA: Diagnosis not present

## 2017-12-12 DIAGNOSIS — K219 Gastro-esophageal reflux disease without esophagitis: Secondary | ICD-10-CM | POA: Diagnosis not present

## 2017-12-12 DIAGNOSIS — S32591D Other specified fracture of right pubis, subsequent encounter for fracture with routine healing: Secondary | ICD-10-CM | POA: Diagnosis not present

## 2017-12-12 DIAGNOSIS — M1611 Unilateral primary osteoarthritis, right hip: Secondary | ICD-10-CM | POA: Diagnosis not present

## 2017-12-16 DIAGNOSIS — M1611 Unilateral primary osteoarthritis, right hip: Secondary | ICD-10-CM | POA: Diagnosis not present

## 2017-12-16 DIAGNOSIS — I10 Essential (primary) hypertension: Secondary | ICD-10-CM | POA: Diagnosis not present

## 2017-12-16 DIAGNOSIS — S32591D Other specified fracture of right pubis, subsequent encounter for fracture with routine healing: Secondary | ICD-10-CM | POA: Diagnosis not present

## 2017-12-16 DIAGNOSIS — H409 Unspecified glaucoma: Secondary | ICD-10-CM | POA: Diagnosis not present

## 2017-12-16 DIAGNOSIS — I351 Nonrheumatic aortic (valve) insufficiency: Secondary | ICD-10-CM | POA: Diagnosis not present

## 2017-12-16 DIAGNOSIS — I471 Supraventricular tachycardia: Secondary | ICD-10-CM | POA: Diagnosis not present

## 2017-12-18 DIAGNOSIS — S32591D Other specified fracture of right pubis, subsequent encounter for fracture with routine healing: Secondary | ICD-10-CM | POA: Diagnosis not present

## 2017-12-18 DIAGNOSIS — I351 Nonrheumatic aortic (valve) insufficiency: Secondary | ICD-10-CM | POA: Diagnosis not present

## 2017-12-18 DIAGNOSIS — M1611 Unilateral primary osteoarthritis, right hip: Secondary | ICD-10-CM | POA: Diagnosis not present

## 2017-12-18 DIAGNOSIS — I471 Supraventricular tachycardia: Secondary | ICD-10-CM | POA: Diagnosis not present

## 2017-12-18 DIAGNOSIS — I10 Essential (primary) hypertension: Secondary | ICD-10-CM | POA: Diagnosis not present

## 2017-12-18 DIAGNOSIS — H409 Unspecified glaucoma: Secondary | ICD-10-CM | POA: Diagnosis not present

## 2017-12-22 DIAGNOSIS — I10 Essential (primary) hypertension: Secondary | ICD-10-CM | POA: Diagnosis not present

## 2017-12-22 DIAGNOSIS — S32591D Other specified fracture of right pubis, subsequent encounter for fracture with routine healing: Secondary | ICD-10-CM | POA: Diagnosis not present

## 2017-12-22 DIAGNOSIS — I351 Nonrheumatic aortic (valve) insufficiency: Secondary | ICD-10-CM | POA: Diagnosis not present

## 2017-12-22 DIAGNOSIS — H401131 Primary open-angle glaucoma, bilateral, mild stage: Secondary | ICD-10-CM | POA: Diagnosis not present

## 2017-12-22 DIAGNOSIS — I471 Supraventricular tachycardia: Secondary | ICD-10-CM | POA: Diagnosis not present

## 2017-12-22 DIAGNOSIS — M1611 Unilateral primary osteoarthritis, right hip: Secondary | ICD-10-CM | POA: Diagnosis not present

## 2017-12-22 DIAGNOSIS — H409 Unspecified glaucoma: Secondary | ICD-10-CM | POA: Diagnosis not present

## 2017-12-23 DIAGNOSIS — H409 Unspecified glaucoma: Secondary | ICD-10-CM | POA: Diagnosis not present

## 2017-12-23 DIAGNOSIS — I10 Essential (primary) hypertension: Secondary | ICD-10-CM | POA: Diagnosis not present

## 2017-12-23 DIAGNOSIS — I471 Supraventricular tachycardia: Secondary | ICD-10-CM | POA: Diagnosis not present

## 2017-12-23 DIAGNOSIS — I351 Nonrheumatic aortic (valve) insufficiency: Secondary | ICD-10-CM | POA: Diagnosis not present

## 2017-12-23 DIAGNOSIS — S32591D Other specified fracture of right pubis, subsequent encounter for fracture with routine healing: Secondary | ICD-10-CM | POA: Diagnosis not present

## 2017-12-23 DIAGNOSIS — M1611 Unilateral primary osteoarthritis, right hip: Secondary | ICD-10-CM | POA: Diagnosis not present

## 2017-12-24 DIAGNOSIS — S32591D Other specified fracture of right pubis, subsequent encounter for fracture with routine healing: Secondary | ICD-10-CM | POA: Diagnosis not present

## 2017-12-24 DIAGNOSIS — I471 Supraventricular tachycardia: Secondary | ICD-10-CM | POA: Diagnosis not present

## 2017-12-24 DIAGNOSIS — M1611 Unilateral primary osteoarthritis, right hip: Secondary | ICD-10-CM | POA: Diagnosis not present

## 2017-12-24 DIAGNOSIS — I351 Nonrheumatic aortic (valve) insufficiency: Secondary | ICD-10-CM | POA: Diagnosis not present

## 2017-12-24 DIAGNOSIS — H409 Unspecified glaucoma: Secondary | ICD-10-CM | POA: Diagnosis not present

## 2017-12-24 DIAGNOSIS — I10 Essential (primary) hypertension: Secondary | ICD-10-CM | POA: Diagnosis not present

## 2017-12-25 DIAGNOSIS — S32591D Other specified fracture of right pubis, subsequent encounter for fracture with routine healing: Secondary | ICD-10-CM | POA: Diagnosis not present

## 2017-12-25 DIAGNOSIS — I351 Nonrheumatic aortic (valve) insufficiency: Secondary | ICD-10-CM | POA: Diagnosis not present

## 2017-12-25 DIAGNOSIS — H409 Unspecified glaucoma: Secondary | ICD-10-CM | POA: Diagnosis not present

## 2017-12-25 DIAGNOSIS — I10 Essential (primary) hypertension: Secondary | ICD-10-CM | POA: Diagnosis not present

## 2017-12-25 DIAGNOSIS — M1611 Unilateral primary osteoarthritis, right hip: Secondary | ICD-10-CM | POA: Diagnosis not present

## 2017-12-25 DIAGNOSIS — I471 Supraventricular tachycardia: Secondary | ICD-10-CM | POA: Diagnosis not present

## 2017-12-29 DIAGNOSIS — I10 Essential (primary) hypertension: Secondary | ICD-10-CM | POA: Diagnosis not present

## 2017-12-29 DIAGNOSIS — I471 Supraventricular tachycardia: Secondary | ICD-10-CM | POA: Diagnosis not present

## 2017-12-29 DIAGNOSIS — I351 Nonrheumatic aortic (valve) insufficiency: Secondary | ICD-10-CM | POA: Diagnosis not present

## 2017-12-29 DIAGNOSIS — M1611 Unilateral primary osteoarthritis, right hip: Secondary | ICD-10-CM | POA: Diagnosis not present

## 2017-12-29 DIAGNOSIS — H409 Unspecified glaucoma: Secondary | ICD-10-CM | POA: Diagnosis not present

## 2017-12-29 DIAGNOSIS — S32591D Other specified fracture of right pubis, subsequent encounter for fracture with routine healing: Secondary | ICD-10-CM | POA: Diagnosis not present

## 2017-12-30 DIAGNOSIS — I351 Nonrheumatic aortic (valve) insufficiency: Secondary | ICD-10-CM | POA: Diagnosis not present

## 2017-12-30 DIAGNOSIS — M1611 Unilateral primary osteoarthritis, right hip: Secondary | ICD-10-CM | POA: Diagnosis not present

## 2017-12-30 DIAGNOSIS — S32591D Other specified fracture of right pubis, subsequent encounter for fracture with routine healing: Secondary | ICD-10-CM | POA: Diagnosis not present

## 2017-12-30 DIAGNOSIS — H409 Unspecified glaucoma: Secondary | ICD-10-CM | POA: Diagnosis not present

## 2017-12-30 DIAGNOSIS — I471 Supraventricular tachycardia: Secondary | ICD-10-CM | POA: Diagnosis not present

## 2017-12-30 DIAGNOSIS — I10 Essential (primary) hypertension: Secondary | ICD-10-CM | POA: Diagnosis not present

## 2017-12-31 DIAGNOSIS — I351 Nonrheumatic aortic (valve) insufficiency: Secondary | ICD-10-CM | POA: Diagnosis not present

## 2017-12-31 DIAGNOSIS — I471 Supraventricular tachycardia: Secondary | ICD-10-CM | POA: Diagnosis not present

## 2017-12-31 DIAGNOSIS — S32591D Other specified fracture of right pubis, subsequent encounter for fracture with routine healing: Secondary | ICD-10-CM | POA: Diagnosis not present

## 2017-12-31 DIAGNOSIS — H409 Unspecified glaucoma: Secondary | ICD-10-CM | POA: Diagnosis not present

## 2017-12-31 DIAGNOSIS — I10 Essential (primary) hypertension: Secondary | ICD-10-CM | POA: Diagnosis not present

## 2017-12-31 DIAGNOSIS — M1611 Unilateral primary osteoarthritis, right hip: Secondary | ICD-10-CM | POA: Diagnosis not present

## 2018-01-01 DIAGNOSIS — I351 Nonrheumatic aortic (valve) insufficiency: Secondary | ICD-10-CM | POA: Diagnosis not present

## 2018-01-01 DIAGNOSIS — I10 Essential (primary) hypertension: Secondary | ICD-10-CM | POA: Diagnosis not present

## 2018-01-01 DIAGNOSIS — I471 Supraventricular tachycardia: Secondary | ICD-10-CM | POA: Diagnosis not present

## 2018-01-01 DIAGNOSIS — H409 Unspecified glaucoma: Secondary | ICD-10-CM | POA: Diagnosis not present

## 2018-01-01 DIAGNOSIS — S32591D Other specified fracture of right pubis, subsequent encounter for fracture with routine healing: Secondary | ICD-10-CM | POA: Diagnosis not present

## 2018-01-01 DIAGNOSIS — M1611 Unilateral primary osteoarthritis, right hip: Secondary | ICD-10-CM | POA: Diagnosis not present

## 2018-01-07 ENCOUNTER — Telehealth: Payer: Self-pay | Admitting: Internal Medicine

## 2018-01-07 NOTE — Telephone Encounter (Signed)
Notified [name] w/MD response.Marland KitchenJohny [name]

## 2018-01-07 NOTE — Telephone Encounter (Signed)
Copied from [place] [id number]. Topic: Quick Communication - See Telephone Encounter >> Jan 07, 2018  8:55 AM Conception Chancy, NT wrote: CRM for notification. See Telephone encounter for: 01/07/18.  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 Please advise.   Cb# [phone]

## 2018-01-07 NOTE — Telephone Encounter (Signed)
Okay 

## 2018-01-08 DIAGNOSIS — S32591D Other specified fracture of right pubis, subsequent encounter for fracture with routine healing: Secondary | ICD-10-CM | POA: Diagnosis not present

## 2018-01-08 DIAGNOSIS — I471 Supraventricular tachycardia: Secondary | ICD-10-CM | POA: Diagnosis not present

## 2018-01-08 DIAGNOSIS — H409 Unspecified glaucoma: Secondary | ICD-10-CM | POA: Diagnosis not present

## 2018-01-08 DIAGNOSIS — M1611 Unilateral primary osteoarthritis, right hip: Secondary | ICD-10-CM | POA: Diagnosis not present

## 2018-01-08 DIAGNOSIS — I351 Nonrheumatic aortic (valve) insufficiency: Secondary | ICD-10-CM | POA: Diagnosis not present

## 2018-01-08 DIAGNOSIS — I10 Essential (primary) hypertension: Secondary | ICD-10-CM | POA: Diagnosis not present

## 2018-01-10 DIAGNOSIS — H409 Unspecified glaucoma: Secondary | ICD-10-CM | POA: Diagnosis not present

## 2018-01-10 DIAGNOSIS — M1611 Unilateral primary osteoarthritis, right hip: Secondary | ICD-10-CM | POA: Diagnosis not present

## 2018-01-10 DIAGNOSIS — I471 Supraventricular tachycardia: Secondary | ICD-10-CM | POA: Diagnosis not present

## 2018-01-10 DIAGNOSIS — S32591D Other specified fracture of right pubis, subsequent encounter for fracture with routine healing: Secondary | ICD-10-CM | POA: Diagnosis not present

## 2018-01-10 DIAGNOSIS — I351 Nonrheumatic aortic (valve) insufficiency: Secondary | ICD-10-CM | POA: Diagnosis not present

## 2018-01-10 DIAGNOSIS — I10 Essential (primary) hypertension: Secondary | ICD-10-CM | POA: Diagnosis not present

## 2018-01-13 DIAGNOSIS — I10 Essential (primary) hypertension: Secondary | ICD-10-CM | POA: Diagnosis not present

## 2018-01-13 DIAGNOSIS — M1611 Unilateral primary osteoarthritis, right hip: Secondary | ICD-10-CM | POA: Diagnosis not present

## 2018-01-13 DIAGNOSIS — H409 Unspecified glaucoma: Secondary | ICD-10-CM | POA: Diagnosis not present

## 2018-01-13 DIAGNOSIS — S32591D Other specified fracture of right pubis, subsequent encounter for fracture with routine healing: Secondary | ICD-10-CM | POA: Diagnosis not present

## 2018-01-13 DIAGNOSIS — I471 Supraventricular tachycardia: Secondary | ICD-10-CM | POA: Diagnosis not present

## 2018-01-13 DIAGNOSIS — I351 Nonrheumatic aortic (valve) insufficiency: Secondary | ICD-10-CM | POA: Diagnosis not present

## 2018-01-15 DIAGNOSIS — I10 Essential (primary) hypertension: Secondary | ICD-10-CM | POA: Diagnosis not present

## 2018-01-15 DIAGNOSIS — M1611 Unilateral primary osteoarthritis, right hip: Secondary | ICD-10-CM | POA: Diagnosis not present

## 2018-01-15 DIAGNOSIS — I351 Nonrheumatic aortic (valve) insufficiency: Secondary | ICD-10-CM | POA: Diagnosis not present

## 2018-01-15 DIAGNOSIS — I471 Supraventricular tachycardia: Secondary | ICD-10-CM | POA: Diagnosis not present

## 2018-01-15 DIAGNOSIS — H409 Unspecified glaucoma: Secondary | ICD-10-CM | POA: Diagnosis not present

## 2018-01-15 DIAGNOSIS — S32591D Other specified fracture of right pubis, subsequent encounter for fracture with routine healing: Secondary | ICD-10-CM | POA: Diagnosis not present

## 2018-01-20 DIAGNOSIS — I471 Supraventricular tachycardia: Secondary | ICD-10-CM | POA: Diagnosis not present

## 2018-01-20 DIAGNOSIS — I10 Essential (primary) hypertension: Secondary | ICD-10-CM | POA: Diagnosis not present

## 2018-01-20 DIAGNOSIS — S32591D Other specified fracture of right pubis, subsequent encounter for fracture with routine healing: Secondary | ICD-10-CM | POA: Diagnosis not present

## 2018-01-20 DIAGNOSIS — I351 Nonrheumatic aortic (valve) insufficiency: Secondary | ICD-10-CM | POA: Diagnosis not present

## 2018-01-20 DIAGNOSIS — H409 Unspecified glaucoma: Secondary | ICD-10-CM | POA: Diagnosis not present

## 2018-01-20 DIAGNOSIS — M1611 Unilateral primary osteoarthritis, right hip: Secondary | ICD-10-CM | POA: Diagnosis not present

## 2018-01-23 ENCOUNTER — Other Ambulatory Visit: Payer: Self-pay | Admitting: Internal Medicine

## 2018-01-23 DIAGNOSIS — I351 Nonrheumatic aortic (valve) insufficiency: Secondary | ICD-10-CM | POA: Diagnosis not present

## 2018-01-23 DIAGNOSIS — I10 Essential (primary) hypertension: Secondary | ICD-10-CM | POA: Diagnosis not present

## 2018-01-23 DIAGNOSIS — H409 Unspecified glaucoma: Secondary | ICD-10-CM | POA: Diagnosis not present

## 2018-01-23 DIAGNOSIS — I471 Supraventricular tachycardia: Secondary | ICD-10-CM | POA: Diagnosis not present

## 2018-01-23 DIAGNOSIS — M1611 Unilateral primary osteoarthritis, right hip: Secondary | ICD-10-CM | POA: Diagnosis not present

## 2018-01-23 DIAGNOSIS — S32591D Other specified fracture of right pubis, subsequent encounter for fracture with routine healing: Secondary | ICD-10-CM | POA: Diagnosis not present

## 2018-02-19 ENCOUNTER — Other Ambulatory Visit: Payer: Self-pay

## 2018-02-19 ENCOUNTER — Encounter (HOSPITAL_COMMUNITY): Payer: Self-pay | Admitting: Emergency Medicine

## 2018-02-19 ENCOUNTER — Emergency Department (HOSPITAL_COMMUNITY)
Admission: EM | Admit: 2018-02-19 | Discharge: 2018-02-19 | Disposition: A | Discharge status: Home / Self Care | Payer: Medicare Other | Attending: Emergency Medicine | Admitting: Emergency Medicine

## 2018-02-19 ENCOUNTER — Emergency Department (HOSPITAL_COMMUNITY): Payer: Medicare Other

## 2018-02-19 DIAGNOSIS — R0789 Other chest pain: Secondary | ICD-10-CM | POA: Diagnosis not present

## 2018-02-19 DIAGNOSIS — Z853 Personal history of malignant neoplasm of breast: Secondary | ICD-10-CM | POA: Diagnosis not present

## 2018-02-19 DIAGNOSIS — I10 Essential (primary) hypertension: Secondary | ICD-10-CM | POA: Insufficient documentation

## 2018-02-19 DIAGNOSIS — I959 Hypotension, unspecified: Secondary | ICD-10-CM | POA: Diagnosis not present

## 2018-02-19 DIAGNOSIS — Z79899 Other long term (current) drug therapy: Secondary | ICD-10-CM | POA: Insufficient documentation

## 2018-02-19 DIAGNOSIS — R079 Chest pain, unspecified: Secondary | ICD-10-CM | POA: Insufficient documentation

## 2018-02-19 DIAGNOSIS — Z7982 Long term (current) use of aspirin: Secondary | ICD-10-CM | POA: Diagnosis not present

## 2018-02-19 DIAGNOSIS — I491 Atrial premature depolarization: Secondary | ICD-10-CM | POA: Diagnosis not present

## 2018-02-19 DIAGNOSIS — R000 Tachycardia, unspecified: Secondary | ICD-10-CM | POA: Diagnosis not present

## 2018-02-19 LAB — CBC
HCT: 38.8 % (ref 36.0–46.0)
Hemoglobin: 11.7 g/dL — ABNORMAL LOW (ref 12.0–15.0)
MCH: 26.8 pg (ref 26.0–34.0)
MCHC: 30.2 g/dL (ref 30.0–36.0)
MCV: 88.8 fL (ref 78.0–100.0)
Platelets: 354 10*3/uL (ref 150–400)
RBC: 4.37 MIL/uL (ref 3.87–5.11)
RDW: 14.5 % (ref 11.5–15.5)
WBC: 5.1 10*3/uL (ref 4.0–10.5)

## 2018-02-19 LAB — BASIC METABOLIC PANEL
Anion gap: 14 (ref 5–15)
BUN: 17 mg/dL (ref 8–23)
CO2: 22 mmol/L (ref 22–32)
Calcium: 9.1 mg/dL (ref 8.9–10.3)
Chloride: 105 mmol/L (ref 98–111)
Creatinine, Ser: 0.88 mg/dL (ref 0.44–1.00)
GFR calc Af Amer: >60 mL/min (ref >60)
GFR, EST NON AFRICAN AMERICAN: 55 mL/min — AB (ref >60)
Glucose, Bld: 98 mg/dL (ref 70–99)
Potassium: 4.1 mmol/L (ref 3.5–5.1)
Sodium: 141 mmol/L (ref 135–145)

## 2018-02-19 LAB — URINALYSIS, ROUTINE W REFLEX MICROSCOPIC
Bilirubin Urine: NEGATIVE
Glucose, UA: NEGATIVE mg/dL
Hgb urine dipstick: NEGATIVE
KETONES UR: 5 mg/dL — AB
Leukocytes, UA: NEGATIVE
Nitrite: NEGATIVE
PROTEIN: NEGATIVE mg/dL
Specific Gravity, Urine: 1.008 (ref 1.005–1.030)
pH: 7 (ref 5.0–8.0)

## 2018-02-19 LAB — I-STAT TROPONIN, ED: TROPONIN I, POC: 0 ng/mL (ref 0.00–0.08)

## 2018-02-19 NOTE — ED Triage Notes (Signed)
Patient presents to the ED from home with complaints of chest pain. EMS states HR 74-114. Patient denies any chest pain on arrival. Patient alert and oriented x4 on arrival. Patient denies any complaints on arrival.

## 2018-02-19 NOTE — Discharge Instructions (Signed)
Tests showed no life-threatening conditions.  Follow-up with your primary care doctor.

## 2018-02-19 NOTE — ED Notes (Signed)
Patient verbalizes understanding of discharge instructions. Opportunity for questioning and answers were provided. Armband removed by staff, pt discharged from ED wheelchair.Pt discharged from ED; instructions provided and scripts given; Pt encouraged to return to ED if symptoms worsen and to f/u with PCP; Pt verbalized understanding of all instructions

## 2018-02-19 NOTE — ED Notes (Signed)
Got patient undress on the monitor did ekg shown to Dr Lacinda Axon patient is resting with call bell in reach

## 2018-02-19 NOTE — ED Provider Notes (Signed)
[place] EMERGENCY DEPARTMENT Provider Note   CSN: [id number] Arrival date & time: 02/19/18  6384     History   Chief Complaint Chief Complaint  Patient presents with  . Chest Pain    HPI [name] is a 84 y.o. female.  Chest pain described as tightness since 7 AM without dyspnea, nausea, diaphoresis.  She felt hot at the time.  She claims to have no chronic health problems, but this is not congruent with her documented past medical history.  Symptoms are not associate with any activity.  They are gone now.  Severity is mild.  Review of system is positive for questionable hematuria.  No dysuria.     Past Medical History:  Diagnosis Date  . Arthritis   . Cancer [facility]) 2011, 2012   left breast cancer 2011 central excision,2012 mastectomy  . Complication of anesthesia    shortness of breath after procedure  . Glaucoma   . Hypertension   . Protein-calorie malnutrition, severe ([place])   . SVT (supraventricular tachycardia) ([place])   . Upper GI bleed 01/2013.    Melena. HPylori negative gastritis on EGD.     Patient Active Problem List   Diagnosis Date Noted  . Pubic ramus fracture ([place]) 10/28/2017  . Aortic valve regurgitation 07/01/2017  . Fall 12/05/2016  . Debilitated 08/10/2014  . Physical deconditioning   . Protein-calorie malnutrition, severe ([place]) 02/24/2014  . SVT (supraventricular tachycardia) ([place]) 02/23/2014  . Dysuria 02/09/2014  . DJD (degenerative joint disease) 04/07/2013  . Breast cancer- s/p mastectomy 03/11/2013  . GERD 03/10/2008  . GLAUCOMA 09/30/2007  . Hypertension 09/30/2007    Past Surgical History:  Procedure Laterality Date  . ABDOMINAL HYSTERECTOMY    . BREAST SURGERY  2011  . BREAST SURGERY  2012   lt breast masty  . CHOLECYSTECTOMY    . ESOPHAGOGASTRODUODENOSCOPY N/A 01/25/2013   Procedure: ESOPHAGOGASTRODUODENOSCOPY (EGD);  Surgeon: Jerene Bears, MD;  Location: [place];  Service: Gastroenterology;   Laterality: N/A;  . ESOPHAGOGASTRODUODENOSCOPY (EGD) WITH PROPOFOL  08/04/2014   Procedure: ESOPHAGOGASTRODUODENOSCOPY (EGD) WITH PROPOFOL;  Surgeon: Inda Castle, MD;  Location: [place];  Service: Endoscopy;;     OB History   None      Home Medications    Prior to Admission medications   Medication Sig Start Date End Date Taking? Authorizing Provider  acetaminophen (TYLENOL) 325 MG tablet Take 1-2 tablets (325-650 mg total) by mouth every 6 (six) hours as needed (for pain). 10/31/17  Yes Oretha Milch D, MD  amLODipine (NORVASC) 10 MG tablet TAKE 1 TABLET(10 MG) BY MOUTH DAILY 12/08/17  Yes Hoyt Koch, MD  aspirin EC 81 MG tablet Take 1 tablet (81 mg total) by mouth daily. 10/31/17  Yes Oretha Milch D, MD  diclofenac sodium (VOLTAREN) 1 % GEL Apply 2 g topically See admin instructions. Apply 2 grams to both knees for pain four times a day 10/31/17  Yes Oretha Milch D, MD  lisinopril (PRINIVIL,ZESTRIL) 40 MG tablet Take 1 tablet (40 mg total) by mouth daily. 12/08/17  Yes Hoyt Koch, MD  metoprolol tartrate (LOPRESSOR) 50 MG tablet Take 1 tablet (50 mg total) by mouth 2 (two) times daily. 12/08/17  Yes Hoyt Koch, MD  pantoprazole (PROTONIX) 40 MG tablet Take 40 mg by mouth once a day 12/08/17  Yes Hoyt Koch, MD  travoprost, benzalkonium, (TRAVATAN) 0.004 % ophthalmic solution Place 1 drop into both eyes at bedtime. 10/31/17  Yes  Desiree Hane, MD    Family History Family History  Problem Relation Age of Onset  . Coronary artery disease Mother 44       MI    Social History Social History   Tobacco Use  . Smoking status: Never Smoker  . Smokeless tobacco: Never Used  Substance Use Topics  . Alcohol use: No  . Drug use: No     Allergies   Codeine   Review of Systems Review of Systems  All other systems reviewed and are negative.    Physical Exam Updated Vital Signs BP (!) 142/62   Pulse 65   Temp 97.7 F (36.5 C)    Resp 17   Ht 5\' 6"  (1.676 m)   Wt 78.9 kg   SpO2 99%   BMI 28.08 kg/m   Physical Exam  Constitutional: She is oriented to person, place, and time. She appears well-developed and well-nourished.  nad  HENT:  Head: Normocephalic and atraumatic.  Eyes: Conjunctivae are normal.  Neck: Neck supple.  Cardiovascular: Normal rate and regular rhythm.  Pulmonary/Chest: Effort normal and breath sounds normal.  Abdominal: Soft. Bowel sounds are normal.  Musculoskeletal: Normal range of motion.  Neurological: She is alert and oriented to person, place, and time.  Skin: Skin is warm and dry.  Psychiatric: She has a normal mood and affect. Her behavior is normal.  Nursing note and vitals reviewed.    ED Treatments / Results  Labs (all labs ordered are listed, but only abnormal results are displayed) Labs Reviewed  URINE CULTURE - Abnormal; Notable for the following components:      Result Value   Culture MULTIPLE SPECIES PRESENT, SUGGEST RECOLLECTION (*)    All other components within normal limits  BASIC METABOLIC PANEL - Abnormal; Notable for the following components:   GFR calc non Af Amer 55 (*)    All other components within normal limits  CBC - Abnormal; Notable for the following components:   Hemoglobin 11.7 (*)    All other components within normal limits  URINALYSIS, ROUTINE W REFLEX MICROSCOPIC - Abnormal; Notable for the following components:   Color, Urine STRAW (*)    Ketones, ur 5 (*)    All other components within normal limits  I-STAT TROPONIN, ED    EKG EKG Interpretation  Date/Time:  Thursday February 19 2018 08:44:50 EDT Ventricular Rate:  71 PR Interval:    QRS Duration: 102 QT Interval:  414 QTC Calculation: 450 R Axis:   -65 Text Interpretation:  Sinus or ectopic atrial rhythm Prolonged PR interval Incomplete RBBB and LAFB Abnormal R-wave progression, late transition Probable left ventricular hypertrophy Confirmed by Nat Christen [id number]) on 02/19/2018  9:27:51 AM   Radiology Dg Chest 2 View  Result Date: 02/19/2018 CLINICAL DATA:  Chest pain EXAM: CHEST - 2 VIEW COMPARISON:  03/06/2017 FINDINGS: Heart and mediastinal contours are within normal limits. No focal opacities or effusions. No acute bony abnormality. IMPRESSION: No active cardiopulmonary disease. Electronically Signed   By: Rolm Baptise M.D.   On: 02/19/2018 09:52    Procedures Procedures (including critical care time)  Medications Ordered in ED Medications - No data to display   Initial Impression / Assessment and Plan / ED Course  I have reviewed the triage vital signs and the nursing notes.  Pertinent labs & imaging results that were available during my care of the patient were reviewed by me and considered in my medical decision making (see chart for details).  Patient presents with chest pain and questionable hematuria.  Work-up including EKG, chest x-ray, troponin, urinalysis showed no acute findings.  This was discussed with the patient and her son.  Outpatient follow-up.  Final Clinical Impressions(s) / ED Diagnoses   Final diagnoses:  Chest pain, unspecified type    ED Discharge Orders    None       Nat Christen, MD 02/20/18 1228

## 2018-02-20 LAB — URINE CULTURE

## 2018-03-03 ENCOUNTER — Other Ambulatory Visit: Payer: Self-pay | Admitting: Internal Medicine

## 2018-03-23 ENCOUNTER — Ambulatory Visit (INDEPENDENT_AMBULATORY_CARE_PROVIDER_SITE_OTHER): Payer: Medicare Other | Admitting: Internal Medicine

## 2018-03-23 ENCOUNTER — Encounter: Payer: Self-pay | Admitting: Internal Medicine

## 2018-03-23 DIAGNOSIS — S32591D Other specified fracture of right pubis, subsequent encounter for fracture with routine healing: Secondary | ICD-10-CM | POA: Diagnosis not present

## 2018-03-23 DIAGNOSIS — I10 Essential (primary) hypertension: Secondary | ICD-10-CM

## 2018-03-23 DIAGNOSIS — R0789 Other chest pain: Secondary | ICD-10-CM | POA: Diagnosis not present

## 2018-03-23 DIAGNOSIS — M159 Polyosteoarthritis, unspecified: Secondary | ICD-10-CM

## 2018-03-23 DIAGNOSIS — M150 Primary generalized (osteo)arthritis: Secondary | ICD-10-CM

## 2018-03-23 NOTE — Assessment & Plan Note (Signed)
Resolved at this time, no further workup is indicated at this time.

## 2018-03-23 NOTE — Assessment & Plan Note (Signed)
Advised that it is okay to take tylenol and aspirin at the same time.

## 2018-03-23 NOTE — Patient Instructions (Signed)
We do not need labs today.   It is okay to take tylenol for the knee pain if you need it. It is safe to take with the aspirin.

## 2018-03-23 NOTE — Progress Notes (Signed)
   Subjective:    Patient ID: [name], female    DOB: [birth year], 84 y.o.   MRN: [id number]  HPI The patient is a 84 YO female coming in for follow up of chest pain (went to ER for this, denies recurrence since that time, nothing was done for it, it just went away, denies pressure in her chest, not exercising much) and pubic fracture (using walker still at home, denies falls, denies pain at this time, some right knee pain and uses rare tylenol which helps) and blood pressure (BP at goal, denies headaches or chest pains, taking her metoprolol and amlodipine and lisinopril). Eating and sleeping well.   Review of Systems  Constitutional: Negative.   HENT: Negative.   Eyes: Negative.   Respiratory: Negative for cough, chest tightness and shortness of breath.   Cardiovascular: Negative for chest pain, palpitations and leg swelling.  Gastrointestinal: Negative for abdominal distention, abdominal pain, constipation, diarrhea, nausea and vomiting.  Musculoskeletal: Positive for arthralgias.  Skin: Negative.   Neurological: Negative.   Psychiatric/Behavioral: Negative.       Objective:   Physical Exam  Constitutional: She is oriented to person, place, and time. She appears well-developed and well-nourished.  HENT:  Head: Normocephalic and atraumatic.  Eyes: EOM are normal.  Neck: Normal range of motion.  Cardiovascular: Normal rate and regular rhythm.  Pulmonary/Chest: Effort normal and breath sounds normal. No respiratory distress. She has no wheezes. She has no rales.  Abdominal: Soft. Bowel sounds are normal. She exhibits no distension. There is no tenderness. There is no rebound.  Musculoskeletal: She exhibits tenderness. She exhibits no edema.  Neurological: She is alert and oriented to person, place, and time. Coordination abnormal.  Skin: Skin is warm and dry.  Psychiatric: She has a normal mood and affect.   Vitals:   03/23/18 1025  BP: 140/80  Pulse: 72  Temp: 98.8 F  (37.1 C)  TempSrc: Oral  SpO2: 97%  Weight: 173 lb (78.5 kg)  Height: 5\' 6"  (1.676 m)      Assessment & Plan:

## 2018-03-23 NOTE — Assessment & Plan Note (Signed)
Appears to have healed well, denies current pain. Has some mobility issues and using walker successfully.

## 2018-03-23 NOTE — Assessment & Plan Note (Signed)
BP at goal on amlodipine 10, lisinopril 40 mg and metoprolol 50 mg BID. Recent BMP without indication for change. Talked about DASH diet to help as well.

## 2018-03-30 DIAGNOSIS — H401131 Primary open-angle glaucoma, bilateral, mild stage: Secondary | ICD-10-CM | POA: Diagnosis not present

## 2018-05-04 DIAGNOSIS — Z853 Personal history of malignant neoplasm of breast: Secondary | ICD-10-CM | POA: Diagnosis not present

## 2018-05-04 DIAGNOSIS — Z1231 Encounter for screening mammogram for malignant neoplasm of breast: Secondary | ICD-10-CM | POA: Diagnosis not present

## 2018-05-04 LAB — HM MAMMOGRAPHY

## 2018-05-06 ENCOUNTER — Encounter: Payer: Self-pay | Admitting: Internal Medicine

## 2018-05-06 NOTE — Progress Notes (Signed)
Abstracted and sent to scan  

## 2018-05-23 ENCOUNTER — Other Ambulatory Visit: Payer: Self-pay | Admitting: Internal Medicine

## 2018-07-14 ENCOUNTER — Other Ambulatory Visit: Payer: Self-pay | Admitting: Internal Medicine

## 2018-07-27 ENCOUNTER — Ambulatory Visit (INDEPENDENT_AMBULATORY_CARE_PROVIDER_SITE_OTHER): Payer: Medicare Other | Admitting: Internal Medicine

## 2018-07-27 ENCOUNTER — Encounter: Payer: Self-pay | Admitting: Internal Medicine

## 2018-07-27 VITALS — BP 138/92 | HR 67 | Ht 69.0 in | Wt 168.0 lb

## 2018-07-27 DIAGNOSIS — I10 Essential (primary) hypertension: Secondary | ICD-10-CM | POA: Diagnosis not present

## 2018-07-27 DIAGNOSIS — I351 Nonrheumatic aortic (valve) insufficiency: Secondary | ICD-10-CM | POA: Diagnosis not present

## 2018-07-27 DIAGNOSIS — I471 Supraventricular tachycardia: Secondary | ICD-10-CM

## 2018-07-27 NOTE — Progress Notes (Signed)
OFFICE NOTE  Chief Complaint:  Follow-up SVT  Primary Care Physician: Hoyt Koch, MD  HPI:  [name] is a 85 y.o. female with a past medial history significant for SVT which improved with adenosine, hypertension, malnutrition and a history of upper GI bleeding.  I last saw her in 2015 recently she saw Almyra Deforest, PA-C, for tachycardia.  He felt that she might of actually had atrial fibrillation, but I disagree with this.  We discussed the possibility of anticoagulation however given her age and the fact that she does have some falls, she would not be a good candidate for anticoagulation.  Today she reports being asymptomatic.  Her EKG shows sinus rhythm.  She denies any recurrent significant palpitations.  Blood pressure is elevated however her son reports it has been elevated for years and difficult to control.  In the past she been on additional medicine but apparently her primary care provider discontinued 1 of the medicines because she felt unwell with it.  Denies any chest pain or worsening shortness of breath.  07/27/2018  [name] seen today in follow-up.  Overall she is doing well.  Several months ago she was in the ER for chest discomfort which was felt to be atypical although has not had any significant recurrent SVT.  She denies any worsening shortness of breath.  She remains fairly independent and is able to do most of her ADLs.  Blood pressure appears to be well controlled.  She does have a history of mild to moderate AI but no worsening shortness of breath related to that.  PMHx:  Past Medical History:  Diagnosis Date  . Arthritis   . Cancer [facility]) 2011, 2012   left breast cancer 2011 central excision,2012 mastectomy  . Complication of anesthesia    shortness of breath after procedure  . Glaucoma   . Hypertension   . Protein-calorie malnutrition, severe ([place])   . SVT (supraventricular tachycardia) ([place])   . Upper GI bleed 01/2013.    Melena. HPylori  negative gastritis on EGD.     Past Surgical History:  Procedure Laterality Date  . ABDOMINAL HYSTERECTOMY    . BREAST SURGERY  2011  . BREAST SURGERY  2012   lt breast masty  . CHOLECYSTECTOMY    . ESOPHAGOGASTRODUODENOSCOPY N/A 01/25/2013   Procedure: ESOPHAGOGASTRODUODENOSCOPY (EGD);  Surgeon: Jerene Bears, MD;  Location: [facility];  Service: Gastroenterology;  Laterality: N/A;  . ESOPHAGOGASTRODUODENOSCOPY (EGD) WITH PROPOFOL  08/04/2014   Procedure: ESOPHAGOGASTRODUODENOSCOPY (EGD) WITH PROPOFOL;  Surgeon: Inda Castle, MD;  Location: [facility] ENDOSCOPY;  Service: Endoscopy;;    FAMHx:  Family History  Problem Relation Age of Onset  . Coronary artery disease Mother 34       MI    SOCHx:   reports that she has never smoked. She has never used smokeless tobacco. She reports that she does not drink alcohol or use drugs.  ALLERGIES:  Allergies  Allergen Reactions  . Codeine Nausea And Vomiting    ROS: Pertinent items noted in HPI and remainder of comprehensive ROS otherwise negative.  HOME MEDS: Current Outpatient Medications on File Prior to Visit  Medication Sig Dispense Refill  . acetaminophen (TYLENOL) 325 MG tablet Take 1-2 tablets (325-650 mg total) by mouth every 6 (six) hours as needed (for pain).    [name] amLODipine (NORVASC) 10 MG tablet TAKE 1 TABLET(10 MG) BY MOUTH DAILY 90 tablet 3  . aspirin EC 81 MG tablet Take 1 tablet (81  mg total) by mouth daily.    . diclofenac sodium (VOLTAREN) 1 % GEL Apply 2 g topically See admin instructions. Apply 2 grams to both knees for pain four times a day    . diclofenac sodium (VOLTAREN) 1 % GEL APPLY 2 GRAMS EXTERNALLY TO THE AFFECTED AREA FOUR TIMES DAILY 100 g 0  . latanoprost (XALATAN) 0.005 % ophthalmic solution Place 1 drop into both eyes at bedtime.    [name] lisinopril (PRINIVIL,ZESTRIL) 40 MG tablet Take 1 tablet (40 mg total) by mouth daily. 90 tablet 3  . metoprolol tartrate (LOPRESSOR) 50 MG tablet Take 1 tablet (50 mg  total) by mouth 2 (two) times daily. 180 tablet 3  . pantoprazole (PROTONIX) 40 MG tablet Take 40 mg by mouth once a day 90 tablet 3  . travoprost, benzalkonium, (TRAVATAN) 0.004 % ophthalmic solution Place 1 drop into both eyes at bedtime. 2.5 mL 12   No current facility-administered medications on file prior to visit.     LABS/IMAGING: No results found for this or any previous visit (from the past 48 hour(s)). No results found.  LIPID PANEL:    Component Value Date/Time   CHOL 216 (H) 02/09/2014 1107   TRIG 107.0 02/09/2014 1107   HDL 57.60 02/09/2014 1107   CHOLHDL 4 02/09/2014 1107   VLDL 21.4 02/09/2014 1107   LDLCALC 137 (H) 02/09/2014 1107     WEIGHTS: Wt Readings from Last 3 Encounters:  07/27/18 168 lb (76.2 kg)  03/23/18 173 lb (78.5 kg)  02/19/18 174 lb (78.9 kg)    VITALS: BP (!) 138/92   Pulse 67   Ht 5\' 9"  (1.753 m)   Wt 168 lb (76.2 kg)   BMI 24.81 kg/m   EXAM: General appearance: alert and no distress Neck: no carotid bruit, no JVD and thyroid not enlarged, symmetric, no tenderness/mass/nodules Lungs: clear to auscultation bilaterally Heart: regular rate and rhythm, S1, S2 normal, no murmur, click, rub or gallop Abdomen: soft, non-tender; bowel sounds normal; no masses,  no organomegaly Extremities: extremities normal, atraumatic, no cyanosis or edema Pulses: 2+ and symmetric Skin: Skin color, texture, turgor normal. No rashes or lesions Neurologic: Grossly normal Psych: [place]  EKG:  Sinus rhythm first-degree AV block at 67, LAFB, nonspecific ST changes-personally reviewed  ASSESSMENT: 1. History of SVT-no recurrence 2. Hypertension-uncontrolled 3. History of mild to moderate aortic insufficiency  PLAN: 1.   [name] has not had any significant anginal pain or recurrent SVT of significance.  Her blood pressure appears to be well controlled at this point.  She does have mild to moderate AI.  She has been asymptomatic with this and I think  there is little utility in repeat echo if she is asymptomatic as she is not likely to be valve surgery candidate.  Plan follow-up with me annually or sooner as necessary.  Pixie Casino, MD, [facility], [place] Director of the Advanced Lipid Disorders &  Cardiovascular Risk Reduction Clinic Diplomate of the American Board of Clinical Lipidology Attending Cardiologist  Direct Dial: [phone]  Fax: [phone]  Website:  [email or web address] 07/27/2018, 9:57 AM

## 2018-07-27 NOTE — Patient Instructions (Signed)
Medication Instructions:  Your physician recommends that you continue on your current medications as directed. Please refer to the Current Medication list given to you today.  If you need a refill on your cardiac medications before your next appointment, please call your pharmacy.   Lab work: NONE If you have labs (blood work) drawn today and your tests are completely normal, you will receive your results only by: [name] MyChart Message (if you have MyChart) OR . A paper copy in the mail If you have any lab test that is abnormal or we need to change your treatment, we will call you to review the results.  Testing/Procedures: NONE  Follow-Up: At [facility], you and your health needs are our priority.  As part of our continuing mission to provide you with exceptional heart care, we have created designated Provider Care Teams.  These Care Teams include your primary Cardiologist (physician) and Advanced Practice Providers (APPs -  Physician Assistants and Nurse Practitioners) who all work together to provide you with the care you need, when you need it. You will need a follow up appointment in 12 months with Dr. Debara Pickett.  Please call our office 2 months in advance to schedule this appointment.

## 2018-07-29 NOTE — Addendum Note (Signed)
Addended by: Ulice Brilliant T on: 07/29/2018 11:18 AM   Modules accepted: Orders

## 2018-08-03 DIAGNOSIS — H401131 Primary open-angle glaucoma, bilateral, mild stage: Secondary | ICD-10-CM | POA: Diagnosis not present

## 2018-08-20 ENCOUNTER — Ambulatory Visit: Payer: Medicare Other

## 2018-08-24 ENCOUNTER — Ambulatory Visit: Payer: Medicare Other

## 2018-08-31 ENCOUNTER — Other Ambulatory Visit: Payer: Self-pay | Admitting: Internal Medicine

## 2018-11-09 DIAGNOSIS — H401131 Primary open-angle glaucoma, bilateral, mild stage: Secondary | ICD-10-CM | POA: Diagnosis not present

## 2018-11-16 ENCOUNTER — Other Ambulatory Visit: Payer: Self-pay | Admitting: Internal Medicine

## 2018-11-22 ENCOUNTER — Other Ambulatory Visit: Payer: Self-pay | Admitting: Internal Medicine

## 2018-12-01 ENCOUNTER — Other Ambulatory Visit: Payer: Self-pay | Admitting: Internal Medicine

## 2018-12-30 ENCOUNTER — Other Ambulatory Visit: Payer: Self-pay | Admitting: Internal Medicine

## 2019-01-06 ENCOUNTER — Emergency Department (HOSPITAL_COMMUNITY)
Admission: EM | Admit: 2019-01-06 | Discharge: 2019-01-06 | Disposition: A | Discharge status: Home / Self Care | Payer: Medicare Other | Attending: Emergency Medicine | Admitting: Emergency Medicine

## 2019-01-06 ENCOUNTER — Other Ambulatory Visit: Payer: Self-pay

## 2019-01-06 ENCOUNTER — Encounter (HOSPITAL_COMMUNITY): Payer: Self-pay | Admitting: Emergency Medicine

## 2019-01-06 DIAGNOSIS — I10 Essential (primary) hypertension: Secondary | ICD-10-CM | POA: Insufficient documentation

## 2019-01-06 DIAGNOSIS — K625 Hemorrhage of anus and rectum: Secondary | ICD-10-CM | POA: Diagnosis not present

## 2019-01-06 DIAGNOSIS — Z853 Personal history of malignant neoplasm of breast: Secondary | ICD-10-CM | POA: Diagnosis not present

## 2019-01-06 LAB — COMPREHENSIVE METABOLIC PANEL
ALT: 8 U/L (ref 0–44)
AST: 17 U/L (ref 15–41)
Albumin: 3.5 g/dL (ref 3.5–5.0)
Alkaline Phosphatase: 102 U/L (ref 38–126)
Anion gap: 10 (ref 5–15)
BUN: 19 mg/dL (ref 8–23)
CO2: 23 mmol/L (ref 22–32)
Calcium: 9.2 mg/dL (ref 8.9–10.3)
Chloride: 106 mmol/L (ref 98–111)
Creatinine, Ser: 0.98 mg/dL (ref 0.44–1.00)
GFR calc Af Amer: 57 mL/min — ABNORMAL LOW (ref >60)
GFR calc non Af Amer: 49 mL/min — ABNORMAL LOW (ref >60)
Glucose, Bld: 162 mg/dL — ABNORMAL HIGH (ref 70–99)
Potassium: 4.4 mmol/L (ref 3.5–5.1)
Sodium: 139 mmol/L (ref 135–145)
Total Bilirubin: 0.8 mg/dL (ref 0.3–1.2)
Total Protein: 7.1 g/dL (ref 6.5–8.1)

## 2019-01-06 LAB — TYPE AND SCREEN
ABO/RH(D): B POS
Antibody Screen: NEGATIVE

## 2019-01-06 LAB — CBC
HCT: 42.1 % (ref 36.0–46.0)
Hemoglobin: 13.4 g/dL (ref 12.0–15.0)
MCH: 27.1 pg (ref 26.0–34.0)
MCHC: 31.8 g/dL (ref 30.0–36.0)
MCV: 85.2 fL (ref 80.0–100.0)
Platelets: 457 10*3/uL — ABNORMAL HIGH (ref 150–400)
RBC: 4.94 MIL/uL (ref 3.87–5.11)
RDW: 15 % (ref 11.5–15.5)
WBC: 6.9 10*3/uL (ref 4.0–10.5)
nRBC: 0 % (ref 0.0–0.2)

## 2019-01-06 NOTE — ED Provider Notes (Signed)
Emergency Department Provider Note   I have reviewed the triage vital signs and the nursing notes.   HISTORY  Chief Complaint Rectal Bleeding   HPI [name] is a 85 y.o. female who presents to the emergency department today for rectal bleeding.  Her daughter helps supplement the story.  Patient states that she has been in her normal state of health and then this evening she had a stool with little bit of bright red blood in it and then couple hours later she had a large bowel movement that was maroon-colored.  She not had any weakness, fatigue, dyspnea, skin color changes or other associated symptoms.  She is unsure when her last colonoscopy was but it is likely been many years secondary to her age.  Patient without any abdominal pain.  She is had a GI bleed in the past secondary diverticulosis.  She does have atrial fibrillation but is not on any anticoagulants just low-dose aspirin.   No other associated or modifying symptoms.    Past Medical History:  Diagnosis Date  . Arthritis   . Cancer [facility]) 2011, 2012   left breast cancer 2011 central excision,2012 mastectomy  . Complication of anesthesia    shortness of breath after procedure  . Glaucoma   . Hypertension   . Protein-calorie malnutrition, severe ([place])   . SVT (supraventricular tachycardia) ([place])   . Upper GI bleed 01/2013.    Melena. HPylori negative gastritis on EGD.     Patient Active Problem List   Diagnosis Date Noted  . Blood in stool 01/07/2019  . Other chest pain 03/23/2018  . Pubic ramus fracture ([place]) 10/28/2017  . Aortic valve regurgitation 07/01/2017  . Debilitated 08/10/2014  . SVT (supraventricular tachycardia) ([place]) 02/23/2014  . DJD (degenerative joint disease) 04/07/2013  . Breast cancer- s/p mastectomy 03/11/2013  . GERD 03/10/2008  . GLAUCOMA 09/30/2007  . Hypertension 09/30/2007    Past Surgical History:  Procedure Laterality Date  . ABDOMINAL HYSTERECTOMY    . BREAST SURGERY   2011  . BREAST SURGERY  2012   lt breast masty  . CHOLECYSTECTOMY    . ESOPHAGOGASTRODUODENOSCOPY N/A 01/25/2013   Procedure: ESOPHAGOGASTRODUODENOSCOPY (EGD);  Surgeon: Jerene Bears, MD;  Location: [address];  Service: Gastroenterology;  Laterality: N/A;  . ESOPHAGOGASTRODUODENOSCOPY (EGD) WITH PROPOFOL  08/04/2014   Procedure: ESOPHAGOGASTRODUODENOSCOPY (EGD) WITH PROPOFOL;  Surgeon: Inda Castle, MD;  Location: [facility] ENDOSCOPY;  Service: Endoscopy;;    Current Outpatient Rx  . Order #: [id number] Class: No Print  . Order #: [id number] Class: Normal  . Order #: [id number] Class: No Print  . Order #: [id number] Class: No Print  . Order #: [id number] Class: Normal  . Order #: [id number] Class: Historical Med  . Order #: [id number] Class: Normal  . Order #: [id number] Class: Normal  . Order #: [id number] Class: Normal  . Order #: [id number] Class: No Print    Allergies Codeine  Family History  Problem Relation Age of Onset  . Coronary artery disease Mother 72       MI    Social History Social History   Tobacco Use  . Smoking status: Never Smoker  . Smokeless tobacco: Never Used  Substance Use Topics  . Alcohol use: No  . Drug use: No    Review of Systems  All other systems negative except as documented in the HPI. All pertinent positives and negatives as reviewed in the HPI. ____________________________________________   PHYSICAL EXAM:  VITAL SIGNS: ED Triage Vitals  Enc Vitals Group  BP 01/06/19 1855 133/89     Pulse Rate 01/06/19 1855 (!) 126     Resp 01/06/19 1855 16     Temp 01/06/19 1855 97.7 F (36.5 C)     Temp Source 01/06/19 1855 Oral     SpO2 01/06/19 1855 97 %     Weight --      Height --      Head Circumference --      Peak Flow --      Pain Score 01/06/19 1911 0     Pain Loc --      Pain Edu? --      Excl. in [place]? --     Constitutional: Alert and oriented. Well appearing and in no acute distress. Eyes: Conjunctivae are normal. PERRL. EOMI. Head:  Atraumatic. Nose: No congestion/rhinnorhea. Mouth/Throat: Mucous membranes are moist.  Oropharynx non-erythematous. Neck: No stridor.  No meningeal signs.   Cardiovascular: Normal rate, regular rhythm. Good peripheral circulation. Grossly normal heart sounds.   Respiratory: Normal respiratory effort.  No retractions. Lungs CTAB. Gastrointestinal: Soft and nontender. No distention.  Musculoskeletal: No lower extremity tenderness nor edema. No gross deformities of extremities. Neurologic:  Normal speech and language. No gross focal neurologic deficits are appreciated.  Skin:  Skin is warm, dry and intact. No rash noted.   ____________________________________________   LABS (all labs ordered are listed, but only abnormal results are displayed)  Labs Reviewed  COMPREHENSIVE METABOLIC PANEL - Abnormal; Notable for the following components:      Result Value   Glucose, Bld 162 (*)    GFR calc non Af Amer 49 (*)    GFR calc Af Amer 57 (*)    All other components within normal limits  CBC - Abnormal; Notable for the following components:   Platelets 457 (*)    All other components within normal limits  TYPE AND SCREEN   ____________________________________________  RADIOLOGY  No results found.  ____________________________________________   PROCEDURES  Procedure(s) performed:   Procedures   ____________________________________________   INITIAL IMPRESSION / ASSESSMENT AND PLAN / ED COURSE  Initial heart rate is charted as 126 however patient without any tachycardia on my exam.  Her heart rate was consistently in the 60s and 70s.  Nurse also stated that she had not seen a heart rate above 78.  Patient without any symptoms consistent with a acute blood loss anemia.  Her abdomen is not tender.  Likely diverticular bleed that she is had in the past will observe and check labs but hopefully patient can be discharged if she remains stable.  Stable with a GI bleed and no bowel  movements in ER with 4 hour observation. Asymptomatic. Orthostatics normal without symptoms. Plan to follow up with PCP/GI for further management. Described return precuations.     Pertinent labs & imaging results that were available during my care of the patient were reviewed by me and considered in my medical decision making (see chart for details).   A medical screening exam was performed and I feel the patient has had an appropriate workup for their chief complaint at this time and likelihood of emergent condition existing is low. They have been counseled on decision, discharge, follow up and which symptoms necessitate immediate return to the emergency department. They or their family verbally stated understanding and agreement with plan and discharged in stable condition.   ____________________________________________  FINAL CLINICAL IMPRESSION(S) / ED DIAGNOSES  Final diagnoses:  Rectal bleeding     MEDICATIONS GIVEN  DURING THIS VISIT:  Medications - No data to display   NEW OUTPATIENT MEDICATIONS STARTED DURING THIS VISIT:  Discharge Medication List as of 01/06/2019 10:28 PM      Note:  This note was prepared with assistance of Dragon voice recognition software. Occasional wrong-word or sound-a-like substitutions may have occurred due to the inherent limitations of voice recognition software.   Mazy Culton, Corene Cornea, MD 01/08/19 [phone]

## 2019-01-06 NOTE — ED Notes (Signed)
Pt. Denies dizziness with position changes

## 2019-01-06 NOTE — ED Triage Notes (Signed)
Pt from home c/o rectal bleed. Pt states 1 large dark bowel movement today. Pt denies GI HX. Pt denies recent GI procedures.

## 2019-01-06 NOTE — ED Notes (Signed)
3 gold tops and 1 blue top sent with blood work

## 2019-01-07 ENCOUNTER — Other Ambulatory Visit (INDEPENDENT_AMBULATORY_CARE_PROVIDER_SITE_OTHER): Payer: Medicare Other

## 2019-01-07 ENCOUNTER — Ambulatory Visit (INDEPENDENT_AMBULATORY_CARE_PROVIDER_SITE_OTHER): Payer: Medicare Other | Admitting: Internal Medicine

## 2019-01-07 ENCOUNTER — Other Ambulatory Visit: Payer: Self-pay | Admitting: Internal Medicine

## 2019-01-07 ENCOUNTER — Encounter: Payer: Self-pay | Admitting: Internal Medicine

## 2019-01-07 VITALS — BP 130/82 | HR 72 | Temp 97.5°F | Ht 69.0 in | Wt 171.0 lb

## 2019-01-07 DIAGNOSIS — K921 Melena: Secondary | ICD-10-CM

## 2019-01-07 LAB — FERRITIN: Ferritin: 28.3 ng/mL (ref 10.0–291.0)

## 2019-01-07 LAB — CBC
HCT: 38.5 % (ref 36.0–46.0)
Hemoglobin: 12.5 g/dL (ref 12.0–15.0)
MCHC: 32.4 g/dL (ref 30.0–36.0)
MCV: 84.3 fl (ref 78.0–100.0)
Platelets: 420 10*3/uL — ABNORMAL HIGH (ref 150.0–400.0)
RBC: 4.57 Mil/uL (ref 3.87–5.11)
RDW: 15.5 % (ref 11.5–15.5)
WBC: 7.6 10*3/uL (ref 4.0–10.5)

## 2019-01-07 NOTE — Patient Instructions (Signed)
We will have you stop the aspirin for about 1-2 weeks.   We are checking the blood counts today and may need to check them again tomorrow.   We will call back with the results.  If you feel lightheaded, like you may pass out (fall out) or getting chest pains go to the ER.

## 2019-01-07 NOTE — Progress Notes (Signed)
   Subjective:   Patient ID: [name], female    DOB: 01/13/1922, 85 y.o.   MRN: [id number]  HPI The patient is a 85 YO female coming in for ER follow up (in for GI bleeding and blood in stool, prior diverticular bleed, CBC was stable and she was sent home). She has continued with blood and clots in stool. She has gone once today and there was not quite as much blood as last night. She has not had blood on her underwear. Denies lightheadedness or dizziness. Denies syncope or pre-syncope. Denies chest pains or palpitations. Denies change in SOB. Denies fevers or chills or cough. Denies abdominal pain, nausea or vomiting.  PMH, [facility], social history reviewed and updated.   Review of Systems  Constitutional: Negative.   HENT: Negative.   Eyes: Negative.   Respiratory: Negative for cough, chest tightness and shortness of breath.   Cardiovascular: Negative for chest pain, palpitations and leg swelling.  Gastrointestinal: Positive for anal bleeding and blood in stool. Negative for abdominal distention, abdominal pain, constipation, diarrhea, nausea, rectal pain and vomiting.  Musculoskeletal: Negative.   Skin: Negative.   Neurological: Negative.   Psychiatric/Behavioral: Negative.     Objective:  Physical Exam Constitutional:      Appearance: She is well-developed.  HENT:     Head: Normocephalic and atraumatic.  Neck:     Musculoskeletal: Normal range of motion.  Cardiovascular:     Rate and Rhythm: Normal rate and regular rhythm.  Pulmonary:     Effort: Pulmonary effort is normal. No respiratory distress.     Breath sounds: Normal breath sounds. No wheezing or rales.  Abdominal:     General: Bowel sounds are normal. There is no distension.     Palpations: Abdomen is soft.     Tenderness: There is no abdominal tenderness. There is no rebound.  Skin:    General: Skin is warm and dry.  Neurological:     Mental Status: She is alert and oriented to person, place, and time.   Coordination: Coordination abnormal.     Comments: Walker, stable     Vitals:   01/07/19 1055  BP: 130/82  Pulse: 72  Temp: (!) 97.5 F (36.4 C)  TempSrc: Oral  SpO2: 97%  Weight: 171 lb (77.6 kg)  Height: 5\' 9"  (1.753 m)    Assessment & Plan:

## 2019-01-07 NOTE — Assessment & Plan Note (Addendum)
Taking protonix without missing making upper GI bleed less likely. She is having blood and clots in stool and likely from diverticular source. Checking CBC and will need close monitoring of this. If significant drop may need return to ER. If any dizziness, chest pains, SOB, syncope or pre-syncope needs return to ER. They will monitor for blood and if worsening or increasing will call back office. Advised to hold aspirin for 1 week.

## 2019-01-08 ENCOUNTER — Other Ambulatory Visit (INDEPENDENT_AMBULATORY_CARE_PROVIDER_SITE_OTHER): Payer: Medicare Other

## 2019-01-08 ENCOUNTER — Other Ambulatory Visit: Payer: Medicare Other

## 2019-01-08 DIAGNOSIS — K921 Melena: Secondary | ICD-10-CM

## 2019-01-08 LAB — CBC
HCT: 38.4 % (ref 36.0–46.0)
Hemoglobin: 12.4 g/dL (ref 12.0–15.0)
MCHC: 32.2 g/dL (ref 30.0–36.0)
MCV: 84.8 fl (ref 78.0–100.0)
Platelets: 444 10*3/uL — ABNORMAL HIGH (ref 150.0–400.0)
RBC: 4.53 Mil/uL (ref 3.87–5.11)
RDW: 15.4 % (ref 11.5–15.5)
WBC: 7.9 10*3/uL (ref 4.0–10.5)

## 2019-03-22 DIAGNOSIS — Z961 Presence of intraocular lens: Secondary | ICD-10-CM | POA: Diagnosis not present

## 2019-03-22 DIAGNOSIS — H401131 Primary open-angle glaucoma, bilateral, mild stage: Secondary | ICD-10-CM | POA: Diagnosis not present

## 2019-03-22 DIAGNOSIS — H353131 Nonexudative age-related macular degeneration, bilateral, early dry stage: Secondary | ICD-10-CM | POA: Diagnosis not present

## 2019-05-03 DIAGNOSIS — M6281 Muscle weakness (generalized): Secondary | ICD-10-CM | POA: Diagnosis not present

## 2019-05-03 DIAGNOSIS — Z9181 History of falling: Secondary | ICD-10-CM | POA: Diagnosis not present

## 2019-05-03 DIAGNOSIS — R2689 Other abnormalities of gait and mobility: Secondary | ICD-10-CM | POA: Diagnosis not present

## 2019-05-10 DIAGNOSIS — Z853 Personal history of malignant neoplasm of breast: Secondary | ICD-10-CM | POA: Diagnosis not present

## 2019-05-10 DIAGNOSIS — Z1231 Encounter for screening mammogram for malignant neoplasm of breast: Secondary | ICD-10-CM | POA: Diagnosis not present

## 2019-05-10 LAB — HM MAMMOGRAPHY

## 2019-05-13 ENCOUNTER — Other Ambulatory Visit: Payer: Self-pay | Admitting: Internal Medicine

## 2019-05-17 ENCOUNTER — Ambulatory Visit (INDEPENDENT_AMBULATORY_CARE_PROVIDER_SITE_OTHER): Payer: Medicare Other | Admitting: Internal Medicine

## 2019-05-17 ENCOUNTER — Other Ambulatory Visit: Payer: Self-pay

## 2019-05-17 ENCOUNTER — Encounter: Payer: Self-pay | Admitting: Internal Medicine

## 2019-05-17 VITALS — BP 140/76 | HR 109 | Ht 69.0 in | Wt 171.2 lb

## 2019-05-17 DIAGNOSIS — I471 Supraventricular tachycardia: Secondary | ICD-10-CM | POA: Diagnosis not present

## 2019-05-17 DIAGNOSIS — I351 Nonrheumatic aortic (valve) insufficiency: Secondary | ICD-10-CM | POA: Diagnosis not present

## 2019-05-17 DIAGNOSIS — I10 Essential (primary) hypertension: Secondary | ICD-10-CM

## 2019-05-17 NOTE — Patient Instructions (Signed)
Medication Instructions:  Your physician recommends that you continue on your current medications as directed. Please refer to the Current Medication list given to you today.  *If you need a refill on your cardiac medications before your next appointment, please call your pharmacy*   Follow-Up: At [facility], you and your health needs are our priority.  As part of our continuing mission to provide you with exceptional heart care, we have created designated Provider Care Teams.  These Care Teams include your primary Cardiologist (physician) and Advanced Practice Providers (APPs -  Physician Assistants and Nurse Practitioners) who all work together to provide you with the care you need, when you need it.  Your next appointment:   12 month(s)  The format for your next appointment:   In Person  Provider:   K. Mali Hilty, MD  Other Instructions

## 2019-05-17 NOTE — Progress Notes (Signed)
OFFICE NOTE  Chief Complaint:  Follow-up  Primary Care Physician: Hoyt Koch, MD  HPI:  [name] is a 85 y.o. female with a past medial history significant for SVT which improved with adenosine, hypertension, malnutrition and a history of upper GI bleeding.  I last saw her in 2015 recently she saw Almyra Deforest, PA-C, for tachycardia.  He felt that she might of actually had atrial fibrillation, but I disagree with this.  We discussed the possibility of anticoagulation however given her age and the fact that she does have some falls, she would not be a good candidate for anticoagulation.  Today she reports being asymptomatic.  Her EKG shows sinus rhythm.  She denies any recurrent significant palpitations.  Blood pressure is elevated however her son reports it has been elevated for years and difficult to control.  In the past she been on additional medicine but apparently her primary care provider discontinued 1 of the medicines because she felt unwell with it.  Denies any chest pain or worsening shortness of breath.  07/27/2018  Mrs. [name] seen today in follow-up.  Overall she is doing well.  Several months ago she was in the ER for chest discomfort which was felt to be atypical although has not had any significant recurrent SVT.  She denies any worsening shortness of breath.  She remains fairly independent and is able to do most of her ADLs.  Blood pressure appears to be well controlled.  She does have a history of mild to moderate AI but no worsening shortness of breath related to that.  05/17/2019  Mrs. [name] is seen today in follow-up and is accompanied by her son.  She just had a recent birthday and turned 89.  She denies any chest pain or worsening shortness of breath.  Although her heart rate was increased today in a sinus tachycardia, she had just done a lot of walking and maneuvering and after that her heart rate did improve on auscultation.  Blood pressure was top normal  today.  She did take her medicines.  He denies any SVT.  PMHx:  Past Medical History:  Diagnosis Date  . Arthritis   . Cancer [facility]) 2011, 2012   left breast cancer 2011 central excision,2012 mastectomy  . Complication of anesthesia    shortness of breath after procedure  . Glaucoma   . Hypertension   . Protein-calorie malnutrition, severe ([place])   . SVT (supraventricular tachycardia) ([place])   . Upper GI bleed 01/2013.    Melena. HPylori negative gastritis on EGD.     Past Surgical History:  Procedure Laterality Date  . ABDOMINAL HYSTERECTOMY    . BREAST SURGERY  2011  . BREAST SURGERY  2012   lt breast masty  . CHOLECYSTECTOMY    . ESOPHAGOGASTRODUODENOSCOPY N/A 01/25/2013   Procedure: ESOPHAGOGASTRODUODENOSCOPY (EGD);  Surgeon: Jerene Bears, MD;  Location: [place];  Service: Gastroenterology;  Laterality: N/A;  . ESOPHAGOGASTRODUODENOSCOPY (EGD) WITH PROPOFOL  08/04/2014   Procedure: ESOPHAGOGASTRODUODENOSCOPY (EGD) WITH PROPOFOL;  Surgeon: Inda Castle, MD;  Location[facility] ENDOSCOPY;  Service: Endoscopy;;    FAMHx:  Family History  Problem Relation Age of Onset  . Coronary artery disease Mother 96       MI    SOCHx:   reports that she has never smoked. She has never used smokeless tobacco. She reports that she does not drink alcohol or use drugs.  ALLERGIES:  Allergies  Allergen Reactions  . Codeine Nausea And  Vomiting    ROS: Pertinent items noted in HPI and remainder of comprehensive ROS otherwise negative.  HOME MEDS: Current Outpatient Medications on File Prior to Visit  Medication Sig Dispense Refill  . acetaminophen (TYLENOL) 325 MG tablet Take 1-2 tablets (325-650 mg total) by mouth every 6 (six) hours as needed (for pain).    [name] amLODipine (NORVASC) 10 MG tablet TAKE 1 TABLET(10 MG) BY MOUTH DAILY 90 tablet 1  . aspirin EC 81 MG tablet Take 1 tablet (81 mg total) by mouth daily.    . diclofenac sodium (VOLTAREN) 1 % GEL Apply 2 g topically See admin  instructions. Apply 2 grams to both knees for pain four times a day    . diclofenac sodium (VOLTAREN) 1 % GEL APPLY 2 GRAMS EXTERNALLY TO THE AFFECTED AREA FOUR TIMES DAILY 100 g 1  . latanoprost (XALATAN) 0.005 % ophthalmic solution Place 1 drop into both eyes at bedtime.    [name] lisinopril (ZESTRIL) 40 MG tablet TAKE 1 TABLET(40 MG) BY MOUTH DAILY 90 tablet 0  . metoprolol tartrate (LOPRESSOR) 50 MG tablet TAKE 1 TABLET(50 MG) BY MOUTH TWICE DAILY 180 tablet 1  . pantoprazole (PROTONIX) 40 MG tablet TAKE 1 TABLET BY MOUTH EVERY DAY 90 tablet 1  . travoprost, benzalkonium, (TRAVATAN) 0.004 % ophthalmic solution Place 1 drop into both eyes at bedtime. 2.5 mL 12   No current facility-administered medications on file prior to visit.    LABS/IMAGING: No results found for this or any previous visit (from the past 48 hour(s)). No results found.  LIPID PANEL:    Component Value Date/Time   CHOL 216 (H) 02/09/2014 1107   TRIG 107.0 02/09/2014 1107   HDL 57.60 02/09/2014 1107   CHOLHDL 4 02/09/2014 1107   VLDL 21.4 02/09/2014 1107   LDLCALC 137 (H) 02/09/2014 1107     WEIGHTS: Wt Readings from Last 3 Encounters:  05/17/19 171 lb 3.2 oz (77.7 kg)  01/07/19 171 lb (77.6 kg)  07/27/18 168 lb (76.2 kg)    VITALS: BP 140/76   Pulse (!) 109   Ht 5\' 9"  (1.753 m)   Wt 171 lb 3.2 oz (77.7 kg)   SpO2 96%   BMI 25.28 kg/m   EXAM: General appearance: alert and no distress Neck: no carotid bruit, no JVD and thyroid not enlarged, symmetric, no tenderness/mass/nodules Lungs: clear to auscultation bilaterally Heart: regular rate and rhythm, S1, S2 normal, no murmur, click, rub or gallop Abdomen: soft, non-tender; bowel sounds normal; no masses,  no organomegaly Extremities: extremities normal, atraumatic, no cyanosis or edema Pulses: 2+ and symmetric Skin: Skin color, texture, turgor normal. No rashes or lesions Neurologic: Grossly normal Psych: Pleasant  EKG:  Sinus tachycardia  first-degree AV block, bifascicular block, moderate criteria for LVH at 109-personally reviewed  ASSESSMENT: 1. History of SVT-no recurrence 2. Hypertension 3. History of mild to moderate aortic insufficiency  PLAN: 1.   Mrs. Branco seems to be doing well.  She denies any recurrent SVT.  She had a sinus tachycardia today however that improved after resting.  Blood pressure was top normal.  He does have a history of mild to moderate AI by echo in 2016 however it discussion with her son they are not likely to do anything about it surgically therefore we will not plan to do any echo follow-up.  Continue medical management.  Follow-up annually or sooner as necessary.  Pixie Casino, MD, [facility], [place] Director  of the Advanced Lipid Disorders &  Cardiovascular Risk Reduction Clinic Diplomate of the American Board of Clinical Lipidology Attending Cardiologist  Direct Dial: [phone]  Fax: [phone]  Website:  [email or web address] 05/17/2019, 2:50 PM

## 2019-05-18 ENCOUNTER — Other Ambulatory Visit: Payer: Self-pay | Admitting: Internal Medicine

## 2019-06-03 ENCOUNTER — Other Ambulatory Visit: Payer: Self-pay | Admitting: Internal Medicine

## 2019-06-21 ENCOUNTER — Other Ambulatory Visit: Payer: Self-pay | Admitting: Internal Medicine

## 2019-06-28 ENCOUNTER — Ambulatory Visit: Payer: Medicare Other | Admitting: Internal Medicine

## 2019-06-29 ENCOUNTER — Ambulatory Visit (INDEPENDENT_AMBULATORY_CARE_PROVIDER_SITE_OTHER): Payer: Medicare Other | Admitting: Internal Medicine

## 2019-06-29 ENCOUNTER — Other Ambulatory Visit: Payer: Self-pay

## 2019-06-29 ENCOUNTER — Encounter: Payer: Self-pay | Admitting: Internal Medicine

## 2019-06-29 VITALS — BP 132/86 | HR 103 | Temp 98.2°F | Ht 69.0 in | Wt 169.0 lb

## 2019-06-29 DIAGNOSIS — Z0000 Encounter for general adult medical examination without abnormal findings: Secondary | ICD-10-CM | POA: Diagnosis not present

## 2019-06-29 DIAGNOSIS — M159 Polyosteoarthritis, unspecified: Secondary | ICD-10-CM

## 2019-06-29 DIAGNOSIS — M8949 Other hypertrophic osteoarthropathy, multiple sites: Secondary | ICD-10-CM | POA: Diagnosis not present

## 2019-06-29 DIAGNOSIS — K219 Gastro-esophageal reflux disease without esophagitis: Secondary | ICD-10-CM

## 2019-06-29 DIAGNOSIS — I10 Essential (primary) hypertension: Secondary | ICD-10-CM

## 2019-06-29 LAB — COMPREHENSIVE METABOLIC PANEL
ALT: 6 U/L (ref 0–35)
AST: 13 U/L (ref 0–37)
Albumin: 3.9 g/dL (ref 3.5–5.2)
Alkaline Phosphatase: 104 U/L (ref 39–117)
BUN: 23 mg/dL (ref 6–23)
CO2: 26 mEq/L (ref 19–32)
Calcium: 9.4 mg/dL (ref 8.4–10.5)
Chloride: 106 mEq/L (ref 96–112)
Creatinine, Ser: 0.93 mg/dL (ref 0.40–1.20)
GFR: 67.73 mL/min (ref >60.00)
Glucose, Bld: 89 mg/dL (ref 70–99)
Potassium: 4.4 mEq/L (ref 3.5–5.1)
Sodium: 140 mEq/L (ref 135–145)
Total Bilirubin: 0.5 mg/dL (ref 0.2–1.2)
Total Protein: 7.2 g/dL (ref 6.0–8.3)

## 2019-06-29 LAB — LIPID PANEL
Cholesterol: 213 mg/dL — ABNORMAL HIGH (ref 0–200)
HDL: 58.3 mg/dL (ref >39.00)
LDL Cholesterol: 135 mg/dL — ABNORMAL HIGH (ref 0–99)
NonHDL: 154.49
Total CHOL/HDL Ratio: 4
Triglycerides: 97 mg/dL (ref 0.0–149.0)
VLDL: 19.4 mg/dL (ref 0.0–40.0)

## 2019-06-29 LAB — CBC
HCT: 37 % (ref 36.0–46.0)
Hemoglobin: 11.9 g/dL — ABNORMAL LOW (ref 12.0–15.0)
MCHC: 32.1 g/dL (ref 30.0–36.0)
MCV: 80.1 fl (ref 78.0–100.0)
Platelets: 470 10*3/uL — ABNORMAL HIGH (ref 150.0–400.0)
RBC: 4.62 Mil/uL (ref 3.87–5.11)
RDW: 15.7 % — ABNORMAL HIGH (ref 11.5–15.5)
WBC: 5.7 10*3/uL (ref 4.0–10.5)

## 2019-06-29 MED ORDER — METOPROLOL TARTRATE 50 MG PO TABS: 50.0000 mg | ORAL_TABLET | Freq: Two times a day (BID) | ORAL | 3 refills | Status: DC

## 2019-06-29 MED ORDER — AMLODIPINE BESYLATE 10 MG PO TABS: 10.0000 mg | ORAL_TABLET | Freq: Every day | ORAL | 3 refills | Status: DC

## 2019-06-29 MED ORDER — PANTOPRAZOLE SODIUM 40 MG PO TBEC: DELAYED_RELEASE_TABLET | ORAL | 3 refills | Status: DC

## 2019-06-29 MED ORDER — LISINOPRIL 40 MG PO TABS: 40.0000 mg | ORAL_TABLET | Freq: Every day | ORAL | 3 refills | Status: DC

## 2019-06-29 NOTE — Assessment & Plan Note (Signed)
Using tylenol and voltaren gel only. No oral NSAIDs due to past GI bleeding.

## 2019-06-29 NOTE — Assessment & Plan Note (Signed)
Flu shot declines. Pneumonia complete. Shingrix declines. Tetanus declines. Colonoscopy aged out. Mammogram aged out, pap smear aged out and dexa declines. Counseled about sun safety and mole surveillance. Counseled about the dangers of distracted driving. Given 10 year screening recommendations.

## 2019-06-29 NOTE — Progress Notes (Signed)
Subjective:   Patient ID: [name], female    DOB: [birth year], 86 y.o.   MRN: [id number]  HPI Here for medicare wellness, no new complaints. Please see A/P for status and treatment of chronic medical problems.   HPI #2: Here for follow up blood pressure (taking amlodipine, lisinopril, metoprolol, denies side effects, denies chest pains or headaches) and GERD (taking protonix, denies breakthrough symptoms, denies further blood in stool), and arthritis (reminded to take tylenol only or use topical voltaren given past GI bleeding, denies taking nsaids otc).   Diet: heart healthy Physical activity: sedentary Depression/mood screen: negative Hearing: intact to whispered voice Visual acuity: grossly normal, performs annual eye exam  ADLs: capable Fall risk: low Home safety: good Cognitive evaluation: intact to orientation, naming, recall and repetition EOL planning: adv directives discussed    Office Visit from 01/07/2019 in [facility]  PHQ-2 Total Score  0        Clinical Support from 08/18/2017 in [facility]  PHQ-9 Total Score  6      I have personally reviewed and have noted 1. The patient's medical and social history - reviewed today no changes 2. Their use of alcohol, tobacco or illicit drugs 3. Their current medications and supplements 4. The patient's functional ability including ADL's, fall risks, home safety risks and hearing or visual impairment. 5. Diet and physical activities 6. Evidence for depression or mood disorders 7. Care team reviewed and updated  Patient Care Team: Hoyt Koch, MD as PCP - General (Internal Medicine) Past Medical History:  Diagnosis Date  . Arthritis   . Cancer [facility]) 2011, 2012   left breast cancer 2011 central excision,2012 mastectomy  . Complication of anesthesia    shortness of breath after procedure  . Glaucoma   . Hypertension   . Protein-calorie malnutrition,  severe ([place])   . SVT (supraventricular tachycardia) ([place])   . Upper GI bleed 01/2013.    Melena. HPylori negative gastritis on EGD.    Past Surgical History:  Procedure Laterality Date  . ABDOMINAL HYSTERECTOMY    . BREAST SURGERY  2011  . BREAST SURGERY  2012   lt breast masty  . CHOLECYSTECTOMY    . ESOPHAGOGASTRODUODENOSCOPY N/A 01/25/2013   Procedure: ESOPHAGOGASTRODUODENOSCOPY (EGD);  Surgeon: Jerene Bears, MD;  Location: [place];  Service: Gastroenterology;  Laterality: N/A;  . ESOPHAGOGASTRODUODENOSCOPY (EGD) WITH PROPOFOL  08/04/2014   Procedure: ESOPHAGOGASTRODUODENOSCOPY (EGD) WITH PROPOFOL;  Surgeon: Inda Castle, MD;  Location: [facility] ENDOSCOPY;  Service: Endoscopy;;   Family History  Problem Relation Age of Onset  . Coronary artery disease Mother 29       MI   Review of Systems  Constitutional: Negative.   HENT: Negative.   Eyes: Negative.   Respiratory: Negative for cough, chest tightness and shortness of breath.   Cardiovascular: Negative for chest pain, palpitations and leg swelling.  Gastrointestinal: Negative for abdominal distention, abdominal pain, constipation, diarrhea, nausea and vomiting.  Musculoskeletal: Positive for gait problem.  Skin: Negative.   Neurological: Negative for dizziness, light-headedness and numbness.  Psychiatric/Behavioral: Negative.     Objective:  Physical Exam Constitutional:      Appearance: She is well-developed.  HENT:     Head: Normocephalic and atraumatic.  Cardiovascular:     Rate and Rhythm: Normal rate and regular rhythm.  Pulmonary:     Effort: Pulmonary effort is normal. No respiratory distress.     Breath sounds: Normal breath  sounds. No wheezing or rales.  Abdominal:     General: Bowel sounds are normal. There is no distension.     Palpations: Abdomen is soft.     Tenderness: There is no abdominal tenderness. There is no rebound.  Musculoskeletal:     Cervical back: Normal range of motion.  Skin:     General: Skin is warm and dry.  Neurological:     Mental Status: She is alert and oriented to person, place, and time.     Coordination: Coordination abnormal.     Comments: Walker with slow gait     Vitals:   06/29/19 1017  BP: 132/86  Pulse: (!) 103  Temp: 98.2 F (36.8 C)  TempSrc: Oral  SpO2: 98%  Weight: 169 lb (76.7 kg)  Height: 5\' 9"  (1.753 m)   This visit occurred during the SARS-CoV-2 public health emergency.  Safety protocols were in place, including screening questions prior to the visit, additional usage of staff PPE, and extensive cleaning of exam room while observing appropriate contact time as indicated for disinfecting solutions.   Assessment & Plan:

## 2019-06-29 NOTE — Assessment & Plan Note (Signed)
BP at goal on metoprolol, amlodipine, lisinopril. Checking BMP and adjust as needed.

## 2019-06-29 NOTE — Assessment & Plan Note (Signed)
Taking protonix, denies blood in stool. Checking CBC.

## 2019-06-29 NOTE — Patient Instructions (Signed)
Health Maintenance, Female Adopting a healthy lifestyle and getting preventive care are important in promoting health and wellness. Ask your health care provider about:  The right schedule for you to have regular tests and exams.  Things you can do on your own to prevent diseases and keep yourself healthy. What should I know about diet, weight, and exercise? Eat a healthy diet   Eat a diet that includes plenty of vegetables, fruits, low-fat dairy products, and lean protein.  Do not eat a lot of foods that are high in solid fats, added sugars, or sodium. Maintain a healthy weight Body mass index (BMI) is used to identify weight problems. It estimates body fat based on height and weight. Your health care provider can help determine your BMI and help you achieve or maintain a healthy weight. Get regular exercise Get regular exercise. This is one of the most important things you can do for your health. Most adults should:  Exercise for at least 150 minutes each week. The exercise should increase your heart rate and make you sweat (moderate-intensity exercise).  Do strengthening exercises at least twice a week. This is in addition to the moderate-intensity exercise.  Spend less time sitting. Even light physical activity can be beneficial. Watch cholesterol and blood lipids Have your blood tested for lipids and cholesterol at 84 years of age, then have this test every 5 years. Have your cholesterol levels checked more often if:  Your lipid or cholesterol levels are high.  You are older than 84 years of age.  You are at high risk for heart disease. What should I know about cancer screening? Depending on your health history and family history, you may need to have cancer screening at various ages. This may include screening for:  Breast cancer.  Cervical cancer.  Colorectal cancer.  Skin cancer.  Lung cancer. What should I know about heart disease, diabetes, and high blood  pressure? Blood pressure and heart disease  High blood pressure causes heart disease and increases the risk of stroke. This is more likely to develop in people who have high blood pressure readings, are of African descent, or are overweight.  Have your blood pressure checked: ? Every 3-5 years if you are 18-39 years of age. ? Every year if you are 40 years old or older. Diabetes Have regular diabetes screenings. This checks your fasting blood sugar level. Have the screening done:  Once every three years after age 40 if you are at a normal weight and have a low risk for diabetes.  More often and at a younger age if you are overweight or have a high risk for diabetes. What should I know about preventing infection? Hepatitis B If you have a higher risk for hepatitis B, you should be screened for this virus. Talk with your health care provider to find out if you are at risk for hepatitis B infection. Hepatitis C Testing is recommended for:  Everyone born from 1945 through 1965.  Anyone with known risk factors for hepatitis C. Sexually transmitted infections (STIs)  Get screened for STIs, including gonorrhea and chlamydia, if: ? You are sexually active and are younger than 84 years of age. ? You are older than 84 years of age and your health care provider tells you that you are at risk for this type of infection. ? Your sexual activity has changed since you were last screened, and you are at increased risk for chlamydia or gonorrhea. Ask your health care provider if   you are at risk.  Ask your health care provider about whether you are at high risk for HIV. Your health care provider may recommend a prescription medicine to help prevent HIV infection. If you choose to take medicine to prevent HIV, you should first get tested for HIV. You should then be tested every 3 months for as long as you are taking the medicine. Pregnancy  If you are about to stop having your period (premenopausal) and  you may become pregnant, seek counseling before you get pregnant.  Take 400 to 800 micrograms (mcg) of folic acid every day if you become pregnant.  Ask for birth control (contraception) if you want to prevent pregnancy. Osteoporosis and menopause Osteoporosis is a disease in which the bones lose minerals and strength with aging. This can result in bone fractures. If you are 65 years old or older, or if you are at risk for osteoporosis and fractures, ask your health care provider if you should:  Be screened for bone loss.  Take a calcium or vitamin D supplement to lower your risk of fractures.  Be given hormone replacement therapy (HRT) to treat symptoms of menopause. Follow these instructions at home: Lifestyle  Do not use any products that contain nicotine or tobacco, such as cigarettes, e-cigarettes, and chewing tobacco. If you need help quitting, ask your health care provider.  Do not use street drugs.  Do not share needles.  Ask your health care provider for help if you need support or information about quitting drugs. Alcohol use  Do not drink alcohol if: ? Your health care provider tells you not to drink. ? You are pregnant, may be pregnant, or are planning to become pregnant.  If you drink alcohol: ? Limit how much you use to 0-1 drink a day. ? Limit intake if you are breastfeeding.  Be aware of how much alcohol is in your drink. In the [place] one drink equals one 12 oz bottle of beer (355 mL), one 5 oz glass of wine (148 mL), or one 1 oz glass of hard liquor (44 mL). General instructions  Schedule regular health, dental, and eye exams.  Stay current with your vaccines.  Tell your health care provider if: ? You often feel depressed. ? You have ever been abused or do not feel safe at home. Summary  Adopting a healthy lifestyle and getting preventive care are important in promoting health and wellness.  Follow your health care provider's instructions about healthy  diet, exercising, and getting tested or screened for diseases.  Follow your health care provider's instructions on monitoring your cholesterol and blood pressure. This information is not intended to replace advice given to you by your health care provider. Make sure you discuss any questions you have with your health care provider. Document Revised: 05/13/2018 Document Reviewed: 05/13/2018 Elsevier Patient Education  2020 Elsevier Inc.  

## 2019-07-26 DIAGNOSIS — H401131 Primary open-angle glaucoma, bilateral, mild stage: Secondary | ICD-10-CM | POA: Diagnosis not present

## 2019-08-09 ENCOUNTER — Other Ambulatory Visit: Payer: Self-pay | Admitting: Internal Medicine

## 2019-08-24 DIAGNOSIS — Z23 Encounter for immunization: Secondary | ICD-10-CM | POA: Diagnosis not present

## 2019-08-31 ENCOUNTER — Other Ambulatory Visit: Payer: Self-pay | Admitting: Internal Medicine

## 2019-09-21 DIAGNOSIS — Z23 Encounter for immunization: Secondary | ICD-10-CM | POA: Diagnosis not present

## 2020-05-02 DIAGNOSIS — Z23 Encounter for immunization: Secondary | ICD-10-CM | POA: Diagnosis not present

## 2020-05-17 ENCOUNTER — Other Ambulatory Visit: Payer: Self-pay | Admitting: Internal Medicine

## 2020-06-12 ENCOUNTER — Ambulatory Visit: Payer: Medicare Other | Admitting: Internal Medicine

## 2020-06-26 DIAGNOSIS — H401131 Primary open-angle glaucoma, bilateral, mild stage: Secondary | ICD-10-CM | POA: Diagnosis not present

## 2020-06-26 DIAGNOSIS — H353131 Nonexudative age-related macular degeneration, bilateral, early dry stage: Secondary | ICD-10-CM | POA: Diagnosis not present

## 2020-06-26 DIAGNOSIS — Z961 Presence of intraocular lens: Secondary | ICD-10-CM | POA: Diagnosis not present

## 2020-07-03 ENCOUNTER — Ambulatory Visit (INDEPENDENT_AMBULATORY_CARE_PROVIDER_SITE_OTHER): Payer: Medicare Other | Admitting: Internal Medicine

## 2020-07-03 ENCOUNTER — Encounter: Payer: Self-pay | Admitting: Internal Medicine

## 2020-07-03 ENCOUNTER — Other Ambulatory Visit: Payer: Self-pay

## 2020-07-03 VITALS — BP 118/74 | HR 76 | Ht 67.0 in | Wt 160.0 lb

## 2020-07-03 DIAGNOSIS — I10 Essential (primary) hypertension: Secondary | ICD-10-CM

## 2020-07-03 DIAGNOSIS — I351 Nonrheumatic aortic (valve) insufficiency: Secondary | ICD-10-CM

## 2020-07-03 DIAGNOSIS — I471 Supraventricular tachycardia: Secondary | ICD-10-CM

## 2020-07-03 MED ORDER — METOPROLOL TARTRATE 50 MG PO TABS: 100.0000 mg | ORAL_TABLET | Freq: Two times a day (BID) | ORAL | 3 refills | Status: DC

## 2020-07-03 NOTE — Progress Notes (Signed)
OFFICE NOTE  Chief Complaint:  Follow-up  Primary Care Physician: Hoyt Koch, MD  HPI:  [name] is a 87 y.o. female with a past medial history significant for SVT which improved with adenosine, hypertension, malnutrition and a history of upper GI bleeding.  I last saw her in 2015 recently she saw Almyra Deforest, PA-C, for tachycardia.  He felt that she might of actually had atrial fibrillation, but I disagree with this.  We discussed the possibility of anticoagulation however given her age and the fact that she does have some falls, she would not be a good candidate for anticoagulation.  Today she reports being asymptomatic.  Her EKG shows sinus rhythm.  She denies any recurrent significant palpitations.  Blood pressure is elevated however her son reports it has been elevated for years and difficult to control.  In the past she been on additional medicine but apparently her primary care provider discontinued 1 of the medicines because she felt unwell with it.  Denies any chest pain or worsening shortness of breath.  07/27/2018  [name] seen today in follow-up.  Overall she is doing well.  Several months ago she was in the ER for chest discomfort which was felt to be atypical although has not had any significant recurrent SVT.  She denies any worsening shortness of breath.  She remains fairly independent and is able to do most of her ADLs.  Blood pressure appears to be well controlled.  She does have a history of mild to moderate AI but no worsening shortness of breath related to that.  05/17/2019  [name] is seen today in follow-up and is accompanied by her son.  She just had a recent birthday and turned 87.  She denies any chest pain or worsening shortness of breath.  Although her heart rate was increased today in a sinus tachycardia, she had just done a lot of walking and maneuvering and after that her heart rate did improve on auscultation.  Blood pressure was top normal  today.  She did take her medicines.  He denies any SVT.  07/03/2020  [name] seen today for follow-up.  Her son is with her again.  She is now 87.  She has no complaints.  He says that she denies any chest pain or shortness of breath.  Interestingly when she had gotten up to be weighed today and back in her wheelchair an EKG showed an SVT.  Clearly this is not a sinus tachycardia with lack of P waves but subsequently she had normal sinus rhythm.  This is consistent with her known SVTs however she seems to be asymptomatic with it.  PMHx:  Past Medical History:  Diagnosis Date  . Arthritis   . Cancer [facility]) 2011, 2012   left breast cancer 2011 central excision,2012 mastectomy  . Complication of anesthesia    shortness of breath after procedure  . Glaucoma   . Hypertension   . Protein-calorie malnutrition, severe ([place])   . SVT (supraventricular tachycardia) ([place])   . Upper GI bleed 01/2013.    Melena. HPylori negative gastritis on EGD.     Past Surgical History:  Procedure Laterality Date  . ABDOMINAL HYSTERECTOMY    . BREAST SURGERY  2011  . BREAST SURGERY  2012   lt breast masty  . CHOLECYSTECTOMY    . ESOPHAGOGASTRODUODENOSCOPY N/A 01/25/2013   Procedure: ESOPHAGOGASTRODUODENOSCOPY (EGD);  Surgeon: Jerene Bears, MD;  Location: [place];  Service: Gastroenterology;  Laterality: N/A;  .  ESOPHAGOGASTRODUODENOSCOPY (EGD) WITH PROPOFOL  08/04/2014   Procedure: ESOPHAGOGASTRODUODENOSCOPY (EGD) WITH PROPOFOL;  Surgeon: Inda Castle, MD;  Location: [facility] ENDOSCOPY;  Service: Endoscopy;;    FAMHx:  Family History  Problem Relation Age of Onset  . Coronary artery disease Mother 54       MI    SOCHx:   reports that she has never smoked. She has never used smokeless tobacco. She reports that she does not drink alcohol and does not use drugs.  ALLERGIES:  Allergies  Allergen Reactions  . Codeine Nausea And Vomiting    ROS: Pertinent items noted in HPI and remainder of  comprehensive ROS otherwise negative.  HOME MEDS: Current Outpatient Medications on File Prior to Visit  Medication Sig Dispense Refill  . acetaminophen (TYLENOL) 325 MG tablet Take 1-2 tablets (325-650 mg total) by mouth every 6 (six) hours as needed (for pain).    [name] amLODipine (NORVASC) 10 MG tablet TAKE 1 TABLET(10 MG) BY MOUTH DAILY 90 tablet 1  . aspirin EC 81 MG tablet Take 1 tablet (81 mg total) by mouth daily.    . diclofenac sodium (VOLTAREN) 1 % GEL Apply 2 g topically See admin instructions. Apply 2 grams to both knees for pain four times a day    . latanoprost (XALATAN) 0.005 % ophthalmic solution Place 1 drop into both eyes at bedtime.    [name] lisinopril (ZESTRIL) 40 MG tablet TAKE 1 TABLET(40 MG) BY MOUTH DAILY 90 tablet 1  . metoprolol tartrate (LOPRESSOR) 50 MG tablet Take 1 tablet (50 mg total) by mouth 2 (two) times daily. 180 tablet 2  . pantoprazole (PROTONIX) 40 MG tablet TAKE 1 TABLET BY MOUTH EVERY DAY 90 tablet 1  . travoprost, benzalkonium, (TRAVATAN) 0.004 % ophthalmic solution Place 1 drop into both eyes at bedtime. 2.5 mL 12   No current facility-administered medications on file prior to visit.    LABS/IMAGING: No results found for this or any previous visit (from the past 48 hour(s)). No results found.  LIPID PANEL:    Component Value Date/Time   CHOL 213 (H) 06/29/2019 1050   TRIG 97.0 06/29/2019 1050   HDL 58.30 06/29/2019 1050   CHOLHDL 4 06/29/2019 1050   VLDL 19.4 06/29/2019 1050   LDLCALC 135 (H) 06/29/2019 1050     WEIGHTS: Wt Readings from Last 3 Encounters:  07/03/20 160 lb (72.6 kg)  06/29/19 169 lb (76.7 kg)  05/17/19 171 lb 3.2 oz (77.7 kg)    VITALS: BP 118/74 (BP Location: Left Arm, Patient Position: Sitting)   Pulse 76   Ht 5\' 7"  (1.702 m)   Wt 160 lb (72.6 kg)   SpO2 96%   BMI 25.06 kg/m   EXAM: General appearance: alert and no distress Neck: no carotid bruit, no JVD and thyroid not enlarged, symmetric, no  tenderness/mass/nodules Lungs: clear to auscultation bilaterally Heart: regular rate and rhythm, S1, S2 normal, no murmur, click, rub or gallop Abdomen: soft, non-tender; bowel sounds normal; no masses,  no organomegaly Extremities: extremities normal, atraumatic, no cyanosis or edema Pulses: 2+ and symmetric Skin: Skin color, texture, turgor normal. No rashes or lesions Neurologic: Grossly normal Psych: Pleasant  EKG:  Accelerated junctional rhythm at 127, followed by sinus rhythm first-degree AV block at 76, incomplete right bundle branch block-personally reviewed  ASSESSMENT: 1. History of PSVT-noted to be present today 2. Hypertension 3. History of mild to moderate aortic insufficiency  PLAN: 1.   [name] had some PSVT today which she  is asymptomatic.  The concern is however she is very tachycardic this could lead to dizziness or possibly presyncope.  In addition she has some mild to moderate aortic insufficiency.  I would recommend increasing her beta-blocker further to 100 mg twice daily.  Because she has some difficulty swallowing pills, we will prescribe her the 50 mg pill and have her take 2 tablets twice daily.  She will monitor blood pressure at home and report to [place] after about a week.  We may need to decrease either amlodipine or lisinopril.  Follow-up annually or sooner as necessary.  Pixie Casino, MD, [facility], [place] Director of the Advanced Lipid Disorders &  Cardiovascular Risk Reduction Clinic Diplomate of the American Board of Clinical Lipidology Attending Cardiologist  Direct Dial: [phone]  Fax: [phone]  Website:  [email or web address] 07/03/2020, 1:33 PM

## 2020-07-03 NOTE — Patient Instructions (Signed)
Medication Instructions:  INCREASE metoprolol tartrate to 100mg  twice daily -- take 2 of the 50mg  tablets twice daily  *If you need a refill on your cardiac medications before your next appointment, please call your pharmacy*  Follow-Up: At [facility], you and your health needs are our priority.  As part of our continuing mission to provide you with exceptional heart care, we have created designated Provider Care Teams.  These Care Teams include your primary Cardiologist (physician) and Advanced Practice Providers (APPs -  Physician Assistants and Nurse Practitioners) who all work together to provide you with the care you need, when you need it.  We recommend signing up for the patient portal called "MyChart".  Sign up information is provided on this After Visit Summary.  MyChart is used to connect with patients for Virtual Visits (Telemedicine).  Patients are able to view lab/test results, encounter notes, upcoming appointments, etc.  Non-urgent messages can be sent to your provider as well.   To learn more about what you can do with MyChart, go to [email or web address].    Your next appointment:   12 month(s)  The format for your next appointment:   In Person  Provider:   You may see Dr. Debara Pickett or one of the following Advanced Practice Providers on your designated Care Team:    Almyra Deforest, PA-C  Fabian Sharp, [place] or   Roby Lofts, [place]    Other Instructions

## 2020-07-10 ENCOUNTER — Ambulatory Visit: Payer: Medicare Other | Admitting: Internal Medicine

## 2020-07-17 ENCOUNTER — Encounter: Payer: Self-pay | Admitting: Internal Medicine

## 2020-07-17 DIAGNOSIS — Z1231 Encounter for screening mammogram for malignant neoplasm of breast: Secondary | ICD-10-CM | POA: Diagnosis not present

## 2020-07-31 ENCOUNTER — Encounter: Payer: Self-pay | Admitting: Internal Medicine

## 2020-07-31 ENCOUNTER — Other Ambulatory Visit: Payer: Self-pay

## 2020-07-31 ENCOUNTER — Ambulatory Visit (INDEPENDENT_AMBULATORY_CARE_PROVIDER_SITE_OTHER): Payer: Medicare Other | Admitting: Internal Medicine

## 2020-07-31 VITALS — BP 132/78 | HR 103 | Temp 97.9°F | Resp 18 | Ht 67.0 in | Wt 160.4 lb

## 2020-07-31 DIAGNOSIS — K219 Gastro-esophageal reflux disease without esophagitis: Secondary | ICD-10-CM | POA: Diagnosis not present

## 2020-07-31 DIAGNOSIS — M8949 Other hypertrophic osteoarthropathy, multiple sites: Secondary | ICD-10-CM

## 2020-07-31 DIAGNOSIS — M159 Polyosteoarthritis, unspecified: Secondary | ICD-10-CM

## 2020-07-31 DIAGNOSIS — M150 Primary generalized (osteo)arthritis: Secondary | ICD-10-CM

## 2020-07-31 DIAGNOSIS — I10 Essential (primary) hypertension: Secondary | ICD-10-CM

## 2020-07-31 DIAGNOSIS — Z0000 Encounter for general adult medical examination without abnormal findings: Secondary | ICD-10-CM

## 2020-07-31 LAB — CBC
HCT: 42.1 % (ref 36.0–46.0)
Hemoglobin: 13.8 g/dL (ref 12.0–15.0)
MCHC: 32.8 g/dL (ref 30.0–36.0)
MCV: 84.3 fl (ref 78.0–100.0)
Platelets: 401 10*3/uL — ABNORMAL HIGH (ref 150.0–400.0)
RBC: 4.99 Mil/uL (ref 3.87–5.11)
RDW: 15.7 % — ABNORMAL HIGH (ref 11.5–15.5)
WBC: 5.9 10*3/uL (ref 4.0–10.5)

## 2020-07-31 LAB — COMPREHENSIVE METABOLIC PANEL
ALT: 7 U/L (ref 0–35)
AST: 15 U/L (ref 0–37)
Albumin: 4 g/dL (ref 3.5–5.2)
Alkaline Phosphatase: 98 U/L (ref 39–117)
BUN: 16 mg/dL (ref 6–23)
CO2: 25 mEq/L (ref 19–32)
Calcium: 9.7 mg/dL (ref 8.4–10.5)
Chloride: 106 mEq/L (ref 96–112)
Creatinine, Ser: 0.88 mg/dL (ref 0.40–1.20)
GFR: 55.44 mL/min — ABNORMAL LOW (ref >60.00)
Glucose, Bld: 91 mg/dL (ref 70–99)
Potassium: 4.1 mEq/L (ref 3.5–5.1)
Sodium: 141 mEq/L (ref 135–145)
Total Bilirubin: 0.5 mg/dL (ref 0.2–1.2)
Total Protein: 7.6 g/dL (ref 6.0–8.3)

## 2020-07-31 LAB — LIPID PANEL
Cholesterol: 219 mg/dL — ABNORMAL HIGH (ref 0–200)
HDL: 60 mg/dL (ref >39.00)
LDL Cholesterol: 134 mg/dL — ABNORMAL HIGH (ref 0–99)
NonHDL: 159.43
Total CHOL/HDL Ratio: 4
Triglycerides: 126 mg/dL (ref 0.0–149.0)
VLDL: 25.2 mg/dL (ref 0.0–40.0)

## 2020-07-31 MED ORDER — PANTOPRAZOLE SODIUM 40 MG PO TBEC: 40.0000 mg | DELAYED_RELEASE_TABLET | Freq: Every day | ORAL | 1 refills | Status: DC

## 2020-07-31 MED ORDER — AMLODIPINE BESYLATE 10 MG PO TABS: ORAL_TABLET | ORAL | 1 refills | Status: DC

## 2020-07-31 MED ORDER — LISINOPRIL 40 MG PO TABS: ORAL_TABLET | ORAL | 1 refills | Status: DC

## 2020-07-31 MED ORDER — METOPROLOL TARTRATE 50 MG PO TABS: 50.0000 mg | ORAL_TABLET | Freq: Two times a day (BID) | ORAL | 3 refills | Status: DC

## 2020-07-31 NOTE — Patient Instructions (Signed)
Health Maintenance, Female Adopting a healthy lifestyle and getting preventive care are important in promoting health and wellness. Ask your health care provider about:  The right schedule for you to have regular tests and exams.  Things you can do on your own to prevent diseases and keep yourself healthy. What should I know about diet, weight, and exercise? Eat a healthy diet  Eat a diet that includes plenty of vegetables, fruits, low-fat dairy products, and lean protein.  Do not eat a lot of foods that are high in solid fats, added sugars, or sodium.   Maintain a healthy weight Body mass index (BMI) is used to identify weight problems. It estimates body fat based on height and weight. Your health care provider can help determine your BMI and help you achieve or maintain a healthy weight. Get regular exercise Get regular exercise. This is one of the most important things you can do for your health. Most adults should:  Exercise for at least 150 minutes each week. The exercise should increase your heart rate and make you sweat (moderate-intensity exercise).  Do strengthening exercises at least twice a week. This is in addition to the moderate-intensity exercise.  Spend less time sitting. Even light physical activity can be beneficial. Watch cholesterol and blood lipids Have your blood tested for lipids and cholesterol at 87 years of age, then have this test every 5 years. Have your cholesterol levels checked more often if:  Your lipid or cholesterol levels are high.  You are older than 85 years of age.  You are at high risk for heart disease. What should I know about cancer screening? Depending on your health history and family history, you may need to have cancer screening at various ages. This may include screening for:  Breast cancer.  Cervical cancer.  Colorectal cancer.  Skin cancer.  Lung cancer. What should I know about heart disease, diabetes, and high blood  pressure? Blood pressure and heart disease  High blood pressure causes heart disease and increases the risk of stroke. This is more likely to develop in people who have high blood pressure readings, are of African descent, or are overweight.  Have your blood pressure checked: ? Every 3-5 years if you are 18-39 years of age. ? Every year if you are 40 years old or older. Diabetes Have regular diabetes screenings. This checks your fasting blood sugar level. Have the screening done:  Once every three years after age 40 if you are at a normal weight and have a low risk for diabetes.  More often and at a younger age if you are overweight or have a high risk for diabetes. What should I know about preventing infection? Hepatitis B If you have a higher risk for hepatitis B, you should be screened for this virus. Talk with your health care provider to find out if you are at risk for hepatitis B infection. Hepatitis C Testing is recommended for:  Everyone born from 1945 through 1965.  Anyone with known risk factors for hepatitis C. Sexually transmitted infections (STIs)  Get screened for STIs, including gonorrhea and chlamydia, if: ? You are sexually active and are younger than 85 years of age. ? You are older than 85 years of age and your health care provider tells you that you are at risk for this type of infection. ? Your sexual activity has changed since you were last screened, and you are at increased risk for chlamydia or gonorrhea. Ask your health care provider   if you are at risk.  Ask your health care provider about whether you are at high risk for HIV. Your health care provider may recommend a prescription medicine to help prevent HIV infection. If you choose to take medicine to prevent HIV, you should first get tested for HIV. You should then be tested every 3 months for as long as you are taking the medicine. Pregnancy  If you are about to stop having your period (premenopausal) and  you may become pregnant, seek counseling before you get pregnant.  Take 400 to 800 micrograms (mcg) of folic acid every day if you become pregnant.  Ask for birth control (contraception) if you want to prevent pregnancy. Osteoporosis and menopause Osteoporosis is a disease in which the bones lose minerals and strength with aging. This can result in bone fractures. If you are 65 years old or older, or if you are at risk for osteoporosis and fractures, ask your health care provider if you should:  Be screened for bone loss.  Take a calcium or vitamin D supplement to lower your risk of fractures.  Be given hormone replacement therapy (HRT) to treat symptoms of menopause. Follow these instructions at home: Lifestyle  Do not use any products that contain nicotine or tobacco, such as cigarettes, e-cigarettes, and chewing tobacco. If you need help quitting, ask your health care provider.  Do not use street drugs.  Do not share needles.  Ask your health care provider for help if you need support or information about quitting drugs. Alcohol use  Do not drink alcohol if: ? Your health care provider tells you not to drink. ? You are pregnant, may be pregnant, or are planning to become pregnant.  If you drink alcohol: ? Limit how much you use to 0-1 drink a day. ? Limit intake if you are breastfeeding.  Be aware of how much alcohol is in your drink. In the [place] one drink equals one 12 oz bottle of beer (355 mL), one 5 oz glass of wine (148 mL), or one 1 oz glass of hard liquor (44 mL). General instructions  Schedule regular health, dental, and eye exams.  Stay current with your vaccines.  Tell your health care provider if: ? You often feel depressed. ? You have ever been abused or do not feel safe at home. Summary  Adopting a healthy lifestyle and getting preventive care are important in promoting health and wellness.  Follow your health care provider's instructions about healthy  diet, exercising, and getting tested or screened for diseases.  Follow your health care provider's instructions on monitoring your cholesterol and blood pressure. This information is not intended to replace advice given to you by your health care provider. Make sure you discuss any questions you have with your health care provider. Document Revised: 05/13/2018 Document Reviewed: 05/13/2018 Elsevier Patient Education  2021 Elsevier Inc.  

## 2020-07-31 NOTE — Progress Notes (Signed)
Subjective:   Patient ID: [name], female    DOB: [birth year], 87 y.o.   MRN: [id number]  HPI Here for medicare wellness, no new complaints. Please see A/P for status and treatment of chronic medical problems.   HPI #2: Here for follow up blood pressure (BP at goal, taking amlodipine and lisinopril and metoprolol, denies chest pains or headaches, denies side effects, cardiology had asked her to double metoprolol dosing but her daughter did not think this was wise and left it the same, denies palpitations) and GERD (taking protonix 40 mg daily, overall well controlled, denies blood in stool) and arthritis (using tylenol for pain mostly, overall well controlled, uses walker sometimes, denies recent falls except 1).   Diet: heart healthy  Physical activity: sedentary Depression/mood screen: negative Hearing: mild to moderate loss bilaterally Visual acuity: grossly normal, overdue for annual eye exam  ADLs: capable Fall risk: none Home safety: good Cognitive evaluation: intact to orientation, naming, recall and repetition EOL planning: adv directives discussed, not in place  [employer] Visit from 07/31/2020 in [place] at [facility]  PHQ-2 Total Score 0      [place] from 08/18/2017 in [place]  PHQ-9 Total Score 6      I have personally reviewed and have noted 1. The patient's medical and social history - reviewed today no changes 2. Their use of alcohol, tobacco or illicit drugs 3. Their current medications and supplements 4. The patient's functional ability including ADL's, fall risks, home safety risks and hearing or visual impairment. 5. Diet and physical activities 6. Evidence for depression or mood disorders 7. Care team reviewed and updated 8.  The patient is not on an opioid pain medication.  Patient Care Team: Hoyt Koch, MD as PCP - General (Internal Medicine) Debara Pickett Nadean Corwin,  MD as PCP - Cardiology (Cardiology) Past Medical History:  Diagnosis Date  . Arthritis   . Cancer [facility]) 2011, 2012   left breast cancer 2011 central excision,2012 mastectomy  . Complication of anesthesia    shortness of breath after procedure  . Glaucoma   . Hypertension   . Protein-calorie malnutrition, severe ([place])   . SVT (supraventricular tachycardia) ([place])   . Upper GI bleed 01/2013.    Melena. HPylori negative gastritis on EGD.    Past Surgical History:  Procedure Laterality Date  . ABDOMINAL HYSTERECTOMY    . BREAST SURGERY  2011  . BREAST SURGERY  2012   lt breast masty  . CHOLECYSTECTOMY    . ESOPHAGOGASTRODUODENOSCOPY N/A 01/25/2013   Procedure: ESOPHAGOGASTRODUODENOSCOPY (EGD);  Surgeon: Jerene Bears, MD;  Location: [place];  Service: Gastroenterology;  Laterality: N/A;  . ESOPHAGOGASTRODUODENOSCOPY (EGD) WITH PROPOFOL  08/04/2014   Procedure: ESOPHAGOGASTRODUODENOSCOPY (EGD) WITH PROPOFOL;  Surgeon: Inda Castle, MD;  Location: [facility] ENDOSCOPY;  Service: Endoscopy;;   Family History  Problem Relation Age of Onset  . Coronary artery disease Mother 69       MI   Review of Systems  Constitutional: Negative.   HENT: Negative.   Eyes: Negative.   Respiratory: Negative for cough, chest tightness and shortness of breath.   Cardiovascular: Negative for chest pain, palpitations and leg swelling.  Gastrointestinal: Negative for abdominal distention, abdominal pain, constipation, diarrhea, nausea and vomiting.  Musculoskeletal: Negative.   Skin: Negative.   Neurological: Negative.   Psychiatric/Behavioral: Negative.     Objective:  Physical Exam Constitutional:      Appearance: She  is well-developed and well-nourished.  HENT:     Head: Normocephalic and atraumatic.  Eyes:     Extraocular Movements: EOM normal.  Cardiovascular:     Rate and Rhythm: Normal rate and regular rhythm.  Pulmonary:     Effort: Pulmonary effort is normal. No respiratory distress.      Breath sounds: Normal breath sounds. No wheezing or rales.  Abdominal:     General: Bowel sounds are normal. There is no distension.     Palpations: Abdomen is soft.     Tenderness: There is no abdominal tenderness. There is no rebound.  Musculoskeletal:        General: No edema.     Cervical back: Normal range of motion.  Skin:    General: Skin is warm and dry.  Neurological:     Mental Status: She is alert and oriented to person, place, and time.     Coordination: Coordination normal.  Psychiatric:        Mood and Affect: Mood and affect normal.     Vitals:   07/31/20 1107  BP: 132/78  Pulse: (!) 103  Resp: 18  Temp: 97.9 F (36.6 C)  TempSrc: Oral  SpO2: 98%  Weight: 160 lb 6.4 oz (72.8 kg)  Height: 5\' 7"  (1.702 m)   This visit occurred during the SARS-CoV-2 public health emergency.  Safety protocols were in place, including screening questions prior to the visit, additional usage of staff PPE, and extensive cleaning of exam room while observing appropriate contact time as indicated for disinfecting solutions.   Assessment & Plan:

## 2020-08-01 NOTE — Assessment & Plan Note (Signed)
BP at goal on metoprolol 50 mg BID (advised not to increase) and lisinopril and amlodipine. Checking CMP and adjust as needed.

## 2020-08-01 NOTE — Assessment & Plan Note (Signed)
Using tylenol for pain and given her blood pressure recommended not to use NSAIDs on a regular basis.

## 2020-08-01 NOTE — Assessment & Plan Note (Signed)
Taking protonix and refilled. Symptoms controlled and past bleed 2014 so recommended PPI lifelong.

## 2020-08-01 NOTE — Assessment & Plan Note (Signed)
Flu shot declines. Covid-19 up to date. Pneumonia complete. Shingrix counseled. Tetanus due declines. Colonoscopy aged out. Mammogram aged out, pap smear aged out and dexa declines further. Counseled about sun safety and mole surveillance. Counseled about the dangers of distracted driving. Given 10 year screening recommendations.

## 2020-09-14 ENCOUNTER — Other Ambulatory Visit: Payer: Self-pay | Admitting: Internal Medicine

## 2020-12-25 DIAGNOSIS — H401131 Primary open-angle glaucoma, bilateral, mild stage: Secondary | ICD-10-CM | POA: Diagnosis not present

## 2021-01-03 ENCOUNTER — Emergency Department (HOSPITAL_COMMUNITY): Payer: Medicare Other

## 2021-01-03 ENCOUNTER — Other Ambulatory Visit: Payer: Self-pay

## 2021-01-03 ENCOUNTER — Encounter (HOSPITAL_COMMUNITY): Payer: Self-pay

## 2021-01-03 ENCOUNTER — Emergency Department (HOSPITAL_COMMUNITY)
Admission: EM | Admit: 2021-01-03 | Discharge: 2021-01-03 | Disposition: A | Discharge status: Home / Self Care | Payer: Medicare Other | Attending: Emergency Medicine | Admitting: Emergency Medicine

## 2021-01-03 DIAGNOSIS — S42031A Displaced fracture of lateral end of right clavicle, initial encounter for closed fracture: Secondary | ICD-10-CM | POA: Insufficient documentation

## 2021-01-03 DIAGNOSIS — R000 Tachycardia, unspecified: Secondary | ICD-10-CM | POA: Diagnosis not present

## 2021-01-03 DIAGNOSIS — Z7982 Long term (current) use of aspirin: Secondary | ICD-10-CM | POA: Diagnosis not present

## 2021-01-03 DIAGNOSIS — I10 Essential (primary) hypertension: Secondary | ICD-10-CM | POA: Diagnosis not present

## 2021-01-03 DIAGNOSIS — Z853 Personal history of malignant neoplasm of breast: Secondary | ICD-10-CM | POA: Insufficient documentation

## 2021-01-03 DIAGNOSIS — W01198A Fall on same level from slipping, tripping and stumbling with subsequent striking against other object, initial encounter: Secondary | ICD-10-CM | POA: Diagnosis not present

## 2021-01-03 DIAGNOSIS — Z79899 Other long term (current) drug therapy: Secondary | ICD-10-CM | POA: Diagnosis not present

## 2021-01-03 DIAGNOSIS — S4991XA Unspecified injury of right shoulder and upper arm, initial encounter: Secondary | ICD-10-CM | POA: Diagnosis present

## 2021-01-03 DIAGNOSIS — M25511 Pain in right shoulder: Secondary | ICD-10-CM | POA: Insufficient documentation

## 2021-01-03 LAB — BASIC METABOLIC PANEL WITH GFR
Anion gap: 8 (ref 5–15)
BUN: 13 mg/dL (ref 8–23)
CO2: 26 mmol/L (ref 22–32)
Calcium: 9.2 mg/dL (ref 8.9–10.3)
Chloride: 108 mmol/L (ref 98–111)
Creatinine, Ser: 0.85 mg/dL (ref 0.44–1.00)
GFR, Estimated: >60 mL/min
Glucose, Bld: 111 mg/dL — ABNORMAL HIGH (ref 70–99)
Potassium: 4.3 mmol/L (ref 3.5–5.1)
Sodium: 142 mmol/L (ref 135–145)

## 2021-01-03 LAB — CBC WITH DIFFERENTIAL/PLATELET
Abs Immature Granulocytes: 0.06 10*3/uL (ref 0.00–0.07)
Basophils Absolute: 0 10*3/uL (ref 0.0–0.1)
Basophils Relative: 0 %
Eosinophils Absolute: 0 10*3/uL (ref 0.0–0.5)
Eosinophils Relative: 0 %
HCT: 44.7 % (ref 36.0–46.0)
Hemoglobin: 14.2 g/dL (ref 12.0–15.0)
Immature Granulocytes: 1 %
Lymphocytes Relative: 13 %
Lymphs Abs: 1.2 10*3/uL (ref 0.7–4.0)
MCH: 27.7 pg (ref 26.0–34.0)
MCHC: 31.8 g/dL (ref 30.0–36.0)
MCV: 87.3 fL (ref 80.0–100.0)
Monocytes Absolute: 0.6 10*3/uL (ref 0.1–1.0)
Monocytes Relative: 6 %
Neutro Abs: 7.2 10*3/uL (ref 1.7–7.7)
Neutrophils Relative %: 80 %
Platelets: 351 10*3/uL (ref 150–400)
RBC: 5.12 MIL/uL — ABNORMAL HIGH (ref 3.87–5.11)
RDW: 14.7 % (ref 11.5–15.5)
WBC: 9.1 10*3/uL (ref 4.0–10.5)
nRBC: 0 % (ref 0.0–0.2)

## 2021-01-03 LAB — URINALYSIS, MICROSCOPIC (REFLEX)

## 2021-01-03 LAB — URINALYSIS, ROUTINE W REFLEX MICROSCOPIC
Bilirubin Urine: NEGATIVE
Glucose, UA: NEGATIVE mg/dL
Ketones, ur: NEGATIVE mg/dL
Leukocytes,Ua: NEGATIVE
Nitrite: NEGATIVE
Protein, ur: NEGATIVE mg/dL
Specific Gravity, Urine: 1.015 (ref 1.005–1.030)
pH: 7 (ref 5.0–8.0)

## 2021-01-03 MED ORDER — ACETAMINOPHEN 500 MG PO TABS: 1000.0000 mg | ORAL_TABLET | Freq: Four times a day (QID) | ORAL | 0 refills | Status: DC | PRN

## 2021-01-03 MED ORDER — ACETAMINOPHEN 500 MG PO TABS
1000.0000 mg | ORAL_TABLET | Freq: Once | ORAL | Status: AC
  Administered 2021-01-03: 1000 mg via ORAL
  Filled 2021-01-03: qty 2

## 2021-01-03 NOTE — ED Notes (Signed)
Pt and family member got wheelchair.

## 2021-01-03 NOTE — ED Notes (Signed)
PT did not want repeat vitals

## 2021-01-03 NOTE — Discharge Instructions (Addendum)
You have a broken and displaced clavicle.  Please wear sling at all time for support.  Do not bear any weight on the right arm.  Use wheel chair to help move about.  Call and follow up with orthopedist next week for further care.  Return if you have any concerns.

## 2021-01-03 NOTE — Progress Notes (Signed)
Orthopedic Tech Progress Note Patient Details:  [name] 02/10/1922 [id number]  Ortho Devices Type of Ortho Device: Arm sling Ortho Device/Splint Location: Right Arm Ortho Device/Splint Interventions: Application   Post Interventions Patient Tolerated: Well  [name] [name] 01/03/2021, 9:43 AM

## 2021-01-03 NOTE — ED Notes (Signed)
Patient discharge instructions reviewed with the patient. The patient verbalized understanding of instructions. Patient discharged. 

## 2021-01-03 NOTE — Discharge Planning (Signed)
.  Fuller Mandril, RN, BSN, [place] [phone] Pt qualifies for DME wheelchair.  DME  ordered through [employer].  Freda Munro of [place] notified to deliver wheelchair to pt room prior to D/C home.

## 2021-01-03 NOTE — ED Provider Notes (Signed)
[facility] EMERGENCY DEPARTMENT Provider Note   CSN: [id number] Arrival date & time: 01/03/21  0738     History No chief complaint on file.   [name] is a 87 y.o. female.  The history is provided by the patient, a relative and medical records. No language interpreter was used.   87 year old female significant history of breast cancer, hypertension, malnutrition, SVT, arthritis presenting for evaluation of a fall.  Patient lives at home with daughter.  This morning she went to the door to open it for her neighbor to come in when she fell landed on her right shoulder.  Did not report hitting her head or any loss of consciousness but is complaining of pain to her right shoulder.  Her daughter was contacted and came home and noted that shoulder appears  abnormal thus brought patient here.  This time patient report pain as mild to moderate nonradiating without any headache neck pain chest pain abdominal pain back pain or pain to other extremities.  She is taking baby aspirin twice weekly.  She normally walks with a walker.  She denies any precipitating symptoms to the fall however history is a bit difficult to obtain as patient is a poor historian.  No dysuria.  No specific treatment tried.  Past Medical History:  Diagnosis Date   Arthritis    Cancer ([place]) 2011, 2012   left breast cancer 2011 central excision,2012 mastectomy   Complication of anesthesia    shortness of breath after procedure   Glaucoma    Hypertension    Protein-calorie malnutrition, severe ([place])    SVT (supraventricular tachycardia) ([place])    Upper GI bleed 01/2013.    Melena. HPylori negative gastritis on EGD.     Patient Active Problem List   Diagnosis Date Noted   Routine general medical examination at a health care facility 06/29/2019   Aortic valve regurgitation 07/01/2017   Debilitated 08/10/2014   SVT (supraventricular tachycardia) ([place]) 02/23/2014   DJD (degenerative joint disease)  04/07/2013   Breast cancer- s/p mastectomy 03/11/2013   GERD 03/10/2008   GLAUCOMA 09/30/2007   Hypertension 09/30/2007    Past Surgical History:  Procedure Laterality Date   ABDOMINAL HYSTERECTOMY     BREAST SURGERY  2011   BREAST SURGERY  2012   lt breast masty   CHOLECYSTECTOMY     ESOPHAGOGASTRODUODENOSCOPY N/A 01/25/2013   Procedure: ESOPHAGOGASTRODUODENOSCOPY (EGD);  Surgeon: Jerene Bears, MD;  Location: [place];  Service: Gastroenterology;  Laterality: N/A;   ESOPHAGOGASTRODUODENOSCOPY (EGD) WITH PROPOFOL  08/04/2014   Procedure: ESOPHAGOGASTRODUODENOSCOPY (EGD) WITH PROPOFOL;  Surgeon: Inda Castle, MD;  [facility] ENDOSCOPY;  Service: Endoscopy;;     OB History   No obstetric history on file.     Family History  Problem Relation Age of Onset   Coronary artery disease Mother 88       MI    Social History   Tobacco Use   Smoking status: Never   Smokeless tobacco: Never  Vaping Use   Vaping Use: Never used  Substance Use Topics   Alcohol use: No   Drug use: No    Home Medications Prior to Admission medications   Medication Sig Start Date End Date Taking? Authorizing Provider  acetaminophen (TYLENOL) 325 MG tablet Take 1-2 tablets (325-650 mg total) by mouth every 6 (six) hours as needed (for pain). 10/31/17   Oretha Milch D, MD  amLODipine (NORVASC) 10 MG tablet TAKE 1 TABLET(10 MG) BY MOUTH  DAILY 07/31/20   Hoyt Koch, MD  aspirin EC 81 MG tablet Take 1 tablet (81 mg total) by mouth daily. 10/31/17   Oretha Milch D, MD  diclofenac sodium (VOLTAREN) 1 % GEL Apply 2 g topically See admin instructions. Apply 2 grams to both knees for pain four times a day 10/31/17   Oretha Milch D, MD  latanoprost (XALATAN) 0.005 % ophthalmic solution Place 1 drop into both eyes at bedtime. 06/17/18   [provider]  lisinopril (ZESTRIL) 40 MG tablet TAKE 1 TABLET(40 MG) BY MOUTH DAILY 07/31/20   Hoyt Koch, MD  metoprolol tartrate  (LOPRESSOR) 50 MG tablet TAKE 1 TABLET(50 MG) BY MOUTH TWICE DAILY 09/14/20   Hoyt Koch, MD  pantoprazole (PROTONIX) 40 MG tablet Take 1 tablet (40 mg total) by mouth daily. 07/31/20   Hoyt Koch, MD  travoprost, benzalkonium, (TRAVATAN) 0.004 % ophthalmic solution Place 1 drop into both eyes at bedtime. 10/31/17   Desiree Hane, MD    Allergies    Codeine  Review of Systems   Review of Systems  All other systems reviewed and are negative.  Physical Exam Updated Vital Signs BP (!) 156/106 (BP Location: Right Arm)   Pulse 62   Temp 98.7 F (37.1 C) (Oral)   Resp 14   SpO2 98%   Physical Exam Vitals and nursing note reviewed.  Constitutional:      General: She is not in acute distress.    Appearance: She is well-developed.  HENT:     Head: Atraumatic.  Eyes:     Conjunctiva/sclera: Conjunctivae normal.  Cardiovascular:     Rate and Rhythm: Tachycardia present.  Pulmonary:     Effort: Pulmonary effort is normal.  Abdominal:     Palpations: Abdomen is soft.  Musculoskeletal:        General: Tenderness (Right shoulder: Close deformity can be felt to the [facility] joint with tenderness to palpation.  Decreased shoulder range of motion.  Sensation intact to right arm.) present.     Cervical back: Neck supple.     Comments: No scalp tenderness, or midline spine tenderness on exam.  Able to move all 4 extremities with mildly decreased range of motion to the right shoulder.  Skin:    Findings: No rash.  Neurological:     Mental Status: She is alert.     Comments: Patient is alert to self place and situation, unable to tell me the month and year.  Psychiatric:        Mood and Affect: Mood normal.    ED Results / Procedures / Treatments   Labs (all labs ordered are listed, but only abnormal results are displayed) Labs Reviewed  URINALYSIS, ROUTINE W REFLEX MICROSCOPIC - Abnormal; Notable for the following components:      Result Value   Hgb urine dipstick  SMALL (*)    All other components within normal limits  BASIC METABOLIC PANEL - Abnormal; Notable for the following components:   Glucose, Bld 111 (*)    All other components within normal limits  CBC WITH DIFFERENTIAL/PLATELET - Abnormal; Notable for the following components:   RBC 5.12 (*)    All other components within normal limits  URINALYSIS, MICROSCOPIC (REFLEX) - Abnormal; Notable for the following components:   Bacteria, UA RARE (*)    All other components within normal limits    EKG EKG Interpretation  Date/Time:  Wednesday January 03 2021 08:57:45 EDT Ventricular Rate:  119 PR  Interval:  126 QRS Duration: 101 QT Interval:  335 QTC Calculation: 472 R Axis:   -75 Text Interpretation: Sinus tachycardia Left anterior fascicular block Abnormal R-wave progression, late transition Probable left ventricular hypertrophy Atrial fibrillation Confirmed by Lavenia Atlas [phone]) on 01/03/2021 9:05:15 AM  Radiology DG Shoulder Right  Result Date: 01/03/2021 CLINICAL DATA:  Fall fall, right shoulder pain EXAM: RIGHT SHOULDER - 2+ VIEW COMPARISON:  None. FINDINGS: There is a distal right clavicle fracture. Superior displacement of the clavicle relative to the distal clavicle fragment and AC joint of approximately 2.5 cm. Advanced degenerative changes in the glenohumeral joint with joint space loss and spurring. No proximal humeral fracture. IMPRESSION: Displaced distal right clavicular fracture. Advanced degenerative changes in the glenohumeral joint. Electronically Signed   By: Rolm Baptise M.D.   On: 01/03/2021 09:05    Procedures Procedures   Medications Ordered in ED Medications  acetaminophen (TYLENOL) tablet 1,000 mg (1,000 mg Oral Given 01/03/21 [id number])    ED Course  I have reviewed the triage vital signs and the nursing notes.  Pertinent labs & imaging results that were available during my care of the patient were reviewed by me and considered in my medical decision making (see  chart for details).    MDM Rules/Calculators/A&P                           BP (!) 143/89   Pulse (!) 107   Temp 98.7 F (37.1 C) (Oral)   Resp 20   SpO2 96%   Final Clinical Impression(s) / ED Diagnoses Final diagnoses:  Closed displaced fracture of acromial end of right clavicle, initial encounter    Rx / DC Orders ED Discharge Orders          Ordered    acetaminophen (TYLENOL) 500 MG tablet  Every 6 hours PRN        01/03/21 1112           8:23 AM This is an elderly female who fell while trying to open her door to let her neighbor in.  She landed on her right shoulder and does have tenderness to the right shoulder.  Will obtain x-ray, suspect AC separation.  She does not report any precipitating symptoms however due to her age, and comorbidities, will obtain EKG, basic labs, and UA.  Tylenol given for pain.  Care discussed with Dr. Dina Rich.  9:14 AM X-rays right shoulder demonstrate displaced distal right clavicle fracture.  Advanced generative changes to the glenohumeral joint as well.  Finding is consistent with presenting complaint.  The separation is approximately 2.5 cm.  Due to age, patient is not a candidate for surgical intervention if needed.  Sling provided, orthopedist reconsulted.  EKG shows atrial fibrillation.  She does have history of A. fib with RVR in the past.  11:10 AM Care discussed with orthopedist who recommend nonweightbearing and outpatient follow-up.  Patient discharged home with an order for wheelchair.  Tylenol as needed for pain.  Labs otherwise reviewed unremarkable.  Her pain is well controlled.   Domenic Moras, PA-C 01/03/21 1114    Horton, Alvin Critchley, DO 01/05/21 [phone]

## 2021-01-03 NOTE — ED Triage Notes (Signed)
Patient suffered mechanical fall this am hitting posterior right shoulder. Small hematoma to same. No loc, no other complaints

## 2021-01-05 ENCOUNTER — Other Ambulatory Visit: Payer: Self-pay

## 2021-01-05 ENCOUNTER — Encounter: Payer: Self-pay | Admitting: Surgical

## 2021-01-05 ENCOUNTER — Ambulatory Visit (INDEPENDENT_AMBULATORY_CARE_PROVIDER_SITE_OTHER): Payer: Medicare Other | Admitting: Surgical

## 2021-01-05 DIAGNOSIS — S42031A Displaced fracture of lateral end of right clavicle, initial encounter for closed fracture: Secondary | ICD-10-CM | POA: Diagnosis not present

## 2021-01-05 MED ORDER — TRAMADOL HCL 50 MG PO TABS: 50.0000 mg | ORAL_TABLET | Freq: Two times a day (BID) | ORAL | 0 refills | Status: DC | PRN

## 2021-01-05 NOTE — Progress Notes (Signed)
Office Visit Note   Patient: [name]           Date of Birth: 30-Sep-1921           MRN: [id number] Visit Date: 01/05/2021 Requested by: Hoyt Koch, MD [address] [place],  [place] [address] PCP: Hoyt Koch, MD  Subjective: Chief Complaint  Patient presents with   Right Shoulder - Pain, Fracture    HPI: [name] is a 87 y.o. female who presents to the office complaining of right shoulder pain.  Patient had a fall on Wednesday when she was opening the door for her neighbor and she fell onto her right shoulder.  She was seen in the emergency department where she was diagnosed with displaced clavicle fracture.  She was placed in a sling and told to do no weightbearing with the injured arm.  She is taking Tylenol for pain and overall is controlled aside from occasional bouts of increased pain especially at night.  She is mostly compliant with leaving the sling on.  Denies any other complaints such as other extremity pain, groin pain, hip pain..                ROS: All systems reviewed are negative as they relate to the chief complaint within the history of present illness.  Patient denies fevers or chills.  Assessment & Plan: Visit Diagnoses:  1. Closed displaced fracture of acromial end of right clavicle, initial encounter     Plan: Patient is a 87 year old female who presents complaining of right shoulder pain.  She sustained displaced lateral clavicle fracture 2 days ago with radiographs demonstrating significant displacement superiorly.  No other fractures noted.  She denies any other complaints of injury to any other parts of her body.  Overall she is doing well and plan to continue with sling immobilization and nonweightbearing to the right upper extremity.  No evidence of skin injury or open fracture but plan for patient and her daughter to continue to watch the fracture site daily to ensure that there is no subsequent skin injury.  Follow-up in  10 days for clinical recheck with Dr. Marlou Sa.  New radiographs at that time.  Follow-Up Instructions: No follow-ups on file.   Orders:  No orders of the defined types were placed in this encounter.  Meds ordered this encounter  Medications   traMADol (ULTRAM) 50 MG tablet    Sig: Take 1 tablet (50 mg total) by mouth every 12 (twelve) hours as needed.    Dispense:  30 tablet    Refill:  0      Procedures: No procedures performed   Clinical Data: No additional findings.  Objective: Vital Signs: There were no vitals taken for this visit.  Physical Exam:  Constitutional: Patient appears well-developed HEENT:  Head: Normocephalic Eyes:EOM are normal Neck: Normal range of motion Cardiovascular: Normal rate Pulmonary/chest: Effort normal Neurologic: Patient is alert Skin: Skin is warm Psychiatric: Patient has normal mood and affect  Ortho Exam: Ortho exam demonstrates right shoulder with palpable deformity from displaced lateral clavicle fracture with some tenting of the skin but no evidence of open fracture or injury to the skin.  No erythema noted.  Tenderness over the clavicle fracture with mobility of the displaced shaft.  No pain with elbow or wrist range of motion.  No pain with hip range of motion.  Axillary nerve intact with deltoid firing.  Radial nerve intact with EPL and wrist extension intact.  2+ radial pulse of the right upper extremity.  Specialty Comments:  No specialty comments available.  Imaging: No results found.   PMFS History: Patient Active Problem List   Diagnosis Date Noted   Routine general medical examination at a health care facility 06/29/2019   Aortic valve regurgitation 07/01/2017   Debilitated 08/10/2014   SVT (supraventricular tachycardia) ([place]) 02/23/2014   DJD (degenerative joint disease) 04/07/2013   Breast cancer- s/p mastectomy 03/11/2013   GERD 03/10/2008   GLAUCOMA 09/30/2007   Hypertension 09/30/2007   Past Medical History:   Diagnosis Date   Arthritis    Cancer ([place]) 2011, 2012   left breast cancer 2011 central excision,2012 mastectomy   Complication of anesthesia    shortness of breath after procedure   Glaucoma    Hypertension    Protein-calorie malnutrition, severe ([place])    SVT (supraventricular tachycardia) ([place])    Upper GI bleed 01/2013.    Melena. HPylori negative gastritis on EGD.     Family History  Problem Relation Age of Onset   Coronary artery disease Mother 64       MI    Past Surgical History:  Procedure Laterality Date   ABDOMINAL HYSTERECTOMY     BREAST SURGERY  2011   BREAST SURGERY  2012   lt breast masty   CHOLECYSTECTOMY     ESOPHAGOGASTRODUODENOSCOPY N/A 01/25/2013   Procedure: ESOPHAGOGASTRODUODENOSCOPY (EGD);  Surgeon: Jerene Bears, MD;  Location: [place];  Service: Gastroenterology;  Laterality: N/A;   ESOPHAGOGASTRODUODENOSCOPY (EGD) WITH PROPOFOL  08/04/2014   Procedure: ESOPHAGOGASTRODUODENOSCOPY (EGD) WITH PROPOFOL;  Surgeon: Inda Castle, MD;  Location: [facility] ENDOSCOPY;  Service: Endoscopy;;   Social History   Occupational History    Employer: RETIRED  Tobacco Use   Smoking status: Never   Smokeless tobacco: Never  Vaping Use   Vaping Use: Never used  Substance and Sexual Activity   Alcohol use: No   Drug use: No   Sexual activity: Never

## 2021-01-22 ENCOUNTER — Other Ambulatory Visit: Payer: Self-pay

## 2021-01-22 ENCOUNTER — Ambulatory Visit (INDEPENDENT_AMBULATORY_CARE_PROVIDER_SITE_OTHER): Payer: Medicare Other

## 2021-01-22 ENCOUNTER — Ambulatory Visit (INDEPENDENT_AMBULATORY_CARE_PROVIDER_SITE_OTHER): Payer: Medicare Other | Admitting: Orthopedic Surgery

## 2021-01-22 DIAGNOSIS — S42031A Displaced fracture of lateral end of right clavicle, initial encounter for closed fracture: Secondary | ICD-10-CM

## 2021-01-26 ENCOUNTER — Encounter: Payer: Self-pay | Admitting: Orthopedic Surgery

## 2021-01-26 NOTE — Progress Notes (Signed)
Post-Op Visit Note   Patient: [name]           Date of Birth: 03-03-1922           MRN: [id number] Visit Date: 01/22/2021 PCP: Hoyt Koch, MD   Assessment & Plan:  Chief Complaint:  Chief Complaint  Patient presents with   Other    F/u clavicle fx DOI: 01/03/21   Visit Diagnoses:  1. Closed displaced fracture of acromial end of right clavicle, initial encounter     Plan: [name] is a 87 year old patient with right shoulder lateral clavicle fracture with coracoclavicular ligament disruption now 3 weeks out.  She has been in a sling.  Denies any problems or pain.  She is able to use her arm at times.  On examination she does have prominence but no tenderness of that lateral clavicle region.  Rotator cuff function is intact.  Passive range of motion of the arm is good.  Plan at this time is to discontinue sling.  I do not really want her doing any lifting with that right arm until her final check in 3 weeks.  Okay for her to start using that arm for walker use.  I do not think radiographs would be indicated at the next clinic visit unless she is clinically worse than she is now which is relatively asymptomatic.  Follow-Up Instructions: Return in about 3 weeks (around 02/12/2021).   Orders:  Orders Placed This Encounter  Procedures   XR Clavicle Right   No orders of the defined types were placed in this encounter.   Imaging: No results found.  PMFS History: Patient Active Problem List   Diagnosis Date Noted   Routine general medical examination at a health care facility 06/29/2019   Aortic valve regurgitation 07/01/2017   Debilitated 08/10/2014   SVT (supraventricular tachycardia) ([place]) 02/23/2014   DJD (degenerative joint disease) 04/07/2013   Breast cancer- s/p mastectomy 03/11/2013   GERD 03/10/2008   GLAUCOMA 09/30/2007   Hypertension 09/30/2007   Past Medical History:  Diagnosis Date   Arthritis    Cancer ([place]) 2011, 2012   left breast  cancer 2011 central excision,2012 mastectomy   Complication of anesthesia    shortness of breath after procedure   Glaucoma    Hypertension    Protein-calorie malnutrition, severe ([place])    SVT (supraventricular tachycardia) ([place])    Upper GI bleed 01/2013.    Melena. HPylori negative gastritis on EGD.     Family History  Problem Relation Age of Onset   Coronary artery disease Mother 22       MI    Past Surgical History:  Procedure Laterality Date   ABDOMINAL HYSTERECTOMY     BREAST SURGERY  2011   BREAST SURGERY  2012   lt breast masty   CHOLECYSTECTOMY     ESOPHAGOGASTRODUODENOSCOPY N/A 01/25/2013   Procedure: ESOPHAGOGASTRODUODENOSCOPY (EGD);  Surgeon: Jerene Bears, MD;  Location: [place];  Service: Gastroenterology;  Laterality: N/A;   ESOPHAGOGASTRODUODENOSCOPY (EGD) WITH PROPOFOL  08/04/2014   Procedure: ESOPHAGOGASTRODUODENOSCOPY (EGD) WITH PROPOFOL;  Surgeon: Inda Castle, MD;  Location: [facility] END[facility]COPY;  Service: Endoscopy;;   Social History   Occupational History    Employer: RETIRED  Tobacco Use   Smoking status: Never   Smokeless tobacco: Never  Vaping Use   Vaping Use: Never used  Substance and Sexual Activity   Alcohol use: No   Drug use: No   Sexual activity: Never

## 2021-01-29 ENCOUNTER — Ambulatory Visit (INDEPENDENT_AMBULATORY_CARE_PROVIDER_SITE_OTHER): Payer: Medicare Other | Admitting: Internal Medicine

## 2021-01-29 ENCOUNTER — Other Ambulatory Visit: Payer: Self-pay

## 2021-01-29 ENCOUNTER — Encounter: Payer: Self-pay | Admitting: Internal Medicine

## 2021-01-29 DIAGNOSIS — R21 Rash and other nonspecific skin eruption: Secondary | ICD-10-CM

## 2021-01-29 MED ORDER — TRIAMCINOLONE ACETONIDE 0.1 % EX CREA: 1.0000 "application " | TOPICAL_CREAM | Freq: Two times a day (BID) | CUTANEOUS | 0 refills | Status: DC

## 2021-01-29 NOTE — Patient Instructions (Signed)
We have sent in a cream to use on the face twice a day.

## 2021-01-29 NOTE — Progress Notes (Signed)
   Subjective:   Patient ID: [name], female    DOB: May 14, 1922, 87 y.o.   MRN: [id number]  HPI The patient is a 87 YO female coming in for lesions on her face. Started within a week ago and overall are improving. They were on the forehead and cheeks. Has not been using anything for them.   Review of Systems  Constitutional: Negative.   HENT: Negative.    Eyes: Negative.   Respiratory:  Negative for cough, chest tightness and shortness of breath.   Cardiovascular:  Negative for chest pain, palpitations and leg swelling.  Gastrointestinal:  Negative for abdominal distention, abdominal pain, constipation, diarrhea, nausea and vomiting.  Musculoskeletal:  Positive for arthralgias and gait problem.  Skin:  Positive for color change.  Psychiatric/Behavioral: Negative.     Objective:  Physical Exam Constitutional:      Appearance: She is well-developed.  HENT:     Head: Normocephalic and atraumatic.     Ears:     Comments: Skin lesion consistent with rosacea or eczema on the right cheek more than left Cardiovascular:     Rate and Rhythm: Normal rate and regular rhythm.  Pulmonary:     Effort: Pulmonary effort is normal. No respiratory distress.     Breath sounds: Normal breath sounds. No wheezing or rales.  Abdominal:     General: Bowel sounds are normal. There is no distension.     Palpations: Abdomen is soft.     Tenderness: There is no abdominal tenderness. There is no rebound.  Musculoskeletal:     Cervical back: Normal range of motion.  Skin:    General: Skin is warm and dry.  Neurological:     Mental Status: She is alert and oriented to person, place, and time.     Coordination: Coordination abnormal.     Comments: walker    Vitals:   01/29/21 1049  BP: 130/80  Pulse: 100  Resp: 18  Temp: 98.3 F (36.8 C)  TempSrc: Oral  SpO2: 98%  Weight: 154 lb 3.2 oz (69.9 kg)  Height: '5\' 7"'$  (1.702 m)    This visit occurred during the SARS-CoV-2 public health  emergency.  Safety protocols were in place, including screening questions prior to the visit, additional usage of staff PPE, and extensive cleaning of exam room while observing appropriate contact time as indicated for disinfecting solutions.   Assessment & Plan:

## 2021-01-30 DIAGNOSIS — R21 Rash and other nonspecific skin eruption: Secondary | ICD-10-CM | POA: Insufficient documentation

## 2021-01-30 NOTE — Assessment & Plan Note (Signed)
Rx triamcinolone ointment to use topically for 1-2 week max.

## 2021-02-08 ENCOUNTER — Other Ambulatory Visit: Payer: Self-pay | Admitting: Internal Medicine

## 2021-02-12 ENCOUNTER — Other Ambulatory Visit: Payer: Self-pay

## 2021-02-12 ENCOUNTER — Encounter: Payer: Self-pay | Admitting: Orthopedic Surgery

## 2021-02-12 ENCOUNTER — Ambulatory Visit (INDEPENDENT_AMBULATORY_CARE_PROVIDER_SITE_OTHER): Payer: Medicare Other | Admitting: Orthopedic Surgery

## 2021-02-12 DIAGNOSIS — S42031A Displaced fracture of lateral end of right clavicle, initial encounter for closed fracture: Secondary | ICD-10-CM

## 2021-02-12 NOTE — Progress Notes (Signed)
   Post-Fracture Visit Note   Patient: [name]           Date of Birth: [birth year]           MRN: [id number] Visit Date: 02/12/2021 PCP: Hoyt Koch, MD   Assessment & Plan:  Chief Complaint:  Chief Complaint  Patient presents with   Right Shoulder - Follow-up   Visit Diagnoses:  1. Closed displaced fracture of acromial end of right clavicle, initial encounter     Plan: Patient is a 87 year old female who presents s/p right displaced clavicle fracture on 01/03/2021.  She is ambulating with rolling walker.  Endorses 0 out of 10 pain.  Taking over-the-counter Tylenol for pain control.  No new falls or injuries.  On exam she has 40 degrees external rotation, 85 degrees abduction, 115 degrees forward flexion.  Axillary nerve intact.  No significant tenderness over the fracture site but she still has continued deformity consistent with prior injury with no significant change in the appearance.  No skin injury surrounding the apex of the fracture site.  Plan for her to continue ambulating with walker and okay for lifting light things for her ADLs.  She is doing very well with no significant complaints.  Plan for her to follow-up with the office as needed.  Follow-Up Instructions: No follow-ups on file.   Orders:  No orders of the defined types were placed in this encounter.  No orders of the defined types were placed in this encounter.   Imaging: No results found.  PMFS History: Patient Active Problem List   Diagnosis Date Noted   Rash 01/30/2021   Routine general medical examination at a health care facility 06/29/2019   Aortic valve regurgitation 07/01/2017   Debilitated 08/10/2014   SVT (supraventricular tachycardia) ([place]) 02/23/2014   DJD (degenerative joint disease) 04/07/2013   Breast cancer- s/p mastectomy 03/11/2013   GERD 03/10/2008   GLAUCOMA 09/30/2007   Hypertension 09/30/2007   Past Medical History:  Diagnosis Date   Arthritis    Cancer ([place])  2011, 2012   left breast cancer 2011 central excision,2012 mastectomy   Complication of anesthesia    shortness of breath after procedure   Glaucoma    Hypertension    Protein-calorie malnutrition, severe ([place])    SVT (supraventricular tachycardia) ([place])    Upper GI bleed 01/2013.    Melena. HPylori negative gastritis on EGD.     Family History  Problem Relation Age of Onset   Coronary artery disease Mother 12       MI    Past Surgical History:  Procedure Laterality Date   ABDOMINAL HYSTERECTOMY     BREAST SURGERY  2011   BREAST SURGERY  2012   lt breast masty   CHOLECYSTECTOMY     ESOPHAGOGASTRODUODENOSCOPY N/A 01/25/2013   Procedure: ESOPHAGOGASTRODUODENOSCOPY (EGD);  Surgeon: Jerene Bears, MD;  Location: [place];  Service: Gastroenterology;  Laterality: N/A;   ESOPHAGOGASTRODUODENOSCOPY (EGD) WITH PROPOFOL  08/04/2014   Procedure: ESOPHAGOGASTRODUODENOSCOPY (EGD) WITH PROPOFOL;  Surgeon: Inda Castle, MD;  Location: [facility] ENDOSCOPY;  Service: Endoscopy;;   Social History   Occupational History    Employer: RETIRED  Tobacco Use   Smoking status: Never   Smokeless tobacco: Never  Vaping Use   Vaping Use: Never used  Substance and Sexual Activity   Alcohol use: No   Drug use: No   Sexual activity: Never

## 2021-05-08 ENCOUNTER — Other Ambulatory Visit: Payer: Self-pay | Admitting: Internal Medicine

## 2021-05-11 ENCOUNTER — Other Ambulatory Visit: Payer: Self-pay | Admitting: Internal Medicine

## 2021-07-02 DIAGNOSIS — H401131 Primary open-angle glaucoma, bilateral, mild stage: Secondary | ICD-10-CM | POA: Diagnosis not present

## 2021-07-23 DIAGNOSIS — Z1231 Encounter for screening mammogram for malignant neoplasm of breast: Secondary | ICD-10-CM | POA: Diagnosis not present

## 2021-07-30 ENCOUNTER — Telehealth: Payer: Self-pay | Admitting: Internal Medicine

## 2021-07-30 NOTE — Telephone Encounter (Signed)
N/A unable to leave a message for patient to call back to schedule Medicare Annual Wellness Visit   Last AWV  07/31/21  Please schedule at anytime with [facility] if patient calls the office back.    40 Minutes appointment   Any questions, please call me at [phone]

## 2021-08-06 ENCOUNTER — Other Ambulatory Visit: Payer: Self-pay | Admitting: Internal Medicine

## 2021-08-07 ENCOUNTER — Other Ambulatory Visit: Payer: Self-pay | Admitting: Internal Medicine

## 2021-08-13 ENCOUNTER — Telehealth: Payer: Self-pay | Admitting: Internal Medicine

## 2021-08-13 ENCOUNTER — Ambulatory Visit: Payer: Medicare Other | Admitting: Internal Medicine

## 2021-08-13 MED ORDER — LISINOPRIL 40 MG PO TABS: ORAL_TABLET | ORAL | 0 refills | Status: DC

## 2021-08-13 NOTE — Telephone Encounter (Signed)
1.Medication Requested: ?pantoprazole (PROTONIX) 40 MG tablet ?2. Pharmacy (Name, [place]): ?[place], [facility] Phone:  [phone]  ?Fax:  [phone]  ?  ? ?3. On Med List: y ? ?4. Last Visit with PCP: ? ?5. Next visit date with PCP: ? ?Pt requesting a short refill on medications, as she states she is completely out.  ? ? ?Agent: Please be advised that RX refills may take up to 3 business days. We ask that you follow-up with your pharmacy.  ?

## 2021-08-13 NOTE — Telephone Encounter (Signed)
Refill has been sent to the pt's pharmacy  

## 2021-08-13 NOTE — Telephone Encounter (Signed)
1.Medication Requested: lisinopril (ZESTRIL) 40 MG tablet ? ?2. Pharmacy (Name, [place]):  ?[place] Phone:  [phone]  ?Fax:  [phone]  ?  ? ? ?3. On Med List: yes ? ?4. Last Visit with PCP: 08.29.22 ? ?5. Next visit date with PCP: 03.20.23 ? ? ?Agent: Please be advised that RX refills may take up to 3 business days. We ask that you follow-up with your pharmacy.  ?

## 2021-08-20 ENCOUNTER — Encounter: Payer: Self-pay | Admitting: Internal Medicine

## 2021-08-20 ENCOUNTER — Other Ambulatory Visit: Payer: Self-pay

## 2021-08-20 ENCOUNTER — Ambulatory Visit (INDEPENDENT_AMBULATORY_CARE_PROVIDER_SITE_OTHER): Payer: Medicare Other | Admitting: Internal Medicine

## 2021-08-20 VITALS — BP 124/62 | HR 71 | Resp 18 | Ht 67.0 in | Wt 149.6 lb

## 2021-08-20 DIAGNOSIS — H409 Unspecified glaucoma: Secondary | ICD-10-CM | POA: Diagnosis not present

## 2021-08-20 DIAGNOSIS — M159 Polyosteoarthritis, unspecified: Secondary | ICD-10-CM

## 2021-08-20 DIAGNOSIS — I10 Essential (primary) hypertension: Secondary | ICD-10-CM

## 2021-08-20 DIAGNOSIS — K219 Gastro-esophageal reflux disease without esophagitis: Secondary | ICD-10-CM

## 2021-08-20 DIAGNOSIS — I471 Supraventricular tachycardia: Secondary | ICD-10-CM

## 2021-08-20 DIAGNOSIS — Z0000 Encounter for general adult medical examination without abnormal findings: Secondary | ICD-10-CM | POA: Diagnosis not present

## 2021-08-20 LAB — COMPREHENSIVE METABOLIC PANEL
ALT: 6 U/L (ref 0–35)
AST: 13 U/L (ref 0–37)
Albumin: 4.1 g/dL (ref 3.5–5.2)
Alkaline Phosphatase: 98 U/L (ref 39–117)
BUN: 14 mg/dL (ref 6–23)
CO2: 27 mEq/L (ref 19–32)
Calcium: 9.5 mg/dL (ref 8.4–10.5)
Chloride: 103 mEq/L (ref 96–112)
Creatinine, Ser: 0.92 mg/dL (ref 0.40–1.20)
GFR: 52.18 mL/min — ABNORMAL LOW (ref >60.00)
Glucose, Bld: 79 mg/dL (ref 70–99)
Potassium: 4 mEq/L (ref 3.5–5.1)
Sodium: 139 mEq/L (ref 135–145)
Total Bilirubin: 0.6 mg/dL (ref 0.2–1.2)
Total Protein: 7.3 g/dL (ref 6.0–8.3)

## 2021-08-20 LAB — LIPID PANEL
Cholesterol: 212 mg/dL — ABNORMAL HIGH (ref 0–200)
HDL: 59.5 mg/dL (ref >39.00)
LDL Cholesterol: 136 mg/dL — ABNORMAL HIGH (ref 0–99)
NonHDL: 152.47
Total CHOL/HDL Ratio: 4
Triglycerides: 82 mg/dL (ref 0.0–149.0)
VLDL: 16.4 mg/dL (ref 0.0–40.0)

## 2021-08-20 LAB — CBC
HCT: 40.6 % (ref 36.0–46.0)
Hemoglobin: 13.3 g/dL (ref 12.0–15.0)
MCHC: 32.8 g/dL (ref 30.0–36.0)
MCV: 84.3 fl (ref 78.0–100.0)
Platelets: 431 10*3/uL — ABNORMAL HIGH (ref 150.0–400.0)
RBC: 4.82 Mil/uL (ref 3.87–5.11)
RDW: 15.3 % (ref 11.5–15.5)
WBC: 5.4 10*3/uL (ref 4.0–10.5)

## 2021-08-20 MED ORDER — LISINOPRIL 40 MG PO TABS: ORAL_TABLET | ORAL | 3 refills | Status: DC

## 2021-08-20 MED ORDER — AMLODIPINE BESYLATE 10 MG PO TABS: 10.0000 mg | ORAL_TABLET | Freq: Every day | ORAL | 3 refills | Status: DC

## 2021-08-20 MED ORDER — PANTOPRAZOLE SODIUM 40 MG PO TBEC: 40.0000 mg | DELAYED_RELEASE_TABLET | Freq: Every day | ORAL | 3 refills | Status: DC

## 2021-08-20 MED ORDER — METOPROLOL TARTRATE 50 MG PO TABS: 50.0000 mg | ORAL_TABLET | Freq: Two times a day (BID) | ORAL | 3 refills | Status: DC

## 2021-08-20 NOTE — Assessment & Plan Note (Signed)
Symptoms controlled with protonix 40 mg daily. Will continue and refilled today.  ?

## 2021-08-20 NOTE — Assessment & Plan Note (Signed)
Flu shot yearly. Covid-19 counseled. Pneumonia complete. Shingrix counseled to get at pharmacy. Tetanus counseled due to get at pharmacy. Colonoscopy aged out. Mammogram aged out, pap smear aged out and dexa declines. Counseled about sun safety and mole surveillance. Counseled about the dangers of distracted driving. Given 10 year screening recommendations.  ? ?

## 2021-08-20 NOTE — Assessment & Plan Note (Signed)
Stable and seeing eye doctor every 6 months. ?

## 2021-08-20 NOTE — Assessment & Plan Note (Signed)
Uses rare tramadol last 01/2021 for #30. Using tylenol prn otc and will continue. ?

## 2021-08-20 NOTE — Assessment & Plan Note (Signed)
No recent episodes and is controlled on metoprolol 50 mg BID and will continue. HR 71 which is adequate.  ?

## 2021-08-20 NOTE — Patient Instructions (Signed)
We will check the labs today. 

## 2021-08-20 NOTE — Assessment & Plan Note (Signed)
BP at goal on amlodipine 10 mg daily and lisinopril 40 mg daily and metoprolol 50 mg BID. Checking CMP and adjust as needed.  ?

## 2021-08-20 NOTE — Progress Notes (Signed)
? ?Subjective:  ? ?Patient ID: [name], female    DOB: May 19, 1922, 88 y.o.   MRN: [id number] ? ?HPI ?Here for medicare wellness, no new complaints also for follow up today. Please see A/P for status and treatment of chronic medical problems.  ? ?Diet: heart healthy ?Physical activity: sedentary ?Depression/mood screen: negative ?Hearing: intact to whispered voice, mild loss ?Visual acuity: grossly normal with lens, performs annual eye exam  ?ADLs: capable ?Fall risk: none ?Home safety: good ?Cognitive evaluation: intact to orientation, naming, recall and repetition ?EOL planning: adv directives discussed, not in place ? ?[place] Office Visit from 08/20/2021 in [place] at [place]  ?PHQ-2 Total Score 0  ? ?  ?  ?Flowsheet Row Clinical Support from 08/18/2017 in [place]  ?PHQ-9 Total Score 6  ? ?  ? ?Fall Risk 01/06/2019 01/07/2019 07/31/2020 01/03/2021 08/20/2021  ?Falls in the past year? - 0 1 - 0  ?Was there an injury with Fall? - - 0 - 0  ?Fall Risk Category Calculator - - 1 - 0  ?Fall Risk Category - - Low - Low  ?Patient Fall Risk Level Moderate fall risk High fall risk - Moderate fall risk -  ?Patient at Risk for Falls Due to - - - - -  ?Fall risk Follow up - - - - -  ? ? ?I have personally reviewed and have noted ?1. The patient's medical and social history - reviewed today no changes ?2. Their use of alcohol, tobacco or illicit drugs ?3. Their current medications and supplements ?4. The patient's functional ability including ADL's, fall risks, home safety risks and hearing or visual impairment. ?5. Diet and physical activities ?6. Evidence for depression or mood disorders ?7. Care team reviewed and updated ?8.  The patient is not on an opioid pain medication regularly, has rx for tramadol prn which she is not using currently. ? ?Patient Care Team: ?Hoyt Koch, MD as PCP - General (Internal Medicine) ?Pixie Casino, MD as PCP - Cardiology  (Cardiology) ?Past Medical History:  ?Diagnosis Date  ? Arthritis   ? Cancer [facility]) 2011, 2012  ? left breast cancer 2011 central excision,2012 mastectomy  ? Complication of anesthesia   ? shortness of breath after procedure  ? Glaucoma   ? Hypertension   ? Protein-calorie malnutrition, severe ([place])   ? SVT (supraventricular tachycardia) ([place])   ? Upper GI bleed 01/2013.   ? Melena. HPylori negative gastritis on EGD.   ? ?Past Surgical History:  ?Procedure Laterality Date  ? ABDOMINAL HYSTERECTOMY    ? BREAST SURGERY  2011  ? BREAST SURGERY  2012  ? lt breast masty  ? CHOLECYSTECTOMY    ? ESOPHAGOGASTRODUODENOSCOPY N/A 01/25/2013  ? Procedure: ESOPHAGOGASTRODUODENOSCOPY (EGD);  Surgeon: Jerene Bears, MD;  Location: [place];  Service: Gastroenterology;  Laterality: N/A;  ? ESOPHAGOGASTRODUODENOSCOPY (EGD) WITH PROPOFOL  08/04/2014  ? Procedure: ESOPHAGOGASTRODUODENOSCOPY (EGD) WITH PROPOFOL;  Surgeon: Inda Castle, MD;  Location: [facility] ENDOSCOPY;  Service: Endoscopy;;  ? ?Family History  ?Problem Relation Age of Onset  ? Coronary artery disease Mother 63  ?     MI  ? ?Review of Systems  ?Constitutional: Negative.   ?HENT: Negative.    ?Eyes: Negative.   ?Respiratory:  Negative for cough, chest tightness and shortness of breath.   ?Cardiovascular:  Negative for chest pain, palpitations and leg swelling.  ?Gastrointestinal:  Negative for abdominal distention, abdominal pain, constipation, diarrhea, nausea and vomiting.  ?  Musculoskeletal:  Positive for arthralgias and gait problem.  ?Skin: Negative.   ?Neurological:  Negative for dizziness and headaches.  ?Psychiatric/Behavioral: Negative.    ? ?Objective:  ?Physical Exam ?Constitutional:   ?   Appearance: She is well-developed.  ?HENT:  ?   Head: Normocephalic and atraumatic.  ?Cardiovascular:  ?   Rate and Rhythm: Normal rate and regular rhythm.  ?Pulmonary:  ?   Effort: Pulmonary effort is normal. No respiratory distress.  ?   Breath sounds: Normal breath sounds. No  wheezing or rales.  ?Abdominal:  ?   General: Bowel sounds are normal. There is no distension.  ?   Palpations: Abdomen is soft.  ?   Tenderness: There is no abdominal tenderness. There is no rebound.  ?Musculoskeletal:  ?   Cervical back: Normal range of motion.  ?Skin: ?   General: Skin is warm and dry.  ?Neurological:  ?   Mental Status: She is alert and oriented to person, place, and time.  ?   Coordination: Coordination abnormal.  ?   Comments: Walker  ? ? ?Vitals:  ? 08/20/21 1044 08/20/21 1115  ?BP: 128/90 124/62  ?Pulse: 71   ?Resp: 18   ?SpO2: 97%   ?Weight: 149 lb 9.6 oz (67.9 kg)   ?Height: '5\' 7"'$  (1.702 m)   ? ?This visit occurred during the SARS-CoV-2 public health emergency.  Safety protocols were in place, including screening questions prior to the visit, additional usage of staff PPE, and extensive cleaning of exam room while observing appropriate contact time as indicated for disinfecting solutions.  ? ?Assessment & Plan:  ? ?

## 2021-10-23 ENCOUNTER — Other Ambulatory Visit: Payer: Self-pay

## 2021-10-23 ENCOUNTER — Observation Stay (HOSPITAL_COMMUNITY)
Admission: EM | Admit: 2021-10-23 | Discharge: 2021-10-24 | Disposition: A | Discharge status: Home Health | Payer: Medicare Other | Attending: Internal Medicine | Admitting: Internal Medicine

## 2021-10-23 DIAGNOSIS — Z853 Personal history of malignant neoplasm of breast: Secondary | ICD-10-CM | POA: Insufficient documentation

## 2021-10-23 DIAGNOSIS — N1831 Chronic kidney disease, stage 3a: Secondary | ICD-10-CM | POA: Insufficient documentation

## 2021-10-23 DIAGNOSIS — I471 Supraventricular tachycardia, unspecified: Secondary | ICD-10-CM | POA: Diagnosis present

## 2021-10-23 DIAGNOSIS — Z79899 Other long term (current) drug therapy: Secondary | ICD-10-CM | POA: Insufficient documentation

## 2021-10-23 DIAGNOSIS — Z7982 Long term (current) use of aspirin: Secondary | ICD-10-CM | POA: Diagnosis not present

## 2021-10-23 DIAGNOSIS — K921 Melena: Secondary | ICD-10-CM

## 2021-10-23 DIAGNOSIS — N183 Chronic kidney disease, stage 3 unspecified: Secondary | ICD-10-CM | POA: Diagnosis present

## 2021-10-23 DIAGNOSIS — K625 Hemorrhage of anus and rectum: Principal | ICD-10-CM | POA: Insufficient documentation

## 2021-10-23 DIAGNOSIS — E876 Hypokalemia: Secondary | ICD-10-CM | POA: Insufficient documentation

## 2021-10-23 DIAGNOSIS — Z8719 Personal history of other diseases of the digestive system: Secondary | ICD-10-CM | POA: Diagnosis not present

## 2021-10-23 DIAGNOSIS — I129 Hypertensive chronic kidney disease with stage 1 through stage 4 chronic kidney disease, or unspecified chronic kidney disease: Secondary | ICD-10-CM | POA: Insufficient documentation

## 2021-10-23 DIAGNOSIS — R2681 Unsteadiness on feet: Secondary | ICD-10-CM | POA: Insufficient documentation

## 2021-10-23 DIAGNOSIS — R000 Tachycardia, unspecified: Secondary | ICD-10-CM | POA: Diagnosis not present

## 2021-10-23 DIAGNOSIS — K922 Gastrointestinal hemorrhage, unspecified: Secondary | ICD-10-CM | POA: Diagnosis present

## 2021-10-23 DIAGNOSIS — K219 Gastro-esophageal reflux disease without esophagitis: Secondary | ICD-10-CM | POA: Diagnosis not present

## 2021-10-23 DIAGNOSIS — K5730 Diverticulosis of large intestine without perforation or abscess without bleeding: Secondary | ICD-10-CM | POA: Diagnosis not present

## 2021-10-23 DIAGNOSIS — I10 Essential (primary) hypertension: Secondary | ICD-10-CM | POA: Diagnosis not present

## 2021-10-23 LAB — COMPREHENSIVE METABOLIC PANEL
ALT: 8 U/L (ref 0–44)
AST: 13 U/L — ABNORMAL LOW (ref 15–41)
Albumin: 3.3 g/dL — ABNORMAL LOW (ref 3.5–5.0)
Alkaline Phosphatase: 86 U/L (ref 38–126)
Anion gap: 9 (ref 5–15)
BUN: 20 mg/dL (ref 8–23)
CO2: 23 mmol/L (ref 22–32)
Calcium: 8.6 mg/dL — ABNORMAL LOW (ref 8.9–10.3)
Chloride: 108 mmol/L (ref 98–111)
Creatinine, Ser: 1.01 mg/dL — ABNORMAL HIGH (ref 0.44–1.00)
GFR, Estimated: 51 mL/min — ABNORMAL LOW (ref >60)
Glucose, Bld: 109 mg/dL — ABNORMAL HIGH (ref 70–99)
Potassium: 3.4 mmol/L — ABNORMAL LOW (ref 3.5–5.1)
Sodium: 140 mmol/L (ref 135–145)
Total Bilirubin: 0.6 mg/dL (ref 0.3–1.2)
Total Protein: 6.3 g/dL — ABNORMAL LOW (ref 6.5–8.1)

## 2021-10-23 LAB — CBC
HCT: 37 % (ref 36.0–46.0)
Hemoglobin: 12 g/dL (ref 12.0–15.0)
MCH: 27.7 pg (ref 26.0–34.0)
MCHC: 32.4 g/dL (ref 30.0–36.0)
MCV: 85.5 fL (ref 80.0–100.0)
Platelets: 354 10*3/uL (ref 150–400)
RBC: 4.33 MIL/uL (ref 3.87–5.11)
RDW: 14.6 % (ref 11.5–15.5)
WBC: 7.2 10*3/uL (ref 4.0–10.5)
nRBC: 0 % (ref 0.0–0.2)

## 2021-10-23 LAB — HEMOGLOBIN AND HEMATOCRIT, BLOOD
HCT: 38.5 % (ref 36.0–46.0)
Hemoglobin: 11.8 g/dL — ABNORMAL LOW (ref 12.0–15.0)

## 2021-10-23 LAB — TYPE AND SCREEN
ABO/RH(D): B POS
Antibody Screen: NEGATIVE

## 2021-10-23 LAB — MAGNESIUM: Magnesium: 2.1 mg/dL (ref 1.7–2.4)

## 2021-10-23 LAB — POC OCCULT BLOOD, ED: Fecal Occult Bld: POSITIVE — AB

## 2021-10-23 MED ORDER — SODIUM CHLORIDE 0.9 % IV SOLN: INTRAVENOUS | Status: DC

## 2021-10-23 MED ORDER — ALBUTEROL SULFATE (2.5 MG/3ML) 0.083% IN NEBU: 2.5000 mg | INHALATION_SOLUTION | Freq: Four times a day (QID) | RESPIRATORY_TRACT | Status: DC | PRN

## 2021-10-23 MED ORDER — LATANOPROST 0.005 % OP SOLN
1.0000 [drp] | Freq: Every day | OPHTHALMIC | Status: DC
  Filled 2021-10-23: qty 2.5

## 2021-10-23 MED ORDER — METOPROLOL TARTRATE 50 MG PO TABS
50.0000 mg | ORAL_TABLET | Freq: Two times a day (BID) | ORAL | Status: DC
  Administered 2021-10-23 – 2021-10-24 (×3): 50 mg via ORAL
  Filled 2021-10-23: qty 2
  Filled 2021-10-23 (×2): qty 1

## 2021-10-23 MED ORDER — POTASSIUM CHLORIDE CRYS ER 20 MEQ PO TBCR
20.0000 meq | EXTENDED_RELEASE_TABLET | ORAL | Status: AC
  Administered 2021-10-23: 20 meq via ORAL
  Filled 2021-10-23: qty 1

## 2021-10-23 MED ORDER — SODIUM CHLORIDE 0.9 % IV BOLUS
500.0000 mL | Freq: Once | INTRAVENOUS | Status: AC
  Administered 2021-10-23: 500 mL via INTRAVENOUS

## 2021-10-23 MED ORDER — SODIUM CHLORIDE 0.9% FLUSH: 3.0000 mL | Freq: Two times a day (BID) | INTRAVENOUS | Status: DC

## 2021-10-23 MED ORDER — ACETAMINOPHEN 325 MG PO TABS: 650.0000 mg | ORAL_TABLET | Freq: Four times a day (QID) | ORAL | Status: DC | PRN

## 2021-10-23 MED ORDER — MELATONIN 5 MG PO TABS
5.0000 mg | ORAL_TABLET | Freq: Once | ORAL | Status: AC
  Administered 2021-10-24: 5 mg via ORAL
  Filled 2021-10-23: qty 1

## 2021-10-23 MED ORDER — PANTOPRAZOLE SODIUM 40 MG PO TBEC
40.0000 mg | DELAYED_RELEASE_TABLET | Freq: Every day | ORAL | Status: DC
  Administered 2021-10-23 – 2021-10-24 (×2): 40 mg via ORAL
  Filled 2021-10-23 (×2): qty 1

## 2021-10-23 MED ORDER — LISINOPRIL 20 MG PO TABS
40.0000 mg | ORAL_TABLET | Freq: Every day | ORAL | Status: DC
Start: 2021-10-23 — End: 2021-10-24
  Administered 2021-10-23 – 2021-10-24 (×2): 40 mg via ORAL
  Filled 2021-10-23 (×2): qty 2

## 2021-10-23 MED ORDER — SODIUM CHLORIDE 0.9 % IV SOLN: Freq: Once | INTRAVENOUS | Status: AC

## 2021-10-23 MED ORDER — ACETAMINOPHEN 650 MG RE SUPP: 650.0000 mg | Freq: Four times a day (QID) | RECTAL | Status: DC | PRN

## 2021-10-23 NOTE — H&P (Signed)
History and Physical    Patient: [name] ACZ:[id number] DOB: [birth year] DOA: 10/23/2021 DOS: the patient was seen and examined on 10/23/2021 PCP: Hoyt Koch, MD  Patient coming from: Home  Chief Complaint:  Chief Complaint  Patient presents with   Rectal Bleeding   HPI: [name] is a 88 y.o. female with medical history significant of hypertension, aortic valve regurgitation, SVT, prior GI bleed, breast cancer, and GERD presents with complaints of rectal bleeding starting this morning around 6:45 AM.  At baseline patient lives at home and gets around with use of walker.  This morning patient was noted to have incontinence of stool prior to getting to the toilet was noted to be dark red and thick.  When she got to the bathroom toilet she passed another large volume of dark red blood.  She denied having any significant abdominal pain, nausea, or vomiting.  She is on aspirin 81 mg twice weekly and does not use any other NSAIDs. Review of previous hospitalizations note prior GI bleed in 2014 thought secondary to NSAIDs and diverticulitis per EGD noted gastritis, and subsequent GI bleed in 2016.  She had not undergone colonoscopy due to her age.  Upon admission into the emergency department patient was noted to be tachycardic with heart rates 10 4-1 20, and all other vital signs maintained.  Labs noted hemoglobin 12, potassium 3.4, BUN 20, and creatinine 1.01.  Rectal exam revealed clotted blood was guaiac positive.  Patient was typed and screened for possible need of blood products.  She was given normal saline 500 mL bolus x1 dose and then placed on rate of 125 mL/h.  [facility] have been consulted.  Review of Systems: As mentioned in the history of present illness. All other systems reviewed and are negative. Past Medical History:  Diagnosis Date   Arthritis    Cancer ([place]) 2011, 2012   left breast cancer 2011 central excision,2012 mastectomy   Complication of anesthesia     shortness of breath after procedure   Glaucoma    Hypertension    Protein-calorie malnutrition, severe ([place])    SVT (supraventricular tachycardia) ([place])    Upper GI bleed 01/2013.    Melena. HPylori negative gastritis on EGD.    Past Surgical History:  Procedure Laterality Date   ABDOMINAL HYSTERECTOMY     BREAST SURGERY  2011   BREAST SURGERY  2012   lt breast masty   CHOLECYSTECTOMY     ESOPHAGOGASTRODUODENOSCOPY N/A 01/25/2013   Procedure: ESOPHAGOGASTRODUODENOSCOPY (EGD);  Surgeon: Jerene Bears, MD;  Location: [facility];  Service: Gastroenterology;  Laterality: N/A;   ESOPHAGOGASTRODUODENOSCOPY (EGD) WITH PROPOFOL  08/04/2014   Procedure: ESOPHAGOGASTRODUODENOSCOPY (EGD) WITH PROPOFOL;  Surgeon: Inda Castle, MD;  Location: [facility];  Service: Endoscopy;;   Social History:  reports that she has never smoked. She has never used smokeless tobacco. She reports that she does not drink alcohol and does not use drugs.  Allergies  Allergen Reactions   Codeine Nausea And Vomiting    Family History  Problem Relation Age of Onset   Coronary artery disease Mother 31       MI    Prior to Admission medications   Medication Sig Start Date End Date Taking? Authorizing Provider  acetaminophen (TYLENOL) 500 MG tablet Take 2 tablets (1,000 mg total) by mouth every 6 (six) hours as needed for moderate pain (for pain). 01/03/21   Domenic Moras, PA-C  amLODipine (NORVASC) 10 MG tablet Take 1  tablet (10 mg total) by mouth daily. 08/20/21   Hoyt Koch, MD  aspirin EC 81 MG tablet Take 1 tablet (81 mg total) by mouth daily. 10/31/17   Oretha Milch D, MD  diclofenac sodium (VOLTAREN) 1 % GEL Apply 2 g topically See admin instructions. Apply 2 grams to both knees for pain four times a day 10/31/17   Oretha Milch D, MD  latanoprost (XALATAN) 0.005 % ophthalmic solution Place 1 drop into both eyes at bedtime. 06/17/18   [provider]  lisinopril (ZESTRIL) 40 MG tablet TAKE  1 TABLET(40 MG) BY MOUTH DAILY 08/20/21   Hoyt Koch, MD  metoprolol tartrate (LOPRESSOR) 50 MG tablet Take 1 tablet (50 mg total) by mouth 2 (two) times daily. 08/20/21   Hoyt Koch, MD  pantoprazole (PROTONIX) 40 MG tablet Take 1 tablet (40 mg total) by mouth daily. 08/20/21   Hoyt Koch, MD  traMADol (ULTRAM) 50 MG tablet Take 1 tablet (50 mg total) by mouth every 12 (twelve) hours as needed. 01/05/21   Magnant, Charles L, PA-C  travoprost, benzalkonium, (TRAVATAN) 0.004 % ophthalmic solution Place 1 drop into both eyes at bedtime. 10/31/17   Oretha Milch D, MD  triamcinolone cream (KENALOG) 0.1 % Apply 1 application topically 2 (two) times daily. 01/29/21   Hoyt Koch, MD    Physical Exam: Vitals:   10/23/21 0900 10/23/21 0945  BP: 126/88 (!) 149/91  Pulse: (!) 120 (!) 104  Resp: 20 15  Temp: 98.5 F (36.9 C)   TempSrc: Oral   SpO2: 100% 99%   Constitutional: Elderly female currently in no acute distress Eyes: PERRL, lids and conjunctivae normal ENMT: Mucous membranes are moist. Posterior pharynx clear of any exudate or lesions. Poor dentition Neck: normal, supple, no masses, no thyromegaly Respiratory: clear to auscultation bilaterally, no wheezing, no crackles.  Cardiovascular: Regular rate and rhythm, no murmurs / rubs / gallops.  Trace lower extremity edema. 2+ pedal pulses. No carotid bruits.  Abdomen: no tenderness, no masses palpated. Bowel sounds positive.  Musculoskeletal: no clubbing / cyanosis. No joint deformity upper and lower extremities.  Skin: no rashes, lesions, ulcers. No induration Neurologic: CN 2-12 grossly intact. Strength 5/5 in all 4.  Psychiatric: Mild memory impairment.  Otherwise alert and oriented to person and place, and situation.  Data Reviewed:  EKG reveals a junctional tachycardia at 126 bpm.  Assessment and Plan: GI bleed Acute.  Patient presents with reports of rectal bleeding starting this morning.   Noted to have clotted blood on rectal exam and was not thought to be melanotic.  She was noted to be tachycardic but blood pressures currently stable with hemoglobin 12.  Previous history of GI bleeds in 2014 related with NSAID use and possible diverticulosis, and GI bleed in2016 -Admit to a medical telemetry bed -Monitor intake and output -Clear liquid diet -Hold aspirin -Serial monitoring of H&H every 6 hours x4 -Transfuse blood products as needed for hemoglobins less than 7 g/dL or clinically symptomatic -[facility] consulted,  will follow-up for further recommendations -It was recommended patient follow-up with cardiology to consider need of discontinuation of aspirin due to recurrent GI bleeds. -GI recommended conservative management and obtain nuclear RBC tagged scan if patient develops current episode of rectal bleeding.  Hypokalemia Acute.  Potassium was 3.4 on admission. -Give 20 mEq of potassium chloride p.o. -Continue to monitor and replace as needed  History of paroxysmal SVT Upon admission heart rate elevated up to 126 concerning  for junctional tachycardia. -Continue beta-blocker  Essential hypertension Home blood pressure regimen appears to include metoprolol 50 mg twice daily and lisinopril 40 mg daily based off most recent fill dates. -Continue metoprolol and lisinopril  Chronic kidney disease stage IIIa Creatinine 1.01 with BUN 20 on admission.  Baseline creatinine noted to be around 0.8-0.9. -Continue to monitor kidney function.  GERD history of GI bleed Prior EGD noted moderate atrophic gastritis in 2014.  Colonoscopies had not been pursued recently due to patient's age. -Continue Protonix 40 mg daily  Med reconciliation still pending from pharmacy.  Medications added for most recent fill dates.  Will need to reassess once completed.  DVT prophylaxis: SCDs Advance Care Planning:   Code Status: Full Code   Consults: [facility] Family Communication: Family  updated  Severity of Illness: The appropriate patient status for this patient is OBSERVATION. Observation status is judged to be reasonable and necessary in order to provide the required intensity of service to ensure the patient's safety. The patient's presenting symptoms, physical exam findings, and initial radiographic and laboratory data in the context of their medical condition is felt to place them at decreased risk for further clinical deterioration. Furthermore, it is anticipated that the patient will be medically stable for discharge from the hospital within 2 midnights of admission.   Author: Norval Morton, MD 10/23/2021 10:19 AM  For on call review [email or web address].

## 2021-10-23 NOTE — ED Notes (Signed)
Patient changed into clean brief. Provided patient with clean linen and placed a new purick. Family is at bedside.

## 2021-10-23 NOTE — ED Provider Notes (Signed)
[facility] EMERGENCY DEPARTMENT Provider Note   CSN: [id number] Arrival date & time: 10/23/21  3546     History  Chief Complaint  Patient presents with   Rectal Bleeding    [name] is a 88 y.o. female.   Rectal Bleeding Associated symptoms: no fever    Patient has history of GERD, hypertension, SVT, aortic valve regurgitation, and prior GI bleeding.  Patient went to the bathroom this morning and passed several large clots of blood.  She has had this problem in the past.  She has not had any abdominal pain.  No fevers.  No vomiting.  Patient was brought in by her son for further evaluation  Home Medications Prior to Admission medications   Medication Sig Start Date End Date Taking? Authorizing Provider  acetaminophen (TYLENOL) 500 MG tablet Take 2 tablets (1,000 mg total) by mouth every 6 (six) hours as needed for moderate pain (for pain). 01/03/21   Domenic Moras, PA-C  amLODipine (NORVASC) 10 MG tablet Take 1 tablet (10 mg total) by mouth daily. 08/20/21   Hoyt Koch, MD  aspirin EC 81 MG tablet Take 1 tablet (81 mg total) by mouth daily. 10/31/17   Oretha Milch D, MD  diclofenac sodium (VOLTAREN) 1 % GEL Apply 2 g topically See admin instructions. Apply 2 grams to both knees for pain four times a day 10/31/17   Oretha Milch D, MD  latanoprost (XALATAN) 0.005 % ophthalmic solution Place 1 drop into both eyes at bedtime. 06/17/18   [provider]  lisinopril (ZESTRIL) 40 MG tablet TAKE 1 TABLET(40 MG) BY MOUTH DAILY 08/20/21   Hoyt Koch, MD  metoprolol tartrate (LOPRESSOR) 50 MG tablet Take 1 tablet (50 mg total) by mouth 2 (two) times daily. 08/20/21   Hoyt Koch, MD  pantoprazole (PROTONIX) 40 MG tablet Take 1 tablet (40 mg total) by mouth daily. 08/20/21   Hoyt Koch, MD  traMADol (ULTRAM) 50 MG tablet Take 1 tablet (50 mg total) by mouth every 12 (twelve) hours as needed. 01/05/21   Magnant, Charles L,  PA-C  travoprost, benzalkonium, (TRAVATAN) 0.004 % ophthalmic solution Place 1 drop into both eyes at bedtime. 10/31/17   Oretha Milch D, MD  triamcinolone cream (KENALOG) 0.1 % Apply 1 application topically 2 (two) times daily. 01/29/21   Hoyt Koch, MD      Allergies    Codeine    Review of Systems   Review of Systems  Constitutional:  Negative for fever.  Gastrointestinal:  Positive for hematochezia.   Physical Exam Updated Vital Signs BP (!) 149/91   Pulse (!) 104   Temp 98.5 F (36.9 C) (Oral)   Resp 15   SpO2 99%  Physical Exam Vitals and nursing note reviewed.  Constitutional:      General: She is not in acute distress.    Appearance: She is well-developed.  HENT:     Head: Normocephalic and atraumatic.     Right Ear: External ear normal.     Left Ear: External ear normal.  Eyes:     General: No scleral icterus.       Right eye: No discharge.        Left eye: No discharge.     Conjunctiva/sclera: Conjunctivae normal.  Neck:     Trachea: No tracheal deviation.  Cardiovascular:     Rate and Rhythm: Regular rhythm. Tachycardia present.  Pulmonary:     Effort: Pulmonary effort is normal.  No respiratory distress.     Breath sounds: Normal breath sounds. No stridor. No wheezing or rales.  Abdominal:     General: Bowel sounds are normal. There is no distension.     Palpations: Abdomen is soft.     Tenderness: There is no abdominal tenderness. There is no guarding or rebound.  Genitourinary:    Comments: No mass appreciated, large amount of maroon-colored blood noted on rectal exam Musculoskeletal:        General: No tenderness or deformity.     Cervical back: Neck supple.  Skin:    General: Skin is warm and dry.     Findings: No rash.  Neurological:     General: No focal deficit present.     Mental Status: She is alert.     Cranial Nerves: No cranial nerve deficit (no facial droop, extraocular movements intact, no slurred speech).     Sensory: No  sensory deficit.     Motor: No abnormal muscle tone or seizure activity.     Coordination: Coordination normal.  Psychiatric:        Mood and Affect: Mood normal.    ED Results / Procedures / Treatments   Labs (all labs ordered are listed, but only abnormal results are displayed) Labs Reviewed  COMPREHENSIVE METABOLIC PANEL - Abnormal; Notable for the following components:      Result Value   Potassium 3.4 (*)    Glucose, Bld 109 (*)    Creatinine, Ser 1.01 (*)    Calcium 8.6 (*)    Total Protein 6.3 (*)    Albumin 3.3 (*)    AST 13 (*)    GFR, Estimated 51 (*)    All other components within normal limits  POC OCCULT BLOOD, ED - Abnormal; Notable for the following components:   Fecal Occult Bld POSITIVE (*)    All other components within normal limits  CBC  HEMOGLOBIN AND HEMATOCRIT, BLOOD  HEMOGLOBIN AND HEMATOCRIT, BLOOD  MAGNESIUM  TYPE AND SCREEN    EKG EKG Interpretation  Date/Time:  Tuesday Oct 23 2021 08:54:12 EDT Ventricular Rate:  126 PR Interval:    QRS Duration: 96 QT Interval:  328 QTC Calculation: 475 R Axis:   -74 Text Interpretation: Junctional tachycardia Left anterior fascicular block Probable left ventricular hypertrophy No significant change since last tracing Confirmed by Dorie Rank [phone]) on 10/23/2021 9:22:22 AM  Radiology No results found.  Procedures Procedures    Medications Ordered in ED Medications  sodium chloride 0.9 % bolus 500 mL (0 mLs Intravenous Stopped 10/23/21 0953)    And  0.9 %  sodium chloride infusion ( Intravenous New Bag/Given 10/23/21 0954)  sodium chloride flush (NS) 0.9 % injection 3 mL (has no administration in time range)  acetaminophen (TYLENOL) tablet 650 mg (has no administration in time range)    Or  acetaminophen (TYLENOL) suppository 650 mg (has no administration in time range)  albuterol (PROVENTIL) (2.5 MG/3ML) 0.083% nebulizer solution 2.5 mg (has no administration in time range)  potassium chloride SA  (KLOR-CON M) CR tablet 20 mEq (has no administration in time range)  metoprolol tartrate (LOPRESSOR) tablet 50 mg (has no administration in time range)  pantoprazole (PROTONIX) EC tablet 40 mg (has no administration in time range)    ED Course/ Medical Decision Making/ A&P Clinical Course as of 10/23/21 1046  Tue Oct 23, 2021  0924 CBC Normal [JK]  0924 Comprehensive metabolic panel(!) Potassium decreased [JK]  0925 POC occult blood, ED(!) Positive [  JK]  1029 Case discussed with Dr Tamala Julian regarding admission [JK]  1046 Discussed with [facility] [JK]    Clinical Course User Index [JK] Dorie Rank, MD                           Medical Decision Making Problems Addressed: Rectal bleeding: acute illness or injury that poses a threat to life or bodily functions Tachycardia: acute illness or injury that poses a threat to life or bodily functions  Amount and/or Complexity of Data Reviewed External Data Reviewed: notes.    Details: Prior GI eval, [facility] Labs: ordered. Decision-making details documented in ED Course.  Risk Prescription drug management. Decision regarding hospitalization.   Patient presents ED for evaluation of acute onset rectal bleeding.  Patient has large amount of maroon stool on rectal exam.  Hemoglobin is stable at this time and no indication for transfusion but she is at risk for serious blood loss.  I will consult with gastroenterology and the medical service to plan for admission for serial hemoglobins, further evaluation.  Patient also noted to be tachycardic.  Previous records reviewed and patient has history for SVT and some question of A-fib.  Patient does have recurrent tachycardia here in the ED.  Heart rate has decreased.  Now 104.  Still appears to be somewhat irregular.  We will continue to monitor.  No indication for emergent cardioversion at this time        Final Clinical Impression(s) / ED Diagnoses Final diagnoses:  Rectal bleeding   Tachycardia    Rx / DC Orders ED Discharge Orders     None         Dorie Rank, MD 10/23/21 1046

## 2021-10-23 NOTE — Progress Notes (Signed)
Patient arrived to [facility] room 22 alert and oriented, pain level 0/10, bed in lowest position, call light in reach, daughter at bedside. Will continue to monitor pt.

## 2021-10-23 NOTE — Consult Note (Addendum)
Referring Provider: Dr. Fuller Plan  Primary Care Physician:  Hoyt Koch, MD Primary Gastroenterologist:  Dr.Pyrtle  Reason for Consultation: Hematochezia  HPI: [name] is a 88 y.o. female with a past medical history of arthritis, hypertension, SVT, aortic valve regurgitation,  breast cancer s/p lumpectomy 2011 and left mastectomy in 2012,  upper GI bleed/melena 2014 secondary to NSAIDS and diverticulosis.   She developed rectal bleeding with blood clots.  She presented to [facility] ED today for further evaluation.  Labs in the ED showed a hemoglobin level of 12 (baseline Hg level 12.4 - 13.3).  Hematocrit 37.  Platelet 400.  BUN 20 (baseline BUN 14).  Creatinine 1.01.  Sodium 140.  Potassium 3.4.  Albumin 3.3.  AST 13.  ALT 8.  Total bili 0.6.  Alk phos 86.  FOBT positive. Rectal exam by the ED physician identified a large amount of maroon-colored blood. She was mildly tachycardic and received normal saline 500 cc IV bolus.  No further hematochezia since arriving to the ED.  A GI consult was requested for further evaluation regarding painless hematochezia.  Her son and daughter at the bedside.  She felt well upon awakening this morning. She the urge to pass a bowel movement and walk to the bathroom and passed a large amount of darker red blood with clots on the bathroom floor then sat on the commode and passed a large volume of dark red blood.  No bright red blood.  Her son thought the blood on the bathroom floor looked black.  No associated abdominal or rectal pain.  No dysphagia or heartburn.  No chest pain, dizziness or shortness of breath.  She takes ASA 81 mg 2 days weekly.  No other NSAID use.  She takes pantoprazole 40 mg p.o. daily.  She typically has chronic constipation.  She typically passes a formed brown bowel movement every 2 to 3 days.  No heartburn or dysphagia.   She has a history of upper GI bleed/melena with + NSAID use which required hospital  admission 01/2013.  She underwent an EGD during this hospitalization which identified moderate chronic active gastritis.  He was advised to avoid NSAIDs  She was also admitted to the hospital 08/04/2014 with a GI bleed. At that time, she reported passing dark red blood followed by black stools.  Her BUN level was elevated. An upper GI bleed was suspected.  An EGD was done which was unrevealing.  A colonoscopy was not done due to her age.  CTAP was done 08/05/2014 which identified significant diverticular changes in the sigmoid colon without any worrisome colon mass.  It was later thought that she most likely had a diverticular bleed.  She may have undergone one colonoscopy in past but further details are unknown.  EGD 08/04/2014 Esophageal web  EGD 01/25/2013: The mucosa of the esophagus appeared normal Gastritis was found in the incisura and the gastric antrum and pyloric region of the stomach, multiple biopsies The duodenal mucosa showed no abnormalities in the bulb and second portion of the duodenum 1. Stomach, biopsy - MODERATE CHRONIC ACTIVE ATROPHIC GASTRITIS WITH INTESTINAL METAPLASIA - WARTHIN STARRY STAIN NEGATIVE FOR H. PYLORI. 2. Stomach, biopsy, Incisura - MODERATE CHRONIC ACTIVE ATROPHIC GASTRITIS WITH INTESTINAL METAPLASIA. - NEGATIVE FOR DYSPLASIA OR MALIGNANCY  Past Medical History:  Diagnosis Date   Arthritis    Cancer ([place]) 2011, 2012   left breast cancer 2011 central excision,2012 mastectomy   Complication of anesthesia    shortness  of breath after procedure   Glaucoma    Hypertension    Protein-calorie malnutrition, severe (HCC)    SVT (supraventricular tachycardia) ([place])    Upper GI bleed 01/2013.    Melena. HPylori negative gastritis on EGD.     Past Surgical History:  Procedure Laterality Date   ABDOMINAL HYSTERECTOMY     BREAST SURGERY  2011   BREAST SURGERY  2012   lt breast masty   CHOLECYSTECTOMY     ESOPHAGOGASTRODUODENOSCOPY N/A 01/25/2013   Procedure:  ESOPHAGOGASTRODUODENOSCOPY (EGD);  Surgeon: Jerene Bears, MD;  Location: [place];  Service: Gastroenterology;  Laterality: N/A;   ESOPHAGOGASTRODUODENOSCOPY (EGD) WITH PROPOFOL  08/04/2014   Procedure: ESOPHAGOGASTRODUODENOSCOPY (EGD) WITH PROPOFOL;  Surgeon: Inda Castle, MD;  Location: [place];  Service: Endoscopy;;    Prior to Admission medications   Medication Sig Start Date End Date Taking? Authorizing Provider  acetaminophen (TYLENOL) 500 MG tablet Take 2 tablets (1,000 mg total) by mouth every 6 (six) hours as needed for moderate pain (for pain). 01/03/21   Domenic Moras, PA-C  amLODipine (NORVASC) 10 MG tablet Take 1 tablet (10 mg total) by mouth daily. 08/20/21   Hoyt Koch, MD  aspirin EC 81 MG tablet Take 1 tablet (81 mg total) by mouth daily. 10/31/17   Oretha Milch D, MD  diclofenac sodium (VOLTAREN) 1 % GEL Apply 2 g topically See admin instructions. Apply 2 grams to both knees for pain four times a day 10/31/17   Oretha Milch D, MD  latanoprost (XALATAN) 0.005 % ophthalmic solution Place 1 drop into both eyes at bedtime. 06/17/18   [provider]  lisinopril (ZESTRIL) 40 MG tablet TAKE 1 TABLET(40 MG) BY MOUTH DAILY 08/20/21   Hoyt Koch, MD  metoprolol tartrate (LOPRESSOR) 50 MG tablet Take 1 tablet (50 mg total) by mouth 2 (two) times daily. 08/20/21   Hoyt Koch, MD  pantoprazole (PROTONIX) 40 MG tablet Take 1 tablet (40 mg total) by mouth daily. 08/20/21   Hoyt Koch, MD  traMADol (ULTRAM) 50 MG tablet Take 1 tablet (50 mg total) by mouth every 12 (twelve) hours as needed. 01/05/21   Magnant, Charles L, PA-C  travoprost, benzalkonium, (TRAVATAN) 0.004 % ophthalmic solution Place 1 drop into both eyes at bedtime. 10/31/17   Oretha Milch D, MD  triamcinolone cream (KENALOG) 0.1 % Apply 1 application topically 2 (two) times daily. 01/29/21   Hoyt Koch, MD    Current Facility-Administered Medications  Medication  Dose Route Frequency Provider Last Rate Last Admin   0.9 %  sodium chloride infusion   Intravenous Continuous Norval Morton, MD 125 mL/hr at 10/23/21 0954 New Bag at 10/23/21 0954   acetaminophen (TYLENOL) tablet 650 mg  650 mg Oral Q6H PRN Norval Morton, MD       Or   acetaminophen (TYLENOL) suppository 650 mg  650 mg Rectal Q6H PRN Fuller Plan A, MD       albuterol (PROVENTIL) (2.5 MG/3ML) 0.083% nebulizer solution 2.5 mg  2.5 mg Nebulization Q6H PRN Tamala Julian, Rondell A, MD       metoprolol tartrate (LOPRESSOR) tablet 50 mg  50 mg Oral BID Tamala Julian, Rondell A, MD   50 mg at 10/23/21 1112   pantoprazole (PROTONIX) EC tablet 40 mg  40 mg Oral Daily Smith, Rondell A, MD   40 mg at 10/23/21 1113   sodium chloride flush (NS) 0.9 % injection 3 mL  3 mL Intravenous Q12H Smith, Rondell  A, MD       Current Outpatient Medications  Medication Sig Dispense Refill   acetaminophen (TYLENOL) 500 MG tablet Take 2 tablets (1,000 mg total) by mouth every 6 (six) hours as needed for moderate pain (for pain). 30 tablet 0   amLODipine (NORVASC) 10 MG tablet Take 1 tablet (10 mg total) by mouth daily. 90 tablet 3   aspirin EC 81 MG tablet Take 1 tablet (81 mg total) by mouth daily.     diclofenac sodium (VOLTAREN) 1 % GEL Apply 2 g topically See admin instructions. Apply 2 grams to both knees for pain four times a day     latanoprost (XALATAN) 0.005 % ophthalmic solution Place 1 drop into both eyes at bedtime.     lisinopril (ZESTRIL) 40 MG tablet TAKE 1 TABLET(40 MG) BY MOUTH DAILY 90 tablet 3   metoprolol tartrate (LOPRESSOR) 50 MG tablet Take 1 tablet (50 mg total) by mouth 2 (two) times daily. 180 tablet 3   pantoprazole (PROTONIX) 40 MG tablet Take 1 tablet (40 mg total) by mouth daily. 90 tablet 3   traMADol (ULTRAM) 50 MG tablet Take 1 tablet (50 mg total) by mouth every 12 (twelve) hours as needed. 30 tablet 0   travoprost, benzalkonium, (TRAVATAN) 0.004 % ophthalmic solution Place 1 drop into both eyes  at bedtime. 2.5 mL 12   triamcinolone cream (KENALOG) 0.1 % Apply 1 application topically 2 (two) times daily. 100 g 0    Allergies as of 10/23/2021 - Review Complete 10/23/2021  Allergen Reaction Noted   Codeine Nausea And Vomiting     Family History  Problem Relation Age of Onset   Coronary artery disease Mother 55       MI    Social History   Socioeconomic History   Marital status: Widowed    Spouse name: Not on file   Number of children: 7   Years of education: 8   Highest education level: Not on file  Occupational History    Employer: RETIRED  Tobacco Use   Smoking status: Never   Smokeless tobacco: Never  Vaping Use   Vaping Use: Never used  Substance and Sexual Activity   Alcohol use: No   Drug use: No   Sexual activity: Never  Other Topics Concern   Not on file  Social History Narrative   8th grade. Married '40 - 52 yrs/widowed. 5 sons, 2 daughters; 6 grandchildren; 12 great grands.   Lives alone and manages ADLs except driving. Family is very supportive. ACP -discussed with her and referred to "[employer]. Org." (Oct '13)   Social Determinants of Health   Financial Resource Strain: Not on file  Food Insecurity: Not on file  Transportation Needs: Not on file  Physical Activity: Not on file  Stress: Not on file  Social Connections: Not on file  Intimate Partner Violence: Not on file    Review of Systems: Gen: Denies fever, sweats or chills. No weight loss.  CV: Denies chest pain, palpitations or edema. Resp: Denies cough, shortness of breath of hemoptysis.  GI: See HPI. GU : Denies urinary burning, blood in urine, increased urinary frequency or incontinence. MS: Denies joint pain, muscles aches or weakness. Derm: Denies rash, itchiness, skin lesions or unhealing ulcers. Psych: Denies depression, anxiety or significant memory loss.  Heme: Denies easy bruising, bleeding. Neuro:  Denies headaches, dizziness or paresthesias. Endo:  Denies  any problems with DM, thyroid or adrenal function.  Physical Exam: Vital signs  in last 24 hours: Temp:  [98.5 F (36.9 C)] 98.5 F (36.9 C) (05/23 0900) Pulse Rate:  [104-120] 110 (05/23 1112) Resp:  [15-20] 15 (05/23 0945) BP: (126-149)/(88-91) 149/91 (05/23 0945) SpO2:  [99 %-100 %] 99 % (05/23 0945)   General:  Alert 88 year old female in no acute distress Head:  Normocephalic and atraumatic. Eyes:  No scleral icterus. Conjunctiva pink. Ears:  Normal auditory acuity. Nose:  No deformity, discharge or lesions. Mouth: Missing upper dentition.  Poor lower dentition.  No ulcers or lesions.  Neck:  Supple. No lymphadenopathy or thyromegaly.  Lungs: Breath sounds clear throughout. Heart: Regular rate and rhythm, no murmurs. Abdomen: Soft, nondistended.  Nontender.  Positive bowel sounds to all 4 quadrants.  No palpable mass. Rectal: Deferred. Musculoskeletal:  Symmetrical without gross deformities.  Pulses:  Normal pulses noted. Extremities:  Without clubbing or edema. Neurologic:  Alert and  oriented x 4. No focal deficits.  Skin:  Intact without significant lesions or rashes. Psych:  Alert and cooperative. Normal mood and affect.  Intake/Output from previous day: No intake/output data recorded. Intake/Output this shift: Total I/O In: 500 [IV Piggyback:500] Out: -   Lab Results: Recent Labs    10/23/21 0844  WBC 7.2  HGB 12.0  HCT 37.0  PLT 354   BMET Recent Labs    10/23/21 0844  NA 140  K 3.4*  CL 108  CO2 23  GLUCOSE 109*  BUN 20  CREATININE 1.01*  CALCIUM 8.6*   LFT Recent Labs    10/23/21 0844  PROT 6.3*  ALBUMIN 3.3*  AST 13*  ALT 8  ALKPHOS 86  BILITOT 0.6   PT/INR No results for input(s): LABPROT, INR in the last 72 hours. Hepatitis Panel No results for input(s): HEPBSAG, HCVAB, HEPAIGM, HEPBIGM in the last 72 hours.    Studies/Results: No results found.  IMPRESSION/PLAN:  5) 88 year old female a history of a lower GI bleed,  likely diverticular etiology in 2016 presented to the ED today with painless hematochezia, suspect recurrent diverticular bleed. Hg 12 (baseline Hg 12.4 - 13.3).  No further hematochezia since arriving to the ED.  Initially mildly tachycardic which resolved after receiving IV fluids. -Clear liquid diet -Monitor H/H closely  -CBC, BMP in am  -Transfuse for Hg < 7 or as needed if she becomes symptomatic  -Recommend a tagged red blood cell scan if she demonstrates active GI bleeding  -Conservative/supportive care  -No plans for endoscopic evaluation at this time  -Patient to follow up with cardiology to consider discontinuing ASA as she is at risk for recurrent GI bleeding.  -Await further recommendations per Dr. Silverio Decamp  2) History of GERD and UGI bleed/melena secondary to NSAIDs in 2014. EGD 01/2013 showed moderate atrophic gastritis.  -Pantoprazole $RemoveBefore'40mg'[id number]$  po QD    Noralyn Pick  10/23/2021, 1:03PM   Attending physician's note  I have taken a history, reviewed the chart and examined the patient. I performed a substantive portion of this encounter, including complete performance of at least one of the key components, in conjunction with the APP. I agree with the APP's note, impression and recommendations.    88 year old very pleasant female with history of breast cancer s/p mastectomy, sigmoid diverticulosis, GI bleed in the setting of NSAIDs in 2014 presented after acute episode of hematochezia  She is currently hemodynamically stable.  Hemoglobin 12 Medical plan for conservative management and avoid any invasive procedures or endoscopic evaluation as per patient Monitor hemoglobin and transfuse if  below 7.  If develops recurrent episode of large-volume hematochezia, obtain RBC nuclear med tagged scan to isolate and possible CTA to embolize.  Most likely etiology is diverticular hemorrhage  Continue supportive care   The patient was provided an opportunity to ask questions and  all were answered. The patient agreed with the plan and demonstrated an understanding of the instructions.  Damaris Hippo , MD [phone]

## 2021-10-23 NOTE — ED Triage Notes (Signed)
Son stated, rectal bleeding, she has clots from there. Its happened multiple times

## 2021-10-24 DIAGNOSIS — K922 Gastrointestinal hemorrhage, unspecified: Secondary | ICD-10-CM | POA: Diagnosis not present

## 2021-10-24 DIAGNOSIS — N1831 Chronic kidney disease, stage 3a: Secondary | ICD-10-CM | POA: Diagnosis not present

## 2021-10-24 DIAGNOSIS — K219 Gastro-esophageal reflux disease without esophagitis: Secondary | ICD-10-CM | POA: Diagnosis not present

## 2021-10-24 DIAGNOSIS — E876 Hypokalemia: Secondary | ICD-10-CM | POA: Diagnosis not present

## 2021-10-24 DIAGNOSIS — K625 Hemorrhage of anus and rectum: Secondary | ICD-10-CM

## 2021-10-24 DIAGNOSIS — N183 Chronic kidney disease, stage 3 unspecified: Secondary | ICD-10-CM

## 2021-10-24 DIAGNOSIS — Z8719 Personal history of other diseases of the digestive system: Secondary | ICD-10-CM | POA: Diagnosis not present

## 2021-10-24 DIAGNOSIS — I471 Supraventricular tachycardia: Secondary | ICD-10-CM | POA: Diagnosis not present

## 2021-10-24 DIAGNOSIS — I10 Essential (primary) hypertension: Secondary | ICD-10-CM | POA: Diagnosis not present

## 2021-10-24 LAB — BASIC METABOLIC PANEL
Anion gap: 6 (ref 5–15)
BUN: 16 mg/dL (ref 8–23)
CO2: 23 mmol/L (ref 22–32)
Calcium: 8.3 mg/dL — ABNORMAL LOW (ref 8.9–10.3)
Chloride: 111 mmol/L (ref 98–111)
Creatinine, Ser: 0.8 mg/dL (ref 0.44–1.00)
GFR, Estimated: >60 mL/min (ref >60)
Glucose, Bld: 92 mg/dL (ref 70–99)
Potassium: 3.7 mmol/L (ref 3.5–5.1)
Sodium: 140 mmol/L (ref 135–145)

## 2021-10-24 LAB — HEMOGLOBIN AND HEMATOCRIT, BLOOD
HCT: 31.4 % — ABNORMAL LOW (ref 36.0–46.0)
HCT: 30.7 % — ABNORMAL LOW (ref 36.0–46.0)
Hemoglobin: 10.3 g/dL — ABNORMAL LOW (ref 12.0–15.0)
Hemoglobin: 10.3 g/dL — ABNORMAL LOW (ref 12.0–15.0)

## 2021-10-24 NOTE — TOC Initial Note (Signed)
Transition of Care [facility]) - Initial/Assessment Note    Patient Details  Name: [name] MRN: [id number] Date of Birth: 08/02/1921  [facility]) CM/SW Contact:    Marilu Favre, RN Phone Number: 10/24/2021, 12:33 PM  Clinical Narrative:                 Damaris Schooner to patient and grand daughter at bedside. Confirmed face sheet information.   Patient from home with family. Has walker and 3 in 1. No preference in home health agency. Hoyle Sauer with [facility] accepted referral .   Best contact number is patient's daughter Berenice Primas [phone]   Expected Discharge Plan: [place] Barriers to Discharge: No Barriers Identified   Patient Goals and CMS Choice Patient states their goals for this hospitalization and ongoing recovery are:: to return to home CMS Medicare.gov Compare Post Acute Care list provided to:: Patient Choice offered to / list presented to : Patient  Expected Discharge Plan and Services Expected Discharge Plan: [place]   Discharge Planning Services: CM Consult Post Acute Care Choice: [place] arrangements for the past 2 months: Single Family Home Expected Discharge Date: 10/24/21               DME Arranged: N/A DME Agency: NA       HH Arranged: RN, PT, OT, Nurse's Aide [place] Agency: Other - See comment ([place]) Date HH Agency Contacted: 10/24/21 Time [place]: 1232 Representative spoke with at [place]: Hoyle Sauer  Prior Living Arrangements/Services Living arrangements for the past 2 months: [place] Lives with:: Adult Children Patient language and need for interpreter reviewed:: Yes Do you feel safe going back to the place where you live?: Yes      Need for Family Participation in Patient Care: Yes (Comment) Care giver support system in place?: Yes (comment) Current home services: DME Criminal Activity/Legal Involvement Pertinent to Current  Situation/Hospitalization: No - Comment as needed  Activities of Daily Living      Permission Sought/Granted   Permission granted to share information with : Yes, Verbal Permission Granted  Share Information with NAME: Grand daughter           Emotional Assessment Appearance:: Appears stated age Attitude/Demeanor/Rapport: Engaged Affect (typically observed): Accepting Orientation: : Oriented to Self, Oriented to Place, Oriented to  Time, Oriented to Situation Alcohol / Substance Use: Not Applicable Psych Involvement: No (comment)  Admission diagnosis:  Rectal bleeding [K62.5] Tachycardia [R00.0] GI bleed [K92.2] Patient Active Problem List   Diagnosis Date Noted   GI bleed 10/23/2021   Hypokalemia 10/23/2021   Chronic kidney disease, stage III (moderate) ([place]) 10/23/2021   Rash 01/30/2021   Routine general medical examination at a health care facility 06/29/2019   Aortic valve regurgitation 07/01/2017   Debilitated 08/10/2014   PSVT  02/23/2014   History of GI bleed- Aug 2014 (NSAIDs) 02/23/2014   SVT (supraventricular tachycardia) ([place]) 02/23/2014   DJD (degenerative joint disease) 04/07/2013   Breast cancer- s/p mastectomy 03/11/2013   GERD 03/10/2008   Unspecified glaucoma 09/30/2007   Hypertension 09/30/2007   PCP:  Hoyt Koch, MD Pharmacy:   [facility] [address] [place] [address] Phone: [phone] Fax: [place], [place] [facility] [place] [facility] [place] [address] Phone: [phone] Fax: [phone]  Social Determinants of Health (SDOH) Interventions    Readmission Risk Interventions     View : No data to display.

## 2021-10-24 NOTE — Discharge Summary (Signed)
Physician Discharge Summary  [name]:[id number] DOB: 06-28-1921 DOA: 10/23/2021  PCP: Hoyt Koch, MD  Admit date: 10/23/2021 Discharge date: 10/24/2021  Admitted From: Home Disposition: Home  Recommendations for Outpatient Follow-up:  Follow up with PCP in 1-2 weeks Please obtain CBC in one week to assess hemoglobin level Continue to hold aspirin for at least 1 week until repeat CBC accomplished, then consider restarting.  Home Health: PT/RN/aide Equipment/Devices: None  Discharge Condition: Stable CODE STATUS: Full code Diet recommendation: Heart healthy diet  History of present illness:  [name] is a 88 y.o. female with medical history significant of hypertension, aortic valve regurgitation, SVT, prior GI bleed, breast cancer, and GERD presents with complaints of rectal bleeding starting this morning around 6:45 AM.  At baseline patient lives at home and gets around with use of walker.  This morning patient was noted to have incontinence of stool prior to getting to the toilet was noted to be dark red and thick.  When she got to the bathroom toilet she passed another large volume of dark red blood.  She denied having any significant abdominal pain, nausea, or vomiting.  She is on aspirin 81 mg twice weekly and does not use any other NSAIDs. Review of previous hospitalizations note prior GI bleed in 2014 thought secondary to NSAIDs and diverticulitis per EGD noted gastritis, and subsequent GI bleed in 2016.  She had not undergone colonoscopy due to her age.   Upon admission into the emergency department patient was noted to be tachycardic with heart rates 10 4-1 20, and all other vital signs maintained.  Labs noted hemoglobin 12, potassium 3.4, BUN 20, and creatinine 1.01.  Rectal exam revealed clotted blood was guaiac positive.  Patient was typed and screened for possible need of blood products.  She was given normal saline 500 mL bolus x1 dose and then  placed on rate of 125 mL/h.  [facility] have been consulted.  Hospital course:  GI bleed likely secondary to diverticular Patient presenting with reports of rectal bleeding.  She was noted to have clotted blood on rectal exam with positive FOBT.  Hemoglobin 12 on admission.  Patient with history of GI bleeds 2014 due to NSAID use and possible diverticulosis.  Starting this morning.  Noted to have clotted blood on rectal exam and was not thought to be melanotic.  She was noted to be tachycardic but blood pressures currently stable with hemoglobin 12.  Previous history of GI bleeds in 2014 and 2016 related with NSAID use and possible diverticulosis.  Patient's aspirin was held.  Gastroenterology was consulted and followed during hospital course.  GI plan conservative management and avoiding any invasive procedures or endoscopic evaluation with agreement of patient and family.  Hemoglobin was monitored every 6 hours and remained stable.  Seen by GI in follow-up and okay for discharge home.  We will continue to hold aspirin on discharge for at least 1 week with outpatient follow-up PCP with repeat labs to ensure hemoglobin stable.   Hypokalemia Repleted during hospitalization.   History of paroxysmal SVT Continue Toprol tartrate 50 mg p.o. twice daily   Essential hypertension Continue amlodipine 10 mg p.o. daily, metoprolol tartrate 50 g p.o. twice daily, lisinopril 40 mg p.o. daily.  Chronic kidney disease stage IIIa Creatinine 1.01 with BUN 20 on admission.  Baseline creatinine noted to be around 0.8-0.9.   GERD History of GI bleed Prior EGD noted moderate atrophic gastritis in 2014.  Colonoscopies had not  been pursued recently due to patient's age. Continue Protonix 40 mg daily  Weakness/deconditioning/gait disturbance: Seen by PT with recommendations of home health.  Discharge Diagnoses:  Principal Problem:   GI bleed Active Problems:   Hypokalemia   PSVT    Hypertension   GERD    History of GI bleed- Aug 2014 (NSAIDs)   Chronic kidney disease, stage III (moderate) (HCC)    Discharge Instructions  Discharge Instructions     Call MD for:  difficulty breathing, headache or visual disturbances   Complete by: As directed    Call MD for:  extreme fatigue   Complete by: As directed    Call MD for:  persistant dizziness or light-headedness   Complete by: As directed    Call MD for:  persistant nausea and vomiting   Complete by: As directed    Call MD for:  severe uncontrolled pain   Complete by: As directed    Call MD for:  temperature >100.4   Complete by: As directed    Diet - low sodium heart healthy   Complete by: As directed    Increase activity slowly   Complete by: As directed       Allergies as of 10/24/2021       Reactions   Codeine Nausea And Vomiting        Medication List     STOP taking these medications    aspirin EC 81 MG tablet       TAKE these medications    acetaminophen 500 MG tablet Commonly known as: TYLENOL Take 2 tablets (1,000 mg total) by mouth every 6 (six) hours as needed for moderate pain (for pain).   amLODipine 10 MG tablet Commonly known as: NORVASC Take 1 tablet (10 mg total) by mouth daily.   diclofenac sodium 1 % Gel Commonly known as: VOLTAREN Apply 2 g topically See admin instructions. Apply 2 grams to both knees for pain four times a day What changed:  when to take this reasons to take this additional instructions   latanoprost 0.005 % ophthalmic solution Commonly known as: XALATAN Place 1 drop into both eyes at bedtime.   lisinopril 40 MG tablet Commonly known as: ZESTRIL TAKE 1 TABLET(40 MG) BY MOUTH DAILY What changed:  how much to take how to take this when to take this additional instructions   metoprolol tartrate 50 MG tablet Commonly known as: LOPRESSOR Take 1 tablet (50 mg total) by mouth 2 (two) times daily.   pantoprazole 40 MG tablet Commonly known as: PROTONIX Take 1  tablet (40 mg total) by mouth daily.   triamcinolone cream 0.1 % Commonly known as: KENALOG Apply 1 application topically 2 (two) times daily. What changed:  when to take this reasons to take this        Follow-up Information     Hoyt Koch, MD. Schedule an appointment as soon as possible for a visit in 1 week(s).   Specialty: Internal Medicine Contact information: [place] [address] [phone]         Pixie Casino, MD .   Specialty: Cardiology Contact information: [address] [place] [address] [phone]                Allergies  Allergen Reactions   Codeine Nausea And Vomiting    Consultations: [facility]   Procedures/Studies: No results found.   Subjective: Patient seen examined bedside, resting comfortably.  No specific complaints this  morning.  Daughter present at bedside.  Hemoglobin stable and GI now signed off and okay for discharge home.  Discussed with patient and daughter to hold aspirin for least another week until follows up with PCP with repeat labs.  No other questions or concerns at this time.  Denies headache, no dizziness, no chest pain, no palpitations, no shortness of breath, no abdominal pain, no fever/chills/night sweats, no nausea/vomiting/diarrhea, no fatigue, no paresthesias.  No acute events overnight per nursing staff.  Discharge Exam: Vitals:   10/24/21 0457 10/24/21 0807  BP: (!) 153/78 (!) 147/76  Pulse: (!) 59 61  Resp: 16 18  Temp: 98 F (36.7 C) 98.3 F (36.8 C)  SpO2: 100% 100%   Vitals:   10/23/21 1936 10/24/21 0028 10/24/21 0457 10/24/21 0807  BP: (!) 162/76 (!) 159/81 (!) 153/78 (!) 147/76  Pulse: 69 64 (!) 59 61  Resp: '17 16 16 18  '$ Temp: 98.4 F (36.9 C) 98.1 F (36.7 C) 98 F (36.7 C) 98.3 F (36.8 C)  TempSrc: Oral Oral Oral Oral  SpO2: 100% 100% 100% 100%    Physical Exam: GEN: NAD, alert and oriented x 3, elderly in  appearance HEENT: NCAT, PERRL, EOMI, sclera clear, MMM PULM: CTAB w/o wheezes/crackles, normal respiratory effort, on room air CV: RRR w/o M/G/R GI: abd soft, NTND, NABS, no R/G/M MSK: no peripheral edema, muscle strength globally intact 5/5 bilateral upper/lower extremities NEURO: CN II-XII intact, no focal deficits, sensation to light touch intact PSYCH: normal mood/affect Integumentary: dry/intact, no rashes or wounds    The results of significant diagnostics from this hospitalization (including imaging, microbiology, ancillary and laboratory) are listed below for reference.     Microbiology: No results found for this or any previous visit (from the past 240 hour(s)).   Labs: BNP (last 3 results) No results for input(s): BNP in the last 8760 hours. Basic Metabolic Panel: Recent Labs  Lab 10/23/21 0844 10/23/21 1259 10/24/21 0620  NA 140  --  140  K 3.4*  --  3.7  CL 108  --  111  CO2 23  --  23  GLUCOSE 109*  --  92  BUN 20  --  16  CREATININE 1.01*  --  0.80  CALCIUM 8.6*  --  8.3*  MG  --  2.1  --    Liver Function Tests: Recent Labs  Lab 10/23/21 0844  AST 13*  ALT 8  ALKPHOS 86  BILITOT 0.6  PROT 6.3*  ALBUMIN 3.3*   No results for input(s): LIPASE, AMYLASE in the last 168 hours. No results for input(s): AMMONIA in the last 168 hours. CBC: Recent Labs  Lab 10/23/21 0844 10/23/21 1259 10/23/21 1801 10/24/21 0139 10/24/21 0620  WBC 7.2  --   --   --   --   HGB 12.0 11.8* SPECIMEN CLOTTED 10.3* 10.3*  HCT 37.0 38.5 SPECIMEN CLOTTED 31.4* 30.7*  MCV 85.5  --   --   --   --   PLT 354  --   --   --   --    Cardiac Enzymes: No results for input(s): CKTOTAL, CKMB, CKMBINDEX, TROPONINI in the last 168 hours. BNP: Invalid input(s): POCBNP CBG: No results for input(s): GLUCAP in the last 168 hours. D-Dimer No results for input(s): DDIMER in the last 72 hours. Hgb A1c No results for input(s): HGBA1C in the last 72 hours. Lipid Profile No results  for input(s): CHOL, HDL, LDLCALC, TRIG, CHOLHDL, LDLDIRECT in the last  72 hours. Thyroid function studies No results for input(s): TSH, T4TOTAL, T3FREE, THYROIDAB in the last 72 hours.  Invalid input(s): FREET3 Anemia work up No results for input(s): VITAMINB12, FOLATE, FERRITIN, TIBC, IRON, RETICCTPCT in the last 72 hours. Urinalysis    Component Value Date/Time   COLORURINE YELLOW 01/03/2021 0906   APPEARANCEUR CLEAR 01/03/2021 0906   LABSPEC 1.015 01/03/2021 0906   PHURINE 7.0 01/03/2021 0906   GLUCOSEU NEGATIVE 01/03/2021 0906   GLUCOSEU NEGATIVE 12/05/2016 1533   HGBUR SMALL (A) 01/03/2021 0906   BILIRUBINUR NEGATIVE 01/03/2021 0906   BILIRUBINUR Neg 12/08/2017 1434   KETONESUR NEGATIVE 01/03/2021 0906   PROTEINUR NEGATIVE 01/03/2021 0906   UROBILINOGEN 0.2 12/08/2017 1434   UROBILINOGEN 1.0 12/05/2016 1533   NITRITE NEGATIVE 01/03/2021 0906   LEUKOCYTESUR NEGATIVE 01/03/2021 0906   Sepsis Labs Invalid input(s): PROCALCITONIN,  WBC,  LACTICIDVEN Microbiology No results found for this or any previous visit (from the past 240 hour(s)).   Time coordinating discharge: Over 30 minutes  SIGNED:   Donnamarie Poag [place] DO  [facility] 10/24/2021, 11:54 AM

## 2021-10-24 NOTE — Consult Note (Addendum)
Daily Progress Note  Hospital Day: 2  Chief Complaint: rectal bleeding  Brief History [name] is a 88 y.o. female with a pmh not limited to arthritis, hypertension, SVT, aortic valve regurgitation,  breast cancer s/p lumpectomy 2011 and left mastectomy in 2012, atrophic gastritis,  upper GI bleed/melena 2014 secondary to NSAIDS and diverticulosis. Admitted with rectal bleeding   Assessment   88 yo female with painless hematochezia, likely diverticular hemorrhage. Resolved Normal BUN  Acute blood loss anemia. Baseline hgb 12 >> down but stable at 10.3.    Plan   Stable for discharge from GI standpoint.    Subjective: No further bleeding. Had a small loose BM without blood this am per family member  Imaging:  No results found.  Lab Results: Recent Labs    10/23/21 0844 10/23/21 1259 10/23/21 1801 10/24/21 0139 10/24/21 0620  WBC 7.2  --   --   --   --   HGB 12.0   < > SPECIMEN CLOTTED 10.3* 10.3*  HCT 37.0   < > SPECIMEN CLOTTED 31.4* 30.7*  PLT 354  --   --   --   --    < > = values in this interval not displayed.   BMET Recent Labs    10/23/21 0844 10/24/21 0620  NA 140 140  K 3.4* 3.7  CL 108 111  CO2 23 23  GLUCOSE 109* 92  BUN 20 16  CREATININE 1.01* 0.80  CALCIUM 8.6* 8.3*   LFT Recent Labs    10/23/21 0844  PROT 6.3*  ALBUMIN 3.3*  AST 13*  ALT 8  ALKPHOS 86  BILITOT 0.6   PT/INR No results for input(s): LABPROT, INR in the last 72 hours.   Scheduled inpatient medications:   latanoprost  1 drop Both Eyes QHS   lisinopril  40 mg Oral Daily   metoprolol tartrate  50 mg Oral BID   pantoprazole  40 mg Oral Daily   sodium chloride flush  3 mL Intravenous Q12H   Continuous inpatient infusions:  PRN inpatient medications: acetaminophen **OR** acetaminophen, albuterol  Vital signs in last 24 hours: Temp:  [97.5 F (36.4 C)-98.4 F (36.9 C)] 98.3 F (36.8 C) (05/24 0807) Pulse Rate:  [57-110] 61 (05/24 0807) Resp:   [15-18] 18 (05/24 0807) BP: (146-178)/(76-93) 147/76 (05/24 0807) SpO2:  [100 %] 100 % (05/24 0807) Last BM Date : 10/24/21  Intake/Output Summary (Last 24 hours) at 10/24/2021 1051 Last data filed at 10/23/2021 2100 Gross per 24 hour  Intake 565.82 ml  Output 1300 ml  Net -734.18 ml     Physical Exam:  General: Alert female in NAD Heart:  Regular rate and rhythm. Pulmonary: Normal respiratory effort Abdomen: Soft, nondistended, nontender. Normal bowel sounds.  Neurologic: Alert and oriented Psych: Pleasant. Cooperative.    Intake/Output from previous day: 05/23 0701 - 05/24 0700 In: 1065.8 [P.O.:120; I.V.:445.8; IV Piggyback:500] Out: 1300 [Urine:1300] Intake/Output this shift: No intake/output data recorded.    Principal Problem:   GI bleed Active Problems:   Hypertension   GERD   PSVT    History of GI bleed- Aug 2014 (NSAIDs)   Hypokalemia   Chronic kidney disease, stage III (moderate) ([facility])     LOS: 0 days   Tye Savoy ,NP 10/24/2021, 10:51 AM     Attending physician's note   I have taken a history, reviewed the chart and examined the patient. I performed a substantive portion of this encounter, including complete  performance of at least one of the key components, in conjunction with the APP. I agree with the APP's note, impression and recommendations.    No further episodes of hematochezia.  She is hemodynamically stable Hemoglobin drifted down to 10 Patient will need to clarify the need for long-term low-dose aspirin with PMD and cardiology.  Currently on hold.  She does have history of recurrent GI bleed.  Will need to assess benefit risk ratio with long-term low-dose aspirin use Follow-up with GI as needed  The patient was provided an opportunity to ask questions and all were answered. The patient agreed with the plan and demonstrated an understanding of the instructions.   Damaris Hippo , MD [phone]

## 2021-10-24 NOTE — Evaluation (Signed)
Physical Therapy Evaluation [name] MRN: [id number] DOB: May 04, 1922 Today's Date: 10/24/2021  History of Present Illness  Pt is a 88 y/o female admitted secondary to GI bleed. PMH includes HTN, CKD, breast cancer, and  Clinical Impression  Pt admitted secondary to problem above with deficits below. Pt requiring min to min guard A for mobility tasks using RW. Pt's granddaughter present and reports pt lives with her daughter and has a neighbor to come and sit with her while her daughter is at work. Recommending HHPT at d/c to address mobility deficits and would benefit from aide for ADL tasks. Will continue to follow acutely.        Recommendations for follow up therapy are one component of a multi-disciplinary discharge planning process, led by the attending physician.  Recommendations may be updated based on patient status, additional functional criteria and insurance authorization.  Follow Up Recommendations Home health PT    Assistance Recommended at Discharge Frequent or constant Supervision/Assistance  Patient can return home with the following  A little help with walking and/or transfers;A little help with bathing/dressing/bathroom;Assistance with cooking/housework;Direct supervision/assist for medications management;Direct supervision/assist for financial management;Assist for transportation;Help with stairs or ramp for entrance    Equipment Recommendations None recommended by PT  Recommendations for Other Services       Functional Status Assessment Patient has had a recent decline in their functional status and demonstrates the ability to make significant improvements in function in a reasonable and predictable amount of time.     Precautions / Restrictions Precautions Precautions: Fall Restrictions Weight Bearing Restrictions: No      Mobility  Bed Mobility Overal bed mobility: Needs Assistance Bed Mobility: Supine to Sit     Supine to  sit: Min assist     General bed mobility comments: Min A for LE assist    Transfers Overall transfer level: Needs assistance Equipment used: Rolling walker (2 wheels) Transfers: Sit to/from Stand Sit to Stand: Min assist, Min guard           General transfer comment: Min A initially, but progressed to min guard A for transfers with RW.    Ambulation/Gait Ambulation/Gait assistance: Min guard Gait Distance (Feet): 75 Feet Assistive device: Rolling walker (2 wheels) Gait Pattern/deviations: Step-through pattern, Decreased stride length, Trunk flexed       General Gait Details: Min guard for safety. Cues for proximity to device and upright posture. Very slow and cautious.  Stairs            Wheelchair Mobility    Modified Rankin (Stroke Patients Only)       Balance Overall balance assessment: Needs assistance Sitting-balance support: No upper extremity supported, Feet supported Sitting balance-Leahy Scale: Fair     Standing balance support: Bilateral upper extremity supported Standing balance-Leahy Scale: Poor Standing balance comment: Reliant on BUE support                             Pertinent Vitals/Pain Pain Assessment Pain Assessment: No/denies pain    Home Living Family/patient expects to be discharged to:: Private residence Living Arrangements: Children Available Help at Discharge: Family;Neighbor;Available 24 hours/day Type of Home: House Home Access: Stairs to enter Entrance Stairs-Rails: Left Entrance Stairs-Number of Steps: 2   Home Layout: One level Home Equipment: BSC/3in1;Rolling Walker (2 wheels)      Prior Function Prior Level of Function : Needs assist  Mobility Comments: Uses RW for mobility; needs assist for steps ADLs Comments: Daughter assists with bathing and dressing as needed.     Hand Dominance        Extremity/Trunk Assessment   Upper Extremity Assessment Upper Extremity Assessment:  Defer to OT evaluation    Lower Extremity Assessment Lower Extremity Assessment: Generalized weakness    Cervical / Trunk Assessment Cervical / Trunk Assessment: Kyphotic  Communication   Communication: No difficulties  Cognition Arousal/Alertness: Awake/alert Behavior During Therapy: WFL for tasks assessed/performed Overall Cognitive Status: History of cognitive impairments - at baseline                                 General Comments: Family reports hx of cog impairment        General Comments      Exercises     Assessment/Plan    PT Assessment Patient needs continued PT services  PT Problem List Decreased strength;Decreased balance;Decreased activity tolerance;Decreased mobility;Decreased knowledge of use of DME;Decreased knowledge of precautions       PT Treatment Interventions DME instruction;Gait training;Functional mobility training;Stair training;Therapeutic exercise;Therapeutic activities;Balance training;Patient/family education    PT Goals (Current goals can be found in the Care Plan section)  Acute Rehab PT Goals Patient Stated Goal: to go home PT Goal Formulation: With patient Time For Goal Achievement: 11/07/21 Potential to Achieve Goals: Good    Frequency Min 3X/week     Co-evaluation               AM-PAC PT "6 Clicks" Mobility  Outcome Measure Help needed turning from your back to your side while in a flat bed without using bedrails?: None Help needed moving from lying on your back to sitting on the side of a flat bed without using bedrails?: A Little Help needed moving to and from a bed to a chair (including a wheelchair)?: A Little Help needed standing up from a chair using your arms (e.g., wheelchair or bedside chair)?: A Little Help needed to walk in hospital room?: A Little Help needed climbing 3-5 steps with a railing? : A Lot 6 Click Score: 18    End of Session Equipment Utilized During Treatment: Gait  belt Activity Tolerance: Patient tolerated treatment well Patient left: in chair;with call bell/phone within reach;with family/visitor present Nurse Communication: Mobility status PT Visit Diagnosis: Unsteadiness on feet (R26.81);Muscle weakness (generalized) (M62.81)    Time: 1020-1055 PT Time Calculation (min) (ACUTE ONLY): 35 min   Charges:   PT Evaluation $PT Eval Low Complexity: 1 Low PT Treatments $Gait Training: 8-22 mins        Lou Miner, DPT  Acute Rehabilitation Services  Pager: [phone] Office: [phone]   Rudean Hitt 10/24/2021, 11:23 AM

## 2021-10-25 ENCOUNTER — Telehealth: Payer: Self-pay | Admitting: Internal Medicine

## 2021-10-25 NOTE — Telephone Encounter (Signed)
Called in requesting orders for skilled Nursing, PT, OT and home health aide on discharge paperwork upon leaving hospital.

## 2021-10-26 NOTE — Telephone Encounter (Signed)
Ok for verbals just needs to keep upcoming visit

## 2021-10-30 NOTE — Telephone Encounter (Signed)
Verbal orders given  

## 2021-11-01 ENCOUNTER — Ambulatory Visit (INDEPENDENT_AMBULATORY_CARE_PROVIDER_SITE_OTHER): Payer: Medicare Other | Admitting: Internal Medicine

## 2021-11-01 ENCOUNTER — Telehealth: Payer: Self-pay | Admitting: Internal Medicine

## 2021-11-01 ENCOUNTER — Encounter: Payer: Self-pay | Admitting: Internal Medicine

## 2021-11-01 VITALS — BP 126/80 | HR 88 | Resp 18 | Ht 67.0 in | Wt 153.4 lb

## 2021-11-01 DIAGNOSIS — I10 Essential (primary) hypertension: Secondary | ICD-10-CM | POA: Diagnosis not present

## 2021-11-01 DIAGNOSIS — K922 Gastrointestinal hemorrhage, unspecified: Secondary | ICD-10-CM

## 2021-11-01 DIAGNOSIS — K219 Gastro-esophageal reflux disease without esophagitis: Secondary | ICD-10-CM | POA: Diagnosis not present

## 2021-11-01 LAB — CBC
HCT: 32.6 % — ABNORMAL LOW (ref 36.0–46.0)
Hemoglobin: 10.7 g/dL — ABNORMAL LOW (ref 12.0–15.0)
MCHC: 32.7 g/dL (ref 30.0–36.0)
MCV: 84.8 fl (ref 78.0–100.0)
Platelets: 414 10*3/uL — ABNORMAL HIGH (ref 150.0–400.0)
RBC: 3.85 Mil/uL — ABNORMAL LOW (ref 3.87–5.11)
RDW: 15.3 % (ref 11.5–15.5)
WBC: 5.6 10*3/uL (ref 4.0–10.5)

## 2021-11-01 MED ORDER — SENNA-DOCUSATE SODIUM 8.6-50 MG PO TABS: 1.0000 | ORAL_TABLET | Freq: Every day | ORAL | 3 refills | Status: DC

## 2021-11-01 NOTE — Assessment & Plan Note (Signed)
Continue protonix 40 mg daily lifelong due to recurrent bleeding.

## 2021-11-01 NOTE — Patient Instructions (Signed)
We have sent in senokot to take 1 pill daily to help go to the bathroom. It is okay to increase if needed to maximum of 2 pills twice a day.

## 2021-11-01 NOTE — Telephone Encounter (Signed)
Called granddaughter back and let her know ok to hold aspirin until her follow up visit on 6/15. Granddaughter verbalizes understanding.

## 2021-11-01 NOTE — Assessment & Plan Note (Signed)
Checking CBC today for stability. Still having some dark stools but not frequent. Rx senokot-d for hard stools. Advised that less constipation will reduce risk for repeat GI bleeding. Advised to stay off aspirin indefinitely due to her recurrent GI bleeding (3+ episodes in the last 10 years).

## 2021-11-01 NOTE — Addendum Note (Signed)
Addended by: Pricilla Holm A on: 11/01/2021 02:53 PM   Modules accepted: Orders

## 2021-11-01 NOTE — Progress Notes (Signed)
   Subjective:   Patient ID: [name], female    DOB: 1921/07/24, 88 y.o.   MRN: [id number]  HPI The patient is a 88 YO female coming in for post hospital follow up (admitted for GI bleeding likely diverticular, observed overnight with stable Hg). Still having dark stools since being home and irregular bowel movements only 1-2 per week on average and hard. Still black and not taking iron. Is still not taking aspirin.   PMH, [facility]: [name], social history reviewed and updated  Review of Systems  Constitutional: Negative.   HENT: Negative.    Eyes: Negative.   Respiratory:  Negative for cough, chest tightness and shortness of breath.   Cardiovascular:  Negative for chest pain, palpitations and leg swelling.  Gastrointestinal:  Positive for blood in stool and constipation. Negative for abdominal distention, abdominal pain, diarrhea, nausea and vomiting.  Musculoskeletal: Negative.   Skin: Negative.   Neurological: Negative.   Psychiatric/Behavioral: Negative.     Objective:  Physical Exam Constitutional:      Appearance: She is well-developed.  HENT:     Head: Normocephalic and atraumatic.  Cardiovascular:     Rate and Rhythm: Normal rate and regular rhythm.  Pulmonary:     Effort: Pulmonary effort is normal. No respiratory distress.     Breath sounds: Normal breath sounds. No wheezing or rales.  Abdominal:     General: Bowel sounds are normal. There is no distension.     Palpations: Abdomen is soft.     Tenderness: There is no abdominal tenderness. There is no rebound.  Musculoskeletal:     Cervical back: Normal range of motion.  Skin:    General: Skin is warm and dry.  Neurological:     Mental Status: She is alert and oriented to person, place, and time.     Coordination: Coordination abnormal.     Comments: Wheelchair for long distances    Vitals:   11/01/21 1046  BP: 126/80  Pulse: 88  Resp: 18  SpO2: 98%  Weight: 153 lb 6.4 oz (69.6 kg)  Height: '5\' 7"'$  (1.702 m)     Assessment & Plan:

## 2021-11-01 NOTE — Telephone Encounter (Signed)
Patients granddaughter called and wanted to ask a question about patient taking a baby aspirin. Please call back

## 2021-11-01 NOTE — Telephone Encounter (Signed)
Continue to stay off aspirin until follow-up on 6/15.  Thanks.  Dr Lemmie Evens

## 2021-11-01 NOTE — Telephone Encounter (Signed)
Left message for pt to call back  °

## 2021-11-01 NOTE — Assessment & Plan Note (Signed)
BP at goal on amlodipine 10 mg daily and lisinopril 40 mg daily. Labs at goal in hospital. No labs today and keep current medications.

## 2021-11-01 NOTE — Telephone Encounter (Signed)
Spoke with pt's granddaughter regarding her aspirin and recent GI bleed. Pt was recently in the hospital for rectal bleeding and was told at discharge to not take aspirin until repeat CBC done and follow up appointment with cardiology. Pt has an appointment to see Sande Rives, PA on 11/15/21 and granddaughter wanted to make sure that she was ok to not take aspirin until this appointment to further discuss. Will forward to provider to advise. Repeat CBC was collected this morning at pt's PCP appointment but has not resulted yet. Granddaughter verbalizes understanding.

## 2021-11-05 ENCOUNTER — Telehealth: Payer: Self-pay

## 2021-11-05 DIAGNOSIS — N1831 Chronic kidney disease, stage 3a: Secondary | ICD-10-CM | POA: Diagnosis not present

## 2021-11-05 DIAGNOSIS — I129 Hypertensive chronic kidney disease with stage 1 through stage 4 chronic kidney disease, or unspecified chronic kidney disease: Secondary | ICD-10-CM | POA: Diagnosis not present

## 2021-11-05 DIAGNOSIS — H409 Unspecified glaucoma: Secondary | ICD-10-CM | POA: Diagnosis not present

## 2021-11-05 DIAGNOSIS — K922 Gastrointestinal hemorrhage, unspecified: Secondary | ICD-10-CM | POA: Diagnosis not present

## 2021-11-05 DIAGNOSIS — I351 Nonrheumatic aortic (valve) insufficiency: Secondary | ICD-10-CM | POA: Diagnosis not present

## 2021-11-05 DIAGNOSIS — E876 Hypokalemia: Secondary | ICD-10-CM | POA: Diagnosis not present

## 2021-11-05 DIAGNOSIS — M1990 Unspecified osteoarthritis, unspecified site: Secondary | ICD-10-CM | POA: Diagnosis not present

## 2021-11-05 DIAGNOSIS — E43 Unspecified severe protein-calorie malnutrition: Secondary | ICD-10-CM | POA: Diagnosis not present

## 2021-11-05 DIAGNOSIS — K219 Gastro-esophageal reflux disease without esophagitis: Secondary | ICD-10-CM | POA: Diagnosis not present

## 2021-11-05 DIAGNOSIS — I471 Supraventricular tachycardia: Secondary | ICD-10-CM | POA: Diagnosis not present

## 2021-11-05 DIAGNOSIS — Z853 Personal history of malignant neoplasm of breast: Secondary | ICD-10-CM | POA: Diagnosis not present

## 2021-11-05 NOTE — Telephone Encounter (Signed)
[name] is calling to get VO for nursing.   1 times a week for 4 weeks effective  11/05/21.  Please advise

## 2021-11-06 NOTE — Progress Notes (Signed)
Cardiology Office Note:    Date:  11/15/2021   ID:  KORAL Barbara, DOB Jul 16, 1921, MRN [id number]  PCP:  Hoyt Koch, MD  Cardiologist:  Pixie Casino, MD  Electrophysiologist:  None   Referring MD: Hoyt Koch, *   Chief Complaint: follow-up of SVT  History of Present Illness:    [name] is a 88 y.o. female with a history of SVT, mild to moderate aortic insufficiency, hypertension, recent GI bleed likely secondary to diverticulosis, and breast cancer s/p mastectomy in 2012 who is followed by Dr. Debara Pickett and presents today for routine follow-up.  Patient was first seen by Dr. Debara Pickett in 2015 for SVT which responded to Adenosine. She was placed on a beta-blocker for rate control following this. Echo in 2016 showed LVEF of 65-70% with normal wall motion and grade 1 diastolic dysfunction as well as mild to moderate AI. She was last seen by Dr. Debara Pickett in 06/2020 at which time she was doing well from a cardiac standpoint. EKG in the office did show SVT with rates in the 120s after she got up to be weighed in the office and then returned to wheelchair. However, she was asymptomatic with this and had return of sinus rhythm. Her Lopressor was increased.  Since last visit, she was recently admitted from 10/23/2021 to 10/24/2021 for rectal bleeding. Hemoccult was positive. Hemoglobin stable at 12. She was also noted to be tachycardic on arrival with rates in the 120s. She was given IV fluids and rates improved. GI was consulted and recommended conservative management. Patient was instructed to hold Aspirin for about 1 week and then could resume if hemoglobin stable.  Patient presents today for follow-up. Here with granddaughter. Patient is doing well since last visit. She denies any recurrent rectal bleeding. Hemoglobin was stable on labs from PCP's office on 11/01/2021; however, Aspirin was not restarted. I agree with this given advanced age. She denies any chest pain. She  describes a history of dyspnea with exertion but this is not new and is stable. No shortness of breath at rest. No orthopnea, or PND. She has started to have some mild lower extremity swelling. She just started wearing compressions stockings and this has helped a lot. No palpitations, lightheadedness, dizziness, or syncope.  Past Medical History:  Diagnosis Date   Arthritis    Cancer ([place]) 2011, 2012   left breast cancer 2011 central excision,2012 mastectomy   Complication of anesthesia    shortness of breath after procedure   Glaucoma    Hypertension    Protein-calorie malnutrition, severe ([place])    SVT (supraventricular tachycardia) ([place])    Upper GI bleed 01/2013.    Melena. HPylori negative gastritis on EGD.     Past Surgical History:  Procedure Laterality Date   ABDOMINAL HYSTERECTOMY     BREAST SURGERY  2011   BREAST SURGERY  2012   lt breast masty   CHOLECYSTECTOMY     ESOPHAGOGASTRODUODENOSCOPY N/A 01/25/2013   Procedure: ESOPHAGOGASTRODUODENOSCOPY (EGD);  Surgeon: Jerene Bears, MD;  Location: [place];  Service: Gastroenterology;  Laterality: N/A;   ESOPHAGOGASTRODUODENOSCOPY (EGD) WITH PROPOFOL  08/04/2014   Procedure: ESOPHAGOGASTRODUODENOSCOPY (EGD) WITH PROPOFOL;  Surgeon: Inda Castle, MD;  Location: [facility] ENDOSCOPY;  Service: Endoscopy;;    Current Medications: Current Meds  Medication Sig   acetaminophen (TYLENOL) 500 MG tablet Take 2 tablets (1,000 mg total) by mouth every 6 (six) hours as needed for moderate pain (for pain).   amLODipine (NORVASC)  10 MG tablet Take 1 tablet (10 mg total) by mouth daily.   diclofenac sodium (VOLTAREN) 1 % GEL Apply 2 g topically See admin instructions. Apply 2 grams to both knees for pain four times a day (Patient taking differently: Apply 2 g topically 2 (two) times daily as needed (knee pain).)   latanoprost (XALATAN) 0.005 % ophthalmic solution Place 1 drop into both eyes at bedtime.   lisinopril (ZESTRIL) 40 MG tablet TAKE 1  TABLET(40 MG) BY MOUTH DAILY (Patient taking differently: Take 40 mg by mouth daily.)   metoprolol tartrate (LOPRESSOR) 50 MG tablet Take 1 tablet (50 mg total) by mouth 2 (two) times daily.   pantoprazole (PROTONIX) 40 MG tablet Take 1 tablet (40 mg total) by mouth daily.   sennosides-docusate sodium (SENOKOT-S) 8.6-50 MG tablet Take 1 tablet by mouth daily.   triamcinolone cream (KENALOG) 0.1 % Apply 1 application topically 2 (two) times daily. (Patient taking differently: Apply 1 application  topically 2 (two) times daily as needed (rash).)     Allergies:   Codeine   Social History   Socioeconomic History   Marital status: Widowed    Spouse name: Not on file   Number of children: 7   Years of education: 8   Highest education level: Not on file  Occupational History    Employer: RETIRED  Tobacco Use   Smoking status: Never   Smokeless tobacco: Never  Vaping Use   Vaping Use: Never used  Substance and Sexual Activity   Alcohol use: No   Drug use: No   Sexual activity: Never  Other Topics Concern   Not on file  Social History Narrative   8th grade. Married '40 - 52 yrs/widowed. 5 sons, 2 daughters; 6 grandchildren; 12 great grands.   Lives alone and manages ADLs except driving. Family is very supportive. ACP -discussed with her and referred to "[employer]. Org." (Oct '13)   Social Determinants of Health   Financial Resource Strain: Low Risk  (08/18/2017)   Overall Financial Resource Strain (CARDIA)    Difficulty of Paying Living Expenses: Not hard at all  Food Insecurity: No Food Insecurity (08/18/2017)   Hunger Vital Sign    Worried About Running Out of Food in the Last Year: Never true    Ran Out of Food in the Last Year: Never true  Transportation Needs: No Transportation Needs (08/18/2017)   PRAPARE - Hydrologist (Medical): No    Lack of Transportation (Non-Medical): No  Physical Activity: Inactive (08/18/2017)   Exercise  Vital Sign    Days of Exercise per Week: 0 days    Minutes of Exercise per Session: 0 min  Stress: No Stress Concern Present (08/18/2017)   [place]    Feeling of Stress : Only a little  Social Connections: Moderately Integrated (08/18/2017)   Social Connection and Isolation Panel [NHANES]    Frequency of Communication with Friends and Family: More than three times a week    Frequency of Social Gatherings with Friends and Family: More than three times a week    Attends Religious Services: More than 4 times per year    Active Member of [employer] or Organizations: Yes    Attends Archivist Meetings: More than 4 times per year    Marital Status: Widowed     Family History: The patient's family history includes Coronary artery disease (age of onset: 63) in her  mother.  ROS:   Please see the history of present illness.     EKGs/Labs/Other Studies Reviewed:    The following studies were reviewed today:  Echocardiogram 08/04/2014: Study Conclusions: - Left ventricle: The cavity size was normal. There was mild    concentric hypertrophy. Systolic function was vigorous. The    estimated ejection fraction was in the range of 65% to 70%. Wall    motion was normal; there were no regional wall motion    abnormalities. Doppler parameters are consistent with abnormal    left ventricular relaxation (grade 1 diastolic dysfunction).  - Aortic valve: A bicuspid morphology cannot be excluded. There was    mild to moderate regurgitation directed centrally in the LVOT.  - Atrial septum: No defect or patent foramen ovale was identified.  - Tricuspid valve: Mild, late systolicprolapse.   EKG:  EKG ordered today. EKG personally reviewed and demonstrates normal sinus rhythm, rate 71 bpm, with 1st degree AV block (PR interval 286 ms) but no acute ST/T changes. Left axis deviation. QTc 441 ms.  Recent Labs: 10/23/2021: ALT 8; Magnesium  2.1 10/24/2021: BUN 16; Creatinine, Ser 0.80; Potassium 3.7; Sodium 140 11/01/2021: Hemoglobin 10.7; Platelets 414.0  Recent Lipid Panel    Component Value Date/Time   CHOL 212 (H) 08/20/2021 1121   TRIG 82.0 08/20/2021 1121   HDL 59.50 08/20/2021 1121   CHOLHDL 4 08/20/2021 1121   VLDL 16.4 08/20/2021 1121   LDLCALC 136 (H) 08/20/2021 1121    Physical Exam:    Vital Signs: BP 136/72   Pulse 72   Ht '5\' 7"'$  (1.702 m)   Wt 149 lb (67.6 kg)   SpO2 98%   BMI 23.34 kg/m     Wt Readings from Last 3 Encounters:  11/15/21 149 lb (67.6 kg)  11/01/21 153 lb 6.4 oz (69.6 kg)  08/20/21 149 lb 9.6 oz (67.9 kg)     General: 88 y.o. African-American female in no acute distress. HEENT: Normocephalic and atraumatic. Sclera clear.  Neck: Supple. No JVD. Heart: RRR. Distinct S1 and S2. No murmurs, gallops, or rubs.  Lungs: No increased work of breathing. Clear to ausculation bilaterally. No wheezes, rhonchi, or rales.  Abdomen: Soft, non-distended, and non-tender to palpation. Extremities: Trace lower extremity edema bilaterally.   Skin: Warm and dry. Neuro: Alert and oriented x3. No focal deficits. Psych: Normal affect. Responds appropriately.  Assessment:    1. Paroxysmal SVT (supraventricular tachycardia) ([place])   2. Primary hypertension   3. Aortic valve insufficiency, etiology of cardiac valve disease unspecified   4. Recent GI bleed     Plan:    Paroxysmal SVT Maintaining sinus rhythm today.  - No significant palpitations.  - Continue Lopressor '50mg'$  twice daily.  Hypertension BP well controlled.  - Continue current medications: Amlodipine '10mg'$  daily, Lisinopril '40mg'$  daily, and Lopressor '50mg'$  twice daily.  Mild to Moderate Aortic Insufficiency Noted on Echo in 2016.  - Given patient's advanced age and the fact she is asymptomatic, will not repeat Echo. Patient and granddaughter in agreement with this.  Recent GI Bleed Patient was recently admitted with rectal bleeding  felt to possibly be due to diverticulosis. She was seen by GI and treated conservatively.  - No recurrent bleeding.   Disposition: Follow up in 1 year or sooner if needed.    Medication Adjustments/Labs and Tests Ordered: Current medicines are reviewed at length with the patient today.  Concerns regarding medicines are outlined above.  Orders Placed This Encounter  Procedures  EKG 12-Lead   No orders of the defined types were placed in this encounter.   Patient Instructions  Medication Instructions:  No Changes *If you need a refill on your cardiac medications before your next appointment, please call your pharmacy*   Lab Work: No Labs If you have labs (blood work) drawn today and your tests are completely normal, you will receive your results only by: [place] (if you have MyChart) OR A paper copy in the mail If you have any lab test that is abnormal or we need to change your treatment, we will call you to review the results.   Testing/Procedures: No Testing   Follow-Up: At [facility], you and your health needs are our priority.  As part of our continuing mission to provide you with exceptional heart care, we have created designated Provider Care Teams.  These Care Teams include your primary Cardiologist (physician) and Advanced Practice Providers (APPs -  Physician Assistants and Nurse Practitioners) who all work together to provide you with the care you need, when you need it.  We recommend signing up for the patient portal called "MyChart".  Sign up information is provided on this After Visit Summary.  MyChart is used to connect with patients for Virtual Visits (Telemedicine).  Patients are able to view lab/test results, encounter notes, upcoming appointments, etc.  Non-urgent messages can be sent to your provider as well.   To learn more about what you can do with MyChart, go to [email or web address].    Your next appointment:   Keep Scheduled  Appointment  The format for your next appointment:   In Person  Provider:   Pixie Casino, MD       Important Information About Sugar         Signed, Darreld Mclean, PA-C  11/15/2021 11:14 PM    [facility]

## 2021-11-07 DIAGNOSIS — I471 Supraventricular tachycardia: Secondary | ICD-10-CM | POA: Diagnosis not present

## 2021-11-07 DIAGNOSIS — K922 Gastrointestinal hemorrhage, unspecified: Secondary | ICD-10-CM | POA: Diagnosis not present

## 2021-11-07 DIAGNOSIS — N1831 Chronic kidney disease, stage 3a: Secondary | ICD-10-CM | POA: Diagnosis not present

## 2021-11-07 DIAGNOSIS — E876 Hypokalemia: Secondary | ICD-10-CM | POA: Diagnosis not present

## 2021-11-07 DIAGNOSIS — I129 Hypertensive chronic kidney disease with stage 1 through stage 4 chronic kidney disease, or unspecified chronic kidney disease: Secondary | ICD-10-CM | POA: Diagnosis not present

## 2021-11-07 DIAGNOSIS — K219 Gastro-esophageal reflux disease without esophagitis: Secondary | ICD-10-CM | POA: Diagnosis not present

## 2021-11-09 NOTE — Telephone Encounter (Signed)
Fine for verbals °

## 2021-11-09 NOTE — Telephone Encounter (Signed)
LVM with verbal orders. Office number was provided in case she has additional questions or concerns

## 2021-11-12 DIAGNOSIS — I471 Supraventricular tachycardia: Secondary | ICD-10-CM | POA: Diagnosis not present

## 2021-11-12 DIAGNOSIS — K219 Gastro-esophageal reflux disease without esophagitis: Secondary | ICD-10-CM | POA: Diagnosis not present

## 2021-11-12 DIAGNOSIS — I129 Hypertensive chronic kidney disease with stage 1 through stage 4 chronic kidney disease, or unspecified chronic kidney disease: Secondary | ICD-10-CM | POA: Diagnosis not present

## 2021-11-12 DIAGNOSIS — K922 Gastrointestinal hemorrhage, unspecified: Secondary | ICD-10-CM | POA: Diagnosis not present

## 2021-11-12 DIAGNOSIS — N1831 Chronic kidney disease, stage 3a: Secondary | ICD-10-CM | POA: Diagnosis not present

## 2021-11-12 DIAGNOSIS — E876 Hypokalemia: Secondary | ICD-10-CM | POA: Diagnosis not present

## 2021-11-13 DIAGNOSIS — I471 Supraventricular tachycardia: Secondary | ICD-10-CM | POA: Diagnosis not present

## 2021-11-13 DIAGNOSIS — K922 Gastrointestinal hemorrhage, unspecified: Secondary | ICD-10-CM | POA: Diagnosis not present

## 2021-11-13 DIAGNOSIS — E876 Hypokalemia: Secondary | ICD-10-CM | POA: Diagnosis not present

## 2021-11-13 DIAGNOSIS — I129 Hypertensive chronic kidney disease with stage 1 through stage 4 chronic kidney disease, or unspecified chronic kidney disease: Secondary | ICD-10-CM | POA: Diagnosis not present

## 2021-11-13 DIAGNOSIS — N1831 Chronic kidney disease, stage 3a: Secondary | ICD-10-CM | POA: Diagnosis not present

## 2021-11-13 DIAGNOSIS — K219 Gastro-esophageal reflux disease without esophagitis: Secondary | ICD-10-CM | POA: Diagnosis not present

## 2021-11-15 ENCOUNTER — Ambulatory Visit (INDEPENDENT_AMBULATORY_CARE_PROVIDER_SITE_OTHER): Payer: Medicare Other | Admitting: Student

## 2021-11-15 ENCOUNTER — Encounter: Payer: Self-pay | Admitting: Student

## 2021-11-15 VITALS — BP 136/72 | HR 72 | Ht 67.0 in | Wt 149.0 lb

## 2021-11-15 DIAGNOSIS — Z8719 Personal history of other diseases of the digestive system: Secondary | ICD-10-CM | POA: Diagnosis not present

## 2021-11-15 DIAGNOSIS — I351 Nonrheumatic aortic (valve) insufficiency: Secondary | ICD-10-CM

## 2021-11-15 DIAGNOSIS — E876 Hypokalemia: Secondary | ICD-10-CM | POA: Diagnosis not present

## 2021-11-15 DIAGNOSIS — K922 Gastrointestinal hemorrhage, unspecified: Secondary | ICD-10-CM | POA: Diagnosis not present

## 2021-11-15 DIAGNOSIS — I129 Hypertensive chronic kidney disease with stage 1 through stage 4 chronic kidney disease, or unspecified chronic kidney disease: Secondary | ICD-10-CM | POA: Diagnosis not present

## 2021-11-15 DIAGNOSIS — N1831 Chronic kidney disease, stage 3a: Secondary | ICD-10-CM | POA: Diagnosis not present

## 2021-11-15 DIAGNOSIS — I471 Supraventricular tachycardia: Secondary | ICD-10-CM | POA: Diagnosis not present

## 2021-11-15 DIAGNOSIS — I10 Essential (primary) hypertension: Secondary | ICD-10-CM | POA: Diagnosis not present

## 2021-11-15 DIAGNOSIS — K219 Gastro-esophageal reflux disease without esophagitis: Secondary | ICD-10-CM | POA: Diagnosis not present

## 2021-11-15 NOTE — Patient Instructions (Signed)
Medication Instructions:  No Changes *If you need a refill on your cardiac medications before your next appointment, please call your pharmacy*   Lab Work: No Labs If you have labs (blood work) drawn today and your tests are completely normal, you will receive your results only by: [place] (if you have MyChart) OR A paper copy in the mail If you have any lab test that is abnormal or we need to change your treatment, we will call you to review the results.   Testing/Procedures: No Testing   Follow-Up: At [facility], you and your health needs are our priority.  As part of our continuing mission to provide you with exceptional heart care, we have created designated Provider Care Teams.  These Care Teams include your primary Cardiologist (physician) and Advanced Practice Providers (APPs -  Physician Assistants and Nurse Practitioners) who all work together to provide you with the care you need, when you need it.  We recommend signing up for the patient portal called "MyChart".  Sign up information is provided on this After Visit Summary.  MyChart is used to connect with patients for Virtual Visits (Telemedicine).  Patients are able to view lab/test results, encounter notes, upcoming appointments, etc.  Non-urgent messages can be sent to your provider as well.   To learn more about what you can do with MyChart, go to [email or web address].    Your next appointment:   Keep Scheduled Appointment  The format for your next appointment:   In Person  Provider:   Pixie Casino, MD       Important Information About Sugar

## 2021-11-16 ENCOUNTER — Telehealth: Payer: Self-pay | Admitting: Internal Medicine

## 2021-11-16 DIAGNOSIS — K922 Gastrointestinal hemorrhage, unspecified: Secondary | ICD-10-CM | POA: Diagnosis not present

## 2021-11-16 DIAGNOSIS — I129 Hypertensive chronic kidney disease with stage 1 through stage 4 chronic kidney disease, or unspecified chronic kidney disease: Secondary | ICD-10-CM | POA: Diagnosis not present

## 2021-11-16 DIAGNOSIS — E876 Hypokalemia: Secondary | ICD-10-CM | POA: Diagnosis not present

## 2021-11-16 DIAGNOSIS — K219 Gastro-esophageal reflux disease without esophagitis: Secondary | ICD-10-CM | POA: Diagnosis not present

## 2021-11-16 DIAGNOSIS — N1831 Chronic kidney disease, stage 3a: Secondary | ICD-10-CM | POA: Diagnosis not present

## 2021-11-16 DIAGNOSIS — I471 Supraventricular tachycardia: Secondary | ICD-10-CM | POA: Diagnosis not present

## 2021-11-16 NOTE — Telephone Encounter (Signed)
Radovan the OT from [facility] is requesting VO for occupational therapy 1 time a week for 5 weeks starting today.

## 2021-11-19 DIAGNOSIS — E876 Hypokalemia: Secondary | ICD-10-CM | POA: Diagnosis not present

## 2021-11-19 DIAGNOSIS — I129 Hypertensive chronic kidney disease with stage 1 through stage 4 chronic kidney disease, or unspecified chronic kidney disease: Secondary | ICD-10-CM | POA: Diagnosis not present

## 2021-11-19 DIAGNOSIS — I471 Supraventricular tachycardia: Secondary | ICD-10-CM | POA: Diagnosis not present

## 2021-11-19 DIAGNOSIS — N1831 Chronic kidney disease, stage 3a: Secondary | ICD-10-CM | POA: Diagnosis not present

## 2021-11-19 DIAGNOSIS — K922 Gastrointestinal hemorrhage, unspecified: Secondary | ICD-10-CM | POA: Diagnosis not present

## 2021-11-19 DIAGNOSIS — K219 Gastro-esophageal reflux disease without esophagitis: Secondary | ICD-10-CM | POA: Diagnosis not present

## 2021-11-19 NOTE — Telephone Encounter (Signed)
Fine for verbals °

## 2021-11-19 NOTE — Telephone Encounter (Signed)
Called [facility] and left VM on secure VM for verbals ok per provider

## 2021-11-22 DIAGNOSIS — I471 Supraventricular tachycardia: Secondary | ICD-10-CM | POA: Diagnosis not present

## 2021-11-22 DIAGNOSIS — K922 Gastrointestinal hemorrhage, unspecified: Secondary | ICD-10-CM | POA: Diagnosis not present

## 2021-11-22 DIAGNOSIS — K219 Gastro-esophageal reflux disease without esophagitis: Secondary | ICD-10-CM | POA: Diagnosis not present

## 2021-11-22 DIAGNOSIS — I129 Hypertensive chronic kidney disease with stage 1 through stage 4 chronic kidney disease, or unspecified chronic kidney disease: Secondary | ICD-10-CM | POA: Diagnosis not present

## 2021-11-22 DIAGNOSIS — E876 Hypokalemia: Secondary | ICD-10-CM | POA: Diagnosis not present

## 2021-11-22 DIAGNOSIS — N1831 Chronic kidney disease, stage 3a: Secondary | ICD-10-CM | POA: Diagnosis not present

## 2021-11-23 DIAGNOSIS — E876 Hypokalemia: Secondary | ICD-10-CM | POA: Diagnosis not present

## 2021-11-23 DIAGNOSIS — I471 Supraventricular tachycardia: Secondary | ICD-10-CM | POA: Diagnosis not present

## 2021-11-23 DIAGNOSIS — K922 Gastrointestinal hemorrhage, unspecified: Secondary | ICD-10-CM | POA: Diagnosis not present

## 2021-11-23 DIAGNOSIS — I129 Hypertensive chronic kidney disease with stage 1 through stage 4 chronic kidney disease, or unspecified chronic kidney disease: Secondary | ICD-10-CM | POA: Diagnosis not present

## 2021-11-23 DIAGNOSIS — N1831 Chronic kidney disease, stage 3a: Secondary | ICD-10-CM | POA: Diagnosis not present

## 2021-11-23 DIAGNOSIS — K219 Gastro-esophageal reflux disease without esophagitis: Secondary | ICD-10-CM | POA: Diagnosis not present

## 2021-11-26 DIAGNOSIS — N1831 Chronic kidney disease, stage 3a: Secondary | ICD-10-CM | POA: Diagnosis not present

## 2021-11-26 DIAGNOSIS — I129 Hypertensive chronic kidney disease with stage 1 through stage 4 chronic kidney disease, or unspecified chronic kidney disease: Secondary | ICD-10-CM | POA: Diagnosis not present

## 2021-11-26 DIAGNOSIS — K922 Gastrointestinal hemorrhage, unspecified: Secondary | ICD-10-CM | POA: Diagnosis not present

## 2021-11-26 DIAGNOSIS — I471 Supraventricular tachycardia: Secondary | ICD-10-CM | POA: Diagnosis not present

## 2021-11-26 DIAGNOSIS — E876 Hypokalemia: Secondary | ICD-10-CM | POA: Diagnosis not present

## 2021-11-26 DIAGNOSIS — K219 Gastro-esophageal reflux disease without esophagitis: Secondary | ICD-10-CM | POA: Diagnosis not present

## 2021-11-29 DIAGNOSIS — I471 Supraventricular tachycardia: Secondary | ICD-10-CM | POA: Diagnosis not present

## 2021-11-29 DIAGNOSIS — E876 Hypokalemia: Secondary | ICD-10-CM | POA: Diagnosis not present

## 2021-11-29 DIAGNOSIS — K219 Gastro-esophageal reflux disease without esophagitis: Secondary | ICD-10-CM | POA: Diagnosis not present

## 2021-11-29 DIAGNOSIS — N1831 Chronic kidney disease, stage 3a: Secondary | ICD-10-CM | POA: Diagnosis not present

## 2021-11-29 DIAGNOSIS — K922 Gastrointestinal hemorrhage, unspecified: Secondary | ICD-10-CM | POA: Diagnosis not present

## 2021-11-29 DIAGNOSIS — I129 Hypertensive chronic kidney disease with stage 1 through stage 4 chronic kidney disease, or unspecified chronic kidney disease: Secondary | ICD-10-CM | POA: Diagnosis not present

## 2021-11-30 DIAGNOSIS — E876 Hypokalemia: Secondary | ICD-10-CM | POA: Diagnosis not present

## 2021-11-30 DIAGNOSIS — I471 Supraventricular tachycardia: Secondary | ICD-10-CM | POA: Diagnosis not present

## 2021-11-30 DIAGNOSIS — K219 Gastro-esophageal reflux disease without esophagitis: Secondary | ICD-10-CM | POA: Diagnosis not present

## 2021-11-30 DIAGNOSIS — I129 Hypertensive chronic kidney disease with stage 1 through stage 4 chronic kidney disease, or unspecified chronic kidney disease: Secondary | ICD-10-CM | POA: Diagnosis not present

## 2021-11-30 DIAGNOSIS — N1831 Chronic kidney disease, stage 3a: Secondary | ICD-10-CM | POA: Diagnosis not present

## 2021-11-30 DIAGNOSIS — K922 Gastrointestinal hemorrhage, unspecified: Secondary | ICD-10-CM | POA: Diagnosis not present

## 2021-12-03 DIAGNOSIS — E876 Hypokalemia: Secondary | ICD-10-CM | POA: Diagnosis not present

## 2021-12-03 DIAGNOSIS — K922 Gastrointestinal hemorrhage, unspecified: Secondary | ICD-10-CM | POA: Diagnosis not present

## 2021-12-03 DIAGNOSIS — I471 Supraventricular tachycardia: Secondary | ICD-10-CM | POA: Diagnosis not present

## 2021-12-03 DIAGNOSIS — I129 Hypertensive chronic kidney disease with stage 1 through stage 4 chronic kidney disease, or unspecified chronic kidney disease: Secondary | ICD-10-CM | POA: Diagnosis not present

## 2021-12-03 DIAGNOSIS — K219 Gastro-esophageal reflux disease without esophagitis: Secondary | ICD-10-CM | POA: Diagnosis not present

## 2021-12-03 DIAGNOSIS — N1831 Chronic kidney disease, stage 3a: Secondary | ICD-10-CM | POA: Diagnosis not present

## 2021-12-05 DIAGNOSIS — E43 Unspecified severe protein-calorie malnutrition: Secondary | ICD-10-CM | POA: Diagnosis not present

## 2021-12-05 DIAGNOSIS — I471 Supraventricular tachycardia: Secondary | ICD-10-CM | POA: Diagnosis not present

## 2021-12-05 DIAGNOSIS — I129 Hypertensive chronic kidney disease with stage 1 through stage 4 chronic kidney disease, or unspecified chronic kidney disease: Secondary | ICD-10-CM | POA: Diagnosis not present

## 2021-12-05 DIAGNOSIS — M1990 Unspecified osteoarthritis, unspecified site: Secondary | ICD-10-CM | POA: Diagnosis not present

## 2021-12-05 DIAGNOSIS — N1831 Chronic kidney disease, stage 3a: Secondary | ICD-10-CM | POA: Diagnosis not present

## 2021-12-05 DIAGNOSIS — K219 Gastro-esophageal reflux disease without esophagitis: Secondary | ICD-10-CM | POA: Diagnosis not present

## 2021-12-05 DIAGNOSIS — E876 Hypokalemia: Secondary | ICD-10-CM | POA: Diagnosis not present

## 2021-12-05 DIAGNOSIS — H409 Unspecified glaucoma: Secondary | ICD-10-CM | POA: Diagnosis not present

## 2021-12-05 DIAGNOSIS — K922 Gastrointestinal hemorrhage, unspecified: Secondary | ICD-10-CM | POA: Diagnosis not present

## 2021-12-05 DIAGNOSIS — Z853 Personal history of malignant neoplasm of breast: Secondary | ICD-10-CM | POA: Diagnosis not present

## 2021-12-05 DIAGNOSIS — I351 Nonrheumatic aortic (valve) insufficiency: Secondary | ICD-10-CM | POA: Diagnosis not present

## 2021-12-06 DIAGNOSIS — K922 Gastrointestinal hemorrhage, unspecified: Secondary | ICD-10-CM | POA: Diagnosis not present

## 2021-12-06 DIAGNOSIS — N1831 Chronic kidney disease, stage 3a: Secondary | ICD-10-CM | POA: Diagnosis not present

## 2021-12-06 DIAGNOSIS — I129 Hypertensive chronic kidney disease with stage 1 through stage 4 chronic kidney disease, or unspecified chronic kidney disease: Secondary | ICD-10-CM | POA: Diagnosis not present

## 2021-12-06 DIAGNOSIS — E876 Hypokalemia: Secondary | ICD-10-CM | POA: Diagnosis not present

## 2021-12-06 DIAGNOSIS — I471 Supraventricular tachycardia: Secondary | ICD-10-CM | POA: Diagnosis not present

## 2021-12-06 DIAGNOSIS — K219 Gastro-esophageal reflux disease without esophagitis: Secondary | ICD-10-CM | POA: Diagnosis not present

## 2021-12-10 DIAGNOSIS — E876 Hypokalemia: Secondary | ICD-10-CM | POA: Diagnosis not present

## 2021-12-10 DIAGNOSIS — I129 Hypertensive chronic kidney disease with stage 1 through stage 4 chronic kidney disease, or unspecified chronic kidney disease: Secondary | ICD-10-CM | POA: Diagnosis not present

## 2021-12-10 DIAGNOSIS — I471 Supraventricular tachycardia: Secondary | ICD-10-CM | POA: Diagnosis not present

## 2021-12-10 DIAGNOSIS — K922 Gastrointestinal hemorrhage, unspecified: Secondary | ICD-10-CM | POA: Diagnosis not present

## 2021-12-10 DIAGNOSIS — N1831 Chronic kidney disease, stage 3a: Secondary | ICD-10-CM | POA: Diagnosis not present

## 2021-12-10 DIAGNOSIS — K219 Gastro-esophageal reflux disease without esophagitis: Secondary | ICD-10-CM | POA: Diagnosis not present

## 2021-12-12 DIAGNOSIS — N1831 Chronic kidney disease, stage 3a: Secondary | ICD-10-CM | POA: Diagnosis not present

## 2021-12-12 DIAGNOSIS — K922 Gastrointestinal hemorrhage, unspecified: Secondary | ICD-10-CM | POA: Diagnosis not present

## 2021-12-12 DIAGNOSIS — I471 Supraventricular tachycardia: Secondary | ICD-10-CM | POA: Diagnosis not present

## 2021-12-12 DIAGNOSIS — K219 Gastro-esophageal reflux disease without esophagitis: Secondary | ICD-10-CM | POA: Diagnosis not present

## 2021-12-12 DIAGNOSIS — E876 Hypokalemia: Secondary | ICD-10-CM | POA: Diagnosis not present

## 2021-12-12 DIAGNOSIS — I129 Hypertensive chronic kidney disease with stage 1 through stage 4 chronic kidney disease, or unspecified chronic kidney disease: Secondary | ICD-10-CM | POA: Diagnosis not present

## 2021-12-12 NOTE — Telephone Encounter (Signed)
Radovan called in to report pt will be discharged from OP, as she has met goals.   States she has one more PT scheduled next week- will discharge as goals were met for that as well.   Callback #- [phone]

## 2021-12-12 NOTE — Telephone Encounter (Signed)
Is there a question here?

## 2021-12-18 DIAGNOSIS — I129 Hypertensive chronic kidney disease with stage 1 through stage 4 chronic kidney disease, or unspecified chronic kidney disease: Secondary | ICD-10-CM | POA: Diagnosis not present

## 2021-12-18 DIAGNOSIS — E876 Hypokalemia: Secondary | ICD-10-CM | POA: Diagnosis not present

## 2021-12-18 DIAGNOSIS — K219 Gastro-esophageal reflux disease without esophagitis: Secondary | ICD-10-CM | POA: Diagnosis not present

## 2021-12-18 DIAGNOSIS — I471 Supraventricular tachycardia: Secondary | ICD-10-CM | POA: Diagnosis not present

## 2021-12-18 DIAGNOSIS — N1831 Chronic kidney disease, stage 3a: Secondary | ICD-10-CM | POA: Diagnosis not present

## 2021-12-18 DIAGNOSIS — K922 Gastrointestinal hemorrhage, unspecified: Secondary | ICD-10-CM | POA: Diagnosis not present

## 2021-12-31 DIAGNOSIS — H401131 Primary open-angle glaucoma, bilateral, mild stage: Secondary | ICD-10-CM | POA: Diagnosis not present

## 2022-04-08 ENCOUNTER — Encounter: Payer: Self-pay | Admitting: Internal Medicine

## 2022-04-08 ENCOUNTER — Ambulatory Visit: Payer: Medicare Other | Attending: Internal Medicine | Admitting: Internal Medicine

## 2022-04-08 VITALS — BP 140/78 | HR 67 | Ht 67.0 in | Wt 147.8 lb

## 2022-04-08 DIAGNOSIS — I351 Nonrheumatic aortic (valve) insufficiency: Secondary | ICD-10-CM | POA: Diagnosis not present

## 2022-04-08 DIAGNOSIS — I10 Essential (primary) hypertension: Secondary | ICD-10-CM | POA: Diagnosis not present

## 2022-04-08 DIAGNOSIS — I471 Supraventricular tachycardia, unspecified: Secondary | ICD-10-CM

## 2022-04-08 NOTE — Progress Notes (Signed)
OFFICE NOTE  Chief Complaint:  Follow-up  Primary Care Physician: Hoyt Koch, MD  HPI:  [name] is a 88 y.o. female with a past medial history significant for SVT which improved with adenosine, hypertension, malnutrition and a history of upper GI bleeding.  I last saw her in 2015 recently she saw Almyra Deforest, PA-C, for tachycardia.  He felt that she might of actually had atrial fibrillation, but I disagree with this.  We discussed the possibility of anticoagulation however given her age and the fact that she does have some falls, she would not be a good candidate for anticoagulation.  Today she reports being asymptomatic.  Her EKG shows sinus rhythm.  She denies any recurrent significant palpitations.  Blood pressure is elevated however her son reports it has been elevated for years and difficult to control.  In the past she been on additional medicine but apparently her primary care provider discontinued 1 of the medicines because she felt unwell with it.  Denies any chest pain or worsening shortness of breath.  07/27/2018  [name] seen today in follow-up.  Overall she is doing well.  Several months ago she was in the ER for chest discomfort which was felt to be atypical although has not had any significant recurrent SVT.  She denies any worsening shortness of breath.  She remains fairly independent and is able to do most of her ADLs.  Blood pressure appears to be well controlled.  She does have a history of mild to moderate AI but no worsening shortness of breath related to that.  05/17/2019  [name] is seen today in follow-up and is accompanied by her son.  She just had a recent birthday and turned 89.  She denies any chest pain or worsening shortness of breath.  Although her heart rate was increased today in a sinus tachycardia, she had just done a lot of walking and maneuvering and after that her heart rate did improve on auscultation.  Blood pressure was top  normal today.  She did take her medicines.  He denies any SVT.  07/03/2020  [name] seen today for follow-up.  Her son is with her again.  She is now 23.  She has no complaints.  He says that she denies any chest pain or shortness of breath.  Interestingly when she had gotten up to be weighed today and back in her wheelchair an EKG showed an SVT.  Clearly this is not a sinus tachycardia with lack of P waves but subsequently she had normal sinus rhythm.  This is consistent with her known SVTs however she seems to be asymptomatic with it.  04/08/2022  [name] returns today for follow-up.  She is now 62 but seems to be doing reasonably well.  She denies any tachycardia or palpitations.  She has a history of mild to moderate aortic insufficiency by echo back in 2016 however given her age she is not really a candidate for surgery therefore we have not followed it by serial echos.  PMHx:  Past Medical History:  Diagnosis Date   Arthritis    Cancer ([place]) 2011, 2012   left breast cancer 2011 central excision,2012 mastectomy   Complication of anesthesia    shortness of breath after procedure   Glaucoma    Hypertension    Protein-calorie malnutrition, severe ([place])    SVT (supraventricular tachycardia)    Upper GI bleed 01/2013.    Melena. HPylori negative gastritis on EGD.  Past Surgical History:  Procedure Laterality Date   ABDOMINAL HYSTERECTOMY     BREAST SURGERY  2011   BREAST SURGERY  2012   lt breast masty   CHOLECYSTECTOMY     ESOPHAGOGASTRODUODENOSCOPY N/A 01/25/2013   Procedure: ESOPHAGOGASTRODUODENOSCOPY (EGD);  Surgeon: Jerene Bears, MD;  Location: [place];  Service: Gastroenterology;  Laterality: N/A;   ESOPHAGOGASTRODUODENOSCOPY (EGD) WITH PROPOFOL  08/04/2014   Procedure: ESOPHAGOGASTRODUODENOSCOPY (EGD) WITH PROPOFOL;  Surgeon: Inda Castle, MD;  Location: [facility] ENDOSCOPY;  Service: Endoscopy;;    FAMHx:  Family History  Problem Relation Age of Onset    Coronary artery disease Mother 39       MI    SOCHx:   reports that she has never smoked. She has never used smokeless tobacco. She reports that she does not drink alcohol and does not use drugs.  ALLERGIES:  Allergies  Allergen Reactions   Codeine Nausea And Vomiting    ROS: Pertinent items noted in HPI and remainder of comprehensive ROS otherwise negative.  HOME MEDS: Current Outpatient Medications on File Prior to Visit  Medication Sig Dispense Refill   acetaminophen (TYLENOL) 500 MG tablet Take 2 tablets (1,000 mg total) by mouth every 6 (six) hours as needed for moderate pain (for pain). 30 tablet 0   amLODipine (NORVASC) 10 MG tablet Take 1 tablet (10 mg total) by mouth daily. 90 tablet 3   diclofenac sodium (VOLTAREN) 1 % GEL Apply 2 g topically See admin instructions. Apply 2 grams to both knees for pain four times a day (Patient taking differently: Apply 2 g topically 2 (two) times daily as needed (knee pain).)     latanoprost (XALATAN) 0.005 % ophthalmic solution Place 1 drop into both eyes at bedtime.     lisinopril (ZESTRIL) 40 MG tablet TAKE 1 TABLET(40 MG) BY MOUTH DAILY (Patient taking differently: Take 40 mg by mouth daily.) 90 tablet 3   metoprolol tartrate (LOPRESSOR) 50 MG tablet Take 1 tablet (50 mg total) by mouth 2 (two) times daily. 180 tablet 3   pantoprazole (PROTONIX) 40 MG tablet Take 1 tablet (40 mg total) by mouth daily. 90 tablet 3   sennosides-docusate sodium (SENOKOT-S) 8.6-50 MG tablet Take 1 tablet by mouth daily. 90 tablet 3   triamcinolone cream (KENALOG) 0.1 % Apply 1 application topically 2 (two) times daily. (Patient taking differently: Apply 1 application  topically 2 (two) times daily as needed (rash).) 100 g 0   No current facility-administered medications on file prior to visit.    LABS/IMAGING: No results found for this or any previous visit (from the past 48 hour(s)). No results found.  LIPID PANEL:    Component Value Date/Time    CHOL 212 (H) 08/20/2021 1121   TRIG 82.0 08/20/2021 1121   HDL 59.50 08/20/2021 1121   CHOLHDL 4 08/20/2021 1121   VLDL 16.4 08/20/2021 1121   LDLCALC 136 (H) 08/20/2021 1121     WEIGHTS: Wt Readings from Last 3 Encounters:  04/08/22 147 lb 12.8 oz (67 kg)  11/15/21 149 lb (67.6 kg)  11/01/21 153 lb 6.4 oz (69.6 kg)    VITALS: BP (!) 140/78   Pulse 67   Ht '5\' 7"'$  (1.702 m)   Wt 147 lb 12.8 oz (67 kg)   SpO2 98%   BMI 23.15 kg/m   EXAM: General appearance: alert and no distress Neck: no carotid bruit, no JVD and thyroid not enlarged, symmetric, no tenderness/mass/nodules Lungs: clear to auscultation bilaterally Heart: regular rate  and rhythm, S1, S2 normal, no murmur, click, rub or gallop Abdomen: soft, non-tender; bowel sounds normal; no masses,  no organomegaly Extremities: extremities normal, atraumatic, no cyanosis or edema Pulses: 2+ and symmetric Skin: Skin color, texture, turgor normal. No rashes or lesions Neurologic: Grossly normal Psych: Pleasant  EKG:  Deferred  ASSESSMENT: History of PSVT-noted to be present today Hypertension History of mild to moderate aortic insufficiency (2016)  PLAN: 1.   [name] denies any recurrent palpitations.  Blood pressure was a little elevated today but is not on reasonably controlled.  She is on 3 medications for this.  She did have some mild to moderate aortic insufficiency back in 2016 however we do not follow this with echo because she is not a candidate for valve surgery given her advanced age.  No changes to her medicines today.  Follow-up annually or sooner as necessary.  Pixie Casino, MD, [facility], [place] Director of the Advanced Lipid Disorders &  Cardiovascular Risk Reduction Clinic Diplomate of the American Board of Clinical Lipidology Attending Cardiologist  Direct Dial: [phone]  Fax: [phone]  Website:  [email or web address] 04/08/2022, 9:39  AM

## 2022-04-08 NOTE — Patient Instructions (Signed)
Medication Instructions:  Your physician recommends that you continue on your current medications as directed. Please refer to the Current Medication list given to you today.  *If you need a refill on your cardiac medications before your next appointment, please call your pharmacy*  Follow-Up: At [facility], you and your health needs are our priority.  As part of our continuing mission to provide you with exceptional heart care, we have created designated Provider Care Teams.  These Care Teams include your primary Cardiologist (physician) and Advanced Practice Providers (APPs -  Physician Assistants and Nurse Practitioners) who all work together to provide you with the care you need, when you need it.  We recommend signing up for the patient portal called "MyChart".  Sign up information is provided on this After Visit Summary.  MyChart is used to connect with patients for Virtual Visits (Telemedicine).  Patients are able to view lab/test results, encounter notes, upcoming appointments, etc.  Non-urgent messages can be sent to your provider as well.   To learn more about what you can do with MyChart, go to [email or web address].    Your next appointment:   12 month(s)  The format for your next appointment:   In Person  Provider:   Pixie Casino, MD

## 2022-05-16 ENCOUNTER — Other Ambulatory Visit: Payer: Self-pay | Admitting: Internal Medicine

## 2022-08-04 ENCOUNTER — Other Ambulatory Visit: Payer: Self-pay | Admitting: Internal Medicine

## 2022-08-05 ENCOUNTER — Other Ambulatory Visit: Payer: Self-pay | Admitting: Internal Medicine

## 2022-08-26 ENCOUNTER — Ambulatory Visit (INDEPENDENT_AMBULATORY_CARE_PROVIDER_SITE_OTHER): Payer: Medicare Other | Admitting: Internal Medicine

## 2022-08-26 ENCOUNTER — Encounter: Payer: Self-pay | Admitting: Internal Medicine

## 2022-08-26 VITALS — BP 138/62 | HR 73 | Temp 97.9°F | Ht 67.0 in | Wt 148.0 lb

## 2022-08-26 DIAGNOSIS — N183 Chronic kidney disease, stage 3 unspecified: Secondary | ICD-10-CM

## 2022-08-26 DIAGNOSIS — I10 Essential (primary) hypertension: Secondary | ICD-10-CM

## 2022-08-26 DIAGNOSIS — Z0000 Encounter for general adult medical examination without abnormal findings: Secondary | ICD-10-CM

## 2022-08-26 DIAGNOSIS — M159 Polyosteoarthritis, unspecified: Secondary | ICD-10-CM | POA: Diagnosis not present

## 2022-08-26 DIAGNOSIS — K219 Gastro-esophageal reflux disease without esophagitis: Secondary | ICD-10-CM

## 2022-08-26 DIAGNOSIS — I471 Supraventricular tachycardia, unspecified: Secondary | ICD-10-CM

## 2022-08-26 LAB — COMPREHENSIVE METABOLIC PANEL
ALT: 7 U/L (ref 0–35)
AST: 17 U/L (ref 0–37)
Albumin: 3.8 g/dL (ref 3.5–5.2)
Alkaline Phosphatase: 96 U/L (ref 39–117)
BUN: 19 mg/dL (ref 6–23)
CO2: 26 mEq/L (ref 19–32)
Calcium: 9.4 mg/dL (ref 8.4–10.5)
Chloride: 106 mEq/L (ref 96–112)
Creatinine, Ser: 0.87 mg/dL (ref 0.40–1.20)
GFR: 55.4 mL/min — ABNORMAL LOW (ref >60.00)
Glucose, Bld: 94 mg/dL (ref 70–99)
Potassium: 3.7 mEq/L (ref 3.5–5.1)
Sodium: 140 mEq/L (ref 135–145)
Total Bilirubin: 0.5 mg/dL (ref 0.2–1.2)
Total Protein: 7.2 g/dL (ref 6.0–8.3)

## 2022-08-26 LAB — LIPID PANEL
Cholesterol: 202 mg/dL — ABNORMAL HIGH (ref 0–200)
HDL: 56 mg/dL (ref >39.00)
LDL Cholesterol: 126 mg/dL — ABNORMAL HIGH (ref 0–99)
NonHDL: 145.8
Total CHOL/HDL Ratio: 4
Triglycerides: 101 mg/dL (ref 0.0–149.0)
VLDL: 20.2 mg/dL (ref 0.0–40.0)

## 2022-08-26 LAB — CBC
HCT: 39.3 % (ref 36.0–46.0)
Hemoglobin: 12.9 g/dL (ref 12.0–15.0)
MCHC: 32.7 g/dL (ref 30.0–36.0)
MCV: 82.4 fl (ref 78.0–100.0)
Platelets: 431 10*3/uL — ABNORMAL HIGH (ref 150.0–400.0)
RBC: 4.77 Mil/uL (ref 3.87–5.11)
RDW: 15.8 % — ABNORMAL HIGH (ref 11.5–15.5)
WBC: 5.5 10*3/uL (ref 4.0–10.5)

## 2022-08-26 MED ORDER — METOPROLOL TARTRATE 50 MG PO TABS: 50.0000 mg | ORAL_TABLET | Freq: Two times a day (BID) | ORAL | 3 refills | Status: DC

## 2022-08-26 MED ORDER — TRIAMCINOLONE ACETONIDE 0.1 % EX CREA: 1.0000 | TOPICAL_CREAM | Freq: Two times a day (BID) | CUTANEOUS | 11 refills | Status: DC

## 2022-08-26 MED ORDER — SENNA-DOCUSATE SODIUM 8.6-50 MG PO TABS: 1.0000 | ORAL_TABLET | Freq: Every day | ORAL | 3 refills | Status: DC

## 2022-08-26 MED ORDER — PANTOPRAZOLE SODIUM 40 MG PO TBEC: DELAYED_RELEASE_TABLET | ORAL | 3 refills | Status: DC

## 2022-08-26 MED ORDER — LISINOPRIL 40 MG PO TABS: 40.0000 mg | ORAL_TABLET | Freq: Every day | ORAL | 3 refills | Status: DC

## 2022-08-26 MED ORDER — CLONAZEPAM 0.5 MG PO TABS: 0.5000 mg | ORAL_TABLET | Freq: Two times a day (BID) | ORAL | 5 refills | Status: DC | PRN

## 2022-08-26 MED ORDER — AMLODIPINE BESYLATE 10 MG PO TABS: 10.0000 mg | ORAL_TABLET | Freq: Every day | ORAL | 3 refills | Status: DC

## 2022-08-26 NOTE — Patient Instructions (Signed)
We have sent in clonazepam to use as needed for any agitation or having a hard time sleeping.

## 2022-08-26 NOTE — Progress Notes (Signed)
Subjective:   Patient ID: [name], female    DOB: 02-Feb-1922, 89 y.o.   MRN: [id number]  HPI Here for medicare wellness, no new complaints. Please see A/P for status and treatment of chronic medical problems.   Diet: heart healthy Physical activity: sedentary Depression/mood screen: negative Hearing: moderate to severe loss Visual acuity: grossly normal with lens, performs annual eye exam  ADLs: capable Fall risk: low Home safety: needs sitter for dementia Cognitive evaluation: intact to orientation, poor short term memory EOL planning: adv directives discussed, not in place  [employer] Visit from 11/01/2021 in [place] [facility] [place] Visit from 11/01/2021 in [place] at [facility]  PHQ-9 Total Score 8         10/23/2021    8:35 AM 10/23/2021    2:00 PM 10/23/2021    8:00 PM 10/24/2021    9:00 AM 11/01/2021   10:49 AM  [place] in the past year?     0  Was there an injury with Fall?     0  Fall Risk Category Calculator     0  Fall Risk Category (Retired)     Low  (RETIRED) Patient Fall Risk Level High fall risk High fall risk High fall risk High fall risk     I have personally reviewed and have noted 1. The patient's medical and social history - reviewed today no changes 2. Their use of alcohol, tobacco or illicit drugs 3. Their current medications and supplements 4. The patient's functional ability including ADL's, fall risks, home safety risks and hearing or visual impairment. 5. Diet and physical activities 6. Evidence for depression or mood disorders 7. Care team reviewed and updated 8.  The patient is not on an opioid pain medication.  Patient Care Team: Hoyt Koch, MD as PCP - General (Internal Medicine) Pixie Casino, MD as PCP - Cardiology (Cardiology) Past Medical History:  Diagnosis Date   Arthritis    Cancer [facility]) 2011,  2012   left breast cancer 2011 central excision,2012 mastectomy   Complication of anesthesia    shortness of breath after procedure   Glaucoma    Hypertension    Protein-calorie malnutrition, severe ([place])    SVT (supraventricular tachycardia)    Upper GI bleed 01/2013.    Melena. HPylori negative gastritis on EGD.    Past Surgical History:  Procedure Laterality Date   ABDOMINAL HYSTERECTOMY     BREAST SURGERY  2011   BREAST SURGERY  2012   lt breast masty   CHOLECYSTECTOMY     ESOPHAGOGASTRODUODENOSCOPY N/A 01/25/2013   Procedure: ESOPHAGOGASTRODUODENOSCOPY (EGD);  Surgeon: Jerene Bears, MD;  Location: [facility];  Service: Gastroenterology;  Laterality: N/A;   ESOPHAGOGASTRODUODENOSCOPY (EGD) WITH PROPOFOL  08/04/2014   Procedure: ESOPHAGOGASTRODUODENOSCOPY (EGD) WITH PROPOFOL;  Surgeon: Inda Castle, MD;  Location: [facility] ENDOSCOPY;  Service: Endoscopy;;   Family History  Problem Relation Age of Onset   Coronary artery disease Mother 15       MI   Review of Systems  Unable to perform ROS: Dementia  Constitutional: Negative.   HENT: Negative.    Eyes: Negative.   Respiratory:  Negative for cough, chest tightness and shortness of breath.   Cardiovascular:  Negative for chest pain, palpitations and leg swelling.  Gastrointestinal:  Negative for abdominal distention, abdominal pain, constipation, diarrhea, nausea  and vomiting.  Musculoskeletal: Negative.   Skin: Negative.   Neurological: Negative.   Psychiatric/Behavioral: Negative.      Objective:  Physical Exam Constitutional:      Appearance: She is well-developed.  HENT:     Head: Normocephalic and atraumatic.  Cardiovascular:     Rate and Rhythm: Normal rate and regular rhythm.  Pulmonary:     Effort: Pulmonary effort is normal. No respiratory distress.     Breath sounds: Normal breath sounds. No wheezing or rales.  Abdominal:     General: Bowel sounds are normal. There is no distension.     Palpations: Abdomen  is soft.     Tenderness: There is no abdominal tenderness. There is no rebound.  Musculoskeletal:     Cervical back: Normal range of motion.  Skin:    General: Skin is warm and dry.  Neurological:     Mental Status: She is alert.     Coordination: Coordination abnormal.     Comments: Walks at home, wheelchair due to long distance in the office     Vitals:   08/26/22 1100 08/26/22 1138  BP: (!) 160/82 138/62  Pulse: 73   Temp: 97.9 F (36.6 C)   TempSrc: Oral   SpO2: 93%   Weight: 148 lb (67.1 kg)   Height: 5\' 7"  (1.702 m)     Assessment & Plan:

## 2022-08-26 NOTE — Assessment & Plan Note (Signed)
Flu shot declines. Pneumonia complete. Shingrix declines. Tetanus declines. Colonoscopy aged out. Mammogram aged out, pap smear aged out and dexa complete. Counseled about sun safety and mole surveillance. Counseled about the dangers of distracted driving. Given 10 year screening recommendations.

## 2022-08-26 NOTE — Assessment & Plan Note (Signed)
Hx GI bleed. No clinical signs of bleeding. Taking protonix 40 mg daily and will continue lifelong. Checking CBC.

## 2022-08-26 NOTE — Assessment & Plan Note (Signed)
Uses tylenol for pain given past GI bleed. No clinical signs of GI bleed. Knee pain especially bad and this limits activity levels.

## 2022-08-26 NOTE — Assessment & Plan Note (Signed)
BP at goal on amlodipine 10 mg daily and lisinopril 40 mg daily and metoprolol 50 mg BID. Checking CBC and CMP and lipid panel. Adjust as needed.

## 2022-08-26 NOTE — Assessment & Plan Note (Signed)
No recent episodes and taking metoprolol 50 mg BID for prevention.

## 2022-08-26 NOTE — Assessment & Plan Note (Signed)
Checking CMP and adjust as needed. BP at goal.  

## 2022-09-09 ENCOUNTER — Telehealth: Payer: Self-pay | Admitting: Internal Medicine

## 2022-09-09 ENCOUNTER — Other Ambulatory Visit: Payer: Self-pay | Admitting: Internal Medicine

## 2022-09-09 NOTE — Telephone Encounter (Signed)
Contacted [name] to schedule their annual wellness visit. Appointment made for 09/12/2022.  [facility] Care Guide [facility] AWV TEAM Direct Dial: [phone]

## 2022-10-29 ENCOUNTER — Other Ambulatory Visit: Payer: Self-pay | Admitting: Internal Medicine

## 2022-11-11 DIAGNOSIS — H401131 Primary open-angle glaucoma, bilateral, mild stage: Secondary | ICD-10-CM | POA: Diagnosis not present

## 2023-03-31 ENCOUNTER — Emergency Department (HOSPITAL_COMMUNITY): Payer: Medicare Other

## 2023-03-31 ENCOUNTER — Inpatient Hospital Stay (HOSPITAL_COMMUNITY)
Admission: EM | Admit: 2023-03-31 | Discharge: 2023-04-04 | DRG: 641 | Disposition: A | Discharge status: Skilled Nursing Facility | Payer: Medicare Other | Attending: Internal Medicine | Admitting: Internal Medicine

## 2023-03-31 ENCOUNTER — Other Ambulatory Visit: Payer: Self-pay

## 2023-03-31 DIAGNOSIS — R5381 Other malaise: Secondary | ICD-10-CM | POA: Diagnosis not present

## 2023-03-31 DIAGNOSIS — I4891 Unspecified atrial fibrillation: Secondary | ICD-10-CM

## 2023-03-31 DIAGNOSIS — N179 Acute kidney failure, unspecified: Secondary | ICD-10-CM | POA: Diagnosis not present

## 2023-03-31 DIAGNOSIS — K219 Gastro-esophageal reflux disease without esophagitis: Secondary | ICD-10-CM | POA: Diagnosis present

## 2023-03-31 DIAGNOSIS — R531 Weakness: Principal | ICD-10-CM

## 2023-03-31 DIAGNOSIS — Z853 Personal history of malignant neoplasm of breast: Secondary | ICD-10-CM

## 2023-03-31 DIAGNOSIS — F05 Delirium due to known physiological condition: Secondary | ICD-10-CM | POA: Diagnosis present

## 2023-03-31 DIAGNOSIS — E559 Vitamin D deficiency, unspecified: Secondary | ICD-10-CM | POA: Diagnosis present

## 2023-03-31 DIAGNOSIS — I480 Paroxysmal atrial fibrillation: Secondary | ICD-10-CM | POA: Diagnosis present

## 2023-03-31 DIAGNOSIS — Z7401 Bed confinement status: Secondary | ICD-10-CM | POA: Diagnosis not present

## 2023-03-31 DIAGNOSIS — Z90710 Acquired absence of both cervix and uterus: Secondary | ICD-10-CM | POA: Diagnosis not present

## 2023-03-31 DIAGNOSIS — R7989 Other specified abnormal findings of blood chemistry: Secondary | ICD-10-CM | POA: Diagnosis present

## 2023-03-31 DIAGNOSIS — Z79899 Other long term (current) drug therapy: Secondary | ICD-10-CM

## 2023-03-31 DIAGNOSIS — R451 Restlessness and agitation: Secondary | ICD-10-CM

## 2023-03-31 DIAGNOSIS — I129 Hypertensive chronic kidney disease with stage 1 through stage 4 chronic kidney disease, or unspecified chronic kidney disease: Secondary | ICD-10-CM | POA: Diagnosis present

## 2023-03-31 DIAGNOSIS — K2100 Gastro-esophageal reflux disease with esophagitis, without bleeding: Secondary | ICD-10-CM | POA: Diagnosis not present

## 2023-03-31 DIAGNOSIS — Z8249 Family history of ischemic heart disease and other diseases of the circulatory system: Secondary | ICD-10-CM

## 2023-03-31 DIAGNOSIS — I700 Atherosclerosis of aorta: Secondary | ICD-10-CM | POA: Diagnosis present

## 2023-03-31 DIAGNOSIS — Z885 Allergy status to narcotic agent status: Secondary | ICD-10-CM | POA: Diagnosis not present

## 2023-03-31 DIAGNOSIS — R4189 Other symptoms and signs involving cognitive functions and awareness: Secondary | ICD-10-CM | POA: Diagnosis present

## 2023-03-31 DIAGNOSIS — I471 Supraventricular tachycardia, unspecified: Secondary | ICD-10-CM | POA: Diagnosis not present

## 2023-03-31 DIAGNOSIS — J189 Pneumonia, unspecified organism: Secondary | ICD-10-CM | POA: Diagnosis not present

## 2023-03-31 DIAGNOSIS — R54 Age-related physical debility: Secondary | ICD-10-CM | POA: Diagnosis present

## 2023-03-31 DIAGNOSIS — I351 Nonrheumatic aortic (valve) insufficiency: Secondary | ICD-10-CM | POA: Diagnosis present

## 2023-03-31 DIAGNOSIS — E860 Dehydration: Principal | ICD-10-CM | POA: Diagnosis present

## 2023-03-31 DIAGNOSIS — Z8719 Personal history of other diseases of the digestive system: Secondary | ICD-10-CM | POA: Diagnosis not present

## 2023-03-31 DIAGNOSIS — Z66 Do not resuscitate: Secondary | ICD-10-CM | POA: Diagnosis present

## 2023-03-31 DIAGNOSIS — I4719 Other supraventricular tachycardia: Secondary | ICD-10-CM | POA: Diagnosis present

## 2023-03-31 DIAGNOSIS — N1831 Chronic kidney disease, stage 3a: Secondary | ICD-10-CM | POA: Diagnosis present

## 2023-03-31 DIAGNOSIS — I672 Cerebral atherosclerosis: Secondary | ICD-10-CM | POA: Diagnosis not present

## 2023-03-31 DIAGNOSIS — I639 Cerebral infarction, unspecified: Secondary | ICD-10-CM | POA: Diagnosis not present

## 2023-03-31 DIAGNOSIS — R197 Diarrhea, unspecified: Secondary | ICD-10-CM | POA: Diagnosis not present

## 2023-03-31 DIAGNOSIS — N183 Chronic kidney disease, stage 3 unspecified: Secondary | ICD-10-CM | POA: Diagnosis not present

## 2023-03-31 DIAGNOSIS — K5641 Fecal impaction: Secondary | ICD-10-CM | POA: Diagnosis not present

## 2023-03-31 DIAGNOSIS — R4182 Altered mental status, unspecified: Secondary | ICD-10-CM | POA: Diagnosis not present

## 2023-03-31 DIAGNOSIS — R41841 Cognitive communication deficit: Secondary | ICD-10-CM | POA: Diagnosis not present

## 2023-03-31 DIAGNOSIS — I517 Cardiomegaly: Secondary | ICD-10-CM | POA: Diagnosis not present

## 2023-03-31 DIAGNOSIS — R2681 Unsteadiness on feet: Secondary | ICD-10-CM | POA: Diagnosis not present

## 2023-03-31 DIAGNOSIS — Z9012 Acquired absence of left breast and nipple: Secondary | ICD-10-CM

## 2023-03-31 DIAGNOSIS — Z9049 Acquired absence of other specified parts of digestive tract: Secondary | ICD-10-CM

## 2023-03-31 DIAGNOSIS — H409 Unspecified glaucoma: Secondary | ICD-10-CM | POA: Diagnosis present

## 2023-03-31 DIAGNOSIS — E538 Deficiency of other specified B group vitamins: Secondary | ICD-10-CM | POA: Diagnosis present

## 2023-03-31 DIAGNOSIS — I10 Essential (primary) hypertension: Secondary | ICD-10-CM | POA: Diagnosis not present

## 2023-03-31 DIAGNOSIS — M6281 Muscle weakness (generalized): Secondary | ICD-10-CM | POA: Diagnosis not present

## 2023-03-31 DIAGNOSIS — R29898 Other symptoms and signs involving the musculoskeletal system: Secondary | ICD-10-CM | POA: Diagnosis not present

## 2023-03-31 LAB — BASIC METABOLIC PANEL
Anion gap: 12 (ref 5–15)
BUN: 14 mg/dL (ref 8–23)
CO2: 20 mmol/L — ABNORMAL LOW (ref 22–32)
Calcium: 9 mg/dL (ref 8.9–10.3)
Chloride: 105 mmol/L (ref 98–111)
Creatinine, Ser: 0.99 mg/dL (ref 0.44–1.00)
GFR, Estimated: 51 mL/min — ABNORMAL LOW (ref >60)
Glucose, Bld: 119 mg/dL — ABNORMAL HIGH (ref 70–99)
Potassium: 4.2 mmol/L (ref 3.5–5.1)
Sodium: 137 mmol/L (ref 135–145)

## 2023-03-31 LAB — URINALYSIS, ROUTINE W REFLEX MICROSCOPIC
Bacteria, UA: NONE SEEN
Bilirubin Urine: NEGATIVE
Glucose, UA: NEGATIVE mg/dL
Ketones, ur: NEGATIVE mg/dL
Leukocytes,Ua: NEGATIVE
Nitrite: NEGATIVE
Protein, ur: NEGATIVE mg/dL
Specific Gravity, Urine: 1.011 (ref 1.005–1.030)
pH: 6 (ref 5.0–8.0)

## 2023-03-31 LAB — CBC
HCT: 40.9 % (ref 36.0–46.0)
Hemoglobin: 12.7 g/dL (ref 12.0–15.0)
MCH: 25.8 pg — ABNORMAL LOW (ref 26.0–34.0)
MCHC: 31.1 g/dL (ref 30.0–36.0)
MCV: 83.1 fL (ref 80.0–100.0)
Platelets: 333 10*3/uL (ref 150–400)
RBC: 4.92 MIL/uL (ref 3.87–5.11)
RDW: 15.9 % — ABNORMAL HIGH (ref 11.5–15.5)
WBC: 9.3 10*3/uL (ref 4.0–10.5)
nRBC: 0 % (ref 0.0–0.2)

## 2023-03-31 LAB — CBG MONITORING, ED: Glucose-Capillary: 126 mg/dL — ABNORMAL HIGH (ref 70–99)

## 2023-03-31 MED ORDER — OLANZAPINE 2.5 MG PO TABS
2.5000 mg | ORAL_TABLET | Freq: Every day | ORAL | Status: DC
  Administered 2023-03-31 – 2023-04-03 (×4): 2.5 mg via ORAL
  Filled 2023-03-31 (×5): qty 1

## 2023-03-31 MED ORDER — SODIUM CHLORIDE 0.9 % IV BOLUS
1000.0000 mL | Freq: Once | INTRAVENOUS | Status: AC
  Administered 2023-03-31: 1000 mL via INTRAVENOUS

## 2023-03-31 MED ORDER — SODIUM CHLORIDE 0.9 % IV BOLUS: 1000.0000 mL | Freq: Once | INTRAVENOUS | Status: DC

## 2023-03-31 NOTE — ED Provider Notes (Signed)
[place] EMERGENCY DEPARTMENT AT [facility] Provider Note   CSN: [id number] Arrival date & time: 03/31/23  1747     History {Add pertinent medical, surgical, social history, OB history to HPI:1} Chief Complaint  Patient presents with   Weakness    [name] is a 89 y.o. female.  With a past medical history of hypertension, GI bleeding and CKD who presents to the ED for generalized weakness.  Patient's son at the bedside notes increased generalized weakness making it hard for the patient to ambulate.  She typically is able to ambulate on her own with a walker.  Son notes she has been taking MiraLAX for constipation but has had multiple episodes of loose stools during the last week.  No evidence of bleeding.  Family also voices concern for confusion.  No recent falls, chest pain, trouble breathing, fevers, chills or recent antibiotic use.   Weakness      Home Medications Prior to Admission medications   Medication Sig Start Date End Date Taking? Authorizing Provider  acetaminophen (TYLENOL) 500 MG tablet Take 2 tablets (1,000 mg total) by mouth every 6 (six) hours as needed for moderate pain (for pain). 01/03/21   [name], PA-C  amLODipine (NORVASC) 10 MG tablet TAKE 1 TABLET(10 MG) BY MOUTH DAILY 10/29/22   [name], MD  clonazePAM (KLONOPIN) 0.5 MG tablet Take 1 tablet (0.5 mg total) by mouth 2 (two) times daily as needed for anxiety. 08/26/22   [name], MD  diclofenac sodium (VOLTAREN) 1 % GEL Apply 2 g topically See admin instructions. Apply 2 grams to both knees for pain four times a day Patient taking differently: Apply 2 g topically 2 (two) times daily as needed (knee pain). 10/31/17   [name], MD  latanoprost (XALATAN) 0.005 % ophthalmic solution Place 1 drop into both eyes at bedtime. 06/17/18   [provider]  lisinopril (ZESTRIL) 40 MG tablet Take 1 tablet (40 mg total) by mouth daily. 09/09/22   [name], MD  metoprolol tartrate (LOPRESSOR) 50 MG tablet Take 1 tablet (50 mg total) by mouth 2 (two) times daily. 08/26/22   [name], MD  pantoprazole (PROTONIX) 40 MG tablet TAKE 1 TABLET(40 MG) BY MOUTH DAILY 08/26/22   [name], MD  sennosides-docusate sodium (SENOKOT-S) 8.6-50 MG tablet Take 1 tablet by mouth daily. 08/26/22   [name], MD  triamcinolone cream (KENALOG) 0.1 % Apply 1 Application topically 2 (two) times daily. 08/26/22   [name], MD      Allergies    Codeine    Review of Systems   Review of Systems  Neurological:  Positive for weakness.    Physical Exam Updated Vital Signs BP 124/69   Pulse 67   Temp 97.6 F (36.4 C) (Oral)   Resp 19   Ht 5\' 7"  (1.702 m)   Wt 68 kg   SpO2 98%   BMI 23.48 kg/m  Physical Exam Vitals and nursing note reviewed.  HENT:     Head: Normocephalic and atraumatic.  Eyes:     Pupils: Pupils are equal, round, and reactive to light.  Cardiovascular:     Rate and Rhythm: Normal rate and regular rhythm.  Pulmonary:     Effort: Pulmonary effort is normal.     Breath sounds: Normal breath sounds.  Abdominal:     Palpations: Abdomen is soft.     Tenderness: There is no abdominal tenderness.  Skin:    General: Skin is warm and dry.  Neurological:     General: No focal deficit present.     Mental Status: She is alert and oriented to person, place, and time. Mental status is at baseline.  Psychiatric:        Mood and Affect: Mood normal.     ED Results / Procedures / Treatments   Labs (all labs ordered are listed, but only abnormal results are displayed) Labs Reviewed  BASIC METABOLIC PANEL - Abnormal; Notable for the following components:      Result Value   CO2 20 (*)    Glucose, Bld 119 (*)    GFR, Estimated 51 (*)    All other components within normal limits  CBC - Abnormal; Notable for the following components:   MCH 25.8 (*)    RDW 15.9 (*)    All other  components within normal limits  CBG MONITORING, ED - Abnormal; Notable for the following components:   Glucose-Capillary 126 (*)    All other components within normal limits  URINALYSIS, ROUTINE W REFLEX MICROSCOPIC    EKG None  Radiology No results found.  Procedures Procedures  {Document cardiac monitor, telemetry assessment procedure when appropriate:1}  Medications Ordered in ED Medications - No data to display  ED Course/ Medical Decision Making/ A&P   {   Click here for ABCD2, HEART and other calculatorsREFRESH Note before signing :1}                              Medical Decision Making 89 year old female with history as above presenting for generalized weakness and some mild confusion.  Afebrile and hemodynamically stable.  Focal neurologic deficits.  No abdominal tenderness or adventitious lung sounds on exam.  Dry oral mucosa consistent with dehydration in the setting of loose stools following MiraLAX use for constipation.  No recent antibiotics.  Low suspicion for C. difficile colitis.  Will evaluate for infectious causes of generalized weakness such as UTI and pneumonia.  Will obtain laboratory workup to evaluate for anemia, leukocytosis or electrolyte imbalance.  History of CKD.  Will evaluate renal function with metabolic panel.  Will provide IV fluids for rehydration and reassess  Amount and/or Complexity of Data Reviewed Labs: ordered. Radiology: ordered.   ***  {Document critical care time when appropriate:1} {Document review of labs and clinical decision tools ie heart score, Chads2Vasc2 etc:1}  {Document your independent review of radiology images, and any outside records:1} {Document your discussion with family members, caretakers, and with consultants:1} {Document social determinants of health affecting pt's care:1} {Document your decision making why or why not admission, treatments were needed:1} Final Clinical Impression(s) / ED Diagnoses Final  diagnoses:  None    Rx / DC Orders ED Discharge Orders     None

## 2023-04-01 ENCOUNTER — Observation Stay (HOSPITAL_COMMUNITY): Payer: Medicare Other

## 2023-04-01 ENCOUNTER — Observation Stay (HOSPITAL_BASED_OUTPATIENT_CLINIC_OR_DEPARTMENT_OTHER): Payer: Medicare Other

## 2023-04-01 ENCOUNTER — Encounter (HOSPITAL_COMMUNITY): Payer: Self-pay | Admitting: Internal Medicine

## 2023-04-01 DIAGNOSIS — R451 Restlessness and agitation: Secondary | ICD-10-CM | POA: Diagnosis not present

## 2023-04-01 DIAGNOSIS — I471 Supraventricular tachycardia, unspecified: Secondary | ICD-10-CM | POA: Diagnosis not present

## 2023-04-01 DIAGNOSIS — I4891 Unspecified atrial fibrillation: Secondary | ICD-10-CM

## 2023-04-01 DIAGNOSIS — Z8719 Personal history of other diseases of the digestive system: Secondary | ICD-10-CM | POA: Diagnosis not present

## 2023-04-01 DIAGNOSIS — R4182 Altered mental status, unspecified: Secondary | ICD-10-CM | POA: Diagnosis not present

## 2023-04-01 DIAGNOSIS — I10 Essential (primary) hypertension: Secondary | ICD-10-CM

## 2023-04-01 DIAGNOSIS — I480 Paroxysmal atrial fibrillation: Secondary | ICD-10-CM | POA: Diagnosis not present

## 2023-04-01 DIAGNOSIS — N1831 Chronic kidney disease, stage 3a: Secondary | ICD-10-CM | POA: Diagnosis not present

## 2023-04-01 DIAGNOSIS — K219 Gastro-esophageal reflux disease without esophagitis: Secondary | ICD-10-CM | POA: Diagnosis not present

## 2023-04-01 DIAGNOSIS — R531 Weakness: Secondary | ICD-10-CM

## 2023-04-01 DIAGNOSIS — R7989 Other specified abnormal findings of blood chemistry: Secondary | ICD-10-CM | POA: Diagnosis not present

## 2023-04-01 DIAGNOSIS — I672 Cerebral atherosclerosis: Secondary | ICD-10-CM | POA: Diagnosis not present

## 2023-04-01 LAB — ECHOCARDIOGRAM COMPLETE
AR max vel: 2.68 cm2
AV Area VTI: 2.58 cm2
AV Area mean vel: 2.58 cm2
AV Mean grad: 4 mm[Hg]
AV Peak grad: 6.7 mm[Hg]
Ao pk vel: 1.29 m/s
Area-P 1/2: 3.59 cm2
Height: 67 in
S' Lateral: 2.1 cm
Weight: 2225.76 [oz_av]

## 2023-04-01 LAB — COMPREHENSIVE METABOLIC PANEL
ALT: 11 U/L (ref 0–44)
AST: 25 U/L (ref 15–41)
Albumin: 3.1 g/dL — ABNORMAL LOW (ref 3.5–5.0)
Alkaline Phosphatase: 92 U/L (ref 38–126)
Anion gap: 9 (ref 5–15)
BUN: 10 mg/dL (ref 8–23)
CO2: 21 mmol/L — ABNORMAL LOW (ref 22–32)
Calcium: 8.4 mg/dL — ABNORMAL LOW (ref 8.9–10.3)
Chloride: 108 mmol/L (ref 98–111)
Creatinine, Ser: 0.72 mg/dL (ref 0.44–1.00)
GFR, Estimated: >60 mL/min (ref >60)
Glucose, Bld: 96 mg/dL (ref 70–99)
Potassium: 4.5 mmol/L (ref 3.5–5.1)
Sodium: 138 mmol/L (ref 135–145)
Total Bilirubin: 1.4 mg/dL — ABNORMAL HIGH (ref 0.3–1.2)
Total Protein: 6.4 g/dL — ABNORMAL LOW (ref 6.5–8.1)

## 2023-04-01 LAB — TROPONIN I (HIGH SENSITIVITY)
Troponin I (High Sensitivity): 37 ng/L — ABNORMAL HIGH (ref <18)
Troponin I (High Sensitivity): 44 ng/L — ABNORMAL HIGH (ref <18)

## 2023-04-01 LAB — CBC
HCT: 38.8 % (ref 36.0–46.0)
Hemoglobin: 12.2 g/dL (ref 12.0–15.0)
MCH: 26 pg (ref 26.0–34.0)
MCHC: 31.4 g/dL (ref 30.0–36.0)
MCV: 82.7 fL (ref 80.0–100.0)
Platelets: 318 10*3/uL (ref 150–400)
RBC: 4.69 MIL/uL (ref 3.87–5.11)
RDW: 16 % — ABNORMAL HIGH (ref 11.5–15.5)
WBC: 7.8 10*3/uL (ref 4.0–10.5)
nRBC: 0 % (ref 0.0–0.2)

## 2023-04-01 MED ORDER — LISINOPRIL 20 MG PO TABS
40.0000 mg | ORAL_TABLET | Freq: Every day | ORAL | Status: DC
  Administered 2023-04-01 – 2023-04-02 (×2): 40 mg via ORAL
  Filled 2023-04-01 (×2): qty 2

## 2023-04-01 MED ORDER — AMLODIPINE BESYLATE 5 MG PO TABS: 10.0000 mg | ORAL_TABLET | Freq: Every day | ORAL | Status: DC

## 2023-04-01 MED ORDER — SODIUM CHLORIDE 0.9% FLUSH
3.0000 mL | Freq: Two times a day (BID) | INTRAVENOUS | Status: DC
  Administered 2023-04-01 – 2023-04-04 (×7): 3 mL via INTRAVENOUS

## 2023-04-01 MED ORDER — HYDRALAZINE HCL 20 MG/ML IJ SOLN: 5.0000 mg | Freq: Three times a day (TID) | INTRAMUSCULAR | Status: DC | PRN

## 2023-04-01 MED ORDER — ENOXAPARIN SODIUM 30 MG/0.3ML IJ SOSY
30.0000 mg | PREFILLED_SYRINGE | INTRAMUSCULAR | Status: DC
  Administered 2023-04-01: 30 mg via SUBCUTANEOUS
  Filled 2023-04-01: qty 0.3

## 2023-04-01 MED ORDER — ENOXAPARIN SODIUM 40 MG/0.4ML IJ SOSY
40.0000 mg | PREFILLED_SYRINGE | INTRAMUSCULAR | Status: DC
  Administered 2023-04-02 – 2023-04-03 (×2): 40 mg via SUBCUTANEOUS
  Filled 2023-04-01 (×2): qty 0.4

## 2023-04-01 MED ORDER — ONDANSETRON HCL 4 MG/2ML IJ SOLN: 4.0000 mg | Freq: Four times a day (QID) | INTRAMUSCULAR | Status: DC | PRN

## 2023-04-01 MED ORDER — ACETAMINOPHEN 650 MG RE SUPP: 650.0000 mg | Freq: Four times a day (QID) | RECTAL | Status: DC | PRN

## 2023-04-01 MED ORDER — METOPROLOL TARTRATE 50 MG PO TABS
50.0000 mg | ORAL_TABLET | Freq: Two times a day (BID) | ORAL | Status: DC
  Administered 2023-04-01 – 2023-04-04 (×7): 50 mg via ORAL
  Filled 2023-04-01 (×7): qty 1

## 2023-04-01 MED ORDER — SODIUM CHLORIDE 0.9% FLUSH: 3.0000 mL | INTRAVENOUS | Status: DC | PRN

## 2023-04-01 MED ORDER — METOPROLOL TARTRATE 25 MG PO TABS
50.0000 mg | ORAL_TABLET | Freq: Once | ORAL | Status: AC
  Administered 2023-04-01: 50 mg via ORAL
  Filled 2023-04-01: qty 2

## 2023-04-01 MED ORDER — PANTOPRAZOLE SODIUM 40 MG PO TBEC
40.0000 mg | DELAYED_RELEASE_TABLET | Freq: Every day | ORAL | Status: DC
  Administered 2023-04-01 – 2023-04-04 (×4): 40 mg via ORAL
  Filled 2023-04-01 (×4): qty 1

## 2023-04-01 MED ORDER — LACTATED RINGERS IV SOLN: INTRAVENOUS | Status: DC

## 2023-04-01 MED ORDER — ONDANSETRON HCL 4 MG PO TABS: 4.0000 mg | ORAL_TABLET | Freq: Four times a day (QID) | ORAL | Status: DC | PRN

## 2023-04-01 MED ORDER — SODIUM CHLORIDE 0.9 % IV SOLN: 250.0000 mL | INTRAVENOUS | Status: DC | PRN

## 2023-04-01 MED ORDER — AMLODIPINE BESYLATE 10 MG PO TABS
10.0000 mg | ORAL_TABLET | Freq: Every day | ORAL | Status: DC
  Administered 2023-04-02 – 2023-04-04 (×3): 10 mg via ORAL
  Filled 2023-04-01 (×3): qty 1

## 2023-04-01 MED ORDER — ACETAMINOPHEN 325 MG PO TABS
650.0000 mg | ORAL_TABLET | Freq: Four times a day (QID) | ORAL | Status: DC | PRN
Start: 2023-04-01 — End: 2023-04-05
  Administered 2023-04-04: 650 mg via ORAL
  Filled 2023-04-01: qty 2

## 2023-04-01 NOTE — Subjective & Objective (Signed)
Pt seen and examined. Met with pt's dtr and grand-dtr at bedside. Grand dtr states pt is confused. Pt lives with dtr but dtr does not state that pt is confused.  Dtr wants pt to go to SNF at discharge.

## 2023-04-01 NOTE — Progress Notes (Signed)
   RN reports family saying pt has had 2 weeks of loose stools. They did not mention this to me during hospital visit today.  Will send c. Diff and GI viral diarrhea panel. Hold on immodium for now. No leukocytosis.  Carollee Herter, DO [facility]

## 2023-04-01 NOTE — Evaluation (Signed)
Physical Therapy Evaluation [name] MRN: [id number] DOB: Feb 14, 1922 Today's Date: 04/01/2023  History of Present Illness  Pt is a 89 yo female admitted 10/28 for generalized weakness. PMH: HTN, aortic valve regurgitation, paroxysmal SVT, GIB, breast CA, CKD, and GERD  Clinical Impression  Pt pleasantly confused and per chart she walks limited distance at home. This session limited by inability to initiate, weakness, decreased balance and transfers. PT max +2 assist to transfer OOB to chair without family present to confirm PLOF or assist at home. Pt will benefit from acute therapy to maximize mobility, balance and function to decrease burden of care.         If plan is discharge home, recommend the following: Two people to help with walking and/or transfers;Two people to help with bathing/dressing/bathroom;Direct supervision/assist for medications management;Assist for transportation;Supervision due to cognitive status;Help with stairs or ramp for entrance   Can travel by private vehicle   No    Equipment Recommendations Hoyer lift;Hospital bed;Wheelchair (measurements PT);Wheelchair cushion (measurements PT)  Recommendations for Other Services       Functional Status Assessment Patient has had a recent decline in their functional status and/or demonstrates limited ability to make significant improvements in function in a reasonable and predictable amount of time     Precautions / Restrictions Precautions Precautions: Fall Restrictions Weight Bearing Restrictions: No      Mobility  Bed Mobility Overal bed mobility: Needs Assistance Bed Mobility: Supine to Sit     Supine to sit: Max assist     General bed mobility comments: max assist to attempt to roll, max assist to pivot pelvs to EOB then min assist with increased cues to elevate trunk from surface    Transfers Overall transfer level: Needs assistance   Transfers: Sit to/from Stand, Bed  to chair/wheelchair/BSC Sit to Stand: Max assist, +2 physical assistance Stand pivot transfers: Max assist, +2 physical assistance         General transfer comment: max +2 to stand from bed x 2 trials with feet blocked. Pt with tendency for knee extension, hip and trunk flexion with posterior lean with trying to stand. UB support on therapists with assist of belt to stand pivot to chair. Pt unable to achieve upright and no improvement with presence of RW as visual target for standing    Ambulation/Gait               General Gait Details: unable  Stairs            Wheelchair Mobility     Tilt Bed    Modified Rankin (Stroke Patients Only)       Balance Overall balance assessment: Needs assistance Sitting-balance support: Feet supported, No upper extremity supported Sitting balance-Leahy Scale: Fair Sitting balance - Comments: EOB with CGA   Standing balance support: During functional activity, Bilateral upper extremity supported Standing balance-Leahy Scale: Zero Standing balance comment: max +2 assist for standing                             Pertinent Vitals/Pain Pain Assessment Pain Assessment: No/denies pain    Home Living Family/patient expects to be discharged to:: Private residence Living Arrangements: Children Available Help at Discharge: Available 24 hours/day;Family Type of Home: House Home Access: Stairs to enter   [employer] of Steps: 2   Home Layout: One level Home Equipment: Agricultural consultant (2 wheels);BSC/3in1 Additional Comments: home setup taken from prior  admission as pt providing conflicting reports    Prior Function Prior Level of Function : Needs assist             Mobility Comments: reports walking limited distance with RW, daughter helps with stairs ADLs Comments: daughter helps with ADLs     Extremity/Trunk Assessment   Upper Extremity Assessment Upper Extremity Assessment: Generalized weakness     Lower Extremity Assessment Lower Extremity Assessment: Generalized weakness    Cervical / Trunk Assessment Cervical / Trunk Assessment: Kyphotic  Communication   Communication Communication: No apparent difficulties  Cognition Arousal: Alert Behavior During Therapy: WFL for tasks assessed/performed Overall Cognitive Status: No family/caregiver present to determine baseline cognitive functioning Area of Impairment: Orientation, Attention, Memory                 Orientation Level: Disoriented to, Place, Time, Situation Current Attention Level: Sustained                    General Comments      Exercises     Assessment/Plan    PT Assessment Patient needs continued PT services  PT Problem List Decreased strength;Decreased mobility;Decreased activity tolerance;Decreased balance;Decreased cognition;Decreased knowledge of use of DME;Decreased coordination       PT Treatment Interventions DME instruction;Gait training;Balance training;Functional mobility training;Cognitive remediation;Therapeutic activities;Patient/family education;Neuromuscular re-education;Therapeutic exercise    PT Goals (Current goals can be found in the Care Plan section)  Acute Rehab PT Goals PT Goal Formulation: Patient unable to participate in goal setting Time For Goal Achievement: 04/15/23 Potential to Achieve Goals: Fair    Frequency Min 2X/week     Co-evaluation               AM-PAC PT "6 Clicks" Mobility  Outcome Measure Help needed turning from your back to your side while in a flat bed without using bedrails?: Total Help needed moving from lying on your back to sitting on the side of a flat bed without using bedrails?: Total Help needed moving to and from a bed to a chair (including a wheelchair)?: Total Help needed standing up from a chair using your arms (e.g., wheelchair or bedside chair)?: Total Help needed to walk in hospital room?: Total Help needed climbing 3-5  steps with a railing? : Total 6 Click Score: 6    End of Session Equipment Utilized During Treatment: Gait belt Activity Tolerance: Patient limited by fatigue Patient left: in chair;with call bell/phone within reach;with chair alarm set Nurse Communication: Mobility status;Need for lift equipment PT Visit Diagnosis: Other abnormalities of gait and mobility (R26.89);Muscle weakness (generalized) (M62.81)    Time: 1610-9604 PT Time Calculation (min) (ACUTE ONLY): 30 min   Charges:   PT Evaluation $PT Eval Moderate Complexity: 1 Mod PT Treatments $Therapeutic Activity: 8-22 mins PT General Charges $$ ACUTE PT VISIT: 1 Visit         Merryl Hacker, PT Acute Rehabilitation Services Office: [phone]   Ogle Hoeffner B Jalyn Dutta 04/01/2023, 10:02 AM

## 2023-04-01 NOTE — Assessment & Plan Note (Signed)
Likely due to CKD and not ACS.

## 2023-04-01 NOTE — ED Notes (Signed)
ED TO INPATIENT HANDOFF REPORT  ED Nurse Name and Phone #: Christean Leaf [phone]  S Name/Age/Gender [name] 89 y.o. female Room/Bed: 033C/033C  Code Status   Code Status: Full Code  Home/SNF/Other Home Patient oriented to: self, place, and time Is this baseline? Yes   Triage Complete: Triage complete  Chief Complaint Generalized weakness [R53.1]  Triage Note No notes on file   Allergies Allergies  Allergen Reactions   Codeine Nausea And Vomiting    Level of Care/Admitting Diagnosis ED Disposition     ED Disposition  Admit   Condition  --   Comment  Hospital Area: [facility] [[id number]]  Level of Care: Telemetry Medical [104]  May place patient in observation at [facility] or [facility] if equivalent level of care is available:: No  Covid Evaluation: Asymptomatic - no recent exposure (last 10 days) testing not required  Diagnosis: Generalized weakness [952841]  Admitting Physician: Tereasa Coop [[id number]]  Attending Physician: Tereasa Coop [[id number]]          B Medical/Surgery History Past Medical History:  Diagnosis Date   Arthritis    Cancer (HCC) 2011, 2012   left breast cancer 2011 central excision,2012 mastectomy   Complication of anesthesia    shortness of breath after procedure   Glaucoma    Hypertension    Protein-calorie malnutrition, severe (HCC)    SVT (supraventricular tachycardia) (HCC)    Upper GI bleed 01/2013.    Melena. HPylori negative gastritis on EGD.    Past Surgical History:  Procedure Laterality Date   ABDOMINAL HYSTERECTOMY     BREAST SURGERY  2011   BREAST SURGERY  2012   lt breast masty   CHOLECYSTECTOMY     ESOPHAGOGASTRODUODENOSCOPY N/A 01/25/2013   Procedure: ESOPHAGOGASTRODUODENOSCOPY (EGD);  Surgeon: Beverley Fiedler, MD;  Location: [facility] ENDOSCOPY;  Service: Gastroenterology;  Laterality: N/A;   ESOPHAGOGASTRODUODENOSCOPY (EGD) WITH PROPOFOL  08/04/2014   Procedure: ESOPHAGOGASTRODUODENOSCOPY  (EGD) WITH PROPOFOL;  Surgeon: Louis Meckel, MD;  Location: [facility] ENDOSCOPY;  Service: Endoscopy;;     A IV Location/Drains/Wounds Patient Lines/Drains/Airways Status     Active Line/Drains/Airways     Name Placement date Placement time Site Days   Peripheral IV 03/31/23 20 G Anterior;Distal;Right Forearm 03/31/23  1803  Forearm  1            Intake/Output Last 24 hours No intake or output data in the 24 hours ending 04/01/23 0236  Labs/Imaging Results for orders placed or performed during the hospital encounter of 03/31/23 (from the past 48 hour(s))  CBG monitoring, ED     Status: Abnormal   Collection Time: 03/31/23  6:01 PM  Result Value Ref Range   Glucose-Capillary 126 (H) 70 - 99 mg/dL    Comment: Glucose reference range applies only to samples taken after fasting for at least 8 hours.  Basic metabolic panel     Status: Abnormal   Collection Time: 03/31/23  6:04 PM  Result Value Ref Range   Sodium 137 135 - 145 mmol/L   Potassium 4.2 3.5 - 5.1 mmol/L   Chloride 105 98 - 111 mmol/L   CO2 20 (L) 22 - 32 mmol/L   Glucose, Bld 119 (H) 70 - 99 mg/dL    Comment: Glucose reference range applies only to samples taken after fasting for at least 8 hours.   BUN 14 8 - 23 mg/dL   Creatinine, Ser 4.40 0.44 - 1.00 mg/dL   Calcium 9.0 8.9 -  10.3 mg/dL   GFR, Estimated 51 (L) >60 mL/min    Comment: (NOTE) Calculated using the CKD-EPI Creatinine Equation (2021)    Anion gap 12 5 - 15    Comment: Performed at [facility], [address], [place], [place] [address]  CBC     Status: Abnormal   Collection Time: 03/31/23  6:04 PM  Result Value Ref Range   WBC 9.3 4.0 - 10.5 K/uL   RBC 4.92 3.87 - 5.11 MIL/uL   Hemoglobin 12.7 12.0 - 15.0 g/dL   HCT 86.5 78.4 - 69.6 %   MCV 83.1 80.0 - 100.0 fL   MCH 25.8 (L) 26.0 - 34.0 pg   MCHC 31.1 30.0 - 36.0 g/dL   RDW 29.5 (H) 28.4 - 13.2 %   Platelets 333 150 - 400 K/uL   nRBC 0.0 0.0 - 0.2 %    Comment: Performed at [facility], [address], [place], [place] [address]  Urinalysis, Routine w reflex microscopic -Urine, Catheterized     Status: Abnormal   Collection Time: 03/31/23  8:55 PM  Result Value Ref Range   Color, Urine YELLOW YELLOW   APPearance CLEAR CLEAR   Specific Gravity, Urine 1.011 1.005 - 1.030   pH 6.0 5.0 - 8.0   Glucose, UA NEGATIVE NEGATIVE mg/dL   Hgb urine dipstick SMALL (A) NEGATIVE   Bilirubin Urine NEGATIVE NEGATIVE   Ketones, ur NEGATIVE NEGATIVE mg/dL   Protein, ur NEGATIVE NEGATIVE mg/dL   Nitrite NEGATIVE NEGATIVE   Leukocytes,Ua NEGATIVE NEGATIVE   RBC / HPF 6-10 0 - 5 RBC/hpf   WBC, UA 0-5 0 - 5 WBC/hpf   Bacteria, UA NONE SEEN NONE SEEN   Squamous Epithelial / HPF 0-5 0 - 5 /HPF    Comment: Performed at [facility], [address], [place], [place] [address]  Troponin I (High Sensitivity)     Status: Abnormal   Collection Time: 04/01/23 12:43 AM  Result Value Ref Range   Troponin I (High Sensitivity) 37 (H) <18 ng/L    Comment: (NOTE) Elevated high sensitivity troponin I (hsTnI) values and significant  changes across serial measurements may suggest ACS but many other  chronic and acute conditions are known to elevate hsTnI results.  Refer to the "Links" section for chest pain algorithms and additional  guidance. Performed at [facility], [address] [place], [place] [address]    DG Chest Portable 1 View  Result Date: 03/31/2023 CLINICAL DATA:  Lower extremity weakness and hypertension. Suspected pneumonia. EXAM: PORTABLE CHEST 1 VIEW COMPARISON:  AP and lateral 02/19/2018 FINDINGS: There is mild cardiomegaly but no evidence of CHF. Stable mediastinum with aortic tortuosity and patchy calcification. Low inspiration noted on exam. No focal pneumonia is evident allowing for low volumes. No substantial pleural effusion. There is osteopenia. Degenerative changes of the spine and both shoulders. Patient's chin partially obscures the right apex.  Patient is rotated to the right. IMPRESSION: 1. Low inspiration without evidence of acute chest disease. 2. Mild cardiomegaly. 3. Aortic atherosclerosis. Electronically Signed   By: [name] M.D.   On: 03/31/2023 22:04    Pending Labs Unresulted Labs (From admission, onward)     Start     Ordered   04/01/23 0500  Comprehensive metabolic panel  Tomorrow morning,   R        04/01/23 0122   04/01/23 0500  CBC  Tomorrow morning,   R  04/01/23 0122            Vitals/Pain Today's Vitals   04/01/23 0105 04/01/23 0115 04/01/23 0207 04/01/23 0215  BP: (!) 161/104 (!) 150/90 (!) 177/86 (!) 164/104  Pulse: 92 87 74 72  Resp: (!) 25 (!) 23 (!) 21 (!) 25  Temp:   97.9 F (36.6 C)   TempSrc:   Oral   SpO2: 97% 98% 100% 100%  Weight:      Height:        Isolation Precautions No active isolations  Medications Medications  OLANZapine (ZYPREXA) tablet 2.5 mg (2.5 mg Oral Given 03/31/23 2354)  amLODipine (NORVASC) tablet 10 mg (has no administration in time range)  metoprolol tartrate (LOPRESSOR) tablet 50 mg (has no administration in time range)  pantoprazole (PROTONIX) EC tablet 40 mg (has no administration in time range)  enoxaparin (LOVENOX) injection 30 mg (has no administration in time range)  sodium chloride flush (NS) 0.9 % injection 3 mL (3 mLs Intravenous Not Given 04/01/23 0146)  sodium chloride flush (NS) 0.9 % injection 3 mL (has no administration in time range)  0.9 %  sodium chloride infusion (has no administration in time range)  acetaminophen (TYLENOL) tablet 650 mg (has no administration in time range)    Or  acetaminophen (TYLENOL) suppository 650 mg (has no administration in time range)  ondansetron (ZOFRAN) tablet 4 mg (has no administration in time range)    Or  ondansetron (ZOFRAN) injection 4 mg (has no administration in time range)  lactated ringers infusion ( Intravenous New Bag/Given 04/01/23 0146)  sodium chloride 0.9 % bolus 1,000 mL (0 mLs  Intravenous Stopped 03/31/23 2120)  metoprolol tartrate (LOPRESSOR) tablet 50 mg (50 mg Oral Given 04/01/23 0044)    Mobility walks with device at baseline, but refused here and family states she was unable to     Focused Assessments Cardiac Assessment Handoff:  Cardiac Rhythm: Atrial fibrillation No results found for: "CKTOTAL", "CKMB", "CKMBINDEX", "TROPONINI" No results found for: "DDIMER" Does the Patient currently have chest pain? No    R Recommendations: See Admitting Provider Note  Report given to:   Additional Notes: Pt was alert and oriented x3ish. Had a sundowning episode where she was more altered and agitated. Seriquil given and was better.

## 2023-04-01 NOTE — Hospital Course (Signed)
HPI: [name] is a 89 y.o. female with medical history significant of hypertension, aortic valve regurgitation, paroxysmal SVT on metoprolol, prior GI bleed, breast cancer, CKD 3A, and GERD presented to emergency department for the evaluation for generalized weakness.  Patient's son at the bedside reported that patient has increased generalized weakness making it hard for the patient to ambulate.  She is able to ambulate at the baseline with her own with the help of a walker.  Patient son reported currently patient taking MiraLAX for constipation but recently having multiple episodes of loose stool during the last 1 week.  Patient does not have any evidence of GI bleed.  Family also voicing concern for increasing confusion.  Patient does not have any fall, chest pain, palpitation, shortness of breath, nausea, vomiting, fever and chills. History is very limited from the patient due to underlying dementia.  Patient is alert but not completely oriented to self, place and person.  She is pleasantly confused.   Per chart patient was admitted in May 2023 for evaluation for GI bleed found to have GI bleed secondary to diverticular origin.  GI deferred EGD and colonoscopy due to advanced age.  She also had history of paroxysmal SVT on Toprol chronically.  Found out that she had chronic generalized weakness and deconditioning and seen by PT and recommendation home health with home PT.     ED Course:  At presentation to ED patient heart rate 88, respiratory 18, blood pressure 150/75 and O2 sat 100% room air. POC blood glucose 126 WNL.  BMP unremarkable except slightly low bicarb 20.  CBC grossly unremarkable.  UA unremarkable. High sensitive troponin in process.    EKG showed atrial fibrillation, premature ventricular complex and heart rate 99.  Chest x-ray mild cardiomegaly and aortic atherosclerosis. As EKG atrial fibrillation in the ED patient has been given Lopressor 50 mg 1 dose.  Patient also had  some agitation and got Zyprexa with has been calm patient down.  Physical exam patient looked dehydrated however no evidence of acute kidney injury.  Received 1 L of LR bolus in the ED. Dr. Manus Gunning is mostly concerned about generalized weakness likely development of in the setting of atrial fibrillation and requesting admission for managing of general weakness.  Significant Events: Admitted 03/31/2023 for weakness   Significant Labs:   Significant Imaging Studies: Admitting Cxr negative for pneumonia  Antibiotic Therapy: Anti-infectives (From admission, onward)    None       Procedures:   Consultants:

## 2023-04-01 NOTE — Assessment & Plan Note (Signed)
Stable

## 2023-04-01 NOTE — Plan of Care (Signed)

## 2023-04-01 NOTE — Evaluation (Signed)
Occupational Therapy Evaluation [name] MRN: [id number] DOB: Jul 03, 1921 Today's Date: 04/01/2023   History of Present Illness Pt is a 89 yo female admitted 10/28 for generalized weakness. PMH: HTN, aortic valve regurgitation, paroxysmal SVT, GIB, breast CA, CKD, dementia and GERD   Clinical Impression   [name] was evaluated s/p the above admission list. Per family, she is ambulatory with RW at baseline and needs minimal assist with ADLs. Upon evaluation the pt was limited by impaired cognition, weakness, confusion, active resistance and limited activity tolerance. Overall she needed max A +2 fro standing attempts and total A +2 for dependent transfer from the chair to the bed with use of the stedy. Due to the deficits listed below the pt also needs up to total A +2 for LB ADLs and max A for UB ADLs. Pt with seemingly good strength, limited mostly by confusion and cognition. Pt will benefit from continued acute OT services and skilled inpatient follow up therapy, <3 hours/day.        If plan is discharge home, recommend the following: Two people to help with walking and/or transfers;A lot of help with walking and/or transfers;A lot of help with bathing/dressing/bathroom;Two people to help with bathing/dressing/bathroom;Assistance with cooking/housework;Direct supervision/assist for medications management;Direct supervision/assist for financial management;Assist for transportation;Help with stairs or ramp for entrance    Functional Status Assessment  Patient has had a recent decline in their functional status and demonstrates the ability to make significant improvements in function in a reasonable and predictable amount of time.  Equipment Recommendations  None recommended by OT       Precautions / Restrictions Precautions Precautions: Fall Restrictions Weight Bearing Restrictions: No      Mobility Bed Mobility Overal bed mobility: Needs Assistance Bed  Mobility: Sit to Supine       Sit to supine: Total assist, +2 for physical assistance, +2 for safety/equipment   General bed mobility comments: total A due to active resistance    Transfers Overall transfer level: Needs assistance Equipment used: 2 person hand held assist, Ambulation equipment used Transfers: Sit to/from Stand, Bed to chair/wheelchair/BSC Sit to Stand: Max assist, +2 physical assistance, +2 safety/equipment           General transfer comment: max A +2 to stand initially, pt actively resisting once she realized she was going back to bed. max A +2 to stand in stedy frame and total A to get from chair to bed with stedy Transfer via Lift Equipment: Stedy    Balance Overall balance assessment: Needs assistance Sitting-balance support: Feet supported, No upper extremity supported Sitting balance-Leahy Scale: Fair     Standing balance support: During functional activity, Bilateral upper extremity supported Standing balance-Leahy Scale: Zero                             ADL either performed or assessed with clinical judgement   ADL Overall ADL's : Needs assistance/impaired Eating/Feeding: Minimal assistance   Grooming: Minimal assistance   Upper Body Bathing: Maximal assistance;Sitting   Lower Body Bathing: Total assistance;+2 for physical assistance;+2 for safety/equipment   Upper Body Dressing : Maximal assistance   Lower Body Dressing: Total assistance;+2 for physical assistance;+2 for safety/equipment   Toilet Transfer: +2 for physical assistance;+2 for safety/equipment;Total assistance   Toileting- Clothing Manipulation and Hygiene: Total assistance;+2 for physical assistance;+2 for safety/equipment       Functional mobility during ADLs: Maximal assistance;Total assistance;+2 for physical assistance;+2  for safety/equipment General ADL Comments: pt limite dby cognition and confusion, actively resisting most tasks therefore needing  significant assist     Vision Baseline Vision/History: 0 No visual deficits Vision Assessment?: No apparent visual deficits     Perception Perception: Not tested       Praxis Praxis: Not tested       Pertinent Vitals/Pain Pain Assessment Pain Assessment: No/denies pain     Extremity/Trunk Assessment Upper Extremity Assessment Upper Extremity Assessment: Generalized weakness (difficult to assess due to cognition, pt actively resisting durign transfers with good strength)   Lower Extremity Assessment Lower Extremity Assessment: Defer to PT evaluation   Cervical / Trunk Assessment Cervical / Trunk Assessment: Kyphotic   Communication Communication Communication: No apparent difficulties   Cognition Arousal: Alert Behavior During Therapy: WFL for tasks assessed/performed Overall Cognitive Status: History of cognitive impairments - at baseline         General Comments: per family hx of dementia - pt apparently confused throughout the session, not following commands and needs maximal multimodal cues     General Comments  VSS on RA, family present            Home Living Family/patient expects to be discharged to:: Private residence Living Arrangements: Children Available Help at Discharge: Available 24 hours/day;Family Type of Home: House Home Access: Stairs to enter Secretary/administrator of Steps: 2 Entrance Stairs-Rails: Left Home Layout: One level     Bathroom Shower/Tub: Chief Strategy Officer: Standard Bathroom Accessibility: Yes   Home Equipment: Agricultural consultant (2 wheels);BSC/3in1          Prior Functioning/Environment Prior Level of Function : Needs assist             Mobility Comments: reports walking limited distance with RW, daughter helps with stairs ADLs Comments: daughter helps with ADLs        OT Problem List: Decreased strength;Decreased range of motion;Decreased activity tolerance;Impaired balance (sitting and/or  standing);Decreased cognition;Decreased safety awareness;Decreased knowledge of use of DME or AE;Decreased knowledge of precautions      OT Treatment/Interventions: Self-care/ADL training;Therapeutic exercise;DME and/or AE instruction;Therapeutic activities;Patient/family education;Balance training;Cognitive remediation/compensation    OT Goals(Current goals can be found in the care plan section) Acute Rehab OT Goals Patient Stated Goal: to sit up OT Goal Formulation: With patient/family Time For Goal Achievement: 04/15/23 Potential to Achieve Goals: Good ADL Goals Pt Will Perform Grooming: with set-up Pt Will Perform Upper Body Dressing: with set-up Pt Will Transfer to Toilet: with mod assist;stand pivot transfer;bedside commode Additional ADL Goal #1: pt will follow simple 1 step commands 75% of the time as a precursor to higher level ADLs Additional ADL Goal #2: Pt will complete bed mobility with min A as a precursor to ADLs  OT Frequency: Min 1X/week       AM-PAC OT "6 Clicks" Daily Activity     Outcome Measure Help from another person eating meals?: A Little Help from another person taking care of personal grooming?: A Little Help from another person toileting, which includes using toliet, bedpan, or urinal?: Total Help from another person bathing (including washing, rinsing, drying)?: A Lot Help from another person to put on and taking off regular upper body clothing?: A Lot Help from another person to put on and taking off regular lower body clothing?: Total 6 Click Score: 12   End of Session Equipment Utilized During Treatment: Gait belt Nurse Communication: Mobility status  Activity Tolerance: Patient tolerated treatment well Patient left: in bed;with  call bell/phone within reach (going to CT)  OT Visit Diagnosis: Unsteadiness on feet (R26.81);Other abnormalities of gait and mobility (R26.89);Muscle weakness (generalized) (M62.81)                Time: 1610-9604 OT Time  Calculation (min): 17 min Charges:  OT General Charges $OT Visit: 1 Visit OT Evaluation $OT Eval Moderate Complexity: 1 Mod  Derenda Mis, OTR/L Acute Rehabilitation Services Office [phone] Secure Chat Communication Preferred   [name] 04/01/2023, 1:00 PM

## 2023-04-01 NOTE — Assessment & Plan Note (Signed)
Chronic. 

## 2023-04-01 NOTE — H&P (Signed)
History and Physical    SAAHITHI KOVACEVIC WJX:[id number] DOB: [birth year] DOA: 03/31/2023  PCP: [name], MD   Patient coming from: Home   Chief Complaint:  Chief Complaint  Patient presents with   Weakness   ED TRIAGE note:  HPI:  [name] is a 89 y.o. female with medical history significant of hypertension, aortic valve regurgitation, paroxysmal SVT on metoprolol, prior GI bleed, breast cancer, CKD 3A, and GERD presented to emergency department for the evaluation for generalized weakness.  Patient's son at the bedside reported that patient has increased generalized weakness making it hard for the patient to ambulate.  She is able to ambulate at the baseline with her own with the help of a walker.  Patient son reported currently patient taking MiraLAX for constipation but recently having multiple episodes of loose stool during the last 1 week.  Patient does not have any evidence of GI bleed.  Family also voicing concern for increasing confusion.  Patient does not have any fall, chest pain, palpitation, shortness of breath, nausea, vomiting, fever and chills. History is very limited from the patient due to underlying dementia.  Patient is alert but not completely oriented to self, place and person.  She is pleasantly confused.  Per chart patient was admitted in May 2023 for evaluation for GI bleed found to have GI bleed secondary to diverticular origin.  GI deferred EGD and colonoscopy due to advanced age.  She also had history of paroxysmal SVT on Toprol chronically.  Found out that she had chronic generalized weakness and deconditioning and seen by PT and recommendation home health with home PT.   ED Course:  At presentation to ED patient heart rate 88, respiratory 18, blood pressure 150/75 and O2 sat 100% room air. POC blood glucose 126 WNL.  BMP unremarkable except slightly low bicarb 20.  CBC grossly unremarkable.  UA unremarkable. High sensitive troponin in process.     EKG showed atrial fibrillation, premature ventricular complex and heart rate 99.  Chest x-ray mild cardiomegaly and aortic atherosclerosis. As EKG atrial fibrillation in the ED patient has been given Lopressor 50 mg 1 dose.  Patient also had some agitation and got Zyprexa with has been calm patient down.  Physical exam patient looked dehydrated however no evidence of acute kidney injury.  Received 1 L of LR bolus in the ED. Dr. Manus Gunning is mostly concerned about generalized weakness likely development of in the setting of atrial fibrillation and requesting admission for managing of general weakness.   Review of Systems:  Review of Systems  Unable to perform ROS: Dementia (Limited review of the system due to dementia)  Constitutional:  Negative for malaise/fatigue.  Respiratory:  Negative for cough and shortness of breath.   Cardiovascular:  Negative for chest pain and leg swelling.  Musculoskeletal:  Negative for neck pain.  Neurological:  Positive for weakness. Negative for dizziness and headaches.    Past Medical History:  Diagnosis Date   Arthritis    Cancer (HCC) 2011, 2012   left breast cancer 2011 central excision,2012 mastectomy   Complication of anesthesia    shortness of breath after procedure   Glaucoma    Hypertension    Protein-calorie malnutrition, severe (HCC)    SVT (supraventricular tachycardia) (HCC)    Upper GI bleed 01/2013.    Melena. HPylori negative gastritis on EGD.     Past Surgical History:  Procedure Laterality Date   ABDOMINAL HYSTERECTOMY     BREAST SURGERY  2011   BREAST SURGERY  2012   lt breast masty   CHOLECYSTECTOMY     ESOPHAGOGASTRODUODENOSCOPY N/A 01/25/2013   Procedure: ESOPHAGOGASTRODUODENOSCOPY (EGD);  Surgeon: Beverley Fiedler, MD;  Location: [facility] ENDOSCOPY;  Service: Gastroenterology;  Laterality: N/A;   ESOPHAGOGASTRODUODENOSCOPY (EGD) WITH PROPOFOL  08/04/2014   Procedure: ESOPHAGOGASTRODUODENOSCOPY (EGD) WITH PROPOFOL;  Surgeon: Louis Meckel, MD;  Location: [facility] END[facility]COPY;  Service: Endoscopy;;     reports that she has never smoked. She has never used smokeless tobacco. She reports that she does not drink alcohol and does not use drugs.  Allergies  Allergen Reactions   Codeine Nausea And Vomiting    Family History  Problem Relation Age of Onset   Coronary artery disease Mother 73       MI    Prior to Admission medications   Medication Sig Start Date End Date Taking? Authorizing Provider  acetaminophen (TYLENOL) 500 MG tablet Take 2 tablets (1,000 mg total) by mouth every 6 (six) hours as needed for moderate pain (for pain). 01/03/21   [name], PA-C  amLODipine (NORVASC) 10 MG tablet TAKE 1 TABLET(10 MG) BY MOUTH DAILY 10/29/22   [name], MD  clonazePAM (KLONOPIN) 0.5 MG tablet Take 1 tablet (0.5 mg total) by mouth 2 (two) times daily as needed for anxiety. 08/26/22   [name], MD  diclofenac sodium (VOLTAREN) 1 % GEL Apply 2 g topically See admin instructions. Apply 2 grams to both knees for pain four times a day Patient taking differently: Apply 2 g topically 2 (two) times daily as needed (knee pain). 10/31/17   [name], MD  latanoprost (XALATAN) 0.005 % ophthalmic solution Place 1 drop into both eyes at bedtime. 06/17/18   [provider]  lisinopril (ZESTRIL) 40 MG tablet Take 1 tablet (40 mg total) by mouth daily. 09/09/22   [name], MD  metoprolol tartrate (LOPRESSOR) 50 MG tablet Take 1 tablet (50 mg total) by mouth 2 (two) times daily. 08/26/22   [name], MD  pantoprazole (PROTONIX) 40 MG tablet TAKE 1 TABLET(40 MG) BY MOUTH DAILY 08/26/22   [name], MD  sennosides-docusate sodium (SENOKOT-S) 8.6-50 MG tablet Take 1 tablet by mouth daily. 08/26/22   [name], MD  triamcinolone cream (KENALOG) 0.1 % Apply 1 Application topically 2 (two) times daily. 08/26/22   [name], MD     Physical Exam: Vitals:    04/01/23 0115 04/01/23 0207 04/01/23 0215 04/01/23 0300  BP: (!) 150/90 (!) 177/86 (!) 164/104 (!) 158/124  Pulse: 87 74 72 73  Resp: (!) 23 (!) 21 (!) 25 20  Temp:  97.9 F (36.6 C)    TempSrc:  Oral    SpO2: 98% 100% 100% 100%  Weight:      Height:        Physical Exam Constitutional:      General: She is not in acute distress.    Appearance: She is not ill-appearing.  HENT:     Head: Normocephalic.     Nose: Nose normal.     Mouth/Throat:     Mouth: Mucous membranes are moist.  Eyes:     Conjunctiva/sclera: Conjunctivae normal.  Cardiovascular:     Rate and Rhythm: Normal rate. Rhythm irregular.  Pulmonary:     Effort: Pulmonary effort is normal.     Breath sounds: Normal breath sounds.  Abdominal:     General: Bowel sounds are normal. There is no  distension.     Tenderness: There is no abdominal tenderness.  Musculoskeletal:     Cervical back: Neck supple.     Right lower leg: No edema.     Left lower leg: No edema.  Skin:    General: Skin is dry.  Neurological:     Comments: Patient is alert but not oriented to time, place and person  Psychiatric:     Comments: Unable to assess.      Labs on Admission: I have personally reviewed following labs and imaging studies  CBC: Recent Labs  Lab 03/31/23 1804 04/01/23 0225  WBC 9.3 7.8  HGB 12.7 12.2  HCT 40.9 38.8  MCV 83.1 82.7  PLT 333 318   Basic Metabolic Panel: Recent Labs  Lab 03/31/23 1804  NA 137  K 4.2  CL 105  CO2 20*  GLUCOSE 119*  BUN 14  CREATININE 0.99  CALCIUM 9.0   GFR: Estimated Creatinine Clearance: 30.1 mL/min (by C-G formula based on SCr of 0.99 mg/dL). Liver Function Tests: No results for input(s): "AST", "ALT", "ALKPHOS", "BILITOT", "PROT", "ALBUMIN" in the last 168 hours. No results for input(s): "LIPASE", "AMYLASE" in the last 168 hours. No results for input(s): "AMMONIA" in the last 168 hours. Coagulation Profile: No results for input(s): "INR", "PROTIME" in the last  168 hours. Cardiac Enzymes: Recent Labs  Lab 04/01/23 0043  TROPONINIHS 37*   BNP (last 3 results) No results for input(s): "BNP" in the last 8760 hours. HbA1C: No results for input(s): "HGBA1C" in the last 72 hours. CBG: Recent Labs  Lab 03/31/23 1801  GLUCAP 126*   Lipid Profile: No results for input(s): "CHOL", "HDL", "LDLCALC", "TRIG", "CHOLHDL", "LDLDIRECT" in the last 72 hours. Thyroid Function Tests: No results for input(s): "TSH", "T4TOTAL", "FREET4", "T3FREE", "THYROIDAB" in the last 72 hours. Anemia Panel: No results for input(s): "VITAMINB12", "FOLATE", "FERRITIN", "TIBC", "IRON", "RETICCTPCT" in the last 72 hours. Urine analysis:    Component Value Date/Time   COLORURINE YELLOW 03/31/2023 2055   APPEARANCEUR CLEAR 03/31/2023 2055   LABSPEC 1.011 03/31/2023 2055   PHURINE 6.0 03/31/2023 2055   GLUCOSEU NEGATIVE 03/31/2023 2055   GLUCOSEU NEGATIVE 12/05/2016 1533   HGBUR SMALL (A) 03/31/2023 2055   BILIRUBINUR NEGATIVE 03/31/2023 2055   BILIRUBINUR Neg 12/08/2017 1434   KETONESUR NEGATIVE 03/31/2023 2055   PROTEINUR NEGATIVE 03/31/2023 2055   UROBILINOGEN 0.2 12/08/2017 1434   UROBILINOGEN 1.0 12/05/2016 1533   NITRITE NEGATIVE 03/31/2023 2055   LEUKOCYTESUR NEGATIVE 03/31/2023 2055    Radiological Exams on Admission: I have personally reviewed images DG Chest Portable 1 View  Result Date: 03/31/2023 CLINICAL DATA:  Lower extremity weakness and hypertension. Suspected pneumonia. EXAM: PORTABLE CHEST 1 VIEW COMPARISON:  AP and lateral 02/19/2018 FINDINGS: There is mild cardiomegaly but no evidence of CHF. Stable mediastinum with aortic tortuosity and patchy calcification. Low inspiration noted on exam. No focal pneumonia is evident allowing for low volumes. No substantial pleural effusion. There is osteopenia. Degenerative changes of the spine and both shoulders. Patient's chin partially obscures the right apex. Patient is rotated to the right. IMPRESSION: 1.  Low inspiration without evidence of acute chest disease. 2. Mild cardiomegaly. 3. Aortic atherosclerosis. Electronically Signed   By: [name] M.D.   On: 03/31/2023 22:04    EKG: My personal interpretation of EKG shows: EKG showing atrial fibrillation heart rate 99, premature ventricular complex, left anterior fascicular block and abnormal R wave progression.    Assessment/Plan: Principal Problem:   Generalized weakness  Active Problems:   SVT (supraventricular tachycardia) (HCC)   Age-related physical debility   Paroxysmal atrial fibrillation (HCC)   Essential hypertension   GERD (gastroesophageal reflux disease)   History of GI bleed   CKD (chronic kidney disease) stage 3, GFR 30-59 ml/min (HCC)   Elevated troponin   Agitation-secondary to advanced age and a new environment.    Assessment and Plan: New onset of paroxysmal atrial fibrillation History of SVT -Patient has been brought to the ED via family for evaluation for worsening generalized weakness and declining from her baseline.  Patient did not have any fall.  Denies any chest pain or shortness of breath. - At presentation to ED patient found hemodynamically stable however gradually has blood pressure has been trending up to 177/86.  Heart rate between 54-110. -EKG showed atrial fibrillation heart rate 88.  Patient does have history of supraventricular tachycardia and she was on metoprolol 50 mg twice daily at home. - In the ED patient got Lopressor 50 mg once due to new onset of A-fib. - CHA2DS2-VASc score 4 based on age age and HTN.  4.8% risk of development of a stroke.  However due to advanced age, generalized weakness with high risk of fall and history of diverticular bleed in 2023 there is high risk of bleeding than development of a stroke so for this patient I am not initiating any oral anticoagulation at this time. - Plan to continue metoprolol 50 mg twice daily for now. - Obtaining echocardiogram. -Continue  cardiac monitoring. -On discharge refer patient to outpatient cardiology for follow-up.  Elevated troponin - First troponin 37.  EKG showed atrial fibrillation rate controlled.  No evidence of ST anterior abnormality.  Patient denies any chest pain.  ACS ruled out - Elevated troponin secondary demand ischemia in the context of  of atrial fibrillation and elevated blood pressure. -Continue to trend troponin. -Continue cardiac monitoring.  Generalized weakness Age-related debility -Patient's family reporting worsening generalized weakness.  At home patient use walker which she is not doing for last few days.  Family is very concerned. - CBC, CMP and UA grossly unremarkable.   -Chest x-ray unremarkable. -I believe patient has worsening generalized weakness in the setting of new onset of atrial fibrillation as well as declining physical status due to advanced age as well. - per patient's family request consulted PT and OT for evaluation. -Continue fall precaution.  Essential hypertension Elevated blood pressure -Blood pressure is persistently elevated to upper 160s to 170s range -Starting amlodipine 10 mg daily and lisinopril 40 mg daily.  Continue hydralazine as needed.  GERD History of GI bleed due to diverticular bleeding in 2023 -Patient family reported no active GI bleed or did not notice any dark discoloration of stool. -Continue Protonix 40 mg daily.   Chronic kidney disease 3A -Creatinine 0.9 and a GFR 51.  Renal function at baseline.  Continue to monitor  Agitation-resolved Early development of delirium? -EDP reported that in the ED patient was agitated 1 time which improved with Zyprexa 2.5 mg. - Plan to continue Zyprexa 2.5 mg at bedtime. -Continue delirium precaution and fall precaution.   DVT prophylaxis:  Lovenox Code Status:  Full Code.  Verified with patient's daughter and granddaughter over phone. Diet:  Family Communication: Called patient's granddaughter;  spoke with both granddaughter and daughter over phone.  Opportunity was given to ask question and all questions were answered satisfactorily.  Disposition Plan: Tentative discharge to home later today after echocardiogram and PT/OT evaluation. Consults: PT and  OT Admission status:   Observation, Telemetry bed  Severity of Illness: The appropriate patient status for this patient is OBSERVATION. Observation status is judged to be reasonable and necessary in order to provide the required intensity of service to ensure the patient's safety. The patient's presenting symptoms, physical exam findings, and initial radiographic and laboratory data in the context of their medical condition is felt to place them at decreased risk for further clinical deterioration. Furthermore, it is anticipated that the patient will be medically stable for discharge from the hospital within 2 midnights of admission.     Tereasa Coop, MD [facility]  How to contact the [facility] Attending or Consulting provider 7A - 7P or covering provider during after hours 7P -7A, for this patient.  Check the care team in [facility] and look for a) attending/consulting [facility] provider listed and b) the [facility] team listed Log into [email or web address] and use [facility] universal password to access. If you do not have the password, please contact the hospital operator. Locate the [facility] provider you are looking for under [facility] and page to a number that you can be directly reached. If you still have difficulty reaching the provider, please page the [facility] (Director on Call) for the Hospitalists listed on amion for assistance.  04/01/2023, 3:22 AM

## 2023-04-01 NOTE — Progress Notes (Addendum)
ALT 11  ALKPHOS 92  BILITOT 1.4*  PROT 6.4*  ALBUMIN 3.1*   CBG: Recent Labs  Lab 03/31/23 1801  GLUCAP 126*    Radiology Studies: ECHOCARDIOGRAM COMPLETE  Result Date: 04/01/2023    ECHOCARDIOGRAM REPORT   Patient Name:   [name] [facility] Date of Exam: 04/01/2023 Medical Rec #:  [id number]        Height:       67.0 in Accession #:    [id number]       Weight:       139.1 lb Date of Birth:  March 23, 1922         BSA:          1.733 m Patient Age:    89 years         BP:           176/91 mmHg Patient Gender: F                HR:           100 bpm. Exam Location:  Inpatient Procedure: 2D Echo, Cardiac Doppler and Color Doppler Indications:    Atrial Fibrillation  History:        Patient has prior history of Echocardiogram examinations, most                 recent 08/04/2014. Risk Factors:Hypertension.  Sonographer:    Karma Ganja Referring Phys: [id number] SUBRINA SUNDIL  Sonographer Comments: Image acquisition challenging due to patient body habitus. IMPRESSIONS  1. Left ventricular ejection fraction, by estimation, is 60 to 65%. The left ventricle has normal function.  The left ventricle has no regional wall motion abnormalities. There is moderate concentric left ventricular hypertrophy. Left ventricular diastolic parameters are indeterminate. Elevated left ventricular end-diastolic pressure.  2. Right ventricular systolic function is normal. The right ventricular size is normal. Tricuspid regurgitation signal is inadequate for assessing PA pressure.  3. The mitral valve is normal in structure. Trivial mitral valve regurgitation. No evidence of mitral stenosis.  4. The aortic valve is tricuspid. Aortic valve regurgitation is trivial. Aortic valve sclerosis is present, with no evidence of aortic valve stenosis. Aortic valve area, by VTI measures 2.58 cm. Aortic valve mean gradient measures 4.0 mmHg. Aortic valve Vmax measures 1.29 m/s.  5. The inferior vena cava is normal in size with <50% respiratory variability, suggesting right atrial pressure of 8 mmHg. FINDINGS  Left Ventricle: Left ventricular ejection fraction, by estimation, is 60 to 65%. The left ventricle has normal function. The left ventricle has no regional wall motion abnormalities. The left ventricular internal cavity size was normal in size. There is  moderate concentric left ventricular hypertrophy. Left ventricular diastolic parameters are indeterminate. Elevated left ventricular end-diastolic pressure. Right Ventricle: The right ventricular size is normal. No increase in right ventricular wall thickness. Right ventricular systolic function is normal. Tricuspid regurgitation signal is inadequate for assessing PA pressure. Left Atrium: Left atrial size was normal in size. Right Atrium: Right atrial size was normal in size. Pericardium: There is no evidence of pericardial effusion. Mitral Valve: The mitral valve is normal in structure. Trivial mitral valve regurgitation. No evidence of mitral valve stenosis. Tricuspid Valve: The tricuspid valve is normal in structure. Tricuspid valve regurgitation is trivial. No  evidence of tricuspid stenosis. Aortic Valve: The aortic valve is tricuspid. Aortic valve regurgitation is trivial. Aortic valve sclerosis is present, with no evidence of aortic valve stenosis. Aortic valve mean gradient measures 4.0 mmHg. Aortic valve peak  ALT 11  ALKPHOS 92  BILITOT 1.4*  PROT 6.4*  ALBUMIN 3.1*   CBG: Recent Labs  Lab 03/31/23 1801  GLUCAP 126*    Radiology Studies: ECHOCARDIOGRAM COMPLETE  Result Date: 04/01/2023    ECHOCARDIOGRAM REPORT   Patient Name:   [name] [facility] Date of Exam: 04/01/2023 Medical Rec #:  [id number]        Height:       67.0 in Accession #:    [id number]       Weight:       139.1 lb Date of Birth:  March 23, 1922         BSA:          1.733 m Patient Age:    89 years         BP:           176/91 mmHg Patient Gender: F                HR:           100 bpm. Exam Location:  Inpatient Procedure: 2D Echo, Cardiac Doppler and Color Doppler Indications:    Atrial Fibrillation  History:        Patient has prior history of Echocardiogram examinations, most                 recent 08/04/2014. Risk Factors:Hypertension.  Sonographer:    Karma Ganja Referring Phys: [id number] SUBRINA SUNDIL  Sonographer Comments: Image acquisition challenging due to patient body habitus. IMPRESSIONS  1. Left ventricular ejection fraction, by estimation, is 60 to 65%. The left ventricle has normal function.  The left ventricle has no regional wall motion abnormalities. There is moderate concentric left ventricular hypertrophy. Left ventricular diastolic parameters are indeterminate. Elevated left ventricular end-diastolic pressure.  2. Right ventricular systolic function is normal. The right ventricular size is normal. Tricuspid regurgitation signal is inadequate for assessing PA pressure.  3. The mitral valve is normal in structure. Trivial mitral valve regurgitation. No evidence of mitral stenosis.  4. The aortic valve is tricuspid. Aortic valve regurgitation is trivial. Aortic valve sclerosis is present, with no evidence of aortic valve stenosis. Aortic valve area, by VTI measures 2.58 cm. Aortic valve mean gradient measures 4.0 mmHg. Aortic valve Vmax measures 1.29 m/s.  5. The inferior vena cava is normal in size with <50% respiratory variability, suggesting right atrial pressure of 8 mmHg. FINDINGS  Left Ventricle: Left ventricular ejection fraction, by estimation, is 60 to 65%. The left ventricle has normal function. The left ventricle has no regional wall motion abnormalities. The left ventricular internal cavity size was normal in size. There is  moderate concentric left ventricular hypertrophy. Left ventricular diastolic parameters are indeterminate. Elevated left ventricular end-diastolic pressure. Right Ventricle: The right ventricular size is normal. No increase in right ventricular wall thickness. Right ventricular systolic function is normal. Tricuspid regurgitation signal is inadequate for assessing PA pressure. Left Atrium: Left atrial size was normal in size. Right Atrium: Right atrial size was normal in size. Pericardium: There is no evidence of pericardial effusion. Mitral Valve: The mitral valve is normal in structure. Trivial mitral valve regurgitation. No evidence of mitral valve stenosis. Tricuspid Valve: The tricuspid valve is normal in structure. Tricuspid valve regurgitation is trivial. No  evidence of tricuspid stenosis. Aortic Valve: The aortic valve is tricuspid. Aortic valve regurgitation is trivial. Aortic valve sclerosis is present, with no evidence of aortic valve stenosis. Aortic valve mean gradient measures 4.0 mmHg. Aortic valve peak  PROGRESS NOTE    [name]  ZOX:[id number] DOB: 1921/07/15 DOA: 03/31/2023 PCP: [name], MD  Subjective: Pt seen and examined. Met with pt's dtr and grand-dtr at bedside. Grand dtr states pt is confused. Pt lives with dtr but dtr does not state that pt is confused.  Dtr wants pt to go to SNF at discharge.    Hospital Course: HPI: [name] is a 89 y.o. female with medical history significant of hypertension, aortic valve regurgitation, paroxysmal SVT on metoprolol, prior GI bleed, breast cancer, CKD 3A, and GERD presented to emergency department for the evaluation for generalized weakness.  Patient's son at the bedside reported that patient has increased generalized weakness making it hard for the patient to ambulate.  She is able to ambulate at the baseline with her own with the help of a walker.  Patient son reported currently patient taking MiraLAX for constipation but recently having multiple episodes of loose stool during the last 1 week.  Patient does not have any evidence of GI bleed.  Family also voicing concern for increasing confusion.  Patient does not have any fall, chest pain, palpitation, shortness of breath, nausea, vomiting, fever and chills. History is very limited from the patient due to underlying dementia.  Patient is alert but not completely oriented to self, place and person.  She is pleasantly confused.   Per chart patient was admitted in May 2023 for evaluation for GI bleed found to have GI bleed secondary to diverticular origin.  GI deferred EGD and colonoscopy due to advanced age.  She also had history of paroxysmal SVT on Toprol chronically.  Found out that she had chronic generalized weakness and deconditioning and seen by PT and recommendation home health with home PT.     ED Course:  At presentation to ED patient heart rate 88, respiratory 18, blood pressure 150/75 and O2 sat 100% room air. POC blood glucose 126 WNL.  BMP unremarkable  except slightly low bicarb 20.  CBC grossly unremarkable.  UA unremarkable. High sensitive troponin in process.    EKG showed atrial fibrillation, premature ventricular complex and heart rate 99.  Chest x-ray mild cardiomegaly and aortic atherosclerosis. As EKG atrial fibrillation in the ED patient has been given Lopressor 50 mg 1 dose.  Patient also had some agitation and got Zyprexa with has been calm patient down.  Physical exam patient looked dehydrated however no evidence of acute kidney injury.  Received 1 L of LR bolus in the ED. Dr. Manus Gunning is mostly concerned about generalized weakness likely development of in the setting of atrial fibrillation and requesting admission for managing of general weakness.  Significant Events: Admitted 03/31/2023 for weakness   Significant Labs:   Significant Imaging Studies: Admitting Cxr negative for pneumonia  Antibiotic Therapy: Anti-infectives (From admission, onward)    None       Procedures:   Consultants:     Assessment and Plan: * Generalized weakness 04-01-2023 grand-dtr states pt is acting differently. Pt appears chronically demented to me. Grand-dtr states pt can use fork to eat. But pt trying to eat with fork and keeps missing her mouth. Discussed that CT head could potentially show old/sub acute infarct. But at pt's age of 89 yo, most aggressive therapies for CVA would be contraindicated for her. Will obtain CT head to evaluate.  Dtr wants pt to go to SNF.  Paroxysmal atrial fibrillation (HCC) 03-31-2023 discussed with pt's grand-dtr and dtr that risk of systemic anticoagulation in this  PROGRESS NOTE    [name]  ZOX:[id number] DOB: 1921/07/15 DOA: 03/31/2023 PCP: [name], MD  Subjective: Pt seen and examined. Met with pt's dtr and grand-dtr at bedside. Grand dtr states pt is confused. Pt lives with dtr but dtr does not state that pt is confused.  Dtr wants pt to go to SNF at discharge.    Hospital Course: HPI: [name] is a 89 y.o. female with medical history significant of hypertension, aortic valve regurgitation, paroxysmal SVT on metoprolol, prior GI bleed, breast cancer, CKD 3A, and GERD presented to emergency department for the evaluation for generalized weakness.  Patient's son at the bedside reported that patient has increased generalized weakness making it hard for the patient to ambulate.  She is able to ambulate at the baseline with her own with the help of a walker.  Patient son reported currently patient taking MiraLAX for constipation but recently having multiple episodes of loose stool during the last 1 week.  Patient does not have any evidence of GI bleed.  Family also voicing concern for increasing confusion.  Patient does not have any fall, chest pain, palpitation, shortness of breath, nausea, vomiting, fever and chills. History is very limited from the patient due to underlying dementia.  Patient is alert but not completely oriented to self, place and person.  She is pleasantly confused.   Per chart patient was admitted in May 2023 for evaluation for GI bleed found to have GI bleed secondary to diverticular origin.  GI deferred EGD and colonoscopy due to advanced age.  She also had history of paroxysmal SVT on Toprol chronically.  Found out that she had chronic generalized weakness and deconditioning and seen by PT and recommendation home health with home PT.     ED Course:  At presentation to ED patient heart rate 88, respiratory 18, blood pressure 150/75 and O2 sat 100% room air. POC blood glucose 126 WNL.  BMP unremarkable  except slightly low bicarb 20.  CBC grossly unremarkable.  UA unremarkable. High sensitive troponin in process.    EKG showed atrial fibrillation, premature ventricular complex and heart rate 99.  Chest x-ray mild cardiomegaly and aortic atherosclerosis. As EKG atrial fibrillation in the ED patient has been given Lopressor 50 mg 1 dose.  Patient also had some agitation and got Zyprexa with has been calm patient down.  Physical exam patient looked dehydrated however no evidence of acute kidney injury.  Received 1 L of LR bolus in the ED. Dr. Manus Gunning is mostly concerned about generalized weakness likely development of in the setting of atrial fibrillation and requesting admission for managing of general weakness.  Significant Events: Admitted 03/31/2023 for weakness   Significant Labs:   Significant Imaging Studies: Admitting Cxr negative for pneumonia  Antibiotic Therapy: Anti-infectives (From admission, onward)    None       Procedures:   Consultants:     Assessment and Plan: * Generalized weakness 04-01-2023 grand-dtr states pt is acting differently. Pt appears chronically demented to me. Grand-dtr states pt can use fork to eat. But pt trying to eat with fork and keeps missing her mouth. Discussed that CT head could potentially show old/sub acute infarct. But at pt's age of 89 yo, most aggressive therapies for CVA would be contraindicated for her. Will obtain CT head to evaluate.  Dtr wants pt to go to SNF.  Paroxysmal atrial fibrillation (HCC) 03-31-2023 discussed with pt's grand-dtr and dtr that risk of systemic anticoagulation in this  PROGRESS NOTE    [name]  ZOX:[id number] DOB: 1921/07/15 DOA: 03/31/2023 PCP: [name], MD  Subjective: Pt seen and examined. Met with pt's dtr and grand-dtr at bedside. Grand dtr states pt is confused. Pt lives with dtr but dtr does not state that pt is confused.  Dtr wants pt to go to SNF at discharge.    Hospital Course: HPI: [name] is a 89 y.o. female with medical history significant of hypertension, aortic valve regurgitation, paroxysmal SVT on metoprolol, prior GI bleed, breast cancer, CKD 3A, and GERD presented to emergency department for the evaluation for generalized weakness.  Patient's son at the bedside reported that patient has increased generalized weakness making it hard for the patient to ambulate.  She is able to ambulate at the baseline with her own with the help of a walker.  Patient son reported currently patient taking MiraLAX for constipation but recently having multiple episodes of loose stool during the last 1 week.  Patient does not have any evidence of GI bleed.  Family also voicing concern for increasing confusion.  Patient does not have any fall, chest pain, palpitation, shortness of breath, nausea, vomiting, fever and chills. History is very limited from the patient due to underlying dementia.  Patient is alert but not completely oriented to self, place and person.  She is pleasantly confused.   Per chart patient was admitted in May 2023 for evaluation for GI bleed found to have GI bleed secondary to diverticular origin.  GI deferred EGD and colonoscopy due to advanced age.  She also had history of paroxysmal SVT on Toprol chronically.  Found out that she had chronic generalized weakness and deconditioning and seen by PT and recommendation home health with home PT.     ED Course:  At presentation to ED patient heart rate 88, respiratory 18, blood pressure 150/75 and O2 sat 100% room air. POC blood glucose 126 WNL.  BMP unremarkable  except slightly low bicarb 20.  CBC grossly unremarkable.  UA unremarkable. High sensitive troponin in process.    EKG showed atrial fibrillation, premature ventricular complex and heart rate 99.  Chest x-ray mild cardiomegaly and aortic atherosclerosis. As EKG atrial fibrillation in the ED patient has been given Lopressor 50 mg 1 dose.  Patient also had some agitation and got Zyprexa with has been calm patient down.  Physical exam patient looked dehydrated however no evidence of acute kidney injury.  Received 1 L of LR bolus in the ED. Dr. Manus Gunning is mostly concerned about generalized weakness likely development of in the setting of atrial fibrillation and requesting admission for managing of general weakness.  Significant Events: Admitted 03/31/2023 for weakness   Significant Labs:   Significant Imaging Studies: Admitting Cxr negative for pneumonia  Antibiotic Therapy: Anti-infectives (From admission, onward)    None       Procedures:   Consultants:     Assessment and Plan: * Generalized weakness 04-01-2023 grand-dtr states pt is acting differently. Pt appears chronically demented to me. Grand-dtr states pt can use fork to eat. But pt trying to eat with fork and keeps missing her mouth. Discussed that CT head could potentially show old/sub acute infarct. But at pt's age of 89 yo, most aggressive therapies for CVA would be contraindicated for her. Will obtain CT head to evaluate.  Dtr wants pt to go to SNF.  Paroxysmal atrial fibrillation (HCC) 03-31-2023 discussed with pt's grand-dtr and dtr that risk of systemic anticoagulation in this

## 2023-04-01 NOTE — Assessment & Plan Note (Addendum)
03-31-2023 discussed with pt's grand-dtr and dtr that risk of systemic anticoagulation in this 89 yo patient outweigh any potential benefits of CVA prophylaxis. Pt also with hx of GI bleeding. Shared decision making. Decided that systemic anticoagulation was not in pt's best interest and will not be started.  Echos shows LVEF 65%. No shunts

## 2023-04-01 NOTE — Progress Notes (Signed)
Echocardiogram 2D Echocardiogram has been performed.  [name] 04/01/2023, 8:49 AM

## 2023-04-01 NOTE — Progress Notes (Signed)
OT Cancellation Note  [name] MRN: [id number] DOB: [birth year]   Cancelled Treatment:    Reason Eval/Treat Not Completed: Other (comment) (Per pt's family pt enjoys sitting in recliner; OT to return after lunch for evaluation and to assess transfer back to bed.)  [name] 04/01/2023, 10:15 AM

## 2023-04-01 NOTE — Assessment & Plan Note (Signed)
Continue protonix 40mg daily.  

## 2023-04-01 NOTE — Progress Notes (Signed)
04/01/2023 1930 When giving bed side report, the 2 sons in room requested to make the pt a DNR.  Will notify MD. Kathryne Hitch

## 2023-04-01 NOTE — ED Notes (Signed)
Changed and repositioned patient.

## 2023-04-01 NOTE — Plan of Care (Signed)

## 2023-04-01 NOTE — Assessment & Plan Note (Signed)
04-01-2023 grand-dtr states pt is acting differently. Pt appears chronically demented to me. Grand-dtr states pt can use fork to eat. But pt trying to eat with fork and keeps missing her mouth. Discussed that CT head could potentially show old/sub acute infarct. But at pt's age of 89 yo, most aggressive therapies for CVA would be contraindicated for her. Will obtain CT head to evaluate.  Dtr wants pt to go to SNF.

## 2023-04-01 NOTE — Progress Notes (Signed)
Patient just had a 10 beats run of V-Tach. Patient is asymptomatic and VSS. MD aware. No new order received at this time.

## 2023-04-01 NOTE — ED Notes (Signed)
Patient refusing to ambulate with staff. Provider made aware. Patient to be admitted.

## 2023-04-01 NOTE — Assessment & Plan Note (Signed)
Continue lopressor 50 mg bid, lisinopril 40 mg daily, norvasc 10 mg daily

## 2023-04-01 NOTE — Assessment & Plan Note (Signed)
Pt likely has age-related dementia/vascular dementia. CT head today to evaluate.

## 2023-04-01 NOTE — Significant Event (Signed)
Messaged by RN: "At change of shift son in room asking to change her code status to DNR. Pt is in with weakness and diarrhea."  DNR status absolutely seems most reasonable in this 89 year old lady.  Looks like pt DNR on previous admissions in 2019 as well.  Will make pt DNR.

## 2023-04-01 NOTE — Progress Notes (Signed)
Patient to floor from ED. Patient alert to self only, no skin issues, incontinent of urine, Patient needing constant supervision and very frequent reorienting. Patient will need a safety sitter to allow IV fluids to infuse and to prevent patient from falling.

## 2023-04-01 NOTE — Assessment & Plan Note (Signed)
Chronic. Continue lopressor.

## 2023-04-02 DIAGNOSIS — R531 Weakness: Secondary | ICD-10-CM | POA: Diagnosis not present

## 2023-04-02 LAB — T4, FREE: Free T4: 1.11 ng/dL (ref 0.61–1.12)

## 2023-04-02 LAB — TSH: TSH: 2.186 u[IU]/mL (ref 0.350–4.500)

## 2023-04-02 MED ORDER — OLANZAPINE 2.5 MG PO TABS: 2.5000 mg | ORAL_TABLET | Freq: Every day | ORAL | Status: DC

## 2023-04-02 MED ORDER — LISINOPRIL 20 MG PO TABS
20.0000 mg | ORAL_TABLET | Freq: Every day | ORAL | Status: DC
  Administered 2023-04-03: 20 mg via ORAL
  Filled 2023-04-02: qty 1

## 2023-04-02 NOTE — Plan of Care (Signed)

## 2023-04-02 NOTE — Discharge Summary (Deleted)
Lab 03/31/23 1804 04/01/23 0225  NA 137 138  K 4.2 4.5  CL 105 108  CO2 20* 21*  GLUCOSE 119* 96  BUN 14 10  CREATININE 0.99 0.72  CALCIUM 9.0 8.4*   GFR: Estimated Creatinine Clearance: 37.3 mL/min (by C-G formula based on SCr of 0.72 mg/dL). Liver Function Tests: Recent Labs  Lab 04/01/23 0225  AST 25  ALT 11   ALKPHOS 92  BILITOT 1.4*  PROT 6.4*  ALBUMIN 3.1*   No results for input(s): "LIPASE", "AMYLASE" in the last 168 hours. No results for input(s): "AMMONIA" in the last 168 hours. Coagulation Profile: No results for input(s): "INR", "PROTIME" in the last 168 hours. Cardiac Enzymes: No results for input(s): "CKTOTAL", "CKMB", "CKMBINDEX", "TROPONINI" in the last 168 hours. BNP (last 3 results) No results for input(s): "PROBNP" in the last 8760 hours. HbA1C: No results for input(s): "HGBA1C" in the last 72 hours. CBG: Recent Labs  Lab 03/31/23 1801  GLUCAP 126*   Lipid Profile: No results for input(s): "CHOL", "HDL", "LDLCALC", "TRIG", "CHOLHDL", "LDLDIRECT" in the last 72 hours. Thyroid Function Tests: Recent Labs    04/02/23 0811  TSH 2.186  FREET4 1.11   Anemia Panel: No results for input(s): "VITAMINB12", "FOLATE", "FERRITIN", "TIBC", "IRON", "RETICCTPCT" in the last 72 hours. Urine analysis:    Component Value Date/Time   COLORURINE YELLOW 03/31/2023 2055   APPEARANCEUR CLEAR 03/31/2023 2055   LABSPEC 1.011 03/31/2023 2055   PHURINE 6.0 03/31/2023 2055   GLUCOSEU NEGATIVE 03/31/2023 2055   GLUCOSEU NEGATIVE 12/05/2016 1533   HGBUR SMALL (A) 03/31/2023 2055   BILIRUBINUR NEGATIVE 03/31/2023 2055   BILIRUBINUR Neg 12/08/2017 1434   KETONESUR NEGATIVE 03/31/2023 2055   PROTEINUR NEGATIVE 03/31/2023 2055   UROBILINOGEN 0.2 12/08/2017 1434   UROBILINOGEN 1.0 12/05/2016 1533   NITRITE NEGATIVE 03/31/2023 2055   LEUKOCYTESUR NEGATIVE 03/31/2023 2055   Sepsis Labs: @LABRCNTIP (procalcitonin:4,lacticidven:4)  )No results found for this or any previous visit (from the past 240 hour(s)).   Radiology Studies: CT HEAD WO CONTRAST ( )  Result Date: 04/01/2023 CLINICAL DATA:  Mental status change, stroke suspected EXAM: CT HEAD WITHOUT CONTRAST TECHNIQUE: Contiguous axial images were obtained from the base of the skull through the vertex without intravenous contrast.  RADIATION DOSE REDUCTION: This exam was performed according to the departmental dose-optimization program which includes automated exposure control, adjustment of the mA and/or kV according to patient size and/or use of iterative reconstruction technique. COMPARISON:  02/13/2005 CT head FINDINGS: Brain: No evidence of acute infarction, hemorrhage, mass, mass effect, or midline shift. No hydrocephalus or extra-axial fluid collection. Periventricular white matter changes, likely the sequela of chronic small vessel ischemic disease. Age related cerebral volume loss. Vascular: No hyperdense vessel. Atherosclerotic calcifications in the intracranial carotid and vertebral arteries. Skull: Negative for fracture or focal lesion. Sinuses/Orbits: No acute finding. Other: The mastoid air cells are well aerated. IMPRESSION: No acute intracranial process. Electronically Signed   By: Wiliam Ke M.D.   On: 04/01/2023 16:43   ECHOCARDIOGRAM COMPLETE  Result Date: 04/01/2023    ECHOCARDIOGRAM REPORT   Patient Name:   [name] [facility] Date of Exam: 04/01/2023 Medical Rec #:  [id number]        Height:       67.0 in Accession #:    [id number]       Weight:       139.1 lb Date of Birth:  08-06-1921         BSA:  PROGRESS NOTE    ANAUM SGROI  RUE:[id number] DOB: 12/04/1921 DOA: 03/31/2023 PCP: [name], MD   89/F w hypertension, aortic valve regurgitation, paroxysmal SVT on metoprolol, prior GI bleed, breast cancer, CKD 3A, and GERD presented to emergency department for the evaluation for generalized weakness, difficulties with ambulation, recently started on MiraLAX for constipation and had multiple loose stools following this, family also noticed mild confusion recently. -In the ED vital signs were stable, labs largely unremarkable, UA benign, chest x-ray with mild cardiomegaly, noted to be in A-fib thereafter converted to sinus rhythm -Noted to be delirious and agitated in the ER, admitted, given Zyprexa and a liter of fluids  Subjective: Patient seen and examined, Dr. Nicky Pugh notes reviewed from yesterday, she denies any diarrhea to me today, denies any complaints, sitting up eating breakfast  Assessment and Plan:   Generalized weakness -No acute trigger identified at this time, recent diarrhea after MiraLAX use last week has resolved -Neuroexam is nonfocal, CT head was unremarkable -Labs urinalysis chest x-ray was benign as well -Check TSH and free T4 -Family wants patient to go to SNF for short-term rehab, TOC following, medically stable  Paroxysmal atrial fibrillation (HCC) -Unclear if this is a new diagnosis, noted to be in A-fib in the ER, shortly thereafter converted to sinus rhythm  -I agree with my partners regarding avoiding anticoagulation in this elderly frail 89 year old with prior history of GI bleeds -Continue metoprolol  Hospital delirium Pt likely has age-related dementia/vascular dementia.  CT head unremarkable -Labs UA benign, started on low-dose Zyprexa on admission, continue  Elevated troponin Likely due to CKD and not ACS.  CKD stage 3a, GFR 45-59 ml/min (HCC) Stable.  Age-related physical debility Chronic.  SVT (supraventricular tachycardia)  (HCC) Chronic. Continue lopressor.  History of GI bleed Stable  GERD (gastroesophageal reflux disease) Continue protonix 40 mg daily.  Essential hypertension Continue lopressor 50 mg bid, norvasc 10 mg daily   DVT prophylaxis: Lovenox Code Status: DNR are Family Communication: No family at bedside Disposition Plan: SNF when bed available  Consultants:    Procedures:   Antimicrobials:    Objective: Vitals:   04/02/23 0028 04/02/23 0400 04/02/23 0529 04/02/23 0740  BP: 121/68  (!) 153/76 (!) 146/68  Pulse: 63  76 65  Resp: 16  18 17   Temp: 97.8 F (36.6 C)  98.2 F (36.8 C) (!) 97.4 F (36.3 C)  TempSrc: Axillary  Axillary Oral  SpO2: 90%  97% 100%  Weight:  62 kg    Height:        Intake/Output Summary (Last 24 hours) at 04/02/2023 1219 Last data filed at 04/02/2023 0850 Gross per 24 hour  Intake 83 ml  Output --  Net 83 ml   Filed Weights   03/31/23 1753 04/01/23 0353 04/02/23 0400  Weight: 68 kg 63.1 kg 62 kg    Examination:  General exam: Elderly female laying in bed appears calm and comfortable, AAO x 2, mild cognitive deficits Respiratory system: Clear to auscultation Cardiovascular system: S1 & S2 heard, RRR.  Abd: nondistended, soft and nontender.Normal bowel sounds heard. Central nervous system: Alert and oriented. No focal neurological deficits. Extremities: no edema Skin: No rashes Psychiatry:  Mood & affect appropriate.     Data Reviewed:   CBC: Recent Labs  Lab 03/31/23 1804 04/01/23 0225  WBC 9.3 7.8  HGB 12.7 12.2  HCT 40.9 38.8  MCV 83.1 82.7  PLT 333 318   Basic Metabolic Panel: Recent Labs  Lab 03/31/23 1804 04/01/23 0225  NA 137 138  K 4.2 4.5  CL 105 108  CO2 20* 21*  GLUCOSE 119* 96  BUN 14 10  CREATININE 0.99 0.72  CALCIUM 9.0 8.4*   GFR: Estimated Creatinine Clearance: 37.3 mL/min (by C-G formula based on SCr of 0.72 mg/dL). Liver Function Tests: Recent Labs  Lab 04/01/23 0225  AST 25  ALT 11   ALKPHOS 92  BILITOT 1.4*  PROT 6.4*  ALBUMIN 3.1*   No results for input(s): "LIPASE", "AMYLASE" in the last 168 hours. No results for input(s): "AMMONIA" in the last 168 hours. Coagulation Profile: No results for input(s): "INR", "PROTIME" in the last 168 hours. Cardiac Enzymes: No results for input(s): "CKTOTAL", "CKMB", "CKMBINDEX", "TROPONINI" in the last 168 hours. BNP (last 3 results) No results for input(s): "PROBNP" in the last 8760 hours. HbA1C: No results for input(s): "HGBA1C" in the last 72 hours. CBG: Recent Labs  Lab 03/31/23 1801  GLUCAP 126*   Lipid Profile: No results for input(s): "CHOL", "HDL", "LDLCALC", "TRIG", "CHOLHDL", "LDLDIRECT" in the last 72 hours. Thyroid Function Tests: Recent Labs    04/02/23 0811  TSH 2.186  FREET4 1.11   Anemia Panel: No results for input(s): "VITAMINB12", "FOLATE", "FERRITIN", "TIBC", "IRON", "RETICCTPCT" in the last 72 hours. Urine analysis:    Component Value Date/Time   COLORURINE YELLOW 03/31/2023 2055   APPEARANCEUR CLEAR 03/31/2023 2055   LABSPEC 1.011 03/31/2023 2055   PHURINE 6.0 03/31/2023 2055   GLUCOSEU NEGATIVE 03/31/2023 2055   GLUCOSEU NEGATIVE 12/05/2016 1533   HGBUR SMALL (A) 03/31/2023 2055   BILIRUBINUR NEGATIVE 03/31/2023 2055   BILIRUBINUR Neg 12/08/2017 1434   KETONESUR NEGATIVE 03/31/2023 2055   PROTEINUR NEGATIVE 03/31/2023 2055   UROBILINOGEN 0.2 12/08/2017 1434   UROBILINOGEN 1.0 12/05/2016 1533   NITRITE NEGATIVE 03/31/2023 2055   LEUKOCYTESUR NEGATIVE 03/31/2023 2055   Sepsis Labs: @LABRCNTIP (procalcitonin:4,lacticidven:4)  )No results found for this or any previous visit (from the past 240 hour(s)).   Radiology Studies: CT HEAD WO CONTRAST ( )  Result Date: 04/01/2023 CLINICAL DATA:  Mental status change, stroke suspected EXAM: CT HEAD WITHOUT CONTRAST TECHNIQUE: Contiguous axial images were obtained from the base of the skull through the vertex without intravenous contrast.  RADIATION DOSE REDUCTION: This exam was performed according to the departmental dose-optimization program which includes automated exposure control, adjustment of the mA and/or kV according to patient size and/or use of iterative reconstruction technique. COMPARISON:  02/13/2005 CT head FINDINGS: Brain: No evidence of acute infarction, hemorrhage, mass, mass effect, or midline shift. No hydrocephalus or extra-axial fluid collection. Periventricular white matter changes, likely the sequela of chronic small vessel ischemic disease. Age related cerebral volume loss. Vascular: No hyperdense vessel. Atherosclerotic calcifications in the intracranial carotid and vertebral arteries. Skull: Negative for fracture or focal lesion. Sinuses/Orbits: No acute finding. Other: The mastoid air cells are well aerated. IMPRESSION: No acute intracranial process. Electronically Signed   By: Wiliam Ke M.D.   On: 04/01/2023 16:43   ECHOCARDIOGRAM COMPLETE  Result Date: 04/01/2023    ECHOCARDIOGRAM REPORT   Patient Name:   [name] [facility] Date of Exam: 04/01/2023 Medical Rec #:  [id number]        Height:       67.0 in Accession #:    [id number]       Weight:       139.1 lb Date of Birth:  08-06-1921         BSA:  PROGRESS NOTE    ANAUM SGROI  RUE:[id number] DOB: 12/04/1921 DOA: 03/31/2023 PCP: [name], MD   89/F w hypertension, aortic valve regurgitation, paroxysmal SVT on metoprolol, prior GI bleed, breast cancer, CKD 3A, and GERD presented to emergency department for the evaluation for generalized weakness, difficulties with ambulation, recently started on MiraLAX for constipation and had multiple loose stools following this, family also noticed mild confusion recently. -In the ED vital signs were stable, labs largely unremarkable, UA benign, chest x-ray with mild cardiomegaly, noted to be in A-fib thereafter converted to sinus rhythm -Noted to be delirious and agitated in the ER, admitted, given Zyprexa and a liter of fluids  Subjective: Patient seen and examined, Dr. Nicky Pugh notes reviewed from yesterday, she denies any diarrhea to me today, denies any complaints, sitting up eating breakfast  Assessment and Plan:   Generalized weakness -No acute trigger identified at this time, recent diarrhea after MiraLAX use last week has resolved -Neuroexam is nonfocal, CT head was unremarkable -Labs urinalysis chest x-ray was benign as well -Check TSH and free T4 -Family wants patient to go to SNF for short-term rehab, TOC following, medically stable  Paroxysmal atrial fibrillation (HCC) -Unclear if this is a new diagnosis, noted to be in A-fib in the ER, shortly thereafter converted to sinus rhythm  -I agree with my partners regarding avoiding anticoagulation in this elderly frail 89 year old with prior history of GI bleeds -Continue metoprolol  Hospital delirium Pt likely has age-related dementia/vascular dementia.  CT head unremarkable -Labs UA benign, started on low-dose Zyprexa on admission, continue  Elevated troponin Likely due to CKD and not ACS.  CKD stage 3a, GFR 45-59 ml/min (HCC) Stable.  Age-related physical debility Chronic.  SVT (supraventricular tachycardia)  (HCC) Chronic. Continue lopressor.  History of GI bleed Stable  GERD (gastroesophageal reflux disease) Continue protonix 40 mg daily.  Essential hypertension Continue lopressor 50 mg bid, norvasc 10 mg daily   DVT prophylaxis: Lovenox Code Status: DNR are Family Communication: No family at bedside Disposition Plan: SNF when bed available  Consultants:    Procedures:   Antimicrobials:    Objective: Vitals:   04/02/23 0028 04/02/23 0400 04/02/23 0529 04/02/23 0740  BP: 121/68  (!) 153/76 (!) 146/68  Pulse: 63  76 65  Resp: 16  18 17   Temp: 97.8 F (36.6 C)  98.2 F (36.8 C) (!) 97.4 F (36.3 C)  TempSrc: Axillary  Axillary Oral  SpO2: 90%  97% 100%  Weight:  62 kg    Height:        Intake/Output Summary (Last 24 hours) at 04/02/2023 1219 Last data filed at 04/02/2023 0850 Gross per 24 hour  Intake 83 ml  Output --  Net 83 ml   Filed Weights   03/31/23 1753 04/01/23 0353 04/02/23 0400  Weight: 68 kg 63.1 kg 62 kg    Examination:  General exam: Elderly female laying in bed appears calm and comfortable, AAO x 2, mild cognitive deficits Respiratory system: Clear to auscultation Cardiovascular system: S1 & S2 heard, RRR.  Abd: nondistended, soft and nontender.Normal bowel sounds heard. Central nervous system: Alert and oriented. No focal neurological deficits. Extremities: no edema Skin: No rashes Psychiatry:  Mood & affect appropriate.     Data Reviewed:   CBC: Recent Labs  Lab 03/31/23 1804 04/01/23 0225  WBC 9.3 7.8  HGB 12.7 12.2  HCT 40.9 38.8  MCV 83.1 82.7  PLT 333 318   Basic Metabolic Panel: Recent Labs  Lab 03/31/23 1804 04/01/23 0225  NA 137 138  K 4.2 4.5  CL 105 108  CO2 20* 21*  GLUCOSE 119* 96  BUN 14 10  CREATININE 0.99 0.72  CALCIUM 9.0 8.4*   GFR: Estimated Creatinine Clearance: 37.3 mL/min (by C-G formula based on SCr of 0.72 mg/dL). Liver Function Tests: Recent Labs  Lab 04/01/23 0225  AST 25  ALT 11   ALKPHOS 92  BILITOT 1.4*  PROT 6.4*  ALBUMIN 3.1*   No results for input(s): "LIPASE", "AMYLASE" in the last 168 hours. No results for input(s): "AMMONIA" in the last 168 hours. Coagulation Profile: No results for input(s): "INR", "PROTIME" in the last 168 hours. Cardiac Enzymes: No results for input(s): "CKTOTAL", "CKMB", "CKMBINDEX", "TROPONINI" in the last 168 hours. BNP (last 3 results) No results for input(s): "PROBNP" in the last 8760 hours. HbA1C: No results for input(s): "HGBA1C" in the last 72 hours. CBG: Recent Labs  Lab 03/31/23 1801  GLUCAP 126*   Lipid Profile: No results for input(s): "CHOL", "HDL", "LDLCALC", "TRIG", "CHOLHDL", "LDLDIRECT" in the last 72 hours. Thyroid Function Tests: Recent Labs    04/02/23 0811  TSH 2.186  FREET4 1.11   Anemia Panel: No results for input(s): "VITAMINB12", "FOLATE", "FERRITIN", "TIBC", "IRON", "RETICCTPCT" in the last 72 hours. Urine analysis:    Component Value Date/Time   COLORURINE YELLOW 03/31/2023 2055   APPEARANCEUR CLEAR 03/31/2023 2055   LABSPEC 1.011 03/31/2023 2055   PHURINE 6.0 03/31/2023 2055   GLUCOSEU NEGATIVE 03/31/2023 2055   GLUCOSEU NEGATIVE 12/05/2016 1533   HGBUR SMALL (A) 03/31/2023 2055   BILIRUBINUR NEGATIVE 03/31/2023 2055   BILIRUBINUR Neg 12/08/2017 1434   KETONESUR NEGATIVE 03/31/2023 2055   PROTEINUR NEGATIVE 03/31/2023 2055   UROBILINOGEN 0.2 12/08/2017 1434   UROBILINOGEN 1.0 12/05/2016 1533   NITRITE NEGATIVE 03/31/2023 2055   LEUKOCYTESUR NEGATIVE 03/31/2023 2055   Sepsis Labs: @LABRCNTIP (procalcitonin:4,lacticidven:4)  )No results found for this or any previous visit (from the past 240 hour(s)).   Radiology Studies: CT HEAD WO CONTRAST ( )  Result Date: 04/01/2023 CLINICAL DATA:  Mental status change, stroke suspected EXAM: CT HEAD WITHOUT CONTRAST TECHNIQUE: Contiguous axial images were obtained from the base of the skull through the vertex without intravenous contrast.  RADIATION DOSE REDUCTION: This exam was performed according to the departmental dose-optimization program which includes automated exposure control, adjustment of the mA and/or kV according to patient size and/or use of iterative reconstruction technique. COMPARISON:  02/13/2005 CT head FINDINGS: Brain: No evidence of acute infarction, hemorrhage, mass, mass effect, or midline shift. No hydrocephalus or extra-axial fluid collection. Periventricular white matter changes, likely the sequela of chronic small vessel ischemic disease. Age related cerebral volume loss. Vascular: No hyperdense vessel. Atherosclerotic calcifications in the intracranial carotid and vertebral arteries. Skull: Negative for fracture or focal lesion. Sinuses/Orbits: No acute finding. Other: The mastoid air cells are well aerated. IMPRESSION: No acute intracranial process. Electronically Signed   By: Wiliam Ke M.D.   On: 04/01/2023 16:43   ECHOCARDIOGRAM COMPLETE  Result Date: 04/01/2023    ECHOCARDIOGRAM REPORT   Patient Name:   [name] [facility] Date of Exam: 04/01/2023 Medical Rec #:  [id number]        Height:       67.0 in Accession #:    [id number]       Weight:       139.1 lb Date of Birth:  08-06-1921         BSA:

## 2023-04-02 NOTE — TOC Initial Note (Addendum)
Transition of Care [facility]) - Initial/Assessment Note    Patient Details  Name: [name] MRN: [id number] Date of Birth: 01/08/1922  [facility]) CM/SW Contact:    [name], LCSWA Phone Number: 04/02/2023, 10:50 AM  Clinical Narrative:                  Update 1604 CSW met with pt and pt's daughter, [name], at bedside. She said that she needed more time to decide bed, wants to speak with other family members. She said she would reach out to CSW with decision. CSW confirmed pt is eligible for [facility] waiver.   Update 1424 CSW met with pt and pt's family member at bedside to provide bed offers. They stated they would discuss and make a decision on a facility and let CSW know. CSW contact information provided.   TOC will continue to follow.    CSW met with pt and pt's daughter and son at bedside. Pt is agreeable to SNF, completed workup. Sent email to [facility] to confirm if pt is [name] waiver eligible. SNF workup completed, sent to eligible facilities in event pt can go to SNF at discharge.   TOC will continue to follow.  Expected Discharge Plan: Skilled Nursing Facility Barriers to Discharge: Continued Medical Work up, SNF Pending bed offer   Patient Goals and CMS Choice            Expected Discharge Plan and Services       Living arrangements for the past 2 months: Single Family Home                                      Prior Living Arrangements/Services Living arrangements for the past 2 months: Single Family Home Lives with:: Adult Children          Need for Family Participation in Patient Care: Yes (Comment) Care giver support system in place?: Yes (comment)      Activities of Daily Living      Permission Sought/Granted                  Emotional Assessment Appearance:: Appears younger than stated age Attitude/Demeanor/Rapport: Engaged Affect (typically observed): Appropriate Orientation: : Oriented to Self Alcohol /  Substance Use: Not Applicable Psych Involvement: No (comment)  Admission diagnosis:  Dehydration [E86.0] Weakness [R53.1] Generalized weakness [R53.1] Atrial fibrillation with RVR (HCC) [I48.91] Patient Active Problem List   Diagnosis Date Noted   Paroxysmal atrial fibrillation (HCC) 04/01/2023   Generalized weakness 04/01/2023   Elevated troponin 04/01/2023   Agitation-secondary to advanced age and a new environment. 04/01/2023   CKD stage 3a, GFR 45-59 ml/min (HCC) 10/23/2021   Routine general medical examination at a health care facility 06/29/2019   Aortic valve regurgitation 07/01/2017   Age-related physical debility 08/10/2014   SVT (supraventricular tachycardia) (HCC) 02/23/2014   DJD (degenerative joint disease) 04/07/2013   Breast cancer- s/p mastectomy 03/11/2013   GERD (gastroesophageal reflux disease) 03/10/2008   Unspecified glaucoma 09/30/2007   Essential hypertension 09/30/2007   History of GI bleed 09/30/2007   PCP:  [name], MD Pharmacy:   [facility] [address] [place] - [address] [place] [address] Phone: [phone] Fax: [phone]  [id number] - [place], [place] - [address] AT [facility] [address] [place]  [address] Phone: [phone] Fax: [phone]     Social Determinants of Health (SDOH) Social History: SDOH Screenings   Food Insecurity: No Food Insecurity (08/18/2017)  Transportation Needs: No Transportation Needs (08/18/2017)  Depression (PHQ2-9): Medium Risk (11/01/2021)  Financial Resource Strain: Low Risk  (08/18/2017)  Physical Activity: Inactive (08/18/2017)  Social Connections: Moderately Integrated (08/18/2017)  Stress: No Stress Concern Present (08/18/2017)  Tobacco Use: Low Risk  (04/01/2023)   SDOH Interventions:     Readmission Risk Interventions     No data to display

## 2023-04-02 NOTE — [place] FL2 (Signed)
[place] MEDICAID FL2 LEVEL OF CARE FORM     IDENTIFICATION  Patient Name: [name] Birthdate: June 27, 1921 Sex: female Admission Date (Current Location): 03/31/2023  [facility] and [place] Number:  Producer, television/film/video and Address:  [facility] [place], [address] [place], [place] [address]      Provider Number: [id number]  Attending Physician Name and Address:  [name], MD  Relative Name and Phone Number:  Huntlee Winsett, daughter, [phone]    Current Level of Care: Hospital Recommended Level of Care: Skilled Nursing Facility Prior Approval Number:    Date Approved/Denied:   PASRR Number: [id number]  Discharge Plan: SNF    Current Diagnoses: Patient Active Problem List   Diagnosis Date Noted   Paroxysmal atrial fibrillation (HCC) 04/01/2023   Generalized weakness 04/01/2023   Elevated troponin 04/01/2023   Agitation-secondary to advanced age and a new environment. 04/01/2023   CKD stage 3a, GFR 45-59 ml/min (HCC) 10/23/2021   Routine general medical examination at a health care facility 06/29/2019   Aortic valve regurgitation 07/01/2017   Age-related physical debility 08/10/2014   SVT (supraventricular tachycardia) (HCC) 02/23/2014   DJD (degenerative joint disease) 04/07/2013   Breast cancer- s/p mastectomy 03/11/2013   GERD (gastroesophageal reflux disease) 03/10/2008   Unspecified glaucoma 09/30/2007   Essential hypertension 09/30/2007   History of GI bleed 09/30/2007    Orientation RESPIRATION BLADDER Height & Weight     Self  Normal Incontinent Weight: 136 lb 11 oz (62 kg) Height:  5\' 7"  (170.2 cm)  BEHAVIORAL SYMPTOMS/MOOD NEUROLOGICAL BOWEL NUTRITION STATUS      Incontinent Diet (see discharge summary)  AMBULATORY STATUS COMMUNICATION OF NEEDS Skin   Extensive Assist Verbally Normal                       Personal Care Assistance Level of Assistance  Bathing, Feeding, Dressing Bathing Assistance: Maximum  assistance Feeding assistance: Independent Dressing Assistance: Maximum assistance     Functional Limitations Info  Sight, Hearing, Speech Sight Info: Adequate Hearing Info: Adequate Speech Info: Adequate    SPECIAL CARE FACTORS FREQUENCY  OT (By licensed OT), PT (By licensed PT)     PT Frequency: 5x/week OT Frequency: 5x/week            Contractures Contractures Info: Not present    Additional Factors Info  Code Status, Allergies Code Status Info: DNR Allergies Info: Codeine           Current Medications (04/02/2023):  This is the current hospital active medication list Current Facility-Administered Medications  Medication Dose Route Frequency Provider Last Rate Last Admin   acetaminophen (TYLENOL) tablet 650 mg  650 mg Oral Q6H PRN Janalyn Shy, Subrina, MD       Or   acetaminophen (TYLENOL) suppository 650 mg  650 mg Rectal Q6H PRN Janalyn Shy, Subrina, MD       amLODipine (NORVASC) tablet 10 mg  10 mg Oral Daily Sundil, Subrina, MD   10 mg at 04/02/23 0846   enoxaparin (LOVENOX) injection 40 mg  40 mg Subcutaneous Q24H Carollee Herter, DO   40 mg at 04/02/23 0845   hydrALAZINE (APRESOLINE) injection 5 mg  5 mg Intravenous Q8H PRN Sundil, Subrina, MD       lisinopril (ZESTRIL) tablet 40 mg  40 mg Oral Daily Sundil, Subrina, MD   40 mg at 04/02/23 0846   metoprolol tartrate (LOPRESSOR) tablet 50 mg  50 mg Oral BID Tereasa Coop, MD  50 mg at 04/02/23 0846   OLANZapine (ZYPREXA) tablet 2.5 mg  2.5 mg Oral QHS Sundil, Subrina, MD   2.5 mg at 04/01/23 2035   ondansetron (ZOFRAN) tablet 4 mg  4 mg Oral Q6H PRN Janalyn Shy Subrina, MD       Or   ondansetron [facility]) injection 4 mg  4 mg Intravenous Q6H PRN Janalyn Shy, Subrina, MD       pantoprazole (PROTONIX) EC tablet 40 mg  40 mg Oral Daily Sundil, Subrina, MD   40 mg at 04/02/23 0846   sodium chloride flush (NS) 0.9 % injection 3 mL  3 mL Intravenous Q12H Sundil, Subrina, MD   3 mL at 04/02/23 0850   sodium chloride flush (NS) 0.9 %  injection 3 mL  3 mL Intravenous PRN Tereasa Coop, MD         Discharge Medications: Please see discharge summary for a list of discharge medications.  Relevant Imaging Results:  Relevant Lab Results:   Additional Information SSN: [id number]  [name], [place]

## 2023-04-02 NOTE — Care Management Obs Status (Cosign Needed)
MEDICARE OBSERVATION STATUS NOTIFICATION   Patient Details  Name: LANIECE HORSTMAN MRN: [id number] Date of Birth: Jul 13, 1921   Medicare Observation Status Notification Given:  Yes    Janae Bridgeman, RN 04/02/2023, 10:24 AM

## 2023-04-02 NOTE — Progress Notes (Addendum)
PROGRESS NOTE       [name]        ZOX:[id number] DOB: 01-04-1922 DOA: 03/31/2023 PCP: [name], MD         89/F w hypertension, aortic valve regurgitation, paroxysmal SVT on metoprolol, prior GI bleed, breast cancer, CKD 3A, and GERD presented to emergency department for the evaluation for generalized weakness, difficulties with ambulation, recently started on MiraLAX for constipation and had multiple loose stools following this, family also noticed mild confusion recently. -In the ED vital signs were stable, labs largely unremarkable, UA benign, chest x-ray with mild cardiomegaly, noted to be in A-fib thereafter converted to sinus rhythm -Noted to be delirious and agitated in the ER, admitted, given Zyprexa and a liter of fluids   Subjective: Patient seen and examined, Dr. Nicky Pugh notes reviewed from yesterday, she denies any diarrhea to me today, denies any complaints, sitting up eating breakfast   Assessment and Plan:    Generalized weakness -No acute trigger identified at this time, recent diarrhea after MiraLAX use last week has resolved -Neuroexam is nonfocal, CT head was unremarkable -Labs urinalysis chest x-ray was benign as well -Check TSH and free T4 -Family wants patient to go to SNF for short-term rehab, TOC following, medically stable   Paroxysmal atrial fibrillation (HCC) -appears to be a new diagnosis, noted to be in A-fib in the ER, shortly thereafter converted to sinus rhythm  -I agree with my partners regarding avoiding anticoagulation in this elderly frail 89 year old with prior history of GI bleeds -Continue metoprolol   Hospital delirium Pt likely has age-related dementia/vascular dementia.  CT head unremarkable -Labs UA benign, started on low-dose Zyprexa on admission, continue   Elevated troponin Likely due to CKD and not ACS.   CKD stage 3a, GFR 45-59 ml/min (HCC) Stable.   Age-related physical debility Chronic.   SVT  (supraventricular tachycardia) (HCC) Chronic. Continue lopressor.   History of GI bleed Stable   GERD (gastroesophageal reflux disease) Continue protonix 40 mg daily.   Essential hypertension Continue lopressor 50 mg bid, norvasc 10 mg daily     DVT prophylaxis: Lovenox Code Status: DNR are Family Communication: No family at bedside Disposition Plan: SNF when bed available   Consultants:      Procedures:    Antimicrobials:      Objective:       Vitals:    04/02/23 0028 04/02/23 0400 04/02/23 0529 04/02/23 0740  BP: 121/68   (!) 153/76 (!) 146/68  Pulse: 63   76 65  Resp: 16   18 17   Temp: 97.8 F (36.6 C)   98.2 F (36.8 C) (!) 97.4 F (36.3 C)  TempSrc: Axillary   Axillary Oral  SpO2: 90%   97% 100%  Weight:   62 kg      Height:              Intake/Output Summary (Last 24 hours) at 04/02/2023 1219 Last data filed at 04/02/2023 0850    Gross per 24 hour  Intake 83 ml  Output --  Net 83 ml         Filed Weights    03/31/23 1753 04/01/23 0353 04/02/23 0400  Weight: 68 kg 63.1 kg 62 kg      Examination:   General exam: Elderly female laying in bed appears calm and comfortable, AAO x 2, mild cognitive deficits Respiratory system: Clear to auscultation Cardiovascular system: S1 & S2 heard, RRR.  Abd: nondistended, soft and  PROGRESS NOTE       [name]        ZOX:[id number] DOB: 01-04-1922 DOA: 03/31/2023 PCP: [name], MD         89/F w hypertension, aortic valve regurgitation, paroxysmal SVT on metoprolol, prior GI bleed, breast cancer, CKD 3A, and GERD presented to emergency department for the evaluation for generalized weakness, difficulties with ambulation, recently started on MiraLAX for constipation and had multiple loose stools following this, family also noticed mild confusion recently. -In the ED vital signs were stable, labs largely unremarkable, UA benign, chest x-ray with mild cardiomegaly, noted to be in A-fib thereafter converted to sinus rhythm -Noted to be delirious and agitated in the ER, admitted, given Zyprexa and a liter of fluids   Subjective: Patient seen and examined, Dr. Nicky Pugh notes reviewed from yesterday, she denies any diarrhea to me today, denies any complaints, sitting up eating breakfast   Assessment and Plan:    Generalized weakness -No acute trigger identified at this time, recent diarrhea after MiraLAX use last week has resolved -Neuroexam is nonfocal, CT head was unremarkable -Labs urinalysis chest x-ray was benign as well -Check TSH and free T4 -Family wants patient to go to SNF for short-term rehab, TOC following, medically stable   Paroxysmal atrial fibrillation (HCC) -appears to be a new diagnosis, noted to be in A-fib in the ER, shortly thereafter converted to sinus rhythm  -I agree with my partners regarding avoiding anticoagulation in this elderly frail 89 year old with prior history of GI bleeds -Continue metoprolol   Hospital delirium Pt likely has age-related dementia/vascular dementia.  CT head unremarkable -Labs UA benign, started on low-dose Zyprexa on admission, continue   Elevated troponin Likely due to CKD and not ACS.   CKD stage 3a, GFR 45-59 ml/min (HCC) Stable.   Age-related physical debility Chronic.   SVT  (supraventricular tachycardia) (HCC) Chronic. Continue lopressor.   History of GI bleed Stable   GERD (gastroesophageal reflux disease) Continue protonix 40 mg daily.   Essential hypertension Continue lopressor 50 mg bid, norvasc 10 mg daily     DVT prophylaxis: Lovenox Code Status: DNR are Family Communication: No family at bedside Disposition Plan: SNF when bed available   Consultants:      Procedures:    Antimicrobials:      Objective:       Vitals:    04/02/23 0028 04/02/23 0400 04/02/23 0529 04/02/23 0740  BP: 121/68   (!) 153/76 (!) 146/68  Pulse: 63   76 65  Resp: 16   18 17   Temp: 97.8 F (36.6 C)   98.2 F (36.8 C) (!) 97.4 F (36.3 C)  TempSrc: Axillary   Axillary Oral  SpO2: 90%   97% 100%  Weight:   62 kg      Height:              Intake/Output Summary (Last 24 hours) at 04/02/2023 1219 Last data filed at 04/02/2023 0850    Gross per 24 hour  Intake 83 ml  Output --  Net 83 ml         Filed Weights    03/31/23 1753 04/01/23 0353 04/02/23 0400  Weight: 68 kg 63.1 kg 62 kg      Examination:   General exam: Elderly female laying in bed appears calm and comfortable, AAO x 2, mild cognitive deficits Respiratory system: Clear to auscultation Cardiovascular system: S1 & S2 heard, RRR.  Abd: nondistended, soft and  RADIATION DOSE REDUCTION: This exam was performed according to the departmental dose-optimization program which includes automated exposure control, adjustment of the mA and/or kV according to patient size and/or use of iterative reconstruction technique. COMPARISON:  02/13/2005 CT head FINDINGS: Brain: No evidence of acute infarction, hemorrhage, mass, mass effect, or midline shift. No hydrocephalus or extra-axial fluid collection. Periventricular white matter changes, likely the sequela of chronic small vessel ischemic disease. Age related cerebral volume loss. Vascular: No hyperdense vessel. Atherosclerotic calcifications in the intracranial carotid and vertebral arteries. Skull: Negative for fracture or focal lesion. Sinuses/Orbits: No acute finding. Other: The mastoid air cells are well aerated. IMPRESSION: No acute intracranial process. Electronically Signed   By: Wiliam Ke M.D.   On: 04/01/2023 16:43    ECHOCARDIOGRAM COMPLETE   Result Date: 04/01/2023    ECHOCARDIOGRAM REPORT   Patient Name:   [name] [facility] Date of Exam: 04/01/2023 Medical Rec #:  [id number]        Height:       67.0 in Accession #:    [id number]       Weight:       139.1 lb Date of Birth:  1922/02/13         BSA:          1.733 m Patient Age:    89 years         BP:           176/91 mmHg Patient Gender: F                HR:           100 bpm. Exam Location:  Inpatient Procedure: 2D Echo, Cardiac Doppler and Color Doppler Indications:    Atrial Fibrillation   History:        Patient has prior history of Echocardiogram examinations, most                 recent 08/04/2014. Risk Factors:Hypertension.  Sonographer:    Karma Ganja Referring Phys: [id number] SUBRINA SUNDIL  Sonographer Comments: Image acquisition challenging due to patient body habitus. IMPRESSIONS  1. Left ventricular ejection fraction, by estimation, is 60 to 65%. The left ventricle has normal function. The left ventricle has no regional wall motion abnormalities. There is moderate concentric left ventricular hypertrophy. Left ventricular diastolic parameters are indeterminate. Elevated left ventricular end-diastolic pressure.  2. Right ventricular systolic function is normal. The right ventricular size is normal. Tricuspid regurgitation signal is inadequate for assessing PA pressure.  3. The mitral valve is normal in structure. Trivial mitral valve regurgitation. No evidence of mitral stenosis.  4. The aortic valve is tricuspid. Aortic valve regurgitation is trivial. Aortic valve sclerosis is present, with no evidence of aortic valve stenosis. Aortic valve area, by VTI measures 2.58 cm. Aortic valve mean gradient measures 4.0 mmHg. Aortic valve Vmax measures 1.29 m/s.  5. The inferior vena cava is normal in size with <50% respiratory variability, suggesting right atrial pressure of 8 mmHg. FINDINGS  Left Ventricle: Left ventricular ejection fraction, by estimation, is 60 to 65%. The left ventricle has normal function. The left ventricle has no regional wall motion abnormalities. The left ventricular internal cavity size was normal in size. There is  moderate concentric left ventricular hypertrophy. Left ventricular diastolic parameters are indeterminate. Elevated left ventricular end-diastolic pressure. Right Ventricle: The right ventricular size is normal. No increase in right ventricular wall thickness. Right ventricular systolic function is normal. Tricuspid regurgitation signal is inadequate for assessing  PROGRESS NOTE       [name]        ZOX:[id number] DOB: 01-04-1922 DOA: 03/31/2023 PCP: [name], MD         89/F w hypertension, aortic valve regurgitation, paroxysmal SVT on metoprolol, prior GI bleed, breast cancer, CKD 3A, and GERD presented to emergency department for the evaluation for generalized weakness, difficulties with ambulation, recently started on MiraLAX for constipation and had multiple loose stools following this, family also noticed mild confusion recently. -In the ED vital signs were stable, labs largely unremarkable, UA benign, chest x-ray with mild cardiomegaly, noted to be in A-fib thereafter converted to sinus rhythm -Noted to be delirious and agitated in the ER, admitted, given Zyprexa and a liter of fluids   Subjective: Patient seen and examined, Dr. Nicky Pugh notes reviewed from yesterday, she denies any diarrhea to me today, denies any complaints, sitting up eating breakfast   Assessment and Plan:    Generalized weakness -No acute trigger identified at this time, recent diarrhea after MiraLAX use last week has resolved -Neuroexam is nonfocal, CT head was unremarkable -Labs urinalysis chest x-ray was benign as well -Check TSH and free T4 -Family wants patient to go to SNF for short-term rehab, TOC following, medically stable   Paroxysmal atrial fibrillation (HCC) -appears to be a new diagnosis, noted to be in A-fib in the ER, shortly thereafter converted to sinus rhythm  -I agree with my partners regarding avoiding anticoagulation in this elderly frail 89 year old with prior history of GI bleeds -Continue metoprolol   Hospital delirium Pt likely has age-related dementia/vascular dementia.  CT head unremarkable -Labs UA benign, started on low-dose Zyprexa on admission, continue   Elevated troponin Likely due to CKD and not ACS.   CKD stage 3a, GFR 45-59 ml/min (HCC) Stable.   Age-related physical debility Chronic.   SVT  (supraventricular tachycardia) (HCC) Chronic. Continue lopressor.   History of GI bleed Stable   GERD (gastroesophageal reflux disease) Continue protonix 40 mg daily.   Essential hypertension Continue lopressor 50 mg bid, norvasc 10 mg daily     DVT prophylaxis: Lovenox Code Status: DNR are Family Communication: No family at bedside Disposition Plan: SNF when bed available   Consultants:      Procedures:    Antimicrobials:      Objective:       Vitals:    04/02/23 0028 04/02/23 0400 04/02/23 0529 04/02/23 0740  BP: 121/68   (!) 153/76 (!) 146/68  Pulse: 63   76 65  Resp: 16   18 17   Temp: 97.8 F (36.6 C)   98.2 F (36.8 C) (!) 97.4 F (36.3 C)  TempSrc: Axillary   Axillary Oral  SpO2: 90%   97% 100%  Weight:   62 kg      Height:              Intake/Output Summary (Last 24 hours) at 04/02/2023 1219 Last data filed at 04/02/2023 0850    Gross per 24 hour  Intake 83 ml  Output --  Net 83 ml         Filed Weights    03/31/23 1753 04/01/23 0353 04/02/23 0400  Weight: 68 kg 63.1 kg 62 kg      Examination:   General exam: Elderly female laying in bed appears calm and comfortable, AAO x 2, mild cognitive deficits Respiratory system: Clear to auscultation Cardiovascular system: S1 & S2 heard, RRR.  Abd: nondistended, soft and  PROGRESS NOTE       [name]        ZOX:[id number] DOB: 01-04-1922 DOA: 03/31/2023 PCP: [name], MD         89/F w hypertension, aortic valve regurgitation, paroxysmal SVT on metoprolol, prior GI bleed, breast cancer, CKD 3A, and GERD presented to emergency department for the evaluation for generalized weakness, difficulties with ambulation, recently started on MiraLAX for constipation and had multiple loose stools following this, family also noticed mild confusion recently. -In the ED vital signs were stable, labs largely unremarkable, UA benign, chest x-ray with mild cardiomegaly, noted to be in A-fib thereafter converted to sinus rhythm -Noted to be delirious and agitated in the ER, admitted, given Zyprexa and a liter of fluids   Subjective: Patient seen and examined, Dr. Nicky Pugh notes reviewed from yesterday, she denies any diarrhea to me today, denies any complaints, sitting up eating breakfast   Assessment and Plan:    Generalized weakness -No acute trigger identified at this time, recent diarrhea after MiraLAX use last week has resolved -Neuroexam is nonfocal, CT head was unremarkable -Labs urinalysis chest x-ray was benign as well -Check TSH and free T4 -Family wants patient to go to SNF for short-term rehab, TOC following, medically stable   Paroxysmal atrial fibrillation (HCC) -appears to be a new diagnosis, noted to be in A-fib in the ER, shortly thereafter converted to sinus rhythm  -I agree with my partners regarding avoiding anticoagulation in this elderly frail 89 year old with prior history of GI bleeds -Continue metoprolol   Hospital delirium Pt likely has age-related dementia/vascular dementia.  CT head unremarkable -Labs UA benign, started on low-dose Zyprexa on admission, continue   Elevated troponin Likely due to CKD and not ACS.   CKD stage 3a, GFR 45-59 ml/min (HCC) Stable.   Age-related physical debility Chronic.   SVT  (supraventricular tachycardia) (HCC) Chronic. Continue lopressor.   History of GI bleed Stable   GERD (gastroesophageal reflux disease) Continue protonix 40 mg daily.   Essential hypertension Continue lopressor 50 mg bid, norvasc 10 mg daily     DVT prophylaxis: Lovenox Code Status: DNR are Family Communication: No family at bedside Disposition Plan: SNF when bed available   Consultants:      Procedures:    Antimicrobials:      Objective:       Vitals:    04/02/23 0028 04/02/23 0400 04/02/23 0529 04/02/23 0740  BP: 121/68   (!) 153/76 (!) 146/68  Pulse: 63   76 65  Resp: 16   18 17   Temp: 97.8 F (36.6 C)   98.2 F (36.8 C) (!) 97.4 F (36.3 C)  TempSrc: Axillary   Axillary Oral  SpO2: 90%   97% 100%  Weight:   62 kg      Height:              Intake/Output Summary (Last 24 hours) at 04/02/2023 1219 Last data filed at 04/02/2023 0850    Gross per 24 hour  Intake 83 ml  Output --  Net 83 ml         Filed Weights    03/31/23 1753 04/01/23 0353 04/02/23 0400  Weight: 68 kg 63.1 kg 62 kg      Examination:   General exam: Elderly female laying in bed appears calm and comfortable, AAO x 2, mild cognitive deficits Respiratory system: Clear to auscultation Cardiovascular system: S1 & S2 heard, RRR.  Abd: nondistended, soft and

## 2023-04-03 ENCOUNTER — Observation Stay (HOSPITAL_COMMUNITY): Payer: Medicare Other

## 2023-04-03 DIAGNOSIS — R531 Weakness: Secondary | ICD-10-CM | POA: Diagnosis not present

## 2023-04-03 DIAGNOSIS — K5641 Fecal impaction: Secondary | ICD-10-CM | POA: Diagnosis not present

## 2023-04-03 LAB — BASIC METABOLIC PANEL
Anion gap: 10 (ref 5–15)
BUN: 18 mg/dL (ref 8–23)
CO2: 23 mmol/L (ref 22–32)
Calcium: 8.6 mg/dL — ABNORMAL LOW (ref 8.9–10.3)
Chloride: 105 mmol/L (ref 98–111)
Creatinine, Ser: 1.04 mg/dL — ABNORMAL HIGH (ref 0.44–1.00)
GFR, Estimated: 48 mL/min — ABNORMAL LOW (ref >60)
Glucose, Bld: 167 mg/dL — ABNORMAL HIGH (ref 70–99)
Potassium: 3.9 mmol/L (ref 3.5–5.1)
Sodium: 138 mmol/L (ref 135–145)

## 2023-04-03 LAB — MAGNESIUM: Magnesium: 2.1 mg/dL (ref 1.7–2.4)

## 2023-04-03 LAB — AMMONIA: Ammonia: 21 umol/L (ref 9–35)

## 2023-04-03 LAB — VITAMIN B12: Vitamin B-12: 133 pg/mL — ABNORMAL LOW (ref 180–914)

## 2023-04-03 LAB — VITAMIN D 25 HYDROXY (VIT D DEFICIENCY, FRACTURES): Vit D, 25-Hydroxy: 23.62 ng/mL — ABNORMAL LOW (ref 30–100)

## 2023-04-03 MED ORDER — VITAMIN D (ERGOCALCIFEROL) 1.25 MG (50000 UNIT) PO CAPS: 50000.0000 [IU] | ORAL_CAPSULE | ORAL | 0 refills | Status: DC

## 2023-04-03 MED ORDER — MINERAL OIL RE ENEM
1.0000 | ENEMA | Freq: Once | RECTAL | Status: AC
  Administered 2023-04-03: 1 via RECTAL
  Filled 2023-04-03: qty 1

## 2023-04-03 MED ORDER — RISAQUAD PO CAPS
1.0000 | ORAL_CAPSULE | Freq: Every day | ORAL | Status: DC
  Administered 2023-04-03 – 2023-04-04 (×2): 1 via ORAL
  Filled 2023-04-03 (×2): qty 1

## 2023-04-03 MED ORDER — SODIUM CHLORIDE 0.9 % IV SOLN: INTRAVENOUS | Status: AC

## 2023-04-03 MED ORDER — CYANOCOBALAMIN 1000 MCG/ML IJ SOLN
1000.0000 ug | Freq: Every day | INTRAMUSCULAR | Status: DC
  Administered 2023-04-03 – 2023-04-04 (×2): 1000 ug via INTRAMUSCULAR
  Filled 2023-04-03 (×2): qty 1

## 2023-04-03 MED ORDER — THIAMINE HCL 100 MG/ML IJ SOLN
500.0000 mg | Freq: Every day | INTRAVENOUS | Status: DC
  Administered 2023-04-03 – 2023-04-04 (×2): 500 mg via INTRAVENOUS
  Filled 2023-04-03 (×2): qty 5

## 2023-04-03 MED ORDER — VITAMIN D (ERGOCALCIFEROL) 1.25 MG (50000 UNIT) PO CAPS
50000.0000 [IU] | ORAL_CAPSULE | ORAL | Status: DC
  Administered 2023-04-03: 50000 [IU] via ORAL
  Filled 2023-04-03: qty 1

## 2023-04-03 NOTE — Progress Notes (Addendum)
PROGRESS NOTE    [name]  RUE:[id number] DOB: 11-12-1921 DOA: 03/31/2023 PCP: [name], MD   Brief Narrative: 89 year old with past medical history significant for hypertension, aortic valve orientation, paroxysmal SVT on metoprolol, prior GI bleed, breast cancer, CKD 3A, GERD, presents to the ED for evaluation of generalized weakness, difficulties with ambulation, recently started on MiraLAX for constipation and had multiple loose bowel movements.  Patient family also noticed mild confusion recently.  Evaluation in the ED UA was benign, chest x-ray with mild cardiomegaly, patient was delirious and agitated in the ER.  She received Zyprexa and IV fluids.   Assessment & Plan:   Principal Problem:   Generalized weakness Active Problems:   Paroxysmal atrial fibrillation (HCC)   Essential hypertension   GERD (gastroesophageal reflux disease)   History of GI bleed   SVT (supraventricular tachycardia) (HCC)   Age-related physical debility   CKD stage 3a, GFR 45-59 ml/min (HCC)   Elevated troponin   Agitation-secondary to advanced age and a new environment.   1-Generalized weakness -Suspect related to dehydration triggered by recent diarrheal illness.  TSH normal. -Also component of low B12 and vitamin D.  Will start supplements -Could be in the setting of deconditioning. -Plan for rehab awaiting bed  Paroxysmal A-fib -Not on anticoagulation due to risk of bleeding.  -On rate control medication.   Hospital delirium -On Zyprexa.  -TSH; 2.1 -Start Thiamine supplement.   Diarrhea:  -C. difficile initially ordered but not obtained. -Treated with KUB to rule out obstruction, overflow diarrhea Had small BM after cleaning.  No abdominal pain, no leukocytosis.  Start probiotics.  Start Fluids.   AKI;  Start IV fluids.  Hold Lisinopril.   Elevated troponin -in setting of CKD.  -Flat elevation, mild.  -ECHO: EF 60 % no wall motion abnormalities.   CKD  stage III A; -Stable.    Age-related physical debility -B 12 deficiency. Start supplement.  -Vitamin D deficiency start supplement.   SVT: -Continue Lopressor  History of GI bleed: -Hb has been stable.   GERD: -Continue with Protonix daily  Hypertension:  -Continue with Lopressor and Norvasc, lisinopril      Estimated body mass index is 21.82 kg/m as calculated from the following:   Height as of this encounter: 5\' 7"  (1.702 m).   Weight as of this encounter: 63.2 kg.   DVT prophylaxis: will do SCD for prophylasix, family preference due to ho GI bleed.  Code Status: DNR Family Communication: Daughter at bedside.  Disposition Plan:  Status is: Observation The patient remains OBS appropriate and will d/c before 2 midnights. Awaiting SNF    Consultants:  none  Procedures:  ECHO  Antimicrobials:    Subjective: She is alert, pleasantly confuse. Denies abdominal pain.  Had diarrhea still per family Objective: Vitals:   04/02/23 1600 04/02/23 1940 04/03/23 0224 04/03/23 0529  BP: 129/66 121/75  (!) 156/57  Pulse: 72 75  89  Resp: 18   17  Temp: 98.4 F (36.9 C) 98.2 F (36.8 C)  98.5 F (36.9 C)  TempSrc: Oral Oral  Oral  SpO2: 99% 100%  100%  Weight:   63.2 kg   Height:        Intake/Output Summary (Last 24 hours) at 04/03/2023 0737 Last data filed at 04/02/2023 0850 Gross per 24 hour  Intake 80 ml  Output --  Net 80 ml   Filed Weights   04/01/23 0353 04/02/23 0400 04/03/23 0224  Weight: 63.1 kg 62  PROGRESS NOTE    [name]  RUE:[id number] DOB: 11-12-1921 DOA: 03/31/2023 PCP: [name], MD   Brief Narrative: 89 year old with past medical history significant for hypertension, aortic valve orientation, paroxysmal SVT on metoprolol, prior GI bleed, breast cancer, CKD 3A, GERD, presents to the ED for evaluation of generalized weakness, difficulties with ambulation, recently started on MiraLAX for constipation and had multiple loose bowel movements.  Patient family also noticed mild confusion recently.  Evaluation in the ED UA was benign, chest x-ray with mild cardiomegaly, patient was delirious and agitated in the ER.  She received Zyprexa and IV fluids.   Assessment & Plan:   Principal Problem:   Generalized weakness Active Problems:   Paroxysmal atrial fibrillation (HCC)   Essential hypertension   GERD (gastroesophageal reflux disease)   History of GI bleed   SVT (supraventricular tachycardia) (HCC)   Age-related physical debility   CKD stage 3a, GFR 45-59 ml/min (HCC)   Elevated troponin   Agitation-secondary to advanced age and a new environment.   1-Generalized weakness -Suspect related to dehydration triggered by recent diarrheal illness.  TSH normal. -Also component of low B12 and vitamin D.  Will start supplements -Could be in the setting of deconditioning. -Plan for rehab awaiting bed  Paroxysmal A-fib -Not on anticoagulation due to risk of bleeding.  -On rate control medication.   Hospital delirium -On Zyprexa.  -TSH; 2.1 -Start Thiamine supplement.   Diarrhea:  -C. difficile initially ordered but not obtained. -Treated with KUB to rule out obstruction, overflow diarrhea Had small BM after cleaning.  No abdominal pain, no leukocytosis.  Start probiotics.  Start Fluids.   AKI;  Start IV fluids.  Hold Lisinopril.   Elevated troponin -in setting of CKD.  -Flat elevation, mild.  -ECHO: EF 60 % no wall motion abnormalities.   CKD  stage III A; -Stable.    Age-related physical debility -B 12 deficiency. Start supplement.  -Vitamin D deficiency start supplement.   SVT: -Continue Lopressor  History of GI bleed: -Hb has been stable.   GERD: -Continue with Protonix daily  Hypertension:  -Continue with Lopressor and Norvasc, lisinopril      Estimated body mass index is 21.82 kg/m as calculated from the following:   Height as of this encounter: 5\' 7"  (1.702 m).   Weight as of this encounter: 63.2 kg.   DVT prophylaxis: will do SCD for prophylasix, family preference due to ho GI bleed.  Code Status: DNR Family Communication: Daughter at bedside.  Disposition Plan:  Status is: Observation The patient remains OBS appropriate and will d/c before 2 midnights. Awaiting SNF    Consultants:  none  Procedures:  ECHO  Antimicrobials:    Subjective: She is alert, pleasantly confuse. Denies abdominal pain.  Had diarrhea still per family Objective: Vitals:   04/02/23 1600 04/02/23 1940 04/03/23 0224 04/03/23 0529  BP: 129/66 121/75  (!) 156/57  Pulse: 72 75  89  Resp: 18   17  Temp: 98.4 F (36.9 C) 98.2 F (36.8 C)  98.5 F (36.9 C)  TempSrc: Oral Oral  Oral  SpO2: 99% 100%  100%  Weight:   63.2 kg   Height:        Intake/Output Summary (Last 24 hours) at 04/03/2023 0737 Last data filed at 04/02/2023 0850 Gross per 24 hour  Intake 80 ml  Output --  Net 80 ml   Filed Weights   04/01/23 0353 04/02/23 0400 04/03/23 0224  Weight: 63.1 kg 62  PROGRESS NOTE    [name]  RUE:[id number] DOB: 11-12-1921 DOA: 03/31/2023 PCP: [name], MD   Brief Narrative: 89 year old with past medical history significant for hypertension, aortic valve orientation, paroxysmal SVT on metoprolol, prior GI bleed, breast cancer, CKD 3A, GERD, presents to the ED for evaluation of generalized weakness, difficulties with ambulation, recently started on MiraLAX for constipation and had multiple loose bowel movements.  Patient family also noticed mild confusion recently.  Evaluation in the ED UA was benign, chest x-ray with mild cardiomegaly, patient was delirious and agitated in the ER.  She received Zyprexa and IV fluids.   Assessment & Plan:   Principal Problem:   Generalized weakness Active Problems:   Paroxysmal atrial fibrillation (HCC)   Essential hypertension   GERD (gastroesophageal reflux disease)   History of GI bleed   SVT (supraventricular tachycardia) (HCC)   Age-related physical debility   CKD stage 3a, GFR 45-59 ml/min (HCC)   Elevated troponin   Agitation-secondary to advanced age and a new environment.   1-Generalized weakness -Suspect related to dehydration triggered by recent diarrheal illness.  TSH normal. -Also component of low B12 and vitamin D.  Will start supplements -Could be in the setting of deconditioning. -Plan for rehab awaiting bed  Paroxysmal A-fib -Not on anticoagulation due to risk of bleeding.  -On rate control medication.   Hospital delirium -On Zyprexa.  -TSH; 2.1 -Start Thiamine supplement.   Diarrhea:  -C. difficile initially ordered but not obtained. -Treated with KUB to rule out obstruction, overflow diarrhea Had small BM after cleaning.  No abdominal pain, no leukocytosis.  Start probiotics.  Start Fluids.   AKI;  Start IV fluids.  Hold Lisinopril.   Elevated troponin -in setting of CKD.  -Flat elevation, mild.  -ECHO: EF 60 % no wall motion abnormalities.   CKD  stage III A; -Stable.    Age-related physical debility -B 12 deficiency. Start supplement.  -Vitamin D deficiency start supplement.   SVT: -Continue Lopressor  History of GI bleed: -Hb has been stable.   GERD: -Continue with Protonix daily  Hypertension:  -Continue with Lopressor and Norvasc, lisinopril      Estimated body mass index is 21.82 kg/m as calculated from the following:   Height as of this encounter: 5\' 7"  (1.702 m).   Weight as of this encounter: 63.2 kg.   DVT prophylaxis: will do SCD for prophylasix, family preference due to ho GI bleed.  Code Status: DNR Family Communication: Daughter at bedside.  Disposition Plan:  Status is: Observation The patient remains OBS appropriate and will d/c before 2 midnights. Awaiting SNF    Consultants:  none  Procedures:  ECHO  Antimicrobials:    Subjective: She is alert, pleasantly confuse. Denies abdominal pain.  Had diarrhea still per family Objective: Vitals:   04/02/23 1600 04/02/23 1940 04/03/23 0224 04/03/23 0529  BP: 129/66 121/75  (!) 156/57  Pulse: 72 75  89  Resp: 18   17  Temp: 98.4 F (36.9 C) 98.2 F (36.8 C)  98.5 F (36.9 C)  TempSrc: Oral Oral  Oral  SpO2: 99% 100%  100%  Weight:   63.2 kg   Height:        Intake/Output Summary (Last 24 hours) at 04/03/2023 0737 Last data filed at 04/02/2023 0850 Gross per 24 hour  Intake 80 ml  Output --  Net 80 ml   Filed Weights   04/01/23 0353 04/02/23 0400 04/03/23 0224  Weight: 63.1 kg 62  139.1 lb Date of Birth:  Jun 18, 1921         BSA:          1.733 m Patient Age:    89 years         BP:           176/91 mmHg Patient Gender: F                HR:           100 bpm. Exam Location:  Inpatient Procedure: 2D Echo, Cardiac Doppler and Color Doppler Indications:    Atrial Fibrillation  History:        Patient has prior history of Echocardiogram examinations, most                 recent 08/04/2014. Risk Factors:Hypertension.  Sonographer:    Karma Ganja Referring Phys: [id number] SUBRINA SUNDIL  Sonographer Comments: Image acquisition challenging due to patient body habitus. IMPRESSIONS  1. Left ventricular ejection fraction, by estimation, is 60 to 65%. The left ventricle has normal function. The left ventricle has no regional wall motion abnormalities. There is moderate  concentric left ventricular hypertrophy. Left ventricular diastolic parameters are indeterminate. Elevated left ventricular end-diastolic pressure.  2. Right ventricular systolic function is normal. The right ventricular size is normal. Tricuspid regurgitation signal is inadequate for assessing PA pressure.  3. The mitral valve is normal in structure. Trivial mitral valve regurgitation. No evidence of mitral stenosis.  4. The aortic valve is tricuspid. Aortic valve regurgitation is trivial. Aortic valve sclerosis is present, with no evidence of aortic valve stenosis. Aortic valve area, by VTI measures 2.58 cm. Aortic valve mean gradient measures 4.0 mmHg. Aortic valve Vmax measures 1.29 m/s.  5. The inferior vena cava is normal in size with <50% respiratory variability, suggesting right atrial pressure of 8 mmHg. FINDINGS  Left Ventricle: Left ventricular ejection fraction, by estimation, is 60 to 65%. The left ventricle has normal function. The left ventricle has no regional wall motion abnormalities. The left ventricular internal cavity size was normal in size. There is  moderate concentric left ventricular hypertrophy. Left ventricular diastolic parameters are indeterminate. Elevated left ventricular end-diastolic pressure. Right Ventricle: The right ventricular size is normal. No increase in right ventricular wall thickness. Right ventricular systolic function is normal. Tricuspid regurgitation signal is inadequate for assessing PA pressure. Left Atrium: Left atrial size was normal in size. Right Atrium: Right atrial size was normal in size. Pericardium: There is no evidence of pericardial effusion. Mitral Valve: The mitral valve is normal in structure. Trivial mitral valve regurgitation. No evidence of mitral valve stenosis. Tricuspid Valve: The tricuspid valve is normal in structure. Tricuspid valve regurgitation is trivial. No evidence of tricuspid stenosis. Aortic Valve: The aortic valve is tricuspid.  Aortic valve regurgitation is trivial. Aortic valve sclerosis is present, with no evidence of aortic valve stenosis. Aortic valve mean gradient measures 4.0 mmHg. Aortic valve peak gradient measures 6.7 mmHg. Aortic valve area, by VTI measures 2.58 cm. Pulmonic Valve: The pulmonic valve was normal in structure. Pulmonic valve regurgitation is trivial. No evidence of pulmonic stenosis. Aorta: The aortic root is normal in size and structure. Venous: The inferior vena cava is normal in size with less than 50% respiratory variability, suggesting right atrial pressure of 8 mmHg. IAS/Shunts: No atrial level shunt detected by color flow Doppler.  LEFT VENTRICLE PLAX 2D LVIDd:         3.10 cm   Diastology LVIDs:

## 2023-04-03 NOTE — Plan of Care (Signed)

## 2023-04-03 NOTE — TOC Progression Note (Signed)
Transition of Care [facility]) - Progression Note    Patient Details  Name: [name] MRN: [id number] Date of Birth: [birth year]  [facility] CM/SW Contact  Sharleen Szczesny A [place], [place] Phone Number: 04/03/2023, 12:49 PM  Clinical Narrative:     CSW met with pt and pt's daughter at bedside, [name] informed CSW that she wanted [facility] for rehab. CSW to follow up with facility regarding bed availability.   TOC will continue to follow.   Expected Discharge Plan: Skilled Nursing Facility Barriers to Discharge: Continued Medical Work up, SNF Pending bed offer  Expected Discharge Plan and Services       Living arrangements for the past 2 months: Single Family Home                                       Social Determinants of Health (SDOH) Interventions SDOH Screenings   Food Insecurity: No Food Insecurity (08/18/2017)  Transportation Needs: No Transportation Needs (08/18/2017)  Depression (PHQ2-9): Medium Risk (11/01/2021)  Financial Resource Strain: Low Risk  (08/18/2017)  Physical Activity: Inactive (08/18/2017)  Social Connections: Moderately Integrated (08/18/2017)  Stress: No Stress Concern Present (08/18/2017)  Tobacco Use: Low Risk  (04/01/2023)    Readmission Risk Interventions     No data to display

## 2023-04-04 DIAGNOSIS — Z66 Do not resuscitate: Secondary | ICD-10-CM | POA: Diagnosis present

## 2023-04-04 DIAGNOSIS — I700 Atherosclerosis of aorta: Secondary | ICD-10-CM | POA: Diagnosis present

## 2023-04-04 DIAGNOSIS — E559 Vitamin D deficiency, unspecified: Secondary | ICD-10-CM | POA: Diagnosis present

## 2023-04-04 DIAGNOSIS — R54 Age-related physical debility: Secondary | ICD-10-CM | POA: Diagnosis present

## 2023-04-04 DIAGNOSIS — R531 Weakness: Secondary | ICD-10-CM | POA: Diagnosis not present

## 2023-04-04 DIAGNOSIS — N1831 Chronic kidney disease, stage 3a: Secondary | ICD-10-CM | POA: Diagnosis not present

## 2023-04-04 DIAGNOSIS — Z90710 Acquired absence of both cervix and uterus: Secondary | ICD-10-CM | POA: Diagnosis not present

## 2023-04-04 DIAGNOSIS — I4891 Unspecified atrial fibrillation: Secondary | ICD-10-CM | POA: Diagnosis not present

## 2023-04-04 DIAGNOSIS — K219 Gastro-esophageal reflux disease without esophagitis: Secondary | ICD-10-CM | POA: Diagnosis present

## 2023-04-04 DIAGNOSIS — K2100 Gastro-esophageal reflux disease with esophagitis, without bleeding: Secondary | ICD-10-CM | POA: Diagnosis not present

## 2023-04-04 DIAGNOSIS — Z79899 Other long term (current) drug therapy: Secondary | ICD-10-CM | POA: Diagnosis not present

## 2023-04-04 DIAGNOSIS — F05 Delirium due to known physiological condition: Secondary | ICD-10-CM | POA: Diagnosis present

## 2023-04-04 DIAGNOSIS — R41841 Cognitive communication deficit: Secondary | ICD-10-CM | POA: Diagnosis not present

## 2023-04-04 DIAGNOSIS — I4719 Other supraventricular tachycardia: Secondary | ICD-10-CM | POA: Diagnosis present

## 2023-04-04 DIAGNOSIS — I351 Nonrheumatic aortic (valve) insufficiency: Secondary | ICD-10-CM | POA: Diagnosis present

## 2023-04-04 DIAGNOSIS — I10 Essential (primary) hypertension: Secondary | ICD-10-CM | POA: Diagnosis not present

## 2023-04-04 DIAGNOSIS — Z885 Allergy status to narcotic agent status: Secondary | ICD-10-CM | POA: Diagnosis not present

## 2023-04-04 DIAGNOSIS — R4189 Other symptoms and signs involving cognitive functions and awareness: Secondary | ICD-10-CM | POA: Diagnosis present

## 2023-04-04 DIAGNOSIS — I480 Paroxysmal atrial fibrillation: Secondary | ICD-10-CM | POA: Diagnosis not present

## 2023-04-04 DIAGNOSIS — H409 Unspecified glaucoma: Secondary | ICD-10-CM | POA: Diagnosis present

## 2023-04-04 DIAGNOSIS — I129 Hypertensive chronic kidney disease with stage 1 through stage 4 chronic kidney disease, or unspecified chronic kidney disease: Secondary | ICD-10-CM | POA: Diagnosis present

## 2023-04-04 DIAGNOSIS — R7989 Other specified abnormal findings of blood chemistry: Secondary | ICD-10-CM | POA: Diagnosis present

## 2023-04-04 DIAGNOSIS — M6281 Muscle weakness (generalized): Secondary | ICD-10-CM | POA: Diagnosis not present

## 2023-04-04 DIAGNOSIS — R5381 Other malaise: Secondary | ICD-10-CM | POA: Diagnosis not present

## 2023-04-04 DIAGNOSIS — E538 Deficiency of other specified B group vitamins: Secondary | ICD-10-CM | POA: Diagnosis present

## 2023-04-04 DIAGNOSIS — Z9012 Acquired absence of left breast and nipple: Secondary | ICD-10-CM | POA: Diagnosis not present

## 2023-04-04 DIAGNOSIS — N183 Chronic kidney disease, stage 3 unspecified: Secondary | ICD-10-CM | POA: Diagnosis not present

## 2023-04-04 DIAGNOSIS — E860 Dehydration: Secondary | ICD-10-CM | POA: Diagnosis not present

## 2023-04-04 DIAGNOSIS — I639 Cerebral infarction, unspecified: Secondary | ICD-10-CM | POA: Diagnosis not present

## 2023-04-04 DIAGNOSIS — R2681 Unsteadiness on feet: Secondary | ICD-10-CM | POA: Diagnosis not present

## 2023-04-04 DIAGNOSIS — R197 Diarrhea, unspecified: Secondary | ICD-10-CM | POA: Diagnosis not present

## 2023-04-04 DIAGNOSIS — Z7401 Bed confinement status: Secondary | ICD-10-CM | POA: Diagnosis not present

## 2023-04-04 DIAGNOSIS — I471 Supraventricular tachycardia, unspecified: Secondary | ICD-10-CM | POA: Diagnosis not present

## 2023-04-04 DIAGNOSIS — K5641 Fecal impaction: Secondary | ICD-10-CM | POA: Diagnosis present

## 2023-04-04 DIAGNOSIS — N179 Acute kidney failure, unspecified: Secondary | ICD-10-CM | POA: Diagnosis not present

## 2023-04-04 DIAGNOSIS — Z853 Personal history of malignant neoplasm of breast: Secondary | ICD-10-CM | POA: Diagnosis not present

## 2023-04-04 LAB — BASIC METABOLIC PANEL
Anion gap: 7 (ref 5–15)
BUN: 23 mg/dL (ref 8–23)
CO2: 24 mmol/L (ref 22–32)
Calcium: 8.3 mg/dL — ABNORMAL LOW (ref 8.9–10.3)
Chloride: 107 mmol/L (ref 98–111)
Creatinine, Ser: 1.09 mg/dL — ABNORMAL HIGH (ref 0.44–1.00)
GFR, Estimated: 46 mL/min — ABNORMAL LOW (ref >60)
Glucose, Bld: 107 mg/dL — ABNORMAL HIGH (ref 70–99)
Potassium: 3.5 mmol/L (ref 3.5–5.1)
Sodium: 138 mmol/L (ref 135–145)

## 2023-04-04 MED ORDER — VITAMIN B-12 1000 MCG PO TABS: 1000.0000 ug | ORAL_TABLET | Freq: Every day | ORAL | 0 refills | Status: DC

## 2023-04-04 MED ORDER — THIAMINE HCL 100 MG PO TABS: 100.0000 mg | ORAL_TABLET | Freq: Every day | ORAL | 0 refills | Status: DC

## 2023-04-04 MED ORDER — FLEET ENEMA RE ENEM
1.0000 | ENEMA | Freq: Once | RECTAL | Status: AC
  Administered 2023-04-04: 1 via RECTAL
  Filled 2023-04-04: qty 1

## 2023-04-04 MED ORDER — POLYETHYLENE GLYCOL 3350 17 G PO PACK: 17.0000 g | PACK | Freq: Every day | ORAL | 0 refills | Status: DC

## 2023-04-04 NOTE — Progress Notes (Signed)
Physical Therapy Treatment [name] MRN: [id number] DOB: 17-Dec-1921 Today's Date: 04/04/2023   History of Present Illness Pt is a 89 yo female admitted 10/28 for generalized weakness. PMH: HTN, aortic valve regurgitation, paroxysmal SVT, GIB, breast CA, CKD, dementia and GERD    PT Comments  Pt greeted resting in bed, eager for OOB mobility. Pt continues to demonstrate poor sequencing and initiation needing max A and dense cues to complete bed mobility. Pt able to maintain sitting balance at EOB with supervision for safety. Pt requiring max A to attempt standing x2 to RW with pt able to clear hips from EOB each time however limited by RLE pain. Pt needing total A to squat pivot to chair with max multimodal cues for hands placement during transfer. Pt following commands well for seated LE therex with education to complete throughout day. Pt up in chair with breakfast tray set up at end of session and son at bedside. Current plan remains appropriate to address deficits and maximize functional independence and decrease caregiver burden. Pt continues to benefit from skilled PT services to progress toward functional mobility goals.       If plan is discharge home, recommend the following: Two people to help with walking and/or transfers;Two people to help with bathing/dressing/bathroom;Direct supervision/assist for medications management;Assist for transportation;Supervision due to cognitive status;Help with stairs or ramp for entrance   Can travel by private vehicle     No  Equipment Recommendations  Hoyer lift;Hospital bed;Wheelchair (measurements PT);Wheelchair cushion (measurements PT)    Recommendations for Other Services       Precautions / Restrictions Precautions Precautions: Fall Restrictions Weight Bearing Restrictions: No     Mobility  Bed Mobility Overal bed mobility: Needs Assistance Bed Mobility: Supine to Sit     Supine to sit: Max assist      General bed mobility comments: max A as pt with poor initiation and difficulty sequencing    Transfers Overall transfer level: Needs assistance Equipment used: Rolling walker (2 wheels), 1 person hand held assist Transfers: Sit to/from Stand, Bed to chair/wheelchair/BSC Sit to Stand: Max assist     Squat pivot transfers: Total assist     General transfer comment: max A to come to stand x2 trials with pt able to clear hips from EOB but unable to come to full upright standing with RLE pain limiting tolerance , total A to squat pivot to chair with pt needing max multimodal cues for hand placement during trasnfer    Ambulation/Gait               General Gait Details: unable   Stairs             Wheelchair Mobility     Tilt Bed    Modified Rankin (Stroke Patients Only)       Balance Overall balance assessment: Needs assistance Sitting-balance support: Feet supported, No upper extremity supported Sitting balance-Leahy Scale: Fair Sitting balance - Comments: EOB with CGA   Standing balance support: During functional activity, Bilateral upper extremity supported Standing balance-Leahy Scale: Zero                              Cognition Arousal: Alert Behavior During Therapy: WFL for tasks assessed/performed Overall Cognitive Status: History of cognitive impairments - at baseline Area of Impairment: Orientation, Attention, Memory                 Orientation  Level: Disoriented to, Place, Time, Situation Current Attention Level: Sustained           General Comments: pt confused throughout the session thinking she is at her home, following ~50% of simple single step commands        Exercises General Exercises - Lower Extremity Long Arc Quad: AROM, Left, Right, 10 reps, Seated Hip Flexion/Marching: AROM, AAROM, Right, Left, 10 reps, Seated    General Comments General comments (skin integrity, edema, etc.): VSS on RA, pt son present  and supportive      Pertinent Vitals/Pain Pain Assessment Pain Assessment: Faces Faces Pain Scale: Hurts little more Pain Location: R knee Pain Descriptors / Indicators: Sore, Tender Pain Intervention(s): Monitored during session, Limited activity within patient's tolerance, Repositioned    Home Living                          Prior Function            PT Goals (current goals can now be found in the care plan section) Acute Rehab PT Goals PT Goal Formulation: Patient unable to participate in goal setting Time For Goal Achievement: 04/15/23 Progress towards PT goals: Progressing toward goals    Frequency    Min 2X/week      PT Plan      Co-evaluation              AM-PAC PT "6 Clicks" Mobility   Outcome Measure  Help needed turning from your back to your side while in a flat bed without using bedrails?: Total Help needed moving from lying on your back to sitting on the side of a flat bed without using bedrails?: Total Help needed moving to and from a bed to a chair (including a wheelchair)?: Total Help needed standing up from a chair using your arms (e.g., wheelchair or bedside chair)?: Total Help needed to walk in hospital room?: Total Help needed climbing 3-5 steps with a railing? : Total 6 Click Score: 6    End of Session Equipment Utilized During Treatment: Gait belt Activity Tolerance: Patient limited by pain;Patient limited by fatigue Patient left: in chair;with call bell/phone within reach;with chair alarm set;with family/visitor present Nurse Communication: Mobility status;Need for lift equipment PT Visit Diagnosis: Other abnormalities of gait and mobility (R26.89);Muscle weakness (generalized) (M62.81)     Time: 2725-3664 PT Time Calculation (min) (ACUTE ONLY): 21 min  Charges:    $Therapeutic Activity: 8-22 mins PT General Charges $$ ACUTE PT VISIT: 1 Visit                     Anthonyjames Bargar R. PTA Acute Rehabilitation Services Office:  [phone]   Catalina Antigua 04/04/2023, 10:26 AM

## 2023-04-04 NOTE — Progress Notes (Signed)
Physical Therapy Treatment [name] MRN: [id number] DOB: 1922/03/13 Today's Date: 04/04/2023   History of Present Illness Pt is a 89 yo female admitted 10/28 for generalized weakness. PMH: HTN, aortic valve regurgitation, paroxysmal SVT, GIB, breast CA, CKD, dementia and GERD    PT Comments  Pt seen for second session by request of NT and family. Pt up in chair on arrival with NT and daughter at bedside. Pt able to come to stand x3 during session for standing peri-care with max A to rise with face to face approach. Pt needing cues for trunk extension and encouragement throughout as pt with fear of falling. Pt able to achieve full upright standing and maintain ~10 seconds each trial with mod A to maintain balance. Pt unable to progress stepping due to fatigue and RLE pain, RN aware pt requesting pain meds. Pt up in chair at end of session. Current plan remains appropriate to address deficits and maximize functional independence and decrease caregiver burden. Pt continues to benefit from skilled PT services to progress toward functional mobility goals.     If plan is discharge home, recommend the following: Two people to help with walking and/or transfers;Two people to help with bathing/dressing/bathroom;Direct supervision/assist for medications management;Assist for transportation;Supervision due to cognitive status;Help with stairs or ramp for entrance   Can travel by private vehicle     No  Equipment Recommendations  Hoyer lift;Hospital bed;Wheelchair (measurements PT);Wheelchair cushion (measurements PT)    Recommendations for Other Services       Precautions / Restrictions Precautions Precautions: Fall Restrictions Weight Bearing Restrictions: No     Mobility  Bed Mobility Overal bed mobility: Needs Assistance Bed Mobility: Supine to Sit     Supine to sit: Max assist     General bed mobility comments: pt up in chair on arrival     Transfers Overall transfer level: Needs assistance Equipment used: 1 person hand held assist (with face to face technique) Transfers: Sit to/from Stand, Bed to chair/wheelchair/BSC Sit to Stand: Max assist, Mod assist     Squat pivot transfers: Total assist     General transfer comment: max A with face to face technique to come to full stand from recliner, pt with fear of falling needing up to mod A to maintain standing balance with cues for upright posture. pt able to achieve full standing x3 for pericare from chair.    Ambulation/Gait               General Gait Details: unable   Stairs             Wheelchair Mobility     Tilt Bed    Modified Rankin (Stroke Patients Only)       Balance Overall balance assessment: Needs assistance Sitting-balance support: Feet supported, No upper extremity supported Sitting balance-Leahy Scale: Fair Sitting balance - Comments: EOB with CGA   Standing balance support: During functional activity, Bilateral upper extremity supported Standing balance-Leahy Scale: Poor Standing balance comment: mod-max A to maintain standing balance                            Cognition Arousal: Alert Behavior During Therapy: WFL for tasks assessed/performed Overall Cognitive Status: History of cognitive impairments - at baseline Area of Impairment: Orientation, Attention, Memory                 Orientation Level: Disoriented to, Place, Time, Situation Current Attention Level:  Sustained           General Comments: pt daughter present and supportive, pt able to follow all simple commands with increased time        Exercises General Exercises - Lower Extremity Long Arc Quad: AROM, Left, Right, 10 reps, Seated Hip Flexion/Marching: AROM, AAROM, Right, Left, 10 reps, Seated    General Comments General comments (skin integrity, edema, etc.): VSS on RA, NT present performing peri-care in standing, daughter present  and supportive      Pertinent Vitals/Pain Pain Assessment Pain Assessment: Faces Faces Pain Scale: Hurts little more Pain Location: R knee Pain Descriptors / Indicators: Sore, Tender Pain Intervention(s): Patient requesting pain meds-RN notified, Monitored during session, Limited activity within patient's tolerance    Home Living                          Prior Function            PT Goals (current goals can now be found in the care plan section) Acute Rehab PT Goals PT Goal Formulation: Patient unable to participate in goal setting Time For Goal Achievement: 04/15/23 Progress towards PT goals: Progressing toward goals    Frequency    Min 2X/week      PT Plan      Co-evaluation              AM-PAC PT "6 Clicks" Mobility   Outcome Measure  Help needed turning from your back to your side while in a flat bed without using bedrails?: Total Help needed moving from lying on your back to sitting on the side of a flat bed without using bedrails?: Total Help needed moving to and from a bed to a chair (including a wheelchair)?: Total Help needed standing up from a chair using your arms (e.g., wheelchair or bedside chair)?: Total Help needed to walk in hospital room?: Total Help needed climbing 3-5 steps with a railing? : Total 6 Click Score: 6    End of Session Equipment Utilized During Treatment: Gait belt Activity Tolerance: Patient limited by pain;Patient limited by fatigue Patient left: in chair;with call bell/phone within reach;with chair alarm set;with family/visitor present;with nursing/sitter in room Nurse Communication: Mobility status;Need for lift equipment PT Visit Diagnosis: Other abnormalities of gait and mobility (R26.89);Muscle weakness (generalized) (M62.81)     Time: 4098-1191 PT Time Calculation (min) (ACUTE ONLY): 10 min  Charges:    $Therapeutic Activity: 8-22 mins PT General Charges $$ ACUTE PT VISIT: 1 Visit                      [name] R. PTA Acute Rehabilitation Services Office: [phone]   [name] 04/04/2023, 11:42 AM

## 2023-04-04 NOTE — Plan of Care (Signed)
  Problem: Education: Goal: Knowledge of General Education information will improve Description: Including pain rating scale, medication(s)/side effects and non-pharmacologic comfort measures Outcome: Progressing   Problem: Health Behavior/Discharge Planning: Goal: Ability to manage health-related needs will improve Outcome: Progressing   Problem: Clinical Measurements: Goal: Ability to maintain clinical measurements within normal limits will improve Outcome: Progressing Goal: Will remain free from infection Outcome: Progressing Goal: Diagnostic test results will improve Outcome: Progressing Goal: Respiratory complications will improve Outcome: Progressing Goal: Cardiovascular complication will be avoided Outcome: Progressing   Problem: Activity: Goal: Risk for activity intolerance will decrease Outcome: Progressing   Problem: Nutrition: Goal: Adequate nutrition will be maintained Outcome: Progressing   Problem: Coping: Goal: Level of anxiety will decrease Outcome: Progressing   Problem: Elimination: Goal: Will not experience complications related to urinary retention Outcome: Progressing   Problem: Pain Management: Goal: General experience of comfort will improve Outcome: Progressing   Problem: Safety: Goal: Ability to remain free from injury will improve Outcome: Progressing   Problem: Skin Integrity: Goal: Risk for impaired skin integrity will decrease Outcome: Progressing   Problem: Elimination: Goal: Will not experience complications related to bowel motility Outcome: Not Progressing  Enema has been administered as ordered without success

## 2023-04-04 NOTE — Discharge Summary (Signed)
Physician Discharge Summary   Patient: [name] MRN: [id number] DOB: 1922/02/28  Admit date:     03/31/2023  Discharge date: 04/04/23  Discharge Physician: Alba Cory   PCP: [name], MD   Recommendations at discharge:    Needs B 12 check in 6 weeks.  Needs Vitamin d check in 6 weeks.  Encourage oral intake.    Discharge Diagnoses: Principal Problem:   Generalized weakness Active Problems:   Paroxysmal atrial fibrillation (HCC)   Essential hypertension   GERD (gastroesophageal reflux disease)   History of GI bleed   SVT (supraventricular tachycardia) (HCC)   Age-related physical debility   CKD stage 3a, GFR 45-59 ml/min (HCC)   Elevated troponin   Agitation-secondary to advanced age and a new environment.  Resolved Problems:   * No resolved hospital problems. *  Hospital Course: 89 year old with past medical history significant for hypertension, aortic valve orientation, paroxysmal SVT on metoprolol, prior GI bleed, breast cancer, CKD 3A, GERD, presents to the ED for evaluation of generalized weakness, difficulties with ambulation, recently started on MiraLAX for constipation and had multiple loose bowel movements. Patient family also noticed mild confusion recently. Evaluation in the ED UA was benign, chest x-ray with mild cardiomegaly, patient was delirious and agitated in the ER. She received Zyprexa and IV fluids.     Assessment and Plan: 1-Generalized weakness -Suspect related to dehydration triggered by recent diarrheal illness.  TSH normal. -Also component of low B12 and vitamin D.  Started supplements -Could be in the setting of deconditioning. -Plan for rehab awaiting bed   Paroxysmal A-fib -Not on anticoagulation due to risk of bleeding.  -On rate control medication.    Hospital delirium -On Zyprexa.  -TSH; 2.1 -Started Thiamine supplement.    Diarrhea:  -C. difficile initially ordered but not obtained. -Treated with KUB to  rule out obstruction, overflow diarrhea Had small BM after cleaning.  No abdominal pain, no leukocytosis.  She has been having overflow diarrhea, probably from fecal impaction.  Constipation; repeat Enema.    AKI;  On fluids IV fluids.  Hold Lisinopril.  Encourage oral intake.   Elevated troponin -in setting of CKD.  -Flat elevation, mild.  -ECHO: EF 60 % no wall motion abnormalities.    CKD stage III A; -Stable.      Age-related physical debility -B 12 deficiency. Started supplement.  -Vitamin D deficiency started supplement.    SVT: -Continue Lopressor   History of GI bleed: -Hb has been stable.    GERD: -Continue with Protonix daily   Hypertension:  -Continue with Lopressor and Norvasc, lisinopril               Consultants: None Procedures performed: None Disposition: Skilled nursing facility Diet recommendation:  Discharge Diet Orders (From admission, onward)     Start     Ordered   04/04/23 0000  Diet - low sodium heart healthy        04/04/23 1102           Regular diet DISCHARGE MEDICATION: Allergies as of 04/04/2023       Reactions   Codeine Nausea And Vomiting        Medication List     STOP taking these medications    clonazePAM 0.5 MG tablet Commonly known as: KLONOPIN   lisinopril 40 MG tablet Commonly known as: ZESTRIL       TAKE these medications    acetaminophen 500 MG tablet Commonly known as: TYLENOL Take  2 tablets (1,000 mg total) by mouth every 6 (six) hours as needed for moderate pain (for pain). What changed: when to take this   amLODipine 10 MG tablet Commonly known as: NORVASC TAKE 1 TABLET(10 MG) BY MOUTH DAILY   cyanocobalamin 1000 MCG tablet Commonly known as: VITAMIN B12 Take 1 tablet (1,000 mcg total) by mouth daily.   diclofenac sodium 1 % Gel Commonly known as: VOLTAREN Apply 2 g topically See admin instructions. Apply 2 grams to both knees for pain four times a day What changed:  how  much to take when to take this reasons to take this additional instructions   latanoprost 0.005 % ophthalmic solution Commonly known as: XALATAN Place 1 drop into both eyes at bedtime.   metoprolol tartrate 50 MG tablet Commonly known as: LOPRESSOR Take 1 tablet (50 mg total) by mouth 2 (two) times daily.   OLANZapine 2.5 MG tablet Commonly known as: ZYPREXA Take 1 tablet (2.5 mg total) by mouth at bedtime.   pantoprazole 40 MG tablet Commonly known as: PROTONIX TAKE 1 TABLET(40 MG) BY MOUTH DAILY   polyethylene glycol 17 g packet Commonly known as: MIRALAX / GLYCOLAX Take 17 g by mouth as needed for mild constipation or moderate constipation. What changed: Another medication with the same name was added. Make sure you understand how and when to take each.   polyethylene glycol 17 g packet Commonly known as: MiraLax Take 17 g by mouth daily. What changed: You were already taking a medication with the same name, and this prescription was added. Make sure you understand how and when to take each.   thiamine 100 MG tablet Commonly known as: VITAMIN B1 Take 1 tablet (100 mg total) by mouth daily.   triamcinolone cream 0.1 % Commonly known as: KENALOG Apply 1 Application topically 2 (two) times daily. What changed:  when to take this reasons to take this   Vitamin D (Ergocalciferol) 1.25 MG (50000 UNIT) Caps capsule Commonly known as: DRISDOL Take 1 capsule (50,000 Units total) by mouth every 7 (seven) days.        Discharge Exam: Filed Weights   04/01/23 0353 04/02/23 0400 04/03/23 0224  Weight: 63.1 kg 62 kg 63.2 kg   General; NAD  Condition at discharge: stable  The results of significant diagnostics from this hospitalization (including imaging, microbiology, ancillary and laboratory) are listed below for reference.   Imaging Studies: DG Abd 1 View  Result Date: 04/03/2023 CLINICAL DATA:  Increased diarrhea recently. EXAM: ABDOMEN - 1 VIEW COMPARISON:   CT 08/05/2014 FINDINGS: Gas and stool throughout the colon with prominent stool in the rectum. No small or large bowel distention. No radiopaque stones. Surgical clips in the right upper quadrant. Vascular calcifications. Degenerative changes in the spine and hips. Old fracture deformity of the left inferior and superior pubic rami. Soft tissue contours appear intact. IMPRESSION: Nonobstructive bowel gas pattern with stool-filled colon. Electronically Signed   By: Burman Nieves M.D.   On: 04/03/2023 16:46   CT HEAD WO CONTRAST ( )  Result Date: 04/01/2023 CLINICAL DATA:  Mental status change, stroke suspected EXAM: CT HEAD WITHOUT CONTRAST TECHNIQUE: Contiguous axial images were obtained from the base of the skull through the vertex without intravenous contrast. RADIATION DOSE REDUCTION: This exam was performed according to the departmental dose-optimization program which includes automated exposure control, adjustment of the mA and/or kV according to patient size and/or use of iterative reconstruction technique. COMPARISON:  02/13/2005 CT head FINDINGS: Brain: No evidence of  acute infarction, hemorrhage, mass, mass effect, or midline shift. No hydrocephalus or extra-axial fluid collection. Periventricular white matter changes, likely the sequela of chronic small vessel ischemic disease. Age related cerebral volume loss. Vascular: No hyperdense vessel. Atherosclerotic calcifications in the intracranial carotid and vertebral arteries. Skull: Negative for fracture or focal lesion. Sinuses/Orbits: No acute finding. Other: The mastoid air cells are well aerated. IMPRESSION: No acute intracranial process. Electronically Signed   By: Wiliam Ke M.D.   On: 04/01/2023 16:43   ECHOCARDIOGRAM COMPLETE  Result Date: 04/01/2023    ECHOCARDIOGRAM REPORT   Patient Name:   SHAKEITA VANDEVANDER [facility] Date of Exam: 04/01/2023 Medical Rec #:  [id number]        Height:       67.0 in Accession #:    [id number]       Weight:        139.1 lb Date of Birth:  10-08-1921         BSA:          1.733 m Patient Age:    89 years         BP:           176/91 mmHg Patient Gender: F                HR:           100 bpm. Exam Location:  Inpatient Procedure: 2D Echo, Cardiac Doppler and Color Doppler Indications:    Atrial Fibrillation  History:        Patient has prior history of Echocardiogram examinations, most                 recent 08/04/2014. Risk Factors:Hypertension.  Sonographer:    Karma Ganja Referring Phys: [id number] SUBRINA SUNDIL  Sonographer Comments: Image acquisition challenging due to patient body habitus. IMPRESSIONS  1. Left ventricular ejection fraction, by estimation, is 60 to 65%. The left ventricle has normal function. The left ventricle has no regional wall motion abnormalities. There is moderate concentric left ventricular hypertrophy. Left ventricular diastolic parameters are indeterminate. Elevated left ventricular end-diastolic pressure.  2. Right ventricular systolic function is normal. The right ventricular size is normal. Tricuspid regurgitation signal is inadequate for assessing PA pressure.  3. The mitral valve is normal in structure. Trivial mitral valve regurgitation. No evidence of mitral stenosis.  4. The aortic valve is tricuspid. Aortic valve regurgitation is trivial. Aortic valve sclerosis is present, with no evidence of aortic valve stenosis. Aortic valve area, by VTI measures 2.58 cm. Aortic valve mean gradient measures 4.0 mmHg. Aortic valve Vmax measures 1.29 m/s.  5. The inferior vena cava is normal in size with <50% respiratory variability, suggesting right atrial pressure of 8 mmHg. FINDINGS  Left Ventricle: Left ventricular ejection fraction, by estimation, is 60 to 65%. The left ventricle has normal function. The left ventricle has no regional wall motion abnormalities. The left ventricular internal cavity size was normal in size. There is  moderate concentric left ventricular hypertrophy. Left ventricular  diastolic parameters are indeterminate. Elevated left ventricular end-diastolic pressure. Right Ventricle: The right ventricular size is normal. No increase in right ventricular wall thickness. Right ventricular systolic function is normal. Tricuspid regurgitation signal is inadequate for assessing PA pressure. Left Atrium: Left atrial size was normal in size. Right Atrium: Right atrial size was normal in size. Pericardium: There is no evidence of pericardial effusion. Mitral Valve: The mitral valve is normal in structure. Trivial mitral valve regurgitation. No evidence of mitral  valve stenosis. Tricuspid Valve: The tricuspid valve is normal in structure. Tricuspid valve regurgitation is trivial. No evidence of tricuspid stenosis. Aortic Valve: The aortic valve is tricuspid. Aortic valve regurgitation is trivial. Aortic valve sclerosis is present, with no evidence of aortic valve stenosis. Aortic valve mean gradient measures 4.0 mmHg. Aortic valve peak gradient measures 6.7 mmHg. Aortic valve area, by VTI measures 2.58 cm. Pulmonic Valve: The pulmonic valve was normal in structure. Pulmonic valve regurgitation is trivial. No evidence of pulmonic stenosis. Aorta: The aortic root is normal in size and structure. Venous: The inferior vena cava is normal in size with less than 50% respiratory variability, suggesting right atrial pressure of 8 mmHg. IAS/Shunts: No atrial level shunt detected by color flow Doppler.  LEFT VENTRICLE PLAX 2D LVIDd:         3.10 cm   Diastology LVIDs:         2.10 cm   LV e' medial:    3.66 cm/s LV PW:         1.50 cm   LV E/e' medial:  28.3 LV IVS:        1.30 cm   LV e' lateral:   5.93 cm/s LVOT diam:     2.00 cm   LV E/e' lateral: 17.5 LV SV:         56 LV SV Index:   32 LVOT Area:     3.14 cm  RIGHT VENTRICLE             IVC RV Basal diam:  3.30 cm     IVC diam: 1.50 cm RV S prime:     12.10 cm/s TAPSE (M-mode): 2.5 cm LEFT ATRIUM             Index        RIGHT ATRIUM           Index LA  diam:        3.70 cm 2.13 cm/m   RA Area:     12.20 cm LA Vol (A2C):   41.7 ml 24.06 ml/m  RA Volume:   28.00 ml  16.16 ml/m LA Vol (A4C):   47.5 ml 27.41 ml/m LA Biplane Vol: 46.4 ml 26.77 ml/m  AORTIC VALVE AV Area (Vmax):    2.68 cm AV Area (Vmean):   2.58 cm AV Area (VTI):     2.58 cm AV Vmax:           129.00 cm/s AV Vmean:          89.600 cm/s AV VTI:            0.217 m AV Peak Grad:      6.7 mmHg AV Mean Grad:      4.0 mmHg LVOT Vmax:         110.00 cm/s LVOT Vmean:        73.700 cm/s LVOT VTI:          0.178 m LVOT/AV VTI ratio: 0.82  AORTA Ao Root diam: 3.10 cm Ao Asc diam:  2.90 cm MITRAL VALVE MV Area (PHT): 3.59 cm     SHUNTS MV Decel Time: 212 msec     Systemic VTI:  0.18 m MV E velocity: 103.50 cm/s  Systemic Diam: 2.00 cm MV A velocity: 97.30 cm/s MV E/A ratio:  1.06 Armanda Magic MD Electronically signed by Armanda Magic MD Signature Date/Time: 04/01/2023/9:58:38 AM    Final    DG Chest Portable 1 View  Result Date: 03/31/2023  CLINICAL DATA:  Lower extremity weakness and hypertension. Suspected pneumonia. EXAM: PORTABLE CHEST 1 VIEW COMPARISON:  AP and lateral 02/19/2018 FINDINGS: There is mild cardiomegaly but no evidence of CHF. Stable mediastinum with aortic tortuosity and patchy calcification. Low inspiration noted on exam. No focal pneumonia is evident allowing for low volumes. No substantial pleural effusion. There is osteopenia. Degenerative changes of the spine and both shoulders. Patient's chin partially obscures the right apex. Patient is rotated to the right. IMPRESSION: 1. Low inspiration without evidence of acute chest disease. 2. Mild cardiomegaly. 3. Aortic atherosclerosis. Electronically Signed   By: [name] M.D.   On: 03/31/2023 22:04    Microbiology: Results for orders placed or performed during the hospital encounter of 02/19/18  Urine culture     Status: Abnormal   Collection Time: 02/19/18 12:54 PM   Specimen: Urine, Clean Catch  Result Value Ref Range  Status   Specimen Description URINE, CLEAN CATCH  Final   Special Requests   Final    NONE Performed at [facility], [address], [place], [place] [address]    Culture MULTIPLE SPECIES PRESENT, SUGGEST RECOLLECTION (A)  Final   Report Status 02/20/2018 FINAL  Final    Labs: CBC: Recent Labs  Lab 03/31/23 1804 04/01/23 0225  WBC 9.3 7.8  HGB 12.7 12.2  HCT 40.9 38.8  MCV 83.1 82.7  PLT 333 318   Basic Metabolic Panel: Recent Labs  Lab 03/31/23 1804 04/01/23 0225 04/03/23 1108 04/04/23 0607  NA 137 138 138 138  K 4.2 4.5 3.9 3.5  CL 105 108 105 107  CO2 20* 21* 23 24  GLUCOSE 119* 96 167* 107*  BUN 14 10 18 23   CREATININE 0.99 0.72 1.04* 1.09*  CALCIUM 9.0 8.4* 8.6* 8.3*  MG  --   --  2.1  --    Liver Function Tests: Recent Labs  Lab 04/01/23 0225  AST 25  ALT 11  ALKPHOS 92  BILITOT 1.4*  PROT 6.4*  ALBUMIN 3.1*   CBG: Recent Labs  Lab 03/31/23 1801  GLUCAP 126*    Discharge time spent: greater than 30 minutes.  Signed: Alba Cory, MD [facility] 04/04/2023

## 2023-04-04 NOTE — TOC Transition Note (Signed)
Transition of Care [facility]) - CM/SW Discharge Note   Patient Details  Name: [name] MRN: [id number] Date of Birth: Mar 07, 1922  [facility] CM/SW Contact:  Maisa Bedingfield A [place], Theresia Majors Phone Number: 04/04/2023, 1:25 PM   Clinical Narrative:     Patient will DC to: [facility]  Anticipated DC date: 04/04/23  Family notified: Katherine Mantle  Transport by: Sharin Mons      Per MD patient ready for DC to [facility] . RN, patient, patient's family, and facility notified of DC. Discharge Summary and FL2 sent to facility. RN to call report prior to discharge [phone], [phone]). DC packet on chart. Ambulance transport requested for patient.     CSW will sign off for now as social work intervention is no longer needed. Please consult [place] again if new needs arise.   Final next level of care: Skilled Nursing Facility Barriers to Discharge: Barriers Resolved   Patient Goals and CMS Choice      Discharge Placement                Patient chooses bed at: [facility] Patient to be transferred to facility by: [facility] Name of family member notified: Indigo Barbian Patient and family notified of of transfer: 04/04/23  Discharge Plan and Services Additional resources added to the After Visit Summary for                                       Social Determinants of Health (SDOH) Interventions SDOH Screenings   Food Insecurity: No Food Insecurity (08/18/2017)  Transportation Needs: No Transportation Needs (08/18/2017)  Depression (PHQ2-9): Medium Risk (11/01/2021)  Financial Resource Strain: Low Risk  (08/18/2017)  Physical Activity: Inactive (08/18/2017)  Social Connections: Moderately Integrated (08/18/2017)  Stress: No Stress Concern Present (08/18/2017)  Tobacco Use: Low Risk  (04/01/2023)     Readmission Risk Interventions     No data to display

## 2023-04-07 DIAGNOSIS — I639 Cerebral infarction, unspecified: Secondary | ICD-10-CM | POA: Diagnosis not present

## 2023-04-07 DIAGNOSIS — I480 Paroxysmal atrial fibrillation: Secondary | ICD-10-CM | POA: Diagnosis not present

## 2023-04-07 DIAGNOSIS — K2100 Gastro-esophageal reflux disease with esophagitis, without bleeding: Secondary | ICD-10-CM | POA: Diagnosis not present

## 2023-04-07 DIAGNOSIS — I10 Essential (primary) hypertension: Secondary | ICD-10-CM | POA: Diagnosis not present

## 2023-04-07 LAB — VITAMIN B1: Vitamin B1 (Thiamine): 81.9 nmol/L (ref 66.5–200.0)

## 2023-04-08 ENCOUNTER — Other Ambulatory Visit: Payer: Self-pay | Admitting: *Deleted

## 2023-04-08 NOTE — Patient Outreach (Signed)
Mrs. Doreatha Massed recently admitted to [facility] SNF under [facility] waiver. Screening for potential chronic care management services as a benefit of health plan and primary care provider.  Secure communication sent to Deseree, SNF social worker, to make aware writer is following for transition plans and potential CCM needs.  [name], MSN, RN, BSN [place]  [facility], [facility] RN Post- Acute Care Coordinator Direct Dial: [phone]

## 2023-04-10 DIAGNOSIS — R5381 Other malaise: Secondary | ICD-10-CM | POA: Diagnosis not present

## 2023-04-10 DIAGNOSIS — N1831 Chronic kidney disease, stage 3a: Secondary | ICD-10-CM | POA: Diagnosis not present

## 2023-04-10 DIAGNOSIS — I10 Essential (primary) hypertension: Secondary | ICD-10-CM | POA: Diagnosis not present

## 2023-04-21 ENCOUNTER — Other Ambulatory Visit: Payer: Self-pay | Admitting: *Deleted

## 2023-04-21 NOTE — Patient Outreach (Signed)
Post- Acute Care Manager follow up. Mrs. [name] resides in [place] skilled nursing facility.  Screening for potential chronic care management services as a benefit of health plan and primary care provider.  Telephonic collaboration meeting with Deseree, Child psychotherapist. [name] is slated to transition to [place] for LTC later this week.   Will follow to confirm transition date.   [name], MSN, RN, BSN [place]  [facility], [facility] Commun[facility] RN Post- Acute Care Manager Direct Dial: [phone]

## 2023-04-25 ENCOUNTER — Telehealth: Payer: Self-pay

## 2023-04-25 ENCOUNTER — Other Ambulatory Visit: Payer: Self-pay | Admitting: *Deleted

## 2023-04-25 DIAGNOSIS — I150 Renovascular hypertension: Secondary | ICD-10-CM | POA: Diagnosis not present

## 2023-04-25 DIAGNOSIS — I480 Paroxysmal atrial fibrillation: Secondary | ICD-10-CM | POA: Diagnosis not present

## 2023-04-25 DIAGNOSIS — I471 Supraventricular tachycardia, unspecified: Secondary | ICD-10-CM | POA: Diagnosis not present

## 2023-04-25 DIAGNOSIS — Z8673 Personal history of transient ischemic attack (TIA), and cerebral infarction without residual deficits: Secondary | ICD-10-CM | POA: Diagnosis not present

## 2023-04-25 DIAGNOSIS — I639 Cerebral infarction, unspecified: Secondary | ICD-10-CM | POA: Diagnosis not present

## 2023-04-25 DIAGNOSIS — R1310 Dysphagia, unspecified: Secondary | ICD-10-CM | POA: Diagnosis not present

## 2023-04-25 DIAGNOSIS — I872 Venous insufficiency (chronic) (peripheral): Secondary | ICD-10-CM | POA: Diagnosis not present

## 2023-04-25 DIAGNOSIS — R262 Difficulty in walking, not elsewhere classified: Secondary | ICD-10-CM | POA: Diagnosis not present

## 2023-04-25 DIAGNOSIS — R5381 Other malaise: Secondary | ICD-10-CM | POA: Diagnosis not present

## 2023-04-25 DIAGNOSIS — Z79899 Other long term (current) drug therapy: Secondary | ICD-10-CM | POA: Diagnosis not present

## 2023-04-25 DIAGNOSIS — F0390 Unspecified dementia without behavioral disturbance: Secondary | ICD-10-CM | POA: Diagnosis not present

## 2023-04-25 DIAGNOSIS — M6281 Muscle weakness (generalized): Secondary | ICD-10-CM | POA: Diagnosis not present

## 2023-04-25 DIAGNOSIS — N183 Chronic kidney disease, stage 3 unspecified: Secondary | ICD-10-CM | POA: Diagnosis not present

## 2023-04-25 DIAGNOSIS — M179 Osteoarthritis of knee, unspecified: Secondary | ICD-10-CM | POA: Diagnosis not present

## 2023-04-25 DIAGNOSIS — R296 Repeated falls: Secondary | ICD-10-CM | POA: Diagnosis not present

## 2023-04-25 NOTE — Patient Outreach (Signed)
Per [facility] [name] discharged from [facility] skilled nursing facility on 04/24/23. Screening for potential complex care management services as benefit of health plan and Primary Care Provider. [name] utilized the [facility] SN[facility] waiver for admission to [facility].  [name] transitioned to [place] for LTC per [employer].   No identifiable complex case management needs. Writer will sign off.   [name], MSN, RN, BSN [place]  [facility], [facility] RN Post- Acute Care Manager Direct Dial: [phone]

## 2023-04-25 NOTE — Transitions of Care (Post Inpatient/ED Visit) (Unsigned)
   04/25/2023  Name: ELETA KOLBE MRN: [id number] DOB: 05/14/1922  Today's TOC FU Call Status: Today's TOC FU Call Status:: Unsuccessful Call (1st Attempt) Unsuccessful Call (1st Attempt) Date: 04/25/23  Attempted to reach the patient regarding the most recent Inpatient/ED visit.  Follow Up Plan: Additional outreach attempts will be made to reach the patient to complete the Transitions of Care (Post Inpatient/ED visit) call.   Signature Karena Addison, LPN [facility] Nurse Health Advisor Direct Dial [phone]

## 2023-04-27 DIAGNOSIS — R296 Repeated falls: Secondary | ICD-10-CM | POA: Diagnosis not present

## 2023-04-27 DIAGNOSIS — R1310 Dysphagia, unspecified: Secondary | ICD-10-CM | POA: Diagnosis not present

## 2023-04-27 DIAGNOSIS — R262 Difficulty in walking, not elsewhere classified: Secondary | ICD-10-CM | POA: Diagnosis not present

## 2023-04-27 DIAGNOSIS — M6281 Muscle weakness (generalized): Secondary | ICD-10-CM | POA: Diagnosis not present

## 2023-04-27 DIAGNOSIS — I639 Cerebral infarction, unspecified: Secondary | ICD-10-CM | POA: Diagnosis not present

## 2023-04-28 DIAGNOSIS — R262 Difficulty in walking, not elsewhere classified: Secondary | ICD-10-CM | POA: Diagnosis not present

## 2023-04-28 DIAGNOSIS — M6281 Muscle weakness (generalized): Secondary | ICD-10-CM | POA: Diagnosis not present

## 2023-04-28 DIAGNOSIS — I639 Cerebral infarction, unspecified: Secondary | ICD-10-CM | POA: Diagnosis not present

## 2023-04-28 DIAGNOSIS — R296 Repeated falls: Secondary | ICD-10-CM | POA: Diagnosis not present

## 2023-04-28 DIAGNOSIS — R1310 Dysphagia, unspecified: Secondary | ICD-10-CM | POA: Diagnosis not present

## 2023-04-29 DIAGNOSIS — R1310 Dysphagia, unspecified: Secondary | ICD-10-CM | POA: Diagnosis not present

## 2023-04-29 DIAGNOSIS — I639 Cerebral infarction, unspecified: Secondary | ICD-10-CM | POA: Diagnosis not present

## 2023-04-29 DIAGNOSIS — R296 Repeated falls: Secondary | ICD-10-CM | POA: Diagnosis not present

## 2023-04-29 DIAGNOSIS — M6281 Muscle weakness (generalized): Secondary | ICD-10-CM | POA: Diagnosis not present

## 2023-04-29 DIAGNOSIS — R262 Difficulty in walking, not elsewhere classified: Secondary | ICD-10-CM | POA: Diagnosis not present

## 2023-04-29 NOTE — Transitions of Care (Post Inpatient/ED Visit) (Unsigned)
   04/29/2023  Name: [name] MRN: [id number] DOB: 16-Jun-1921  Today's TOC FU Call Status: Today's TOC FU Call Status:: Unsuccessful Call (2nd Attempt) Unsuccessful Call (1st Attempt) Date: 04/25/23 Unsuccessful Call (2nd Attempt) Date: 04/29/23  Attempted to reach the patient regarding the most recent Inpatient/ED visit.  Follow Up Plan: Additional outreach attempts will be made to reach the patient to complete the Transitions of Care (Post Inpatient/ED visit) call.   Signature Karena Addison, LPN [facility] Nurse Health Advisor Direct Dial [phone]

## 2023-04-30 DIAGNOSIS — R1310 Dysphagia, unspecified: Secondary | ICD-10-CM | POA: Diagnosis not present

## 2023-04-30 DIAGNOSIS — R262 Difficulty in walking, not elsewhere classified: Secondary | ICD-10-CM | POA: Diagnosis not present

## 2023-04-30 DIAGNOSIS — R296 Repeated falls: Secondary | ICD-10-CM | POA: Diagnosis not present

## 2023-04-30 DIAGNOSIS — M6281 Muscle weakness (generalized): Secondary | ICD-10-CM | POA: Diagnosis not present

## 2023-04-30 DIAGNOSIS — I639 Cerebral infarction, unspecified: Secondary | ICD-10-CM | POA: Diagnosis not present

## 2023-04-30 NOTE — Transitions of Care (Post Inpatient/ED Visit) (Signed)
   04/30/2023  Name: [name] MRN: [id number] DOB: Jul 18, 1921  Today's TOC FU Call Status: Today's TOC FU Call Status:: Unsuccessful Call (3rd Attempt) Unsuccessful Call (1st Attempt) Date: 04/25/23 Unsuccessful Call (2nd Attempt) Date: 04/29/23 Unsuccessful Call (3rd Attempt) Date: 04/30/23  Attempted to reach the patient regarding the most recent Inpatient/ED visit.  Follow Up Plan: No further outreach attempts will be made at this time. We have been unable to contact the patient.  Signature Karena Addison, LPN [facility] Nurse Health Advisor Direct Dial [phone]

## 2023-05-02 DIAGNOSIS — R262 Difficulty in walking, not elsewhere classified: Secondary | ICD-10-CM | POA: Diagnosis not present

## 2023-05-02 DIAGNOSIS — R1310 Dysphagia, unspecified: Secondary | ICD-10-CM | POA: Diagnosis not present

## 2023-05-02 DIAGNOSIS — R296 Repeated falls: Secondary | ICD-10-CM | POA: Diagnosis not present

## 2023-05-02 DIAGNOSIS — I639 Cerebral infarction, unspecified: Secondary | ICD-10-CM | POA: Diagnosis not present

## 2023-05-02 DIAGNOSIS — M6281 Muscle weakness (generalized): Secondary | ICD-10-CM | POA: Diagnosis not present

## 2023-05-05 DIAGNOSIS — R296 Repeated falls: Secondary | ICD-10-CM | POA: Diagnosis not present

## 2023-05-05 DIAGNOSIS — I639 Cerebral infarction, unspecified: Secondary | ICD-10-CM | POA: Diagnosis not present

## 2023-05-05 DIAGNOSIS — I480 Paroxysmal atrial fibrillation: Secondary | ICD-10-CM | POA: Diagnosis not present

## 2023-05-05 DIAGNOSIS — M25531 Pain in right wrist: Secondary | ICD-10-CM | POA: Diagnosis not present

## 2023-05-05 DIAGNOSIS — N183 Chronic kidney disease, stage 3 unspecified: Secondary | ICD-10-CM | POA: Diagnosis not present

## 2023-05-05 DIAGNOSIS — M79641 Pain in right hand: Secondary | ICD-10-CM | POA: Diagnosis not present

## 2023-05-05 DIAGNOSIS — M6281 Muscle weakness (generalized): Secondary | ICD-10-CM | POA: Diagnosis not present

## 2023-05-05 DIAGNOSIS — R262 Difficulty in walking, not elsewhere classified: Secondary | ICD-10-CM | POA: Diagnosis not present

## 2023-05-06 DIAGNOSIS — I480 Paroxysmal atrial fibrillation: Secondary | ICD-10-CM | POA: Diagnosis not present

## 2023-05-06 DIAGNOSIS — I639 Cerebral infarction, unspecified: Secondary | ICD-10-CM | POA: Diagnosis not present

## 2023-05-06 DIAGNOSIS — N183 Chronic kidney disease, stage 3 unspecified: Secondary | ICD-10-CM | POA: Diagnosis not present

## 2023-05-06 DIAGNOSIS — S62306A Unspecified fracture of fifth metacarpal bone, right hand, initial encounter for closed fracture: Secondary | ICD-10-CM | POA: Diagnosis not present

## 2023-05-06 DIAGNOSIS — M6281 Muscle weakness (generalized): Secondary | ICD-10-CM | POA: Diagnosis not present

## 2023-05-06 DIAGNOSIS — R262 Difficulty in walking, not elsewhere classified: Secondary | ICD-10-CM | POA: Diagnosis not present

## 2023-05-06 DIAGNOSIS — R296 Repeated falls: Secondary | ICD-10-CM | POA: Diagnosis not present

## 2023-05-07 DIAGNOSIS — M6281 Muscle weakness (generalized): Secondary | ICD-10-CM | POA: Diagnosis not present

## 2023-05-07 DIAGNOSIS — N183 Chronic kidney disease, stage 3 unspecified: Secondary | ICD-10-CM | POA: Diagnosis not present

## 2023-05-07 DIAGNOSIS — S62339D Displaced fracture of neck of unspecified metacarpal bone, subsequent encounter for fracture with routine healing: Secondary | ICD-10-CM | POA: Diagnosis not present

## 2023-05-07 DIAGNOSIS — I639 Cerebral infarction, unspecified: Secondary | ICD-10-CM | POA: Diagnosis not present

## 2023-05-07 DIAGNOSIS — I480 Paroxysmal atrial fibrillation: Secondary | ICD-10-CM | POA: Diagnosis not present

## 2023-05-07 DIAGNOSIS — R296 Repeated falls: Secondary | ICD-10-CM | POA: Diagnosis not present

## 2023-05-07 DIAGNOSIS — R262 Difficulty in walking, not elsewhere classified: Secondary | ICD-10-CM | POA: Diagnosis not present

## 2023-05-08 DIAGNOSIS — I480 Paroxysmal atrial fibrillation: Secondary | ICD-10-CM | POA: Diagnosis not present

## 2023-05-08 DIAGNOSIS — R296 Repeated falls: Secondary | ICD-10-CM | POA: Diagnosis not present

## 2023-05-08 DIAGNOSIS — M6281 Muscle weakness (generalized): Secondary | ICD-10-CM | POA: Diagnosis not present

## 2023-05-08 DIAGNOSIS — I639 Cerebral infarction, unspecified: Secondary | ICD-10-CM | POA: Diagnosis not present

## 2023-05-08 DIAGNOSIS — R262 Difficulty in walking, not elsewhere classified: Secondary | ICD-10-CM | POA: Diagnosis not present

## 2023-05-08 DIAGNOSIS — N183 Chronic kidney disease, stage 3 unspecified: Secondary | ICD-10-CM | POA: Diagnosis not present

## 2023-05-09 DIAGNOSIS — I480 Paroxysmal atrial fibrillation: Secondary | ICD-10-CM | POA: Diagnosis not present

## 2023-05-09 DIAGNOSIS — I639 Cerebral infarction, unspecified: Secondary | ICD-10-CM | POA: Diagnosis not present

## 2023-05-09 DIAGNOSIS — R296 Repeated falls: Secondary | ICD-10-CM | POA: Diagnosis not present

## 2023-05-09 DIAGNOSIS — M6281 Muscle weakness (generalized): Secondary | ICD-10-CM | POA: Diagnosis not present

## 2023-05-09 DIAGNOSIS — R262 Difficulty in walking, not elsewhere classified: Secondary | ICD-10-CM | POA: Diagnosis not present

## 2023-05-09 DIAGNOSIS — N183 Chronic kidney disease, stage 3 unspecified: Secondary | ICD-10-CM | POA: Diagnosis not present

## 2023-05-11 DIAGNOSIS — M6281 Muscle weakness (generalized): Secondary | ICD-10-CM | POA: Diagnosis not present

## 2023-05-11 DIAGNOSIS — I480 Paroxysmal atrial fibrillation: Secondary | ICD-10-CM | POA: Diagnosis not present

## 2023-05-11 DIAGNOSIS — R296 Repeated falls: Secondary | ICD-10-CM | POA: Diagnosis not present

## 2023-05-11 DIAGNOSIS — N183 Chronic kidney disease, stage 3 unspecified: Secondary | ICD-10-CM | POA: Diagnosis not present

## 2023-05-11 DIAGNOSIS — I639 Cerebral infarction, unspecified: Secondary | ICD-10-CM | POA: Diagnosis not present

## 2023-05-11 DIAGNOSIS — R262 Difficulty in walking, not elsewhere classified: Secondary | ICD-10-CM | POA: Diagnosis not present

## 2023-05-12 DIAGNOSIS — I480 Paroxysmal atrial fibrillation: Secondary | ICD-10-CM | POA: Diagnosis not present

## 2023-05-12 DIAGNOSIS — N183 Chronic kidney disease, stage 3 unspecified: Secondary | ICD-10-CM | POA: Diagnosis not present

## 2023-05-12 DIAGNOSIS — R262 Difficulty in walking, not elsewhere classified: Secondary | ICD-10-CM | POA: Diagnosis not present

## 2023-05-12 DIAGNOSIS — I639 Cerebral infarction, unspecified: Secondary | ICD-10-CM | POA: Diagnosis not present

## 2023-05-12 DIAGNOSIS — M6281 Muscle weakness (generalized): Secondary | ICD-10-CM | POA: Diagnosis not present

## 2023-05-12 DIAGNOSIS — R296 Repeated falls: Secondary | ICD-10-CM | POA: Diagnosis not present

## 2023-05-13 DIAGNOSIS — R262 Difficulty in walking, not elsewhere classified: Secondary | ICD-10-CM | POA: Diagnosis not present

## 2023-05-13 DIAGNOSIS — M6281 Muscle weakness (generalized): Secondary | ICD-10-CM | POA: Diagnosis not present

## 2023-05-13 DIAGNOSIS — R296 Repeated falls: Secondary | ICD-10-CM | POA: Diagnosis not present

## 2023-05-13 DIAGNOSIS — I639 Cerebral infarction, unspecified: Secondary | ICD-10-CM | POA: Diagnosis not present

## 2023-05-13 DIAGNOSIS — I480 Paroxysmal atrial fibrillation: Secondary | ICD-10-CM | POA: Diagnosis not present

## 2023-05-13 DIAGNOSIS — N183 Chronic kidney disease, stage 3 unspecified: Secondary | ICD-10-CM | POA: Diagnosis not present

## 2023-05-14 DIAGNOSIS — I480 Paroxysmal atrial fibrillation: Secondary | ICD-10-CM | POA: Diagnosis not present

## 2023-05-14 DIAGNOSIS — R296 Repeated falls: Secondary | ICD-10-CM | POA: Diagnosis not present

## 2023-05-14 DIAGNOSIS — I639 Cerebral infarction, unspecified: Secondary | ICD-10-CM | POA: Diagnosis not present

## 2023-05-14 DIAGNOSIS — M6281 Muscle weakness (generalized): Secondary | ICD-10-CM | POA: Diagnosis not present

## 2023-05-14 DIAGNOSIS — R262 Difficulty in walking, not elsewhere classified: Secondary | ICD-10-CM | POA: Diagnosis not present

## 2023-05-14 DIAGNOSIS — N183 Chronic kidney disease, stage 3 unspecified: Secondary | ICD-10-CM | POA: Diagnosis not present

## 2023-05-15 DIAGNOSIS — R296 Repeated falls: Secondary | ICD-10-CM | POA: Diagnosis not present

## 2023-05-15 DIAGNOSIS — I480 Paroxysmal atrial fibrillation: Secondary | ICD-10-CM | POA: Diagnosis not present

## 2023-05-15 DIAGNOSIS — R262 Difficulty in walking, not elsewhere classified: Secondary | ICD-10-CM | POA: Diagnosis not present

## 2023-05-15 DIAGNOSIS — M6281 Muscle weakness (generalized): Secondary | ICD-10-CM | POA: Diagnosis not present

## 2023-05-15 DIAGNOSIS — I639 Cerebral infarction, unspecified: Secondary | ICD-10-CM | POA: Diagnosis not present

## 2023-05-15 DIAGNOSIS — N183 Chronic kidney disease, stage 3 unspecified: Secondary | ICD-10-CM | POA: Diagnosis not present

## 2023-05-16 DIAGNOSIS — N183 Chronic kidney disease, stage 3 unspecified: Secondary | ICD-10-CM | POA: Diagnosis not present

## 2023-05-16 DIAGNOSIS — I872 Venous insufficiency (chronic) (peripheral): Secondary | ICD-10-CM | POA: Diagnosis not present

## 2023-05-16 DIAGNOSIS — R262 Difficulty in walking, not elsewhere classified: Secondary | ICD-10-CM | POA: Diagnosis not present

## 2023-05-16 DIAGNOSIS — R296 Repeated falls: Secondary | ICD-10-CM | POA: Diagnosis not present

## 2023-05-16 DIAGNOSIS — I639 Cerebral infarction, unspecified: Secondary | ICD-10-CM | POA: Diagnosis not present

## 2023-05-16 DIAGNOSIS — I480 Paroxysmal atrial fibrillation: Secondary | ICD-10-CM | POA: Diagnosis not present

## 2023-05-16 DIAGNOSIS — R600 Localized edema: Secondary | ICD-10-CM | POA: Diagnosis not present

## 2023-05-16 DIAGNOSIS — I150 Renovascular hypertension: Secondary | ICD-10-CM | POA: Diagnosis not present

## 2023-05-16 DIAGNOSIS — M6281 Muscle weakness (generalized): Secondary | ICD-10-CM | POA: Diagnosis not present

## 2023-05-19 ENCOUNTER — Ambulatory Visit: Payer: Medicare Other | Admitting: Internal Medicine

## 2023-05-19 DIAGNOSIS — M6281 Muscle weakness (generalized): Secondary | ICD-10-CM | POA: Diagnosis not present

## 2023-05-19 DIAGNOSIS — I480 Paroxysmal atrial fibrillation: Secondary | ICD-10-CM | POA: Diagnosis not present

## 2023-05-19 DIAGNOSIS — I639 Cerebral infarction, unspecified: Secondary | ICD-10-CM | POA: Diagnosis not present

## 2023-05-19 DIAGNOSIS — N183 Chronic kidney disease, stage 3 unspecified: Secondary | ICD-10-CM | POA: Diagnosis not present

## 2023-05-19 DIAGNOSIS — R262 Difficulty in walking, not elsewhere classified: Secondary | ICD-10-CM | POA: Diagnosis not present

## 2023-05-19 DIAGNOSIS — R296 Repeated falls: Secondary | ICD-10-CM | POA: Diagnosis not present

## 2023-05-19 DIAGNOSIS — I10 Essential (primary) hypertension: Secondary | ICD-10-CM | POA: Diagnosis not present

## 2023-05-20 DIAGNOSIS — I480 Paroxysmal atrial fibrillation: Secondary | ICD-10-CM | POA: Diagnosis not present

## 2023-05-20 DIAGNOSIS — I639 Cerebral infarction, unspecified: Secondary | ICD-10-CM | POA: Diagnosis not present

## 2023-05-20 DIAGNOSIS — N183 Chronic kidney disease, stage 3 unspecified: Secondary | ICD-10-CM | POA: Diagnosis not present

## 2023-05-20 DIAGNOSIS — S62306D Unspecified fracture of fifth metacarpal bone, right hand, subsequent encounter for fracture with routine healing: Secondary | ICD-10-CM | POA: Diagnosis not present

## 2023-05-20 DIAGNOSIS — M6281 Muscle weakness (generalized): Secondary | ICD-10-CM | POA: Diagnosis not present

## 2023-05-20 DIAGNOSIS — R262 Difficulty in walking, not elsewhere classified: Secondary | ICD-10-CM | POA: Diagnosis not present

## 2023-05-20 DIAGNOSIS — R296 Repeated falls: Secondary | ICD-10-CM | POA: Diagnosis not present

## 2023-05-21 DIAGNOSIS — I639 Cerebral infarction, unspecified: Secondary | ICD-10-CM | POA: Diagnosis not present

## 2023-05-21 DIAGNOSIS — R296 Repeated falls: Secondary | ICD-10-CM | POA: Diagnosis not present

## 2023-05-21 DIAGNOSIS — N183 Chronic kidney disease, stage 3 unspecified: Secondary | ICD-10-CM | POA: Diagnosis not present

## 2023-05-21 DIAGNOSIS — M6281 Muscle weakness (generalized): Secondary | ICD-10-CM | POA: Diagnosis not present

## 2023-05-21 DIAGNOSIS — I480 Paroxysmal atrial fibrillation: Secondary | ICD-10-CM | POA: Diagnosis not present

## 2023-05-21 DIAGNOSIS — R262 Difficulty in walking, not elsewhere classified: Secondary | ICD-10-CM | POA: Diagnosis not present

## 2023-05-22 DIAGNOSIS — R5381 Other malaise: Secondary | ICD-10-CM | POA: Diagnosis not present

## 2023-05-22 DIAGNOSIS — F03B11 Unspecified dementia, moderate, with agitation: Secondary | ICD-10-CM | POA: Diagnosis not present

## 2023-05-26 DIAGNOSIS — I480 Paroxysmal atrial fibrillation: Secondary | ICD-10-CM | POA: Diagnosis not present

## 2023-05-26 DIAGNOSIS — F03B11 Unspecified dementia, moderate, with agitation: Secondary | ICD-10-CM | POA: Diagnosis not present

## 2023-05-26 DIAGNOSIS — S62339D Displaced fracture of neck of unspecified metacarpal bone, subsequent encounter for fracture with routine healing: Secondary | ICD-10-CM | POA: Diagnosis not present

## 2023-05-26 DIAGNOSIS — N183 Chronic kidney disease, stage 3 unspecified: Secondary | ICD-10-CM | POA: Diagnosis not present

## 2023-05-26 DIAGNOSIS — M179 Osteoarthritis of knee, unspecified: Secondary | ICD-10-CM | POA: Diagnosis not present

## 2023-05-26 DIAGNOSIS — R600 Localized edema: Secondary | ICD-10-CM | POA: Diagnosis not present

## 2023-05-26 DIAGNOSIS — I150 Renovascular hypertension: Secondary | ICD-10-CM | POA: Diagnosis not present

## 2023-05-26 DIAGNOSIS — D649 Anemia, unspecified: Secondary | ICD-10-CM | POA: Diagnosis not present

## 2023-05-26 DIAGNOSIS — R7989 Other specified abnormal findings of blood chemistry: Secondary | ICD-10-CM | POA: Diagnosis not present

## 2023-06-02 ENCOUNTER — Encounter: Payer: Self-pay | Admitting: Internal Medicine

## 2023-06-02 DIAGNOSIS — D510 Vitamin B12 deficiency anemia due to intrinsic factor deficiency: Secondary | ICD-10-CM | POA: Diagnosis not present

## 2023-06-02 DIAGNOSIS — I10 Essential (primary) hypertension: Secondary | ICD-10-CM | POA: Diagnosis not present

## 2023-06-02 DIAGNOSIS — D649 Anemia, unspecified: Secondary | ICD-10-CM | POA: Diagnosis not present

## 2023-06-04 DIAGNOSIS — M79605 Pain in left leg: Secondary | ICD-10-CM | POA: Diagnosis not present

## 2023-06-04 DIAGNOSIS — M79604 Pain in right leg: Secondary | ICD-10-CM | POA: Diagnosis not present

## 2023-06-06 DIAGNOSIS — J069 Acute upper respiratory infection, unspecified: Secondary | ICD-10-CM | POA: Diagnosis not present

## 2023-06-06 DIAGNOSIS — I872 Venous insufficiency (chronic) (peripheral): Secondary | ICD-10-CM | POA: Diagnosis not present

## 2023-06-06 DIAGNOSIS — R7989 Other specified abnormal findings of blood chemistry: Secondary | ICD-10-CM | POA: Diagnosis not present

## 2023-06-06 DIAGNOSIS — D510 Vitamin B12 deficiency anemia due to intrinsic factor deficiency: Secondary | ICD-10-CM | POA: Diagnosis not present

## 2023-06-06 DIAGNOSIS — E611 Iron deficiency: Secondary | ICD-10-CM | POA: Diagnosis not present

## 2023-06-10 DIAGNOSIS — R0989 Other specified symptoms and signs involving the circulatory and respiratory systems: Secondary | ICD-10-CM | POA: Diagnosis not present

## 2023-06-11 ENCOUNTER — Ambulatory Visit: Payer: Medicare Other | Admitting: Student

## 2023-06-11 DIAGNOSIS — J069 Acute upper respiratory infection, unspecified: Secondary | ICD-10-CM | POA: Diagnosis not present

## 2023-06-16 DIAGNOSIS — M79641 Pain in right hand: Secondary | ICD-10-CM | POA: Diagnosis not present

## 2023-06-17 DIAGNOSIS — S62306D Unspecified fracture of fifth metacarpal bone, right hand, subsequent encounter for fracture with routine healing: Secondary | ICD-10-CM | POA: Diagnosis not present

## 2023-06-18 ENCOUNTER — Encounter (HOSPITAL_COMMUNITY): Payer: Self-pay | Admitting: Emergency Medicine

## 2023-06-18 ENCOUNTER — Other Ambulatory Visit: Payer: Self-pay

## 2023-06-18 ENCOUNTER — Emergency Department (HOSPITAL_COMMUNITY): Payer: Medicare Other

## 2023-06-18 ENCOUNTER — Inpatient Hospital Stay (HOSPITAL_COMMUNITY)
Admission: EM | Admit: 2023-06-18 | Discharge: 2023-06-23 | DRG: 178 | Disposition: A | Discharge status: Skilled Nursing Facility | Payer: Medicare Other | Source: Skilled Nursing Facility | Attending: Internal Medicine | Admitting: Internal Medicine

## 2023-06-18 DIAGNOSIS — Z885 Allergy status to narcotic agent status: Secondary | ICD-10-CM

## 2023-06-18 DIAGNOSIS — M179 Osteoarthritis of knee, unspecified: Secondary | ICD-10-CM | POA: Diagnosis not present

## 2023-06-18 DIAGNOSIS — I480 Paroxysmal atrial fibrillation: Secondary | ICD-10-CM | POA: Diagnosis not present

## 2023-06-18 DIAGNOSIS — F0390 Unspecified dementia without behavioral disturbance: Secondary | ICD-10-CM | POA: Diagnosis not present

## 2023-06-18 DIAGNOSIS — Z66 Do not resuscitate: Secondary | ICD-10-CM | POA: Diagnosis present

## 2023-06-18 DIAGNOSIS — Z853 Personal history of malignant neoplasm of breast: Secondary | ICD-10-CM

## 2023-06-18 DIAGNOSIS — S6290XD Unspecified fracture of unspecified wrist and hand, subsequent encounter for fracture with routine healing: Secondary | ICD-10-CM | POA: Diagnosis not present

## 2023-06-18 DIAGNOSIS — R918 Other nonspecific abnormal finding of lung field: Secondary | ICD-10-CM | POA: Diagnosis not present

## 2023-06-18 DIAGNOSIS — Z8249 Family history of ischemic heart disease and other diseases of the circulatory system: Secondary | ICD-10-CM | POA: Diagnosis not present

## 2023-06-18 DIAGNOSIS — R531 Weakness: Secondary | ICD-10-CM | POA: Diagnosis not present

## 2023-06-18 DIAGNOSIS — I700 Atherosclerosis of aorta: Secondary | ICD-10-CM | POA: Diagnosis not present

## 2023-06-18 DIAGNOSIS — E876 Hypokalemia: Secondary | ICD-10-CM | POA: Diagnosis present

## 2023-06-18 DIAGNOSIS — R1313 Dysphagia, pharyngeal phase: Secondary | ICD-10-CM | POA: Diagnosis not present

## 2023-06-18 DIAGNOSIS — D649 Anemia, unspecified: Secondary | ICD-10-CM | POA: Diagnosis present

## 2023-06-18 DIAGNOSIS — J690 Pneumonitis due to inhalation of food and vomit: Principal | ICD-10-CM | POA: Diagnosis present

## 2023-06-18 DIAGNOSIS — L89152 Pressure ulcer of sacral region, stage 2: Secondary | ICD-10-CM | POA: Diagnosis present

## 2023-06-18 DIAGNOSIS — I471 Supraventricular tachycardia, unspecified: Secondary | ICD-10-CM | POA: Diagnosis not present

## 2023-06-18 DIAGNOSIS — I872 Venous insufficiency (chronic) (peripheral): Secondary | ICD-10-CM | POA: Diagnosis not present

## 2023-06-18 DIAGNOSIS — Z90710 Acquired absence of both cervix and uterus: Secondary | ICD-10-CM

## 2023-06-18 DIAGNOSIS — Z79899 Other long term (current) drug therapy: Secondary | ICD-10-CM | POA: Diagnosis not present

## 2023-06-18 DIAGNOSIS — Z515 Encounter for palliative care: Secondary | ICD-10-CM

## 2023-06-18 DIAGNOSIS — R5381 Other malaise: Secondary | ICD-10-CM | POA: Diagnosis not present

## 2023-06-18 DIAGNOSIS — E162 Hypoglycemia, unspecified: Secondary | ICD-10-CM | POA: Diagnosis not present

## 2023-06-18 DIAGNOSIS — I10 Essential (primary) hypertension: Secondary | ICD-10-CM | POA: Diagnosis present

## 2023-06-18 DIAGNOSIS — Z7401 Bed confinement status: Secondary | ICD-10-CM | POA: Diagnosis not present

## 2023-06-18 DIAGNOSIS — I129 Hypertensive chronic kidney disease with stage 1 through stage 4 chronic kidney disease, or unspecified chronic kidney disease: Secondary | ICD-10-CM | POA: Diagnosis present

## 2023-06-18 DIAGNOSIS — N183 Chronic kidney disease, stage 3 unspecified: Secondary | ICD-10-CM | POA: Diagnosis not present

## 2023-06-18 DIAGNOSIS — R1310 Dysphagia, unspecified: Principal | ICD-10-CM

## 2023-06-18 DIAGNOSIS — F03911 Unspecified dementia, unspecified severity, with agitation: Secondary | ICD-10-CM | POA: Diagnosis present

## 2023-06-18 DIAGNOSIS — R4182 Altered mental status, unspecified: Secondary | ICD-10-CM | POA: Diagnosis not present

## 2023-06-18 DIAGNOSIS — I150 Renovascular hypertension: Secondary | ICD-10-CM | POA: Diagnosis not present

## 2023-06-18 LAB — COMPREHENSIVE METABOLIC PANEL
ALT: 10 U/L (ref 0–44)
AST: 16 U/L (ref 15–41)
Albumin: 3.4 g/dL — ABNORMAL LOW (ref 3.5–5.0)
Alkaline Phosphatase: 101 U/L (ref 38–126)
Anion gap: 10 (ref 5–15)
BUN: 14 mg/dL (ref 8–23)
CO2: 26 mmol/L (ref 22–32)
Calcium: 9.1 mg/dL (ref 8.9–10.3)
Chloride: 104 mmol/L (ref 98–111)
Creatinine, Ser: 0.74 mg/dL (ref 0.44–1.00)
GFR, Estimated: >60 mL/min (ref >60)
Glucose, Bld: 94 mg/dL (ref 70–99)
Potassium: 3.6 mmol/L (ref 3.5–5.1)
Sodium: 140 mmol/L (ref 135–145)
Total Bilirubin: 0.4 mg/dL (ref 0.0–1.2)
Total Protein: 7.3 g/dL (ref 6.5–8.1)

## 2023-06-18 LAB — CBC WITH DIFFERENTIAL/PLATELET
Abs Immature Granulocytes: 0.03 10*3/uL (ref 0.00–0.07)
Basophils Absolute: 0 10*3/uL (ref 0.0–0.1)
Basophils Relative: 0 %
Eosinophils Absolute: 0.1 10*3/uL (ref 0.0–0.5)
Eosinophils Relative: 1 %
HCT: 38.1 % (ref 36.0–46.0)
Hemoglobin: 11.9 g/dL — ABNORMAL LOW (ref 12.0–15.0)
Immature Granulocytes: 0 %
Lymphocytes Relative: 15 %
Lymphs Abs: 1.1 10*3/uL (ref 0.7–4.0)
MCH: 25.4 pg — ABNORMAL LOW (ref 26.0–34.0)
MCHC: 31.2 g/dL (ref 30.0–36.0)
MCV: 81.2 fL (ref 80.0–100.0)
Monocytes Absolute: 0.6 10*3/uL (ref 0.1–1.0)
Monocytes Relative: 8 %
Neutro Abs: 5.8 10*3/uL (ref 1.7–7.7)
Neutrophils Relative %: 76 %
Platelets: 593 10*3/uL — ABNORMAL HIGH (ref 150–400)
RBC: 4.69 MIL/uL (ref 3.87–5.11)
RDW: 15.2 % (ref 11.5–15.5)
WBC: 7.6 10*3/uL (ref 4.0–10.5)
nRBC: 0 % (ref 0.0–0.2)

## 2023-06-18 LAB — RESP PANEL BY RT-PCR (RSV, FLU A&B, COVID)  RVPGX2
Influenza A by PCR: NEGATIVE
Influenza B by PCR: NEGATIVE
Resp Syncytial Virus by PCR: NEGATIVE
SARS Coronavirus 2 by RT PCR: NEGATIVE

## 2023-06-18 LAB — GROUP A STREP BY PCR: Group A Strep by PCR: NOT DETECTED

## 2023-06-18 LAB — I-STAT CG4 LACTIC ACID, ED: Lactic Acid, Venous: 1 mmol/L (ref 0.5–1.9)

## 2023-06-18 MED ORDER — SODIUM CHLORIDE 0.9 % IV SOLN: 500.0000 mg | Freq: Once | INTRAVENOUS | Status: DC

## 2023-06-18 MED ORDER — SODIUM CHLORIDE 0.9 % IV SOLN
3.0000 g | Freq: Once | INTRAVENOUS | Status: AC
  Administered 2023-06-19: 3 g via INTRAVENOUS
  Filled 2023-06-18: qty 8

## 2023-06-18 NOTE — ED Triage Notes (Signed)
 Pt arriving via [facility] from [facility] for difficulty swallowing and AMS. Pt only A&O x1. No documentation of Alzheimer's/Dementia. Pt able to follow commands.

## 2023-06-18 NOTE — ED Provider Notes (Signed)
[name] EMERGENCY DEPARTMENT AT [facility] Provider Note   CSN: [id number] Arrival date & time: 06/18/23  2020     History  Chief Complaint  Patient presents with   Dysphagia   Altered Mental Status    [name] is a 89 y.o. female.  HPI     89 year old female with a history of hypertension, paroxysmal SVT on metoprolol, prior GI bleed, breast cancer, CKD stage III, GERD, paroxysmal atrial fibrillation not on anticoagulation, dementia who presents from [place] for dysphagia and cough.   Family at bedside reports [name] has dementia and is nonambulatory at the facility.  Reports for the past 2 days [name] has had difficulty swallowing, has had coughing after swallowing and reported sore throat.  He reports that [name] otherwise seems to be at her mental baseline.  No noted focal numbness or weakness on one side or the other.  Reports that [name] has chronic right lower extremity weakness that is not worse or changed.  Denies known fevers, nausea, vomiting, abdominal pain.  [name] has had frequent coughing and coughing up phlegm.  Facility had reported EMS [name] had AMS.   Past Medical History:  Diagnosis Date   Arthritis    Cancer (HCC) 2011, 2012   left breast cancer 2011 central excision,2012 mastectomy   Complication of anesthesia    shortness of breath after procedure   Glaucoma    Hypertension    Protein-calorie malnutrition, severe (HCC)    SVT (supraventricular tachycardia) (HCC)    Upper GI bleed 01/2013.    Melena. HPylori negative gastritis on EGD.      Home Medications Prior to Admission medications   Medication Sig Start Date End Date Taking? Authorizing Provider  acetaminophen (TYLENOL) 500 MG tablet Take 2 tablets (1,000 mg total) by mouth every 6 (six) hours as needed for moderate pain (for pain). Patient taking differently: Take 1,000 mg by mouth as needed for moderate pain (pain score 4-6) (for pain). 01/03/21  Yes [name], PA-C  amLODipine  (NORVASC) 10 MG tablet TAKE 1 TABLET(10 MG) BY MOUTH DAILY 10/29/22  Yes [name], MD  Ascorbic Acid (VITAMIN C) 1000 MG tablet Take 1,000 mg by mouth at bedtime.   Yes [provider]  cyanocobalamin (VITAMIN B12) 1000 MCG/ML injection Inject 1,000 mcg into the muscle every 7 (seven) days.   Yes [provider]  diclofenac sodium (VOLTAREN) 1 % GEL Apply 2 g topically See admin instructions. Apply 2 grams to both knees for pain four times a day Patient taking differently: Apply 1 Application topically 4 (four) times daily. 10/31/17  Yes Roberto Scales D, MD  ferrous sulfate (FEROSUL) 325 (65 FE) MG tablet Take 325 mg by mouth at bedtime.   Yes [provider]  latanoprost (XALATAN) 0.005 % ophthalmic solution Place 1 drop into both eyes at bedtime. 06/17/18  Yes [provider]  metoprolol tartrate (LOPRESSOR) 50 MG tablet Take 1 tablet (50 mg total) by mouth 2 (two) times daily. 08/26/22  Yes [name], MD  OLANZapine (ZYPREXA) 2.5 MG tablet Take 1 tablet (2.5 mg total) by mouth at bedtime. 04/02/23  Yes [name], MD  pantoprazole sodium (PROTONIX) 40 mg Take 40 mg by mouth daily.   Yes [provider]  polyethylene glycol (MIRALAX / GLYCOLAX) 17 g packet Take 17 g by mouth as needed for mild constipation or moderate constipation.   Yes [provider]  thiamine (VITAMIN B1) 100 MG tablet Take 1  tablet (100 mg total) by mouth daily. 04/04/23  Yes [name], [name], MD  Vitamin D, Ergocalciferol, (DRISDOL) 1.25 MG (50000 UNIT) CAPS capsule Take 1 capsule (50,000 Units total) by mouth every 7 (seven) days. 04/03/23  Yes [name], [name], MD  cyanocobalamin (VITAMIN B12) 1000 MCG tablet Take 1 tablet (1,000 mcg total) by mouth daily. Patient not taking: Reported on 06/18/2023 04/04/23   [name], [name], MD  pantoprazole (PROTONIX) 40 MG tablet TAKE 1 TABLET(40 MG) BY MOUTH DAILY Patient not taking: Reported on  06/18/2023 08/26/22   [name], MD  polyethylene glycol (MIRALAX) 17 g packet Take 17 g by mouth daily. Patient not taking: Reported on 06/18/2023 04/04/23   [name], [name], MD  triamcinolone cream (KENALOG) 0.1 % Apply 1 Application topically 2 (two) times daily. Patient not taking: Reported on 06/18/2023 08/26/22   [name], MD      Allergies    Codeine    Review of Systems   Review of Systems  Physical Exam Updated Vital Signs BP (!) 179/110   Pulse 73   Temp 98 F (36.7 C)   Resp 20   SpO2 100%  Physical Exam Vitals and nursing note reviewed.  Constitutional:      General: [name] is not in acute distress.    Appearance: Normal appearance. [name] is well-developed. [name] is not ill-appearing or diaphoretic.  HENT:     Head: Normocephalic and atraumatic.     Mouth/Throat:     Pharynx: No oropharyngeal exudate or posterior oropharyngeal erythema.     Comments: No exudate, no swelling, midline uvula No stridor, normal neck rom Eyes:     General: No visual field deficit.    Extraocular Movements: Extraocular movements intact.     Conjunctiva/sclera: Conjunctivae normal.     Pupils: Pupils are equal, round, and reactive to light.  Cardiovascular:     Rate and Rhythm: Normal rate and regular rhythm.     Pulses: Normal pulses.     Heart sounds: Normal heart sounds. No murmur heard.    No friction rub. No gallop.  Pulmonary:     Effort: Pulmonary effort is normal. No respiratory distress.     Breath sounds: Normal breath sounds. No wheezing or rales.  Abdominal:     General: There is no distension.     Palpations: Abdomen is soft.     Tenderness: There is no abdominal tenderness. There is no guarding.  Musculoskeletal:        General: No swelling or tenderness.     Cervical back: Normal range of motion.  Skin:    General: Skin is warm and dry.     Findings: No erythema or rash.  Neurological:     Mental Status: [name] is alert.     GCS: GCS eye  subscore is 4. GCS verbal subscore is 5. GCS motor subscore is 6.     Cranial Nerves: No cranial nerve deficit, dysarthria or facial asymmetry.     Sensory: No sensory deficit.     Motor: Weakness (RLQ chronic per family) present. No tremor.     Coordination: Coordination normal. Finger-Nose-Finger Test normal.     Gait: Gait normal.     Comments: Symmetric palate rise      ED Results / Procedures / Treatments   Labs (all labs ordered are listed, but only abnormal results are displayed) Labs Reviewed  CBC WITH DIFFERENTIAL/PLATELET - Abnormal; Notable for the following components:  Result Value   Hemoglobin 11.9 (*)    MCH 25.4 (*)    Platelets 593 (*)    All other components within normal limits  COMPREHENSIVE METABOLIC PANEL - Abnormal; Notable for the following components:   Albumin 3.4 (*)    All other components within normal limits  RESP PANEL BY RT-PCR (RSV, FLU A&B, COVID)  [id number]  GROUP A STREP BY PCR  CULTURE, BLOOD (ROUTINE X 2)  CULTURE, BLOOD (ROUTINE X 2)  EXPECTORATED SPUTUM ASSESSMENT W GRAM STAIN, RFLX TO RESP C  BASIC METABOLIC PANEL  CBC  I-STAT CG4 LACTIC ACID, ED    EKG EKG Interpretation Date/Time:  Wednesday June 18 2023 22:39:33 EST Ventricular Rate:  69 PR Interval:    QRS Duration:  122 QT Interval:  447 QTC Calculation: 479 R Axis:   -75  Text Interpretation: Artifact present, sinus rhythm wiht PVCs Ventricular premature complex Nonspecific IVCD with LAD LVH with secondary repolarization abnormality Artifact in lead(s) I II III aVR aVL aVF V1 V2 V5 Confirmed by Alvira Monday ([id number]) on 06/19/2023 12:55:03 AM  Radiology CT Head Wo Contrast Result Date: 06/18/2023 CLINICAL DATA:  Altered mental status, nontraumatic (Ped 0-17y) EXAM: CT HEAD WITHOUT CONTRAST TECHNIQUE: Contiguous axial images were obtained from the base of the skull through the vertex without intravenous contrast. RADIATION DOSE REDUCTION: This exam was performed  according to the departmental dose-optimization program which includes automated exposure control, adjustment of the mA and/or kV according to patient size and/or use of iterative reconstruction technique. COMPARISON:  CT head 04/01/2023 FINDINGS: Brain: Patchy and confluent areas of decreased attenuation are noted throughout the deep and periventricular white matter of the cerebral hemispheres bilaterally, compatible with chronic microvascular ischemic disease. No evidence of large-territorial acute infarction. No parenchymal hemorrhage. No mass lesion. No extra-axial collection. No mass effect or midline shift. No hydrocephalus. Basilar cisterns are patent. Vascular: No hyperdense vessel. Skull: No acute fracture or focal lesion. Sinuses/Orbits: Paranasal sinuses and mastoid air cells are clear. Bilateral lens replacement. The orbits are unremarkable. Other: None. IMPRESSION: No acute intracranial abnormality. Electronically Signed   By: Tish Frederickson M.D.   On: 06/18/2023 22:17   DG Chest Portable 1 View Result Date: 06/18/2023 CLINICAL DATA:  ams, weakness EXAM: PORTABLE CHEST 1 VIEW COMPARISON:  Chest x-ray 03/31/2023 FINDINGS: Patient is rotated. The heart and mediastinal contours are unchanged. Atherosclerotic plaque. Question developing right lower lobe airspace opacity. No pulmonary edema. No pleural effusion. No pneumothorax. No acute osseous abnormality. Bilateral shoulder degenerative changes. There IMPRESSION: 1. Question developing right lower lobe airspace opacity. Recommend repeat chest x-ray PA and lateral view of the chest with improved patient positioning. 2.  Aortic Atherosclerosis (ICD10-I70.0). Electronically Signed   By: Tish Frederickson M.D.   On: 06/18/2023 22:14    Procedures Procedures    Medications Ordered in ED Medications  Ampicillin-Sulbactam (UNASYN) 3 g in sodium chloride 0.9 % 100 mL IVPB (has no administration in time range)  enoxaparin (LOVENOX) injection 40 mg (has  no administration in time range)  acetaminophen (TYLENOL) tablet 650 mg (has no administration in time range)    Or  acetaminophen (TYLENOL) suppository 650 mg (has no administration in time range)  prochlorperazine (COMPAZINE) injection 5 mg (has no administration in time range)  OLANZapine (ZYPREXA) injection 2.5 mg (has no administration in time range)  hydrALAZINE (APRESOLINE) injection 5 mg (has no administration in time range)  metoprolol tartrate (LOPRESSOR) injection 10 mg (has no administration in time range)  ED Course/ Medical Decision Making/ A&P                                    89 year old female with a history of hypertension, paroxysmal SVT on metoprolol, prior GI bleed, breast cancer, CKD stage III, GERD, paroxysmal atrial fibrillation not on anticoagulation, dementia who presents from [place] for dysphagia and cough.   Differential diagnosis for dysphagia includes central causes such as CVA, dysphagia related to progression of dementia, age, other pharyngitis.  [name] has no signs of peritonsillar abscess on exam, low suspicion for retropharyngeal abscess or epiglottitis by history and exam.  [name] has normal palate rise, no asymmetry, no other acute neurologic deficits on exam and have low suspicion for CVA.  Did order head CT or screening which was evaluated by me personally and shows no acute abnormalities.  Labs completed and personally evaluated interpreted by me show mild anemia, no leukocytosis, no clinically significant electrolyte abnormalities, insetting of strep throat, COVID, influenza, RSV and strep testing were completed and negative.  Lactic acid is within normal limits.  Chest x-ray was completed and personally about interpreted by me shows concern for right lower lobe airspace opacity.  By history of dysphagia and right lower lobe airspace opacity and cough suspect aspiration pneumonia.  Given Unasyn and azithromycin for treatment of community-acquired  pneumonia/aspiration pneumonia.  Given her age, nursing home status, [name] is high risk by pneumonia score is not appropriate for inpatient management of her pneumonia, as well as further inpatient evaluation of her dysphagia.          Final Clinical Impression(s) / ED Diagnoses Final diagnoses:  Aspiration pneumonia of right lower lobe, unspecified aspiration pneumonia type (HCC)  Dysphagia, unspecified type    Rx / DC Orders ED Discharge Orders     None         Alvira Monday, MD 06/19/23 [phone]

## 2023-06-18 NOTE — ED Notes (Signed)
 Patient transported to CT

## 2023-06-19 ENCOUNTER — Encounter (HOSPITAL_COMMUNITY): Payer: Self-pay | Admitting: Internal Medicine

## 2023-06-19 DIAGNOSIS — I10 Essential (primary) hypertension: Secondary | ICD-10-CM | POA: Diagnosis not present

## 2023-06-19 DIAGNOSIS — Z853 Personal history of malignant neoplasm of breast: Secondary | ICD-10-CM | POA: Diagnosis not present

## 2023-06-19 DIAGNOSIS — E876 Hypokalemia: Secondary | ICD-10-CM | POA: Diagnosis present

## 2023-06-19 DIAGNOSIS — D649 Anemia, unspecified: Secondary | ICD-10-CM | POA: Diagnosis present

## 2023-06-19 DIAGNOSIS — J690 Pneumonitis due to inhalation of food and vomit: Secondary | ICD-10-CM | POA: Diagnosis present

## 2023-06-19 DIAGNOSIS — R1310 Dysphagia, unspecified: Secondary | ICD-10-CM

## 2023-06-19 DIAGNOSIS — L89152 Pressure ulcer of sacral region, stage 2: Secondary | ICD-10-CM | POA: Diagnosis present

## 2023-06-19 DIAGNOSIS — R531 Weakness: Secondary | ICD-10-CM | POA: Diagnosis not present

## 2023-06-19 DIAGNOSIS — Z515 Encounter for palliative care: Secondary | ICD-10-CM | POA: Diagnosis not present

## 2023-06-19 DIAGNOSIS — F03911 Unspecified dementia, unspecified severity, with agitation: Secondary | ICD-10-CM | POA: Diagnosis present

## 2023-06-19 DIAGNOSIS — Z90710 Acquired absence of both cervix and uterus: Secondary | ICD-10-CM | POA: Diagnosis not present

## 2023-06-19 DIAGNOSIS — Z7401 Bed confinement status: Secondary | ICD-10-CM | POA: Diagnosis not present

## 2023-06-19 DIAGNOSIS — E162 Hypoglycemia, unspecified: Secondary | ICD-10-CM | POA: Diagnosis present

## 2023-06-19 DIAGNOSIS — R1313 Dysphagia, pharyngeal phase: Secondary | ICD-10-CM | POA: Diagnosis not present

## 2023-06-19 DIAGNOSIS — Z885 Allergy status to narcotic agent status: Secondary | ICD-10-CM | POA: Diagnosis not present

## 2023-06-19 DIAGNOSIS — Z66 Do not resuscitate: Secondary | ICD-10-CM | POA: Diagnosis present

## 2023-06-19 DIAGNOSIS — N183 Chronic kidney disease, stage 3 unspecified: Secondary | ICD-10-CM | POA: Diagnosis present

## 2023-06-19 DIAGNOSIS — Z8249 Family history of ischemic heart disease and other diseases of the circulatory system: Secondary | ICD-10-CM | POA: Diagnosis not present

## 2023-06-19 DIAGNOSIS — Z79899 Other long term (current) drug therapy: Secondary | ICD-10-CM | POA: Diagnosis not present

## 2023-06-19 DIAGNOSIS — I480 Paroxysmal atrial fibrillation: Secondary | ICD-10-CM | POA: Diagnosis present

## 2023-06-19 DIAGNOSIS — I129 Hypertensive chronic kidney disease with stage 1 through stage 4 chronic kidney disease, or unspecified chronic kidney disease: Secondary | ICD-10-CM | POA: Diagnosis present

## 2023-06-19 LAB — CBC
HCT: 35.6 % — ABNORMAL LOW (ref 36.0–46.0)
Hemoglobin: 10.8 g/dL — ABNORMAL LOW (ref 12.0–15.0)
MCH: 25.5 pg — ABNORMAL LOW (ref 26.0–34.0)
MCHC: 30.3 g/dL (ref 30.0–36.0)
MCV: 84.2 fL (ref 80.0–100.0)
Platelets: 448 10*3/uL — ABNORMAL HIGH (ref 150–400)
RBC: 4.23 MIL/uL (ref 3.87–5.11)
RDW: 15.2 % (ref 11.5–15.5)
WBC: 6.3 10*3/uL (ref 4.0–10.5)
nRBC: 0 % (ref 0.0–0.2)

## 2023-06-19 LAB — BASIC METABOLIC PANEL
Anion gap: 12 (ref 5–15)
BUN: 14 mg/dL (ref 8–23)
CO2: 23 mmol/L (ref 22–32)
Calcium: 8.6 mg/dL — ABNORMAL LOW (ref 8.9–10.3)
Chloride: 102 mmol/L (ref 98–111)
Creatinine, Ser: 0.73 mg/dL (ref 0.44–1.00)
GFR, Estimated: >60 mL/min (ref >60)
Glucose, Bld: 102 mg/dL — ABNORMAL HIGH (ref 70–99)
Potassium: 3.4 mmol/L — ABNORMAL LOW (ref 3.5–5.1)
Sodium: 137 mmol/L (ref 135–145)

## 2023-06-19 MED ORDER — OLANZAPINE 10 MG IM SOLR: 2.5000 mg | Freq: Once | INTRAMUSCULAR | Status: DC | PRN

## 2023-06-19 MED ORDER — ACETAMINOPHEN 650 MG RE SUPP: 650.0000 mg | Freq: Four times a day (QID) | RECTAL | Status: DC | PRN

## 2023-06-19 MED ORDER — METOPROLOL TARTRATE 5 MG/5ML IV SOLN
10.0000 mg | Freq: Four times a day (QID) | INTRAVENOUS | Status: DC
  Administered 2023-06-19 – 2023-06-20 (×6): 10 mg via INTRAVENOUS
  Filled 2023-06-19 (×6): qty 10

## 2023-06-19 MED ORDER — SODIUM CHLORIDE 0.9 % IV SOLN
3.0000 g | Freq: Four times a day (QID) | INTRAVENOUS | Status: DC
  Administered 2023-06-19 – 2023-06-22 (×11): 3 g via INTRAVENOUS
  Filled 2023-06-19 (×13): qty 8

## 2023-06-19 MED ORDER — HYDRALAZINE HCL 20 MG/ML IJ SOLN
5.0000 mg | INTRAMUSCULAR | Status: DC | PRN
  Administered 2023-06-19: 5 mg via INTRAVENOUS
  Filled 2023-06-19: qty 1

## 2023-06-19 MED ORDER — SODIUM CHLORIDE 0.45 % IV SOLN: INTRAVENOUS | Status: AC

## 2023-06-19 MED ORDER — ACETAMINOPHEN 325 MG PO TABS: 650.0000 mg | ORAL_TABLET | Freq: Four times a day (QID) | ORAL | Status: DC | PRN

## 2023-06-19 MED ORDER — POTASSIUM CHLORIDE 10 MEQ/100ML IV SOLN
10.0000 meq | INTRAVENOUS | Status: AC
  Administered 2023-06-19 (×2): 10 meq via INTRAVENOUS
  Filled 2023-06-19 (×2): qty 100

## 2023-06-19 MED ORDER — PROCHLORPERAZINE EDISYLATE 10 MG/2ML IJ SOLN: 5.0000 mg | Freq: Four times a day (QID) | INTRAMUSCULAR | Status: DC | PRN

## 2023-06-19 MED ORDER — ENOXAPARIN SODIUM 40 MG/0.4ML IJ SOSY
40.0000 mg | PREFILLED_SYRINGE | INTRAMUSCULAR | Status: DC
  Administered 2023-06-19 – 2023-06-20 (×2): 40 mg via SUBCUTANEOUS
  Filled 2023-06-19 (×3): qty 0.4

## 2023-06-19 NOTE — Progress Notes (Addendum)
[facility] PROGRESS NOTE   [name] ZOX:[id number] DOB: [birth year] DOA: 06/18/2023  PCP: Eloisa Northern, MD  Brief History: 89 y.o. female with medical history significant for dementia, atrial fibrillation not anticoagulated, paroxysmal SVT, hypertension, and history of hospital delirium who presents with difficulty swallowing.  She was hospitalized for further management.   Consultants: None  Procedures: None    Subjective/Interval History: Patient is pleasantly confused also lethargic.  Did not answer any of my questions.    Assessment/Plan:  Concern for dysphagia/Aspiration Pneumonia Apparently has been having difficulty swallowing.  Patient unable to answer any questions at this time so unable to specify what kind of dysphagia this might be.  Considering her dementia this could be oropharyngeal.  Will for start off with speech therapy evaluation.  She has had endoscopies previously last 1 being in 2016 which showed esophageal webs however it was nonobstructing. She appears to be dehydrated.  Will gently hydrate her with IV fluids till we are able to determine if she can have a diet. Chest x-ray did raise concern for developing opacity in the right lung base.  WBC noted to be normal. Daughter mentioned that patient has been coughing t her SNF. Will continue unasyn for now.  Hypokalemia Will be supplemented.  Normocytic anemia No evidence of overt bleeding.  Paroxysmal atrial fibrillation Not on anticoagulation due to bleeding risk.  Currently on IV metoprolol.  Prior to admission she was noted to be on metoprolol 50 mg twice a day.  Essential hypertension Was on amlodipine prior to admission which is currently on hold.  History of dementia She has a history of hospital delirium. Prior to admission medication list suggest that she was on olanzapine which can be continued when she is able to take orally.  Reorient daily.  DVT Prophylaxis: Lovenox Code Status:  DNR Family Communication: No family at bedside Disposition Plan: From SNF  Status is: Observation The patient will require care spanning > 2 midnights and should be moved to inpatient because: Dysphagia      Medications: Scheduled:  enoxaparin (LOVENOX) injection  40 mg Subcutaneous Q24H   metoprolol tartrate  10 mg Intravenous Q6H   Continuous: WJX:[id number] **OR** acetaminophen, hydrALAZINE, OLANZapine, prochlorperazine  Antibiotics: Anti-infectives (From admission, onward)    Start     Dose/Rate Route Frequency Ordered Stop   06/18/23 2200  azithromycin (ZITHROMAX) 500 mg in sodium chloride 0.9 % 250 mL IVPB  Status:  Discontinued        500 mg 250 mL/hr over 60 Minutes Intravenous  Once 06/18/23 2159 06/19/23 0000   06/18/23 2200  Ampicillin-Sulbactam (UNASYN) 3 g in sodium chloride 0.9 % 100 mL IVPB        3 g 200 mL/hr over 30 Minutes Intravenous  Once 06/18/23 2159 06/19/23 0143       Objective:  Vital Signs  Vitals:   06/19/23 0435 06/19/23 0654 06/19/23 0735 06/19/23 0909  BP: (!) 153/102 (!) 135/92 (!) 145/80   Pulse: 76 (!) 55 73   Resp: 18 18 18    Temp: 98 F (36.7 C)   98 F (36.7 C)  TempSrc: Oral   Oral  SpO2: 100% 98% 100%     Intake/Output Summary (Last 24 hours) at 06/19/2023 1000 Last data filed at 06/19/2023 0143 Gross per 24 hour  Intake 100 ml  Output --  Net 100 ml   There were no vitals filed for this visit.  General appearance: Lethargic.  Mumbling.  Does not answer  any questions. Resp: Clear to auscultation bilaterally.  Normal effort Cardio: S1-S2 is normal regular.  No S3-S4.  No rubs murmurs or bruit GI: Abdomen is soft.  Nontender nondistended.  Bowel sounds are present normal.  No masses organomegaly Extremities: Moving all of her extremities.   Lab Results:  Data Reviewed: I have personally reviewed following labs and reports of the imaging studies  CBC: Recent Labs  Lab 06/18/23 2036 06/19/23 0548  WBC 7.6 6.3   NEUTROABS 5.8  --   HGB 11.9* 10.8*  HCT 38.1 35.6*  MCV 81.2 84.2  PLT 593* 448*    Basic Metabolic Panel: Recent Labs  Lab 06/18/23 2036 06/19/23 0548  NA 140 137  K 3.6 3.4*  CL 104 102  CO2 26 23  GLUCOSE 94 102*  BUN 14 14  CREATININE 0.74 0.73  CALCIUM 9.1 8.6*    GFR: CrCl cannot be calculated (Unknown ideal weight.).  Liver Function Tests: Recent Labs  Lab 06/18/23 2036  AST 16  ALT 10  ALKPHOS 101  BILITOT 0.4  PROT 7.3  ALBUMIN 3.4*     Recent Results (from the past 240 hours)  Resp panel by RT-PCR (RSV, Flu A&B, Covid) Anterior Nasal Swab     Status: None   Collection Time: 06/18/23  9:59 PM   Specimen: Anterior Nasal Swab  Result Value Ref Range Status   SARS Coronavirus 2 by RT PCR NEGATIVE NEGATIVE Final    Comment: (NOTE) SARS-CoV-2 target nucleic acids are NOT DETECTED.  The SARS-CoV-2 RNA is generally detectable in upper respiratory specimens during the acute phase of infection. The lowest concentration of SARS-CoV-2 viral copies this assay can detect is 138 copies/mL. A negative result does not preclude SARS-Cov-2 infection and should not be used as the sole basis for treatment or other patient management decisions. A negative result may occur with  improper specimen collection/handling, submission of specimen other than nasopharyngeal swab, presence of viral mutation(s) within the areas targeted by this assay, and inadequate number of viral copies(<138 copies/mL). A negative result must be combined with clinical observations, patient history, and epidemiological information. The expected result is Negative.  Fact Sheet for Patients:  BloggerCourse.com  Fact Sheet for Healthcare Providers:  SeriousBroker.it  This test is no t yet approved or cleared by the [place] FDA and  has been authorized for detection and/or diagnosis of SARS-CoV-2 by FDA under an Emergency Use  Authorization (EUA). This EUA will remain  in effect (meaning this test can be used) for the duration of the COVID-19 declaration under Section 564(b)(1) of the Act, 21 U.S.C.section 360bbb-3(b)(1), unless the authorization is terminated  or revoked sooner.       Influenza A by PCR NEGATIVE NEGATIVE Final   Influenza B by PCR NEGATIVE NEGATIVE Final    Comment: (NOTE) The Xpert Xpress SARS-CoV-2/FLU/RSV plus assay is intended as an aid in the diagnosis of influenza from Nasopharyngeal swab specimens and should not be used as a sole basis for treatment. Nasal washings and aspirates are unacceptable for Xpert Xpress SARS-CoV-2/FLU/RSV testing.  Fact Sheet for Patients: BloggerCourse.com  Fact Sheet for Healthcare Providers: SeriousBroker.it  This test is not yet approved or cleared by the [place] FDA and has been authorized for detection and/or diagnosis of SARS-CoV-2 by FDA under an Emergency Use Authorization (EUA). This EUA will remain in effect (meaning this test can be used) for the duration of the COVID-19 declaration under Section 564(b)(1) of the Act, 21 U.S.C. section 360bbb-3(b)(1),  unless the authorization is terminated or revoked.     Resp Syncytial Virus by PCR NEGATIVE NEGATIVE Final    Comment: (NOTE) Fact Sheet for Patients: BloggerCourse.com  Fact Sheet for Healthcare Providers: SeriousBroker.it  This test is not yet approved or cleared by the [place] FDA and has been authorized for detection and/or diagnosis of SARS-CoV-2 by FDA under an Emergency Use Authorization (EUA). This EUA will remain in effect (meaning this test can be used) for the duration of the COVID-19 declaration under Section 564(b)(1) of the Act, 21 U.S.C. section 360bbb-3(b)(1), unless the authorization is terminated or revoked.  Performed at [facility],  [address] [place], [place] [address]   Group A Strep by PCR     Status: None   Collection Time: 06/18/23  9:59 PM   Specimen: Anterior Nasal Swab; Sterile Swab  Result Value Ref Range Status   Group A Strep by PCR NOT DETECTED NOT DETECTED Final    Comment: Performed at [facility], [address], [place], [place] [address]  Blood culture (routine x 2)     Status: None (Preliminary result)   Collection Time: 06/19/23 12:50 AM   Specimen: BLOOD  Result Value Ref Range Status   Specimen Description   Final    BLOOD BLOOD RIGHT WRIST Performed at [facility], [address] [place], [place] [address]    Special Requests   Final    BOTTLES DRAWN AEROBIC AND ANAEROBIC Blood Culture results may not be optimal due to an inadequate volume of blood received in culture bottles Performed at [facility], [address] [place], [place] [address]    Culture   Final    NO GROWTH <12 HOURS Performed at [facility], [address], [place], [place] [address]    Report Status PENDING  Incomplete  Blood culture (routine x 2)     Status: None (Preliminary result)   Collection Time: 06/19/23 12:54 AM   Specimen: BLOOD  Result Value Ref Range Status   Specimen Description   Final    BLOOD BLOOD LEFT HAND Performed at [facility], [address] [place], [place] [address]    Special Requests   Final    BOTTLES DRAWN AEROBIC AND ANAEROBIC Blood Culture results may not be optimal due to an inadequate volume of blood received in culture bottles Performed at [facility], [address], [place], [place] [address]    Culture   Final    NO GROWTH <12 HOURS Performed at [facility], [address] [place], [place] [address]    Report Status PENDING  Incomplete      Radiology Studies: CT Head Wo Contrast Result Date: 06/18/2023 CLINICAL DATA:  Altered mental status,  nontraumatic (Ped 0-17y) EXAM: CT HEAD WITHOUT CONTRAST TECHNIQUE: Contiguous axial images were obtained from the base of the skull through the vertex without intravenous contrast. RADIATION DOSE REDUCTION: This exam was performed according to the departmental dose-optimization program which includes automated exposure control, adjustment of the mA and/or kV according to patient size and/or use of iterative reconstruction technique. COMPARISON:  CT head 04/01/2023 FINDINGS: Brain: Patchy and confluent areas of decreased attenuation are noted throughout the deep and periventricular white matter of the cerebral hemispheres bilaterally, compatible with chronic microvascular ischemic disease. No evidence of large-territorial acute infarction. No parenchymal hemorrhage. No mass lesion. No extra-axial collection. No mass effect or midline shift. No  hydrocephalus. Basilar cisterns are patent. Vascular: No hyperdense vessel. Skull: No acute fracture or focal lesion. Sinuses/Orbits: Paranasal sinuses and mastoid air cells are clear. Bilateral lens replacement. The orbits are unremarkable. Other: None. IMPRESSION: No acute intracranial abnormality. Electronically Signed   By: Tish Frederickson M.D.   On: 06/18/2023 22:17   DG Chest Portable 1 View Result Date: 06/18/2023 CLINICAL DATA:  ams, weakness EXAM: PORTABLE CHEST 1 VIEW COMPARISON:  Chest x-ray 03/31/2023 FINDINGS: Patient is rotated. The heart and mediastinal contours are unchanged. Atherosclerotic plaque. Question developing right lower lobe airspace opacity. No pulmonary edema. No pleural effusion. No pneumothorax. No acute osseous abnormality. Bilateral shoulder degenerative changes. There IMPRESSION: 1. Question developing right lower lobe airspace opacity. Recommend repeat chest x-ray PA and lateral view of the chest with improved patient positioning. 2.  Aortic Atherosclerosis (ICD10-I70.0). Electronically Signed   By: Tish Frederickson M.D.   On: 06/18/2023  22:14       LOS: 0 days   [name]  [facility] Pager on [email or web address]  06/19/2023, 10:00 AM

## 2023-06-19 NOTE — H&P (Signed)
History and Physical    [name] KGM:[id number] DOB: 1921/12/21 DOA: 06/18/2023  PCP: Eloisa Northern, MD   Patient coming from: SNF   Chief Complaint: Difficulty swallowing   HPI: [name] is a 89 y.o. female with medical history significant for atrial fibrillation not anticoagulated, paroxysmal SVT, hypertension, and history of hospital delirium who presents with difficulty swallowing.  Per report of the patient's son in the ED, patient has dementia, is nonambulatory, and currently seems to be at her baseline cognitively.  She has had difficulty swallowing for the past 2 days and has been noted to have coughing fits after swallowing.  Patient complained of a sore throat.  She denies sore throat at time of admission and does not have any other complaints.  She has chronic right leg weakness per report of family.  No new focal weakness has been identified.  ED Course: Upon arrival to the ED, patient is found to be afebrile and saturating upper 90s on room air with normal respiratory rate and elevated blood pressure.  EKG demonstrates atrial fibrillation.  Head CT is negative for acute findings.  Chest x-ray raises question of developing right lower lobe airspace opacity.  Labs are most notable for normal renal function, normal WBC, normal lactic acid, negative group A strep, and negative respiratory virus panel.   Blood cultures and antibiotics were ordered from the ED.  Review of Systems:  ROS limited by patient's clinical condition.  Past Medical History:  Diagnosis Date   Arthritis    Cancer (HCC) 2011, 2012   left breast cancer 2011 central excision,2012 mastectomy   Complication of anesthesia    shortness of breath after procedure   Glaucoma    Hypertension    Protein-calorie malnutrition, severe (HCC)    SVT (supraventricular tachycardia) (HCC)    Upper GI bleed 01/2013.    Melena. HPylori negative gastritis on EGD.     Past Surgical History:  Procedure Laterality  Date   ABDOMINAL HYSTERECTOMY     BREAST SURGERY  2011   BREAST SURGERY  2012   lt breast masty   CHOLECYSTECTOMY     ESOPHAGOGASTRODUODENOSCOPY N/A 01/25/2013   Procedure: ESOPHAGOGASTRODUODENOSCOPY (EGD);  Surgeon: Beverley Fiedler, MD;  Location[facility] ENDOSCOPY;  Service: Gastroenterology;  Laterality: N/A;   ESOPHAGOGASTRODUODENOSCOPY (EGD) WITH PROPOFOL  08/04/2014   Procedure: ESOPHAGOGASTRODUODENOSCOPY (EGD) WITH PROPOFOL;  Surgeon: Louis Meckel, MD;  Location: [facility] ENDOSCOPY;  Service: Endoscopy;;    Social History:   reports that she has never smoked. She has never used smokeless tobacco. She reports that she does not drink alcohol and does not use drugs.  Allergies  Allergen Reactions   Codeine Nausea And Vomiting    Family History  Problem Relation Age of Onset   Coronary artery disease Mother 77       MI     Prior to Admission medications   Medication Sig Start Date End Date Taking? Authorizing Provider  acetaminophen (TYLENOL) 500 MG tablet Take 2 tablets (1,000 mg total) by mouth every 6 (six) hours as needed for moderate pain (for pain). Patient taking differently: Take 1,000 mg by mouth as needed for moderate pain (pain score 4-6) (for pain). 01/03/21  Yes [name], PA-C  amLODipine (NORVASC) 10 MG tablet TAKE 1 TABLET(10 MG) BY MOUTH DAILY 10/29/22  Yes [name], MD  Ascorbic Acid (VITAMIN C) 1000 MG tablet Take 1,000 mg by mouth at bedtime.   Yes [provider]  cyanocobalamin (VITAMIN  B12) 1000 MCG/ML injection Inject 1,000 mcg into the muscle every 7 (seven) days.   Yes [provider]  diclofenac sodium (VOLTAREN) 1 % GEL Apply 2 g topically See admin instructions. Apply 2 grams to both knees for pain four times a day Patient taking differently: Apply 1 Application topically 4 (four) times daily. 10/31/17  Yes Roberto Scales D, MD  ferrous sulfate (FEROSUL) 325 (65 FE) MG tablet Take 325 mg by mouth at bedtime.   Yes [provider]  latanoprost (XALATAN) 0.005 % ophthalmic solution Place 1 drop into both eyes at bedtime. 06/17/18  Yes [provider]  metoprolol tartrate (LOPRESSOR) 50 MG tablet Take 1 tablet (50 mg total) by mouth 2 (two) times daily. 08/26/22  Yes [name], MD  OLANZapine (ZYPREXA) 2.5 MG tablet Take 1 tablet (2.5 mg total) by mouth at bedtime. 04/02/23  Yes [name], MD  pantoprazole sodium (PROTONIX) 40 mg Take 40 mg by mouth daily.   Yes [provider]  polyethylene glycol (MIRALAX / GLYCOLAX) 17 g packet Take 17 g by mouth as needed for mild constipation or moderate constipation.   Yes [provider]  thiamine (VITAMIN B1) 100 MG tablet Take 1 tablet (100 mg total) by mouth daily. 04/04/23  Yes [name], [name], MD  Vitamin D, Ergocalciferol, (DRISDOL) 1.25 MG (50000 UNIT) CAPS capsule Take 1 capsule (50,000 Units total) by mouth every 7 (seven) days. 04/03/23  Yes [name], [name], MD  cyanocobalamin (VITAMIN B12) 1000 MCG tablet Take 1 tablet (1,000 mcg total) by mouth daily. Patient not taking: Reported on 06/18/2023 04/04/23   [name], [name], MD  pantoprazole (PROTONIX) 40 MG tablet TAKE 1 TABLET(40 MG) BY MOUTH DAILY Patient not taking: Reported on 06/18/2023 08/26/22   [name], MD  polyethylene glycol (MIRALAX) 17 g packet Take 17 g by mouth daily. Patient not taking: Reported on 06/18/2023 04/04/23   [name], [name], MD  triamcinolone cream (KENALOG) 0.1 % Apply 1 Application topically 2 (two) times daily. Patient not taking: Reported on 06/18/2023 08/26/22   [name], MD    Physical Exam: Vitals:   06/18/23 2130 06/18/23 2145 06/18/23 2215 06/18/23 2330  BP: (!) 160/81 (!) 162/73 (!) 197/98 (!) 167/104  Pulse: 66 66 65 72  Resp:    19  Temp:    98 F (36.7 C)  SpO2: 99% 98% 99% 100%    Constitutional: NAD, no pallor or diaphoresis   Eyes: PERTLA, lids and conjunctivae normal ENMT: Mucous  membranes are moist. Posterior pharynx clear of any exudate or lesions.   Neck: supple, no masses  Respiratory: no wheezing, no crackles. No accessory muscle use.  Cardiovascular: S1 & S2 heard, regular rate and rhythm. Pitting edema involving lower legs bilaterally.  Abdomen: No distension, no tenderness, soft. Bowel sounds active.  Musculoskeletal: no clubbing / cyanosis. No joint deformity upper and lower extremities.   Skin: no significant rashes, lesions, ulcers. Warm, dry, well-perfused. Neurologic: CN 2-12 grossly intact. Moving all extremities. Alert and oriented to person.  Psychiatric: Calm. Cooperative.    Labs and Imaging on Admission: I have personally reviewed following labs and imaging studies  CBC: Recent Labs  Lab 06/18/23 2036  WBC 7.6  NEUTROABS 5.8  HGB 11.9*  HCT 38.1  MCV 81.2  PLT 593*   Basic Metabolic Panel: Recent Labs  Lab 06/18/23 2036  NA 140  K 3.6  CL 104  CO2 26  GLUCOSE 94  BUN 14  CREATININE 0.74  CALCIUM 9.1   GFR: CrCl cannot be calculated (Unknown ideal weight.). Liver Function Tests: Recent Labs  Lab 06/18/23 2036  AST 16  ALT 10  ALKPHOS 101  BILITOT 0.4  PROT 7.3  ALBUMIN 3.4*   No results for input(s): "LIPASE", "AMYLASE" in the last 168 hours. No results for input(s): "AMMONIA" in the last 168 hours. Coagulation Profile: No results for input(s): "INR", "PROTIME" in the last 168 hours. Cardiac Enzymes: No results for input(s): "CKTOTAL", "CKMB", "CKMBINDEX", "TROPONINI" in the last 168 hours. BNP (last 3 results) No results for input(s): "PROBNP" in the last 8760 hours. HbA1C: No results for input(s): "HGBA1C" in the last 72 hours. CBG: No results for input(s): "GLUCAP" in the last 168 hours. Lipid Profile: No results for input(s): "CHOL", "HDL", "LDLCALC", "TRIG", "CHOLHDL", "LDLDIRECT" in the last 72 hours. Thyroid Function Tests: No results for input(s): "TSH", "T4TOTAL", "FREET4", "T3FREE", "THYROIDAB" in  the last 72 hours. Anemia Panel: No results for input(s): "VITAMINB12", "FOLATE", "FERRITIN", "TIBC", "IRON", "RETICCTPCT" in the last 72 hours. Urine analysis:    Component Value Date/Time   COLORURINE YELLOW 03/31/2023 2055   APPEARANCEUR CLEAR 03/31/2023 2055   LABSPEC 1.011 03/31/2023 2055   PHURINE 6.0 03/31/2023 2055   GLUCOSEU NEGATIVE 03/31/2023 2055   GLUCOSEU NEGATIVE 12/05/2016 1533   HGBUR SMALL (A) 03/31/2023 2055   BILIRUBINUR NEGATIVE 03/31/2023 2055   BILIRUBINUR Neg 12/08/2017 1434   KETONESUR NEGATIVE 03/31/2023 2055   PROTEINUR NEGATIVE 03/31/2023 2055   UROBILINOGEN 0.2 12/08/2017 1434   UROBILINOGEN 1.0 12/05/2016 1533   NITRITE NEGATIVE 03/31/2023 2055   LEUKOCYTESUR NEGATIVE 03/31/2023 2055   Sepsis Labs: @LABRCNTIP (procalcitonin:4,lacticidven:4) ) Recent Results (from the past 240 hours)  Resp panel by RT-PCR (RSV, Flu A&B, Covid) Anterior Nasal Swab     Status: None   Collection Time: 06/18/23  9:59 PM   Specimen: Anterior Nasal Swab  Result Value Ref Range Status   SARS Coronavirus 2 by RT PCR NEGATIVE NEGATIVE Final    Comment: (NOTE) SARS-CoV-2 target nucleic acids are NOT DETECTED.  The SARS-CoV-2 RNA is generally detectable in upper respiratory specimens during the acute phase of infection. The lowest concentration of SARS-CoV-2 viral copies this assay can detect is 138 copies/mL. A negative result does not preclude SARS-Cov-2 infection and should not be used as the sole basis for treatment or other patient management decisions. A negative result may occur with  improper specimen collection/handling, submission of specimen other than nasopharyngeal swab, presence of viral mutation(s) within the areas targeted by this assay, and inadequate number of viral copies(<138 copies/mL). A negative result must be combined with clinical observations, patient history, and epidemiological information. The expected result is Negative.  Fact Sheet for  Patients:  BloggerCourse.com  Fact Sheet for Healthcare Providers:  SeriousBroker.it  This test is no t yet approved or cleared by the [place] FDA and  has been authorized for detection and/or diagnosis of SARS-CoV-2 by FDA under an Emergency Use Authorization (EUA). This EUA will remain  in effect (meaning this test can be used) for the duration of the COVID-19 declaration under Section 564(b)(1) of the Act, 21 U.S.C.section 360bbb-3(b)(1), unless the authorization is terminated  or revoked sooner.       Influenza A by PCR NEGATIVE NEGATIVE Final   Influenza B by PCR NEGATIVE NEGATIVE Final    Comment: (NOTE) The Xpert Xpress SARS-CoV-2/FLU/RSV plus assay is intended as an aid in the diagnosis of influenza from Nasopharyngeal swab  specimens and should not be used as a sole basis for treatment. Nasal washings and aspirates are unacceptable for Xpert Xpress SARS-CoV-2/FLU/RSV testing.  Fact Sheet for Patients: BloggerCourse.com  Fact Sheet for Healthcare Providers: SeriousBroker.it  This test is not yet approved or cleared by the [place] FDA and has been authorized for detection and/or diagnosis of SARS-CoV-2 by FDA under an Emergency Use Authorization (EUA). This EUA will remain in effect (meaning this test can be used) for the duration of the COVID-19 declaration under Section 564(b)(1) of the Act, 21 U.S.C. section 360bbb-3(b)(1), unless the authorization is terminated or revoked.     Resp Syncytial Virus by PCR NEGATIVE NEGATIVE Final    Comment: (NOTE) Fact Sheet for Patients: BloggerCourse.com  Fact Sheet for Healthcare Providers: SeriousBroker.it  This test is not yet approved or cleared by the [place] FDA and has been authorized for detection and/or diagnosis of SARS-CoV-2 by FDA under an Emergency  Use Authorization (EUA). This EUA will remain in effect (meaning this test can be used) for the duration of the COVID-19 declaration under Section 564(b)(1) of the Act, 21 U.S.C. section 360bbb-3(b)(1), unless the authorization is terminated or revoked.  Performed at [facility], [address] [place], [place] [address]   Group A Strep by PCR     Status: None   Collection Time: 06/18/23  9:59 PM   Specimen: Anterior Nasal Swab; Sterile Swab  Result Value Ref Range Status   Group A Strep by PCR NOT DETECTED NOT DETECTED Final    Comment: Performed at [facility], [address], [place], [place] [address]     Radiological Exams on Admission: CT Head Wo Contrast Result Date: 06/18/2023 CLINICAL DATA:  Altered mental status, nontraumatic (Ped 0-17y) EXAM: CT HEAD WITHOUT CONTRAST TECHNIQUE: Contiguous axial images were obtained from the base of the skull through the vertex without intravenous contrast. RADIATION DOSE REDUCTION: This exam was performed according to the departmental dose-optimization program which includes automated exposure control, adjustment of the mA and/or kV according to patient size and/or use of iterative reconstruction technique. COMPARISON:  CT head 04/01/2023 FINDINGS: Brain: Patchy and confluent areas of decreased attenuation are noted throughout the deep and periventricular white matter of the cerebral hemispheres bilaterally, compatible with chronic microvascular ischemic disease. No evidence of large-territorial acute infarction. No parenchymal hemorrhage. No mass lesion. No extra-axial collection. No mass effect or midline shift. No hydrocephalus. Basilar cisterns are patent. Vascular: No hyperdense vessel. Skull: No acute fracture or focal lesion. Sinuses/Orbits: Paranasal sinuses and mastoid air cells are clear. Bilateral lens replacement. The orbits are unremarkable. Other: None. IMPRESSION: No acute intracranial abnormality.  Electronically Signed   By: Tish Frederickson M.D.   On: 06/18/2023 22:17   DG Chest Portable 1 View Result Date: 06/18/2023 CLINICAL DATA:  ams, weakness EXAM: PORTABLE CHEST 1 VIEW COMPARISON:  Chest x-ray 03/31/2023 FINDINGS: Patient is rotated. The heart and mediastinal contours are unchanged. Atherosclerotic plaque. Question developing right lower lobe airspace opacity. No pulmonary edema. No pleural effusion. No pneumothorax. No acute osseous abnormality. Bilateral shoulder degenerative changes. There IMPRESSION: 1. Question developing right lower lobe airspace opacity. Recommend repeat chest x-ray PA and lateral view of the chest with improved patient positioning. 2.  Aortic Atherosclerosis (ICD10-I70.0). Electronically Signed   By: Tish Frederickson M.D.   On: 06/18/2023 22:14    EKG: Independently reviewed. Atrial fibrillation, PVC, IVCD with LAD, LVH with repolarization abnormality.   Assessment/Plan   1. Dysphagia; ?aspiration  -  No acute findings on head CT or new focal deficits - No respiratory or infectious s/s   - Keep NPO pending SLP evaluation, hold further antibiotic for now    2. PAF  - Not anticoagulated d/t bleeding risk  - Rate control with IV metoprolol while NPO pending SLP eval    3. Hypertension  - Treat as-needed only while NPO pending SLP eval    4. Hx of hospital delirium  - Delirium precautions     DVT prophylaxis: Lovenox  Code Status: DNR Level of Care: Level of care: Med-Surg Family Communication: Son updated from ED   Disposition Plan:  Patient is from: SNF  Anticipated d/c is to: SNF  Anticipated d/c date is: 1/16 or 06/20/23  Patient currently: Pending SLP eval  Consults called: None  Admission status: Observation     Briscoe Deutscher, MD [facility]  06/19/2023, 12:07 AM

## 2023-06-19 NOTE — Care Management Obs Status (Signed)
MEDICARE OBSERVATION STATUS NOTIFICATION   Patient Details  Name: [name] MRN: [id number] Date of Birth: 1922/02/14   Medicare Observation Status Notification Given:  Yes    Princella Ion, LCSW 06/19/2023, 10:33 AM

## 2023-06-19 NOTE — Plan of Care (Signed)
  Problem: Coping: Goal: Level of anxiety will decrease Outcome: Progressing   Problem: Pain Managment: Goal: General experience of comfort will improve and/or be controlled Outcome: Progressing   Problem: Safety: Goal: Ability to remain free from injury will improve Outcome: Progressing   Problem: Skin Integrity: Goal: Risk for impaired skin integrity will decrease Outcome: Progressing

## 2023-06-19 NOTE — ED Notes (Signed)
Pt is Alert but AAOx1 only oriented to self alone. Pt is slow to speak, answers most questions with "I don't know" and does not follow commands well pt is very lethargic.

## 2023-06-19 NOTE — ED Notes (Signed)
Pt refusing to wear cardiac monitors

## 2023-06-19 NOTE — Evaluation (Signed)
Clinical/Bedside Swallow Evaluation Patient Details  Name: TYRA RAISANEN MRN: [id number] Date of Birth: 04-13-1922  Today's Date: 06/19/2023 Time: SLP Start Time (ACUTE ONLY): 1035 SLP Stop Time (ACUTE ONLY): 1100 SLP Time Calculation (min) (ACUTE ONLY): 25 min  Past Medical History:  Past Medical History:  Diagnosis Date   Arthritis    Cancer (HCC) 2011, 2012   left breast cancer 2011 central excision,2012 mastectomy   Complication of anesthesia    shortness of breath after procedure   Glaucoma    Hypertension    Protein-calorie malnutrition, severe (HCC)    SVT (supraventricular tachycardia) (HCC)    Upper GI bleed 01/2013.    Melena. HPylori negative gastritis on EGD.    Past Surgical History:  Past Surgical History:  Procedure Laterality Date   ABDOMINAL HYSTERECTOMY     BREAST SURGERY  2011   BREAST SURGERY  2012   lt breast masty   CHOLECYSTECTOMY     ESOPHAGOGASTRODUODENOSCOPY N/A 01/25/2013   Procedure: ESOPHAGOGASTRODUODENOSCOPY (EGD);  Surgeon: Beverley Fiedler, MD;  Location: [facility] ENDOSCOPY;  Service: Gastroenterology;  Laterality: N/A;   ESOPHAGOGASTRODUODENOSCOPY (EGD) WITH PROPOFOL  08/04/2014   Procedure: ESOPHAGOGASTRODUODENOSCOPY (EGD) WITH PROPOFOL;  Surgeon: Louis Meckel, MD;  Location: [facility] ENDOSCOPY;  Service: Endoscopy;;   HPI:  FALCON CASALE is a 89 y.o. female with medical history significant for atrial fibrillation not anticoagulated, paroxysmal SVT, hypertension, and history of hospital delirium who presents with difficulty swallowing.     Per report of the patient's son in the ED, patient has dementia, is nonambulatory, and currently seems to be at her baseline cognitively.  She has had difficulty swallowing for the past 2 days and has been noted to have coughing fits after swallowing.  Patient complained of a sore throat. CXR 06/18/23 indicated Question developing right lower lobe airspace opacity. Recommend  repeat chest x-ray PA and lateral view of the  chest with improved  patient positioning.  ST consulted for clinical swallowing evaluation with pt currently placed NPO.    Assessment / Plan / Recommendation  Clinical Impression  Pt seen for clinical swallowing evaluation with reported coughing after swallowing at SNF.  Pt consumes a mechanical soft/thin liquid diet at facility per family report.  Pt required encouragement/A with oral care/po intake during evaluation.  Granddaughter A SLP with various consistencies d/t refusal with SLP independently. Pt consumed single ice chips, thin via small volumes via straw/tsp without s/s of aspiration present.  Swallow timing was min delayed, but presbyphagia/baseline cognition may impact swallow timing intermittently.  An incidence of delayed cough/impaired mastication noted with soft solids (melon), but pt does have limited natural dentition and this is considered a mixed consistency requiring increased oral coordination.  Generalized oral weakness observed likely in the setting of deconditioning/current illness.  Odynophagia is questionable d/t prior reports of a sore throat per chart review/grimacing.  Refusal of intake may be related to this occurrence.  Pt declined further intake, so CSE was limited in nature.  Recommend initiating a conservative diet of Dysphagia 2(minced)/thin liquids with general swallowing precautions in place and ST will f/u for diet tolerance/potential for objective assessment prn and dysphagia tx/management in acute setting.  Thank you for this consult. SLP Visit Diagnosis: Dysphagia, unspecified (R13.10)    Aspiration Risk  Mild aspiration risk    Diet Recommendation   Thin;Dysphagia 2 (chopped)  Medication Administration: Crushed with puree    Other  Recommendations Oral Care Recommendations: Oral care BID;Staff/trained caregiver to provide oral care  Recommendations for follow up therapy are one component of a multi-disciplinary discharge planning process, led by the  attending physician.  Recommendations may be updated based on patient status, additional functional criteria and insurance authorization.  Follow up Recommendations Skilled nursing-short term rehab (<3 hours/day)      Assistance Recommended at Discharge  FULL  Functional Status Assessment Patient has had a recent decline in their functional status and demonstrates the ability to make significant improvements in function in a reasonable and predictable amount of time.  Frequency and Duration min 2x/week  1 week       Prognosis Prognosis for improved oropharyngeal function: Good Barriers to Reach Goals: Cognitive deficits      Swallow Study   General Date of Onset: 06/18/23 HPI: MICHON VLAHAKIS is a 89 y.o. female with medical history significant for atrial fibrillation not anticoagulated, paroxysmal SVT, hypertension, and history of hospital delirium who presents with difficulty swallowing.     Per report of the patient's son in the ED, patient has dementia, is nonambulatory, and currently seems to be at her baseline cognitively.  She has had difficulty swallowing for the past 2 days and has been noted to have coughing fits after swallowing.  Patient complained of a sore throat. CXR 06/18/23 indicated Question developing right lower lobe airspace opacity. Recommend  repeat chest x-ray PA and lateral view of the chest with improved  patient positioning.  ST consulted for CSE with pt currently placed NPO. Type of Study: Bedside Swallow Evaluation Previous Swallow Assessment: n/a Diet Prior to this Study: NPO Temperature Spikes Noted: No Respiratory Status: Room air History of Recent Intubation: No Behavior/Cognition: Alert;Uncooperative;Requires cueing Oral Cavity Assessment: Dry Oral Care Completed by SLP: Other (Comment) (granddaughter completed limited amount of oral care with pt declining further oral care) Oral Cavity - Dentition: Missing dentition Self-Feeding Abilities: Needs  assist;Needs set up Patient Positioning: Upright in bed Baseline Vocal Quality: Low vocal intensity Volitional Cough: Cognitively unable to elicit Volitional Swallow: Unable to elicit    Oral/Motor/Sensory Function Overall Oral Motor/Sensory Function: Generalized oral weakness   Ice Chips Ice chips: Impaired Presentation: Spoon Oral Phase Impairments: Impaired mastication Oral Phase Functional Implications: Oral holding Pharyngeal Phase Impairments: Suspected delayed Swallow Other Comments: Pt required encouragement to consume; question if odynophagia may play a role   Thin Liquid Thin Liquid: Impaired Presentation: Straw;Spoon Oral Phase Functional Implications: Oral holding Pharyngeal  Phase Impairments: Suspected delayed Swallow    Nectar Thick Nectar Thick Liquid: Not tested   Honey Thick Honey Thick Liquid: Not tested   Puree Puree: Impaired Presentation: Spoon Oral Phase Functional Implications: Oral holding   Solid     Solid: Impaired Presentation: Spoon Oral Phase Impairments: Impaired mastication;Reduced lingual movement/coordination Oral Phase Functional Implications: Oral holding;Impaired mastication;Prolonged oral transit Pharyngeal Phase Impairments: Multiple swallows;Cough - Delayed      Pat Elisavet Buehrer,M.S., CCC-SLP 06/19/2023,12:52 PM

## 2023-06-20 DIAGNOSIS — I10 Essential (primary) hypertension: Secondary | ICD-10-CM | POA: Diagnosis not present

## 2023-06-20 DIAGNOSIS — R1310 Dysphagia, unspecified: Secondary | ICD-10-CM | POA: Diagnosis not present

## 2023-06-20 DIAGNOSIS — I480 Paroxysmal atrial fibrillation: Secondary | ICD-10-CM | POA: Diagnosis not present

## 2023-06-20 LAB — BASIC METABOLIC PANEL
Anion gap: 10 (ref 5–15)
BUN: 11 mg/dL (ref 8–23)
CO2: 23 mmol/L (ref 22–32)
Calcium: 8.5 mg/dL — ABNORMAL LOW (ref 8.9–10.3)
Chloride: 107 mmol/L (ref 98–111)
Creatinine, Ser: 0.67 mg/dL (ref 0.44–1.00)
GFR, Estimated: >60 mL/min (ref >60)
Glucose, Bld: 74 mg/dL (ref 70–99)
Potassium: 3.6 mmol/L (ref 3.5–5.1)
Sodium: 140 mmol/L (ref 135–145)

## 2023-06-20 LAB — CBC
HCT: 33 % — ABNORMAL LOW (ref 36.0–46.0)
Hemoglobin: 10.2 g/dL — ABNORMAL LOW (ref 12.0–15.0)
MCH: 25.4 pg — ABNORMAL LOW (ref 26.0–34.0)
MCHC: 30.9 g/dL (ref 30.0–36.0)
MCV: 82.1 fL (ref 80.0–100.0)
Platelets: 487 10*3/uL — ABNORMAL HIGH (ref 150–400)
RBC: 4.02 MIL/uL (ref 3.87–5.11)
RDW: 15.3 % (ref 11.5–15.5)
WBC: 5.5 10*3/uL (ref 4.0–10.5)
nRBC: 0 % (ref 0.0–0.2)

## 2023-06-20 MED ORDER — OLANZAPINE 2.5 MG PO TABS
2.5000 mg | ORAL_TABLET | Freq: Every day | ORAL | Status: DC
Start: 2023-06-20 — End: 2023-06-23
  Administered 2023-06-20 – 2023-06-22 (×3): 2.5 mg via ORAL
  Filled 2023-06-20 (×3): qty 1

## 2023-06-20 MED ORDER — LATANOPROST 0.005 % OP SOLN
1.0000 [drp] | Freq: Every day | OPHTHALMIC | Status: DC
  Administered 2023-06-20 – 2023-06-22 (×3): 1 [drp] via OPHTHALMIC
  Filled 2023-06-20: qty 2.5

## 2023-06-20 MED ORDER — METOPROLOL TARTRATE 50 MG PO TABS
50.0000 mg | ORAL_TABLET | Freq: Two times a day (BID) | ORAL | Status: DC
  Administered 2023-06-20 – 2023-06-23 (×7): 50 mg via ORAL
  Filled 2023-06-20 (×7): qty 1

## 2023-06-20 MED ORDER — AMLODIPINE BESYLATE 10 MG PO TABS
10.0000 mg | ORAL_TABLET | Freq: Every day | ORAL | Status: DC
  Administered 2023-06-20 – 2023-06-23 (×4): 10 mg via ORAL
  Filled 2023-06-20 (×4): qty 1

## 2023-06-20 MED ORDER — PHENOL 1.4 % MT LIQD
1.0000 | OROMUCOSAL | Status: DC | PRN
  Filled 2023-06-20: qty 177

## 2023-06-20 MED ORDER — SODIUM CHLORIDE 0.45 % IV SOLN
INTRAVENOUS | Status: AC
Start: 2023-06-20 — End: 2023-06-21

## 2023-06-20 NOTE — Evaluation (Signed)
Occupational Therapy Evaluation [name] MRN: [id number] DOB: 07/27/1921 Today's Date: 06/20/2023   History of Present Illness Pt is a 89 yo female admitted 06/18/23 from [facility]. Patient was sent over due to episodes of cough at the nursing facility along with difficulty swallowing. Pt has experienced some sundowning and hospital induced delirium. Previous admission on10/28/24 for generalized weakness. PMH: HTN, aortic valve regurgitation, paroxysmal SVT, GIB, breast CA, CKD, dementia and GERD   Clinical Impression   Patient is currently requiring assistance with ADLs including Total assist with Lower body ADLs, Total assist with Upper body ADLs (Most at bed level except attempting feeding at EOB),  as well as  Moderate assist with bed mobility and inability to follow most commands, making attempts to stand or pivot to [facility] unsafe with assist of 1 person this date, so did not attempt.    Current level of function is below patient's typical baseline which is assisted but per daughter, pt usually feeds and grooms self-sometimes at bed level, usually can follow most 1-step commands and can propel her WC "a little" per daughter.   During this evaluation, patient was limited by generalized weakness, impaired activity tolerance, and primarily by exacerbating confusion and hospital delirium, all of which has the potential to impact patient's safety and independence during functional mobility, as well as performance for ADLs.  Patient lives at [facility] as a long term resident.  Patient demonstrates fair rehab potential, and may benefit from continued skilled occupational therapy services while in acute care to work on a return to performing or participating in her simple ADLs and mobility to decrease caregiver burden.  Continued occupational therapy services at a skilled nursing facility are recommended.  ?        If plan is discharge  home, recommend the following: Two people to help with walking and/or transfers;Supervision due to cognitive status;A lot of help with bathing/dressing/bathroom    Functional Status Assessment  Patient has had a recent decline in their functional status and demonstrates the ability to make significant improvements in function in a reasonable and predictable amount of time.  Equipment Recommendations  None recommended by OT    Recommendations for Other Services       Precautions / Restrictions Precautions Precautions: Fall Restrictions Weight Bearing Restrictions Per Provider Order: No      Mobility Bed Mobility Overal bed mobility: Needs Assistance Bed Mobility: Supine to Sit, Sit to Supine     Supine to sit: Mod assist, Used rails Sit to supine: Min assist, Used rails (Pt initiated)   General bed mobility comments: Pt resists at times, due to fear of falling per dtr.    Transfers                          Balance Overall balance assessment: Needs assistance   Sitting balance-Leahy Scale: Poor   Postural control: Posterior lean                                 ADL either performed or assessed with clinical judgement   ADL Overall ADL's : Needs assistance/impaired Eating/Feeding: Total assistance;Sitting Eating/Feeding Details (indicate cue type and reason): Pt asking for "my salmon bowl". Pt assisted to EOB and much more alert. Pt intiially sitting without external assistance but then began to have aposterior bias once using hands to pick  at food. Despite multimodal cues and and over hand assistance pt would not accept any PO. Pt picking up food in hands and dropping it onto her be. Dtr present and stated this is NOT pt's baseline. Grooming: Total assistance;Sitting;Cueing for sequencing   Upper Body Bathing: Total assistance;Bed level   Lower Body Bathing: +2 for physical assistance;Bed level;Total assistance   Upper Body Dressing : Maximal  assistance;Cueing for sequencing;Cueing for safety;Sitting Upper Body Dressing Details (indicate cue type and reason): MAX cues and assistance for pt to thread RT hand through gown sleeve. Pt pushing gown away at times. Lower Body Dressing: Bed level;Total assistance Lower Body Dressing Details (indicate cue type and reason): Total Assist to don socks.   Toilet Transfer Details (indicate cue type and reason): Unable to perform safely. Due to pt following no commands would n ot try without 2nd staff member. Toileting- Clothing Manipulation and Hygiene: Total assistance;+2 for physical assistance;Bed level       Functional mobility during ADLs: Moderate assistance;Maximal assistance;+2 for physical assistance       Vision Baseline Vision/History: 1 Wears glasses Ability to See in Adequate Light: 2 Moderately impaired (even with glasses, per daughter)       Perception         Praxis         Pertinent Vitals/Pain Pain Assessment Pain Assessment: PAINAD Breathing: normal Negative Vocalization: none Facial Expression: smiling or inexpressive Body Language: tense, distressed pacing, fidgeting (dementia related fidgeting. No sig of pain.) Consolability: no need to console PAINAD Score: 1 Pain Intervention(s):  (Pt only c/o feeling cold and was helped with extra blankets.)     Extremity/Trunk Assessment Upper Extremity Assessment Upper Extremity Assessment: Generalized weakness (Pt following no instructions for ROM or MMT)   Lower Extremity Assessment Lower Extremity Assessment: Generalized weakness;RLE deficits/detail RLE Deficits / Details: Noted RT knee edema (non-pitting).   Cervical / Trunk Assessment Cervical / Trunk Assessment: Kyphotic   Communication Communication Communication: Hearing impairment Cueing Techniques: Verbal cues;Gestural cues;Tactile cues;Visual cues   Cognition Arousal: Lethargic (More alert once EOB) Behavior During Therapy: Restless, Anxious,  Impulsive Overall Cognitive Status: Impaired/Different from baseline Area of Impairment: Following commands, Safety/judgement, Awareness, Memory, Attention, Orientation, Problem solving                 Orientation Level: Disoriented to, Place, Time, Situation Current Attention Level: Focused Memory: Decreased short-term memory Following Commands: Follows one step commands inconsistently Safety/Judgement: Decreased awareness of safety, Decreased awareness of deficits Awareness: Intellectual Problem Solving: Slow processing, Difficulty sequencing, Requires verbal cues, Requires tactile cues General Comments: Per dtr, pt can usually follow 1-step commands well. Pt respponds to visual cues typically such as food being placed in front of her is usually enouigh for pt to initiate feeting. Pt typically will use fork and knife without issues.     General Comments       Exercises     Shoulder Instructions      Home Living Family/patient expects to be discharged to:: Skilled nursing facility                                 Additional Comments: Pt has been residing as a long term resident [facility] x 2 month. Pt had PT/OT discontinued due to unable to show progress per therapy team.      Prior Functioning/Environment Prior Level of Function : Needs assist  Cognitive Assist : ADLs (cognitive);Mobility (cognitive)  Mobility (Cognitive): Step by step cues ADLs (Cognitive): Step by step cues Physical Assist : Mobility (physical);ADLs (physical) Mobility (physical): Bed mobility;Transfers ADLs (physical): IADLs;Bathing;Dressing;Toileting;Grooming;Feeding Mobility Comments: Per daughter, since pt has been in Nursing Home, she is total assist care with mobilizing to [facility] either via human lift or mechanical lift. Daughter in room and assisting with information and stated that pt can stand with assistance but not pivot or take steps. Pt can propell her WC "A little bit". ADLs  Comments: At baseline, per daughter pt feeds self after full setup with mechanical soft diet, Pt can brush teeth and wash face while in bed.        OT Problem List: Decreased cognition;Decreased knowledge of use of DME or AE;Impaired UE functional use;Impaired balance (sitting and/or standing);Decreased knowledge of precautions;Impaired vision/perception;Increased edema;Decreased coordination      OT Treatment/Interventions: Self-care/ADL training;Therapeutic activities;Cognitive remediation/compensation;Therapeutic exercise;Patient/family education;Balance training;DME and/or AE instruction    OT Goals(Current goals can be found in the care plan section) Acute Rehab OT Goals Patient Stated Goal: Per daughter, for pt to show returned ability to feed self, follow basic commands and perform simple rooming OT Goal Formulation: With family Time For Goal Achievement: 07/04/23 Potential to Achieve Goals: Fair ADL Goals Pt Will Perform Eating: with min assist;sitting;with adaptive utensils;with caregiver independent in assisting (With visual and verbal cues) Pt Will Perform Grooming: sitting;with min assist (Without external assist needed for sitting balance) Pt Will Perform Upper Body Bathing: sitting;with min assist Pt Will Perform Upper Body Dressing: with min assist;sitting (Step by step cues) Additional ADL Goal #1: Pt will demonstrate improved mentation, closer to baseline, by identifying at least 50% of common objects used for ADLs, and following 50% of simplae 1-step commands related to familiar ADLs, in order to maximize pt's participation in her own care.  OT Frequency: Min 1X/week    Co-evaluation              AM-PAC OT "6 Clicks" Daily Activity     Outcome Measure Help from another person eating meals?: Total Help from another person taking care of personal grooming?: Total Help from another person toileting, which includes using toliet, bedpan, or urinal?: Total Help from  another person bathing (including washing, rinsing, drying)?: Total Help from another person to put on and taking off regular upper body clothing?: Total Help from another person to put on and taking off regular lower body clothing?: Total 6 Click Score: 6   End of Session Nurse Communication: Other (comment)  Activity Tolerance: Patient tolerated treatment well Patient left: in bed;with call bell/phone within reach;with bed alarm set;with family/visitor present  OT Visit Diagnosis: Feeding difficulties (R63.3);Other symptoms and signs involving cognitive function                Time: 1610-9604 OT Time Calculation (min): 32 min Charges:  OT General Charges $OT Visit: 1 Visit OT Evaluation $OT Eval Low Complexity: 1 Low OT Treatments $Self Care/Home Management : 8-22 mins  Victorino Dike, OT Acute Rehab Services Office: [phone] 06/20/2023   Theodoro Clock 06/20/2023, 1:49 PM

## 2023-06-20 NOTE — Progress Notes (Signed)
[facility] PROGRESS NOTE   [name] ZHY:[id number] DOB: 1921/11/07 DOA: 06/18/2023  PCP: Eloisa Northern, MD  Brief History: 89 y.o. female with medical history significant for dementia, atrial fibrillation not anticoagulated, paroxysmal SVT, hypertension, and history of hospital delirium who presents with difficulty swallowing.  She was hospitalized for further management.   Consultants: None  Procedures: None    Subjective/Interval History: Patient noted to be awake alert this morning but very confused and distracted.  Does not answer questions appropriately.  Does not appear to be in any discomfort or distress.     Assessment/Plan:  Concern for dysphagia/Aspiration Pneumonia Patient was sent over due to episodes of cough at the nursing facility along with difficulty swallowing. She was seen by speech therapy and is currently noted to be on dysphagia 2 diet. Chest x-ray raised concern for developing opacity in the right lung base.  Due to her symptoms of cough she was started on Unasyn. Nursing staff reports that patient did not eat anything for supper last night.  She Spitting out her food. Family mentioned that this is all fairly acute.  Prior to a few days ago she was eating and drinking.  Will give antibiotics another 24 hours to take effect and also give fluids for another 24 hours.  If there is no improvement then might need to involve palliative care to have a goals of care conversation with family.    Hypokalemia Supplemented  Normocytic anemia No evidence for overt bleeding.  Hemoglobin is stable  Paroxysmal atrial fibrillation Not on anticoagulation due to bleeding risk.  Currently on IV metoprolol.  Prior to admission she was noted to be on metoprolol 50 mg twice a day. Change her to her usual metoprolol.  Essential hypertension Amlodipine was placed on hold due to her altered mental status.  Blood pressures are trending upwards.  Resume metoprolol and  amlodipine.  History of dementia She has a history of hospital delirium. Prior to admission medication list suggest that she was on olanzapine which can be continued when she is able to take orally.  Will resume today.  DVT Prophylaxis: Lovenox Code Status: DNR Family Communication: No family at bedside.  Discussed with her daughter over the phone yesterday.  Will update later today. Disposition Plan: From SNF    Medications: Scheduled:  enoxaparin (LOVENOX) injection  40 mg Subcutaneous Q24H   metoprolol tartrate  10 mg Intravenous Q6H   Continuous:  ampicillin-sulbactam (UNASYN) IV 3 g (06/20/23 0920)   EXB:[id number] **OR** acetaminophen, hydrALAZINE, OLANZapine, phenol, prochlorperazine  Antibiotics: Anti-infectives (From admission, onward)    Start     Dose/Rate Route Frequency Ordered Stop   06/19/23 1400  Ampicillin-Sulbactam (UNASYN) 3 g in sodium chloride 0.9 % 100 mL IVPB        3 g 200 mL/hr over 30 Minutes Intravenous Every 6 hours 06/19/23 1203     06/18/23 2200  azithromycin (ZITHROMAX) 500 mg in sodium chloride 0.9 % 250 mL IVPB  Status:  Discontinued        500 mg 250 mL/hr over 60 Minutes Intravenous  Once 06/18/23 2159 06/19/23 0000   06/18/23 2200  Ampicillin-Sulbactam (UNASYN) 3 g in sodium chloride 0.9 % 100 mL IVPB        3 g 200 mL/hr over 30 Minutes Intravenous  Once 06/18/23 2159 06/19/23 0143       Objective:  Vital Signs  Vitals:   06/19/23 2159 06/19/23 2357 06/20/23 0139 06/20/23 0641  BP: (!) 156/95 [name])  147/82 (!) 172/87 (!) 170/83  Pulse: 73 73 64 70  Resp: 17 17  17   Temp: 98 F (36.7 C) 97.8 F (36.6 C)    TempSrc: Oral Oral    SpO2: 100% 100%  98%  Height:        Intake/Output Summary (Last 24 hours) at 06/20/2023 1013 Last data filed at 06/20/2023 0857 Gross per 24 hour  Intake 781.29 ml  Output 1600 ml  Net -818.71 ml   There were no vitals filed for this visit.   General appearance: Awake alert.  In no distress.   Distracted Resp: Normal effort at rest.  Diminished air entry at the bases with few crackles.  No wheezing or rhonchi. Cardio: S1-S2 is normal regular.  No S3-S4.  No rubs murmurs or bruit GI: Abdomen is soft.  Nontender nondistended.  Bowel sounds are present normal.  No masses organomegaly Extremities: No edema.  Noted to be moving all of her extremities. No obvious focal neurological deficits.  She is however disoriented.  Lab Results:  Data Reviewed: I have personally reviewed following labs and reports of the imaging studies  CBC: Recent Labs  Lab 06/18/23 2036 06/19/23 0548 06/20/23 0340  WBC 7.6 6.3 5.5  NEUTROABS 5.8  --   --   HGB 11.9* 10.8* 10.2*  HCT 38.1 35.6* 33.0*  MCV 81.2 84.2 82.1  PLT 593* 448* 487*    Basic Metabolic Panel: Recent Labs  Lab 06/18/23 2036 06/19/23 0548 06/20/23 0340  NA 140 137 140  K 3.6 3.4* 3.6  CL 104 102 107  CO2 26 23 23   GLUCOSE 94 102* 74  BUN 14 14 11   CREATININE 0.74 0.73 0.67  CALCIUM 9.1 8.6* 8.5*    GFR: CrCl cannot be calculated (Unknown ideal weight.).  Liver Function Tests: Recent Labs  Lab 06/18/23 2036  AST 16  ALT 10  ALKPHOS 101  BILITOT 0.4  PROT 7.3  ALBUMIN 3.4*     Recent Results (from the past 240 hours)  Resp panel by RT-PCR (RSV, Flu A&B, Covid) Anterior Nasal Swab     Status: None   Collection Time: 06/18/23  9:59 PM   Specimen: Anterior Nasal Swab  Result Value Ref Range Status   SARS Coronavirus 2 by RT PCR NEGATIVE NEGATIVE Final    Comment: (NOTE) SARS-CoV-2 target nucleic acids are NOT DETECTED.  The SARS-CoV-2 RNA is generally detectable in upper respiratory specimens during the acute phase of infection. The lowest concentration of SARS-CoV-2 viral copies this assay can detect is 138 copies/mL. A negative result does not preclude SARS-Cov-2 infection and should not be used as the sole basis for treatment or other patient management decisions. A negative result may occur with   improper specimen collection/handling, submission of specimen other than nasopharyngeal swab, presence of viral mutation(s) within the areas targeted by this assay, and inadequate number of viral copies(<138 copies/mL). A negative result must be combined with clinical observations, patient history, and epidemiological information. The expected result is Negative.  Fact Sheet for Patients:  BloggerCourse.com  Fact Sheet for Healthcare Providers:  SeriousBroker.it  This test is no t yet approved or cleared by the [place] FDA and  has been authorized for detection and/or diagnosis of SARS-CoV-2 by FDA under an Emergency Use Authorization (EUA). This EUA will remain  in effect (meaning this test can be used) for the duration of the COVID-19 declaration under Section 564(b)(1) of the Act, 21 U.S.C.section 360bbb-3(b)(1), unless the authorization is terminated  or revoked sooner.       Influenza A by PCR NEGATIVE NEGATIVE Final   Influenza B by PCR NEGATIVE NEGATIVE Final    Comment: (NOTE) The Xpert Xpress SARS-CoV-2/FLU/RSV plus assay is intended as an aid in the diagnosis of influenza from Nasopharyngeal swab specimens and should not be used as a sole basis for treatment. Nasal washings and aspirates are unacceptable for Xpert Xpress SARS-CoV-2/FLU/RSV testing.  Fact Sheet for Patients: BloggerCourse.com  Fact Sheet for Healthcare Providers: SeriousBroker.it  This test is not yet approved or cleared by the [place] FDA and has been authorized for detection and/or diagnosis of SARS-CoV-2 by FDA under an Emergency Use Authorization (EUA). This EUA will remain in effect (meaning this test can be used) for the duration of the COVID-19 declaration under Section 564(b)(1) of the Act, 21 U.S.C. section 360bbb-3(b)(1), unless the authorization is terminated or revoked.      Resp Syncytial Virus by PCR NEGATIVE NEGATIVE Final    Comment: (NOTE) Fact Sheet for Patients: BloggerCourse.com  Fact Sheet for Healthcare Providers: SeriousBroker.it  This test is not yet approved or cleared by the [place] FDA and has been authorized for detection and/or diagnosis of SARS-CoV-2 by FDA under an Emergency Use Authorization (EUA). This EUA will remain in effect (meaning this test can be used) for the duration of the COVID-19 declaration under Section 564(b)(1) of the Act, 21 U.S.C. section 360bbb-3(b)(1), unless the authorization is terminated or revoked.  Performed at [facility], [address], [place], [place] [address]   Group A Strep by PCR     Status: None   Collection Time: 06/18/23  9:59 PM   Specimen: Anterior Nasal Swab; Sterile Swab  Result Value Ref Range Status   Group A Strep by PCR NOT DETECTED NOT DETECTED Final    Comment: Performed at [facility], [address], [place], [place] [address]  Blood culture (routine x 2)     Status: None (Preliminary result)   Collection Time: 06/19/23 12:50 AM   Specimen: BLOOD  Result Value Ref Range Status   Specimen Description   Final    BLOOD BLOOD RIGHT WRIST Performed at [facility], [address], [place], [place] [address]    Special Requests   Final    BOTTLES DRAWN AEROBIC AND ANAEROBIC Blood Culture results may not be optimal due to an inadequate volume of blood received in culture bottles Performed at [facility], [address] [place], [place] [address]    Culture   Final    NO GROWTH 1 DAY Performed at [facility], [address] [place], [place] [address]    Report Status PENDING  Incomplete  Blood culture (routine x 2)     Status: None (Preliminary result)   Collection Time: 06/19/23 12:54 AM   Specimen: BLOOD  Result Value Ref Range Status    Specimen Description   Final    BLOOD BLOOD LEFT HAND Performed at [facility], [address], [place], [place] [address]    Special Requests   Final    BOTTLES DRAWN AEROBIC AND ANAEROBIC Blood Culture results may not be optimal due to an inadequate volume of blood received in culture bottles Performed at [facility], [address], [place], [place] [address]    Culture   Final    NO GROWTH 1 DAY Performed at [facility], [address], [place],  [place] [address]    Report Status PENDING  Incomplete      Radiology Studies: CT Head Wo Contrast Result Date: 06/18/2023 CLINICAL DATA:  Altered mental status, nontraumatic (Ped 0-17y) EXAM: CT HEAD WITHOUT CONTRAST TECHNIQUE: Contiguous axial images were obtained from the base of the skull through the vertex without intravenous contrast. RADIATION DOSE REDUCTION: This exam was performed according to the departmental dose-optimization program which includes automated exposure control, adjustment of the mA and/or kV according to patient size and/or use of iterative reconstruction technique. COMPARISON:  CT head 04/01/2023 FINDINGS: Brain: Patchy and confluent areas of decreased attenuation are noted throughout the deep and periventricular white matter of the cerebral hemispheres bilaterally, compatible with chronic microvascular ischemic disease. No evidence of large-territorial acute infarction. No parenchymal hemorrhage. No mass lesion. No extra-axial collection. No mass effect or midline shift. No hydrocephalus. Basilar cisterns are patent. Vascular: No hyperdense vessel. Skull: No acute fracture or focal lesion. Sinuses/Orbits: Paranasal sinuses and mastoid air cells are clear. Bilateral lens replacement. The orbits are unremarkable. Other: None. IMPRESSION: No acute intracranial abnormality. Electronically Signed   By: Tish Frederickson M.D.   On: 06/18/2023 22:17   DG Chest Portable 1 View Result  Date: 06/18/2023 CLINICAL DATA:  ams, weakness EXAM: PORTABLE CHEST 1 VIEW COMPARISON:  Chest x-ray 03/31/2023 FINDINGS: Patient is rotated. The heart and mediastinal contours are unchanged. Atherosclerotic plaque. Question developing right lower lobe airspace opacity. No pulmonary edema. No pleural effusion. No pneumothorax. No acute osseous abnormality. Bilateral shoulder degenerative changes. There IMPRESSION: 1. Question developing right lower lobe airspace opacity. Recommend repeat chest x-ray PA and lateral view of the chest with improved patient positioning. 2.  Aortic Atherosclerosis (ICD10-I70.0). Electronically Signed   By: Tish Frederickson M.D.   On: 06/18/2023 22:14       LOS: 1 day   Jekhi Bolin  [facility] Pager on [email or web address]  06/20/2023, 10:13 AM

## 2023-06-21 DIAGNOSIS — R1310 Dysphagia, unspecified: Secondary | ICD-10-CM | POA: Diagnosis not present

## 2023-06-21 DIAGNOSIS — E162 Hypoglycemia, unspecified: Secondary | ICD-10-CM

## 2023-06-21 DIAGNOSIS — I10 Essential (primary) hypertension: Secondary | ICD-10-CM | POA: Diagnosis not present

## 2023-06-21 DIAGNOSIS — I480 Paroxysmal atrial fibrillation: Secondary | ICD-10-CM | POA: Diagnosis not present

## 2023-06-21 LAB — BASIC METABOLIC PANEL
Anion gap: 9 (ref 5–15)
BUN: 10 mg/dL (ref 8–23)
CO2: 25 mmol/L (ref 22–32)
Calcium: 8.5 mg/dL — ABNORMAL LOW (ref 8.9–10.3)
Chloride: 104 mmol/L (ref 98–111)
Creatinine, Ser: 0.71 mg/dL (ref 0.44–1.00)
GFR, Estimated: >60 mL/min (ref >60)
Glucose, Bld: 61 mg/dL — ABNORMAL LOW (ref 70–99)
Potassium: 3.3 mmol/L — ABNORMAL LOW (ref 3.5–5.1)
Sodium: 138 mmol/L (ref 135–145)

## 2023-06-21 LAB — GLUCOSE, CAPILLARY
Glucose-Capillary: 167 mg/dL — ABNORMAL HIGH (ref 70–99)
Glucose-Capillary: 118 mg/dL — ABNORMAL HIGH (ref 70–99)
Glucose-Capillary: 156 mg/dL — ABNORMAL HIGH (ref 70–99)
Glucose-Capillary: 135 mg/dL — ABNORMAL HIGH (ref 70–99)

## 2023-06-21 LAB — MAGNESIUM: Magnesium: 2.1 mg/dL (ref 1.7–2.4)

## 2023-06-21 MED ORDER — DEXTROSE 50 % IV SOLN
1.0000 | Freq: Once | INTRAVENOUS | Status: AC
  Administered 2023-06-21: 50 mL via INTRAVENOUS
  Filled 2023-06-21: qty 50

## 2023-06-21 MED ORDER — POTASSIUM CHLORIDE 20 MEQ PO PACK
40.0000 meq | PACK | Freq: Once | ORAL | Status: AC
  Administered 2023-06-21: 40 meq via ORAL
  Filled 2023-06-21: qty 2

## 2023-06-21 MED ORDER — CARMEX CLASSIC LIP BALM EX OINT
TOPICAL_OINTMENT | CUTANEOUS | Status: DC | PRN
  Administered 2023-06-21: 1 via TOPICAL
  Filled 2023-06-21 (×2): qty 10

## 2023-06-21 MED ORDER — POTASSIUM CHLORIDE CRYS ER 20 MEQ PO TBCR: 40.0000 meq | EXTENDED_RELEASE_TABLET | Freq: Once | ORAL | Status: DC

## 2023-06-21 NOTE — Progress Notes (Signed)
[facility] PROGRESS NOTE   [name]:[id number] DOB: 10-23-1921 DOA: 06/18/2023  PCP: Eloisa Northern, MD  Brief History: 89 y.o. female with medical history significant for dementia, atrial fibrillation not anticoagulated, paroxysmal SVT, hypertension, and history of hospital delirium who presents with difficulty swallowing.  She was hospitalized for further management.   Consultants: None  Procedures: None    Subjective/Interval History: Patient awake alert.  Could not sleep well overnight.  Denies any pain issues this morning.     Assessment/Plan:  Concern for dysphagia/Aspiration Pneumonia Patient was sent over due to episodes of cough at the nursing facility along with difficulty swallowing. She was seen by speech therapy and is currently noted to be on dysphagia 2 diet. Chest x-ray raised concern for developing opacity in the right lung base.  Due to her symptoms of cough she was started on Unasyn. Did have something to eat and drink yesterday.  Continue to monitor oral intake. Mentation appears to have improved in the last 24 hours.  Continue with antibiotics for now.  Hypokalemia Continue to supplement.  Magnesium 2.1.  Hypoglycemia Likely secondary to poor oral intake.  Monitor CBGs.  D 50 x 1.  Normocytic anemia No evidence for overt bleeding.  Hemoglobin is stable  Paroxysmal atrial fibrillation Not on anticoagulation due to bleeding risk.  Continue metoprolol.  Essential hypertension Continue amlodipine and metoprolol.  Monitor blood pressures closely.  History of dementia She has a history of hospital delirium. Continue with olanzapine which she was taking prior to admission  DVT Prophylaxis: Family requests stopping Lovenox since she has had bleeding episodes with it previously.  Will change to SCDs. Code Status: DNR Family Communication: No family at bedside.  Will reach out to family today.   Disposition Plan: From  SNF    Medications: Scheduled:  amLODipine  10 mg Oral Daily   latanoprost  1 drop Both Eyes QHS   metoprolol tartrate  50 mg Oral BID   OLANZapine  2.5 mg Oral QHS   Continuous:  sodium chloride 75 mL/hr at 06/20/23 1126   ampicillin-sulbactam (UNASYN) IV 3 g (06/21/23 0834)   WJX:[id number] **OR** acetaminophen, hydrALAZINE, lip balm, phenol, prochlorperazine  Antibiotics: Anti-infectives (From admission, onward)    Start     Dose/Rate Route Frequency Ordered Stop   06/19/23 1400  Ampicillin-Sulbactam (UNASYN) 3 g in sodium chloride 0.9 % 100 mL IVPB        3 g 200 mL/hr over 30 Minutes Intravenous Every 6 hours 06/19/23 1203     06/18/23 2200  azithromycin (ZITHROMAX) 500 mg in sodium chloride 0.9 % 250 mL IVPB  Status:  Discontinued        500 mg 250 mL/hr over 60 Minutes Intravenous  Once 06/18/23 2159 06/19/23 0000   06/18/23 2200  Ampicillin-Sulbactam (UNASYN) 3 g in sodium chloride 0.9 % 100 mL IVPB        3 g 200 mL/hr over 30 Minutes Intravenous  Once 06/18/23 2159 06/19/23 0143       Objective:  Vital Signs  Vitals:   06/20/23 1035 06/20/23 2116 06/20/23 2124 06/21/23 0601  BP: (!) 156/80 (!) 156/80 (!) 157/81 (!) 163/73  Pulse: 67 67 78 68  Resp: 16  16 17   Temp: 98.4 F (36.9 C)  98.2 F (36.8 C) 97.8 F (36.6 C)  TempSrc: Oral  Oral Oral  SpO2: 100%  100% 97%  Height:        Intake/Output Summary (Last 24 hours) at  06/21/2023 0938 Last data filed at 06/21/2023 0600 Gross per 24 hour  Intake 1505 ml  Output 1800 ml  Net -295 ml    General appearance: Awake alert.  In no distress.  Distracted Resp: Clear to auscultation bilaterally.  Normal effort Cardio: S1-S2 is normal regular.  No S3-S4.  No rubs murmurs or bruit GI: Abdomen is soft.  Nontender nondistended.  Bowel sounds are present normal.  No masses organomegaly Extremities: No edema.  Physical deconditioning is noted. No obvious focal neurological deficits noted  Lab  Results:  Data Reviewed: I have personally reviewed following labs and reports of the imaging studies  CBC: Recent Labs  Lab 06/18/23 2036 06/19/23 0548 06/20/23 0340  WBC 7.6 6.3 5.5  NEUTROABS 5.8  --   --   HGB 11.9* 10.8* 10.2*  HCT 38.1 35.6* 33.0*  MCV 81.2 84.2 82.1  PLT 593* 448* 487*    Basic Metabolic Panel: Recent Labs  Lab 06/18/23 2036 06/19/23 0548 06/20/23 0340 06/21/23 0343  NA 140 137 140 138  K 3.6 3.4* 3.6 3.3*  CL 104 102 107 104  CO2 26 23 23 25   GLUCOSE 94 102* 74 61*  BUN 14 14 11 10   CREATININE 0.74 0.73 0.67 0.71  CALCIUM 9.1 8.6* 8.5* 8.5*  MG  --   --   --  2.1    GFR: CrCl cannot be calculated (Unknown ideal weight.).  Liver Function Tests: Recent Labs  Lab 06/18/23 2036  AST 16  ALT 10  ALKPHOS 101  BILITOT 0.4  PROT 7.3  ALBUMIN 3.4*     Recent Results (from the past 240 hours)  Resp panel by RT-PCR (RSV, Flu A&B, Covid) Anterior Nasal Swab     Status: None   Collection Time: 06/18/23  9:59 PM   Specimen: Anterior Nasal Swab  Result Value Ref Range Status   SARS Coronavirus 2 by RT PCR NEGATIVE NEGATIVE Final    Comment: (NOTE) SARS-CoV-2 target nucleic acids are NOT DETECTED.  The SARS-CoV-2 RNA is generally detectable in upper respiratory specimens during the acute phase of infection. The lowest concentration of SARS-CoV-2 viral copies this assay can detect is 138 copies/mL. A negative result does not preclude SARS-Cov-2 infection and should not be used as the sole basis for treatment or other patient management decisions. A negative result may occur with  improper specimen collection/handling, submission of specimen other than nasopharyngeal swab, presence of viral mutation(s) within the areas targeted by this assay, and inadequate number of viral copies(<138 copies/mL). A negative result must be combined with clinical observations, patient history, and epidemiological information. The expected result is  Negative.  Fact Sheet for Patients:  BloggerCourse.com  Fact Sheet for Healthcare Providers:  SeriousBroker.it  This test is no t yet approved or cleared by the [place] FDA and  has been authorized for detection and/or diagnosis of SARS-CoV-2 by FDA under an Emergency Use Authorization (EUA). This EUA will remain  in effect (meaning this test can be used) for the duration of the COVID-19 declaration under Section 564(b)(1) of the Act, 21 U.S.C.section 360bbb-3(b)(1), unless the authorization is terminated  or revoked sooner.       Influenza A by PCR NEGATIVE NEGATIVE Final   Influenza B by PCR NEGATIVE NEGATIVE Final    Comment: (NOTE) The Xpert Xpress SARS-CoV-2/FLU/RSV plus assay is intended as an aid in the diagnosis of influenza from Nasopharyngeal swab specimens and should not be used as a sole basis for treatment. Nasal  washings and aspirates are unacceptable for Xpert Xpress SARS-CoV-2/FLU/RSV testing.  Fact Sheet for Patients: BloggerCourse.com  Fact Sheet for Healthcare Providers: SeriousBroker.it  This test is not yet approved or cleared by the [place] FDA and has been authorized for detection and/or diagnosis of SARS-CoV-2 by FDA under an Emergency Use Authorization (EUA). This EUA will remain in effect (meaning this test can be used) for the duration of the COVID-19 declaration under Section 564(b)(1) of the Act, 21 U.S.C. section 360bbb-3(b)(1), unless the authorization is terminated or revoked.     Resp Syncytial Virus by PCR NEGATIVE NEGATIVE Final    Comment: (NOTE) Fact Sheet for Patients: BloggerCourse.com  Fact Sheet for Healthcare Providers: SeriousBroker.it  This test is not yet approved or cleared by the [place] FDA and has been authorized for detection and/or diagnosis of  SARS-CoV-2 by FDA under an Emergency Use Authorization (EUA). This EUA will remain in effect (meaning this test can be used) for the duration of the COVID-19 declaration under Section 564(b)(1) of the Act, 21 U.S.C. section 360bbb-3(b)(1), unless the authorization is terminated or revoked.  Performed at [facility], [address], [place], [place] [address]   Group A Strep by PCR     Status: None   Collection Time: 06/18/23  9:59 PM   Specimen: Anterior Nasal Swab; Sterile Swab  Result Value Ref Range Status   Group A Strep by PCR NOT DETECTED NOT DETECTED Final    Comment: Performed at [facility], [address], [place], [place] [address]  Blood culture (routine x 2)     Status: None (Preliminary result)   Collection Time: 06/19/23 12:50 AM   Specimen: BLOOD  Result Value Ref Range Status   Specimen Description   Final    BLOOD BLOOD RIGHT WRIST Performed at [facility], [address] [place], [place] [address]    Special Requests   Final    BOTTLES DRAWN AEROBIC AND ANAEROBIC Blood Culture results may not be optimal due to an inadequate volume of blood received in culture bottles Performed at [facility], [address], [place], [place] [address]    Culture   Final    NO GROWTH 2 DAYS Performed at [facility], [address] [place], [place] [address]    Report Status PENDING  Incomplete  Blood culture (routine x 2)     Status: None (Preliminary result)   Collection Time: 06/19/23 12:54 AM   Specimen: BLOOD  Result Value Ref Range Status   Specimen Description   Final    BLOOD BLOOD LEFT HAND Performed at [facility], [address], [place], [place] [address]    Special Requests   Final    BOTTLES DRAWN AEROBIC AND ANAEROBIC Blood Culture results may not be optimal due to an inadequate volume of blood received in culture bottles Performed at [facility], [address], [place], [place] [address]    Culture   Final    NO GROWTH 2 DAYS Performed at [facility], [address], [place], [place] [address]    Report Status PENDING  Incomplete      Radiology Studies: No results found.      LOS: 2 days   Kiera Hussey Rito Ehrlich  [facility] Pager on [email or web address]  06/21/2023, 9:38 AM

## 2023-06-21 NOTE — Plan of Care (Signed)
°  Problem: Nutrition: °Goal: Adequate nutrition will be maintained °Outcome: Progressing °  °Problem: Coping: °Goal: Level of anxiety will decrease °Outcome: Progressing °  °Problem: Safety: °Goal: Ability to remain free from injury will improve °Outcome: Progressing °  °

## 2023-06-21 NOTE — Progress Notes (Signed)
PT Cancellation Note  [name] MRN: [id number] DOB: [birth year]   Cancelled Treatment:    Reason Eval/Treat Not Completed: PT screened, no needs identified, will sign off;  pt is total assist for tranfers via lift or 2 person transfer and is a long term care resident at [facility]. Pt was total assist for bed to chair transfers per PT notes in November 2024. Per chart PT/OT signed off d/t diminished progress at facility.  PT signing off, plan for return to LTC facility   [facility] 06/21/2023, 9:43 AM

## 2023-06-22 DIAGNOSIS — R1310 Dysphagia, unspecified: Secondary | ICD-10-CM | POA: Diagnosis not present

## 2023-06-22 DIAGNOSIS — J690 Pneumonitis due to inhalation of food and vomit: Secondary | ICD-10-CM | POA: Diagnosis not present

## 2023-06-22 DIAGNOSIS — I10 Essential (primary) hypertension: Secondary | ICD-10-CM | POA: Diagnosis not present

## 2023-06-22 DIAGNOSIS — E162 Hypoglycemia, unspecified: Secondary | ICD-10-CM | POA: Diagnosis not present

## 2023-06-22 LAB — CBC
HCT: 35.5 % — ABNORMAL LOW (ref 36.0–46.0)
Hemoglobin: 10.9 g/dL — ABNORMAL LOW (ref 12.0–15.0)
MCH: 24.8 pg — ABNORMAL LOW (ref 26.0–34.0)
MCHC: 30.7 g/dL (ref 30.0–36.0)
MCV: 80.7 fL (ref 80.0–100.0)
Platelets: 522 10*3/uL — ABNORMAL HIGH (ref 150–400)
RBC: 4.4 MIL/uL (ref 3.87–5.11)
RDW: 15.3 % (ref 11.5–15.5)
WBC: 8.7 10*3/uL (ref 4.0–10.5)
nRBC: 0 % (ref 0.0–0.2)

## 2023-06-22 LAB — BASIC METABOLIC PANEL
Anion gap: 8 (ref 5–15)
BUN: 12 mg/dL (ref 8–23)
CO2: 23 mmol/L (ref 22–32)
Calcium: 8.6 mg/dL — ABNORMAL LOW (ref 8.9–10.3)
Chloride: 104 mmol/L (ref 98–111)
Creatinine, Ser: 0.72 mg/dL (ref 0.44–1.00)
GFR, Estimated: >60 mL/min (ref >60)
Glucose, Bld: 113 mg/dL — ABNORMAL HIGH (ref 70–99)
Potassium: 3.7 mmol/L (ref 3.5–5.1)
Sodium: 135 mmol/L (ref 135–145)

## 2023-06-22 LAB — GLUCOSE, CAPILLARY
Glucose-Capillary: 98 mg/dL (ref 70–99)
Glucose-Capillary: 101 mg/dL — ABNORMAL HIGH (ref 70–99)
Glucose-Capillary: 121 mg/dL — ABNORMAL HIGH (ref 70–99)
Glucose-Capillary: 158 mg/dL — ABNORMAL HIGH (ref 70–99)

## 2023-06-22 MED ORDER — LOPERAMIDE HCL 2 MG PO CAPS: 4.0000 mg | ORAL_CAPSULE | Freq: Three times a day (TID) | ORAL | Status: DC | PRN

## 2023-06-22 MED ORDER — AMOXICILLIN-POT CLAVULANATE 875-125 MG PO TABS
1.0000 | ORAL_TABLET | Freq: Two times a day (BID) | ORAL | Status: DC
  Administered 2023-06-22 – 2023-06-23 (×2): 1 via ORAL
  Filled 2023-06-22 (×2): qty 1

## 2023-06-22 MED ORDER — AMOXICILLIN-POT CLAVULANATE 875-125 MG PO TABS
1.0000 | ORAL_TABLET | Freq: Every day | ORAL | Status: DC
  Administered 2023-06-22: 1 via ORAL
  Filled 2023-06-22: qty 1

## 2023-06-22 MED ORDER — SACCHAROMYCES BOULARDII 250 MG PO CAPS
250.0000 mg | ORAL_CAPSULE | Freq: Two times a day (BID) | ORAL | Status: DC
  Administered 2023-06-22 – 2023-06-23 (×3): 250 mg via ORAL
  Filled 2023-06-22 (×3): qty 1

## 2023-06-22 NOTE — Progress Notes (Signed)
[facility] PROGRESS NOTE   [name] ZSW:[id number] DOB: [birth year] DOA: 06/18/2023  PCP: Eloisa Northern, MD  Brief History: 89 y.o. female with medical history significant for dementia, atrial fibrillation not anticoagulated, paroxysmal SVT, hypertension, and history of hospital delirium who presents with difficulty swallowing.  She was hospitalized for further management.   Consultants: None  Procedures: None    Subjective/Interval History: Patient noted to be mildly agitated this morning.  Does not appear to be in any discomfort or distress.  Remains confused.    Assessment/Plan:  Concern for dysphagia/Aspiration Pneumonia Patient was sent over due to episodes of cough at the nursing facility along with difficulty swallowing. She was seen by speech therapy and is noted to be on dysphagia 2 diet. Chest x-ray raised concern for developing opacity in the right lung base.  Due to her symptoms of cough she was started on Unasyn. Has been requiring assistance with feeding.  Has had reasonably good oral intake. Mentation has improved though she remains agitated at times likely due to her dementia. Changed to oral antibiotics today.  Respiratory status is stable. Add probiotics.  Hypokalemia Supplemented  Hypoglycemia Likely secondary to poor oral intake.  Seems to have improved.  Continue to monitor for now.  Normocytic anemia No evidence for overt bleeding.  Hemoglobin is stable  Paroxysmal atrial fibrillation Not on anticoagulation due to bleeding risk.  Continue metoprolol.  Essential hypertension Continue amlodipine and metoprolol.  Monitor blood pressures closely.  Occasional high readings noted and likely due to agitation.  Considering her advanced age will not be too aggressive with blood pressure control.  History of dementia She has a history of hospital delirium. Continue with olanzapine which she was taking prior to admission  DVT Prophylaxis: Family  requests stopping Lovenox since she has had bleeding episodes with it previously.  Continue SCDs. Code Status: DNR Family Communication: Daughter being updated on a daily basis. Disposition Plan: From SNF.  Anticipate discharge back to skilled nursing facility in the next 24 to 48 hours.    Medications: Scheduled:  amLODipine  10 mg Oral Daily   latanoprost  1 drop Both Eyes QHS   metoprolol tartrate  50 mg Oral BID   OLANZapine  2.5 mg Oral QHS   saccharomyces boulardii  250 mg Oral BID   Continuous:  ampicillin-sulbactam (UNASYN) IV 3 g (06/22/23 0144)   DUK:GURKYHCWCBJSE **OR** acetaminophen, hydrALAZINE, lip balm, loperamide, phenol, prochlorperazine  Antibiotics: Anti-infectives (From admission, onward)    Start     Dose/Rate Route Frequency Ordered Stop   06/19/23 1400  Ampicillin-Sulbactam (UNASYN) 3 g in sodium chloride 0.9 % 100 mL IVPB        3 g 200 mL/hr over 30 Minutes Intravenous Every 6 hours 06/19/23 1203     06/18/23 2200  azithromycin (ZITHROMAX) 500 mg in sodium chloride 0.9 % 250 mL IVPB  Status:  Discontinued        500 mg 250 mL/hr over 60 Minutes Intravenous  Once 06/18/23 2159 06/19/23 0000   06/18/23 2200  Ampicillin-Sulbactam (UNASYN) 3 g in sodium chloride 0.9 % 100 mL IVPB        3 g 200 mL/hr over 30 Minutes Intravenous  Once 06/18/23 2159 06/19/23 0143       Objective:  Vital Signs  Vitals:   06/21/23 1807 06/21/23 2105 06/21/23 2108 06/22/23 0520  BP: (!) 163/80 (!) 175/82 (!) 175/82 (!) 161/83  Pulse: 95 77 77 71  Resp: 17 19  17  Temp: 98.5 F (36.9 C) 99 F (37.2 C)  97.6 F (36.4 C)  TempSrc: Oral Oral  Oral  SpO2: 99% 99%  100%  Height:        Intake/Output Summary (Last 24 hours) at 06/22/2023 0902 Last data filed at 06/22/2023 0525 Gross per 24 hour  Intake 760 ml  Output 450 ml  Net 310 ml    General appearance: Awake alert.  In no distress.  Confused. Resp: Clear to auscultation bilaterally.  Normal effort Cardio:  S1-S2 is normal regular.  No S3-S4.  No rubs murmurs or bruit GI: Abdomen is soft.  Nontender nondistended.  Bowel sounds are present normal.  No masses organomegaly Extremities: No edema.  Noted to be moving all of her extremities.   Lab Results:  Data Reviewed: I have personally reviewed following labs and reports of the imaging studies  CBC: Recent Labs  Lab 06/18/23 2036 06/19/23 0548 06/20/23 0340 06/22/23 0319  WBC 7.6 6.3 5.5 8.7  NEUTROABS 5.8  --   --   --   HGB 11.9* 10.8* 10.2* 10.9*  HCT 38.1 35.6* 33.0* 35.5*  MCV 81.2 84.2 82.1 80.7  PLT 593* 448* 487* 522*    Basic Metabolic Panel: Recent Labs  Lab 06/18/23 2036 06/19/23 0548 06/20/23 0340 06/21/23 0343 06/22/23 0319  NA 140 137 140 138 135  K 3.6 3.4* 3.6 3.3* 3.7  CL 104 102 107 104 104  CO2 26 23 23 25 23   GLUCOSE 94 102* 74 61* 113*  BUN 14 14 11 10 12   CREATININE 0.74 0.73 0.67 0.71 0.72  CALCIUM 9.1 8.6* 8.5* 8.5* 8.6*  MG  --   --   --  2.1  --     GFR: CrCl cannot be calculated (Unknown ideal weight.).  Liver Function Tests: Recent Labs  Lab 06/18/23 2036  AST 16  ALT 10  ALKPHOS 101  BILITOT 0.4  PROT 7.3  ALBUMIN 3.4*     Recent Results (from the past 240 hours)  Resp panel by RT-PCR (RSV, Flu A&B, Covid) Anterior Nasal Swab     Status: None   Collection Time: 06/18/23  9:59 PM   Specimen: Anterior Nasal Swab  Result Value Ref Range Status   SARS Coronavirus 2 by RT PCR NEGATIVE NEGATIVE Final    Comment: (NOTE) SARS-CoV-2 target nucleic acids are NOT DETECTED.  The SARS-CoV-2 RNA is generally detectable in upper respiratory specimens during the acute phase of infection. The lowest concentration of SARS-CoV-2 viral copies this assay can detect is 138 copies/mL. A negative result does not preclude SARS-Cov-2 infection and should not be used as the sole basis for treatment or other patient management decisions. A negative result may occur with  improper specimen  collection/handling, submission of specimen other than nasopharyngeal swab, presence of viral mutation(s) within the areas targeted by this assay, and inadequate number of viral copies(<138 copies/mL). A negative result must be combined with clinical observations, patient history, and epidemiological information. The expected result is Negative.  Fact Sheet for Patients:  BloggerCourse.com  Fact Sheet for Healthcare Providers:  SeriousBroker.it  This test is no t yet approved or cleared by the [place] FDA and  has been authorized for detection and/or diagnosis of SARS-CoV-2 by FDA under an Emergency Use Authorization (EUA). This EUA will remain  in effect (meaning this test can be used) for the duration of the COVID-19 declaration under Section 564(b)(1) of the Act, 21 U.S.C.section 360bbb-3(b)(1), unless the authorization  is terminated  or revoked sooner.       Influenza A by PCR NEGATIVE NEGATIVE Final   Influenza B by PCR NEGATIVE NEGATIVE Final    Comment: (NOTE) The Xpert Xpress SARS-CoV-2/FLU/RSV plus assay is intended as an aid in the diagnosis of influenza from Nasopharyngeal swab specimens and should not be used as a sole basis for treatment. Nasal washings and aspirates are unacceptable for Xpert Xpress SARS-CoV-2/FLU/RSV testing.  Fact Sheet for Patients: BloggerCourse.com  Fact Sheet for Healthcare Providers: SeriousBroker.it  This test is not yet approved or cleared by the [place] FDA and has been authorized for detection and/or diagnosis of SARS-CoV-2 by FDA under an Emergency Use Authorization (EUA). This EUA will remain in effect (meaning this test can be used) for the duration of the COVID-19 declaration under Section 564(b)(1) of the Act, 21 U.S.C. section 360bbb-3(b)(1), unless the authorization is terminated or revoked.     Resp Syncytial  Virus by PCR NEGATIVE NEGATIVE Final    Comment: (NOTE) Fact Sheet for Patients: BloggerCourse.com  Fact Sheet for Healthcare Providers: SeriousBroker.it  This test is not yet approved or cleared by the [place] FDA and has been authorized for detection and/or diagnosis of SARS-CoV-2 by FDA under an Emergency Use Authorization (EUA). This EUA will remain in effect (meaning this test can be used) for the duration of the COVID-19 declaration under Section 564(b)(1) of the Act, 21 U.S.C. section 360bbb-3(b)(1), unless the authorization is terminated or revoked.  Performed at [facility], [address] [place], [place] [address]   Group A Strep by PCR     Status: None   Collection Time: 06/18/23  9:59 PM   Specimen: Anterior Nasal Swab; Sterile Swab  Result Value Ref Range Status   Group A Strep by PCR NOT DETECTED NOT DETECTED Final    Comment: Performed at [facility], [address], [place], [place] [address]  Blood culture (routine x 2)     Status: None (Preliminary result)   Collection Time: 06/19/23 12:50 AM   Specimen: BLOOD  Result Value Ref Range Status   Specimen Description   Final    BLOOD BLOOD RIGHT WRIST Performed at [facility], [address], [place], [place] [address]    Special Requests   Final    BOTTLES DRAWN AEROBIC AND ANAEROBIC Blood Culture results may not be optimal due to an inadequate volume of blood received in culture bottles Performed at [facility], [address], [place], [place] [address]    Culture   Final    NO GROWTH 2 DAYS Performed at [facility], [address] [place], [place] [address]    Report Status PENDING  Incomplete  Blood culture (routine x 2)     Status: None (Preliminary result)   Collection Time: 06/19/23 12:54 AM   Specimen: BLOOD  Result Value Ref Range Status   Specimen  Description   Final    BLOOD BLOOD LEFT HAND Performed at [facility], [address] [place], [place] [address]    Special Requests   Final    BOTTLES DRAWN AEROBIC AND ANAEROBIC Blood Culture results may not be optimal due to an inadequate volume of blood received in culture bottles Performed at [facility], [address] [place], [place] [address]    Culture   Final    NO GROWTH 2 DAYS Performed at [facility], [address] [place], [place] [address]    Report Status PENDING  Incomplete      Radiology Studies: No results found.      LOS: 3 days   Aloysious Vangieson [employer] on [email or web address]  06/22/2023, 9:02 AM

## 2023-06-22 NOTE — Progress Notes (Signed)
PHARMACY NOTE:  ANTIMICROBIAL RENAL DOSAGE ADJUSTMENT  Current antimicrobial regimen includes a mismatch between antimicrobial dosage and estimated renal function.  As per policy approved by the Pharmacy & Therapeutics and Medical Executive Committees, the antimicrobial dosage will be adjusted accordingly.  Current antimicrobial dosage:  Augmentin 875/125 mg PO daily  Indication: Aspiration Pneumonia   Renal Function:  Estimated Creatinine Clearance: 35.9 mL/min (by C-G formula based on SCr of 0.72 mg/dL).     Antimicrobial dosage has been changed to:  Augmentin 875/125mg  PO BID.  Additional comments:   Thank you for allowing pharmacy to be a part of this patient's care.  Lynann Beaver PharmD, BCPS [facility] main pharmacy [phone] 06/22/2023 3:28 PM

## 2023-06-23 DIAGNOSIS — R1313 Dysphagia, pharyngeal phase: Secondary | ICD-10-CM | POA: Diagnosis not present

## 2023-06-23 LAB — GLUCOSE, CAPILLARY: Glucose-Capillary: 83 mg/dL (ref 70–99)

## 2023-06-23 MED ORDER — ENSURE ENLIVE PO LIQD: 237.0000 mL | Freq: Two times a day (BID) | ORAL | Status: DC

## 2023-06-23 MED ORDER — AMOXICILLIN-POT CLAVULANATE 875-125 MG PO TABS: 1.0000 | ORAL_TABLET | Freq: Two times a day (BID) | ORAL | Status: AC

## 2023-06-23 MED ORDER — SACCHAROMYCES BOULARDII 250 MG PO CAPS: 250.0000 mg | ORAL_CAPSULE | Freq: Two times a day (BID) | ORAL | Status: DC

## 2023-06-23 MED ORDER — ENSURE ENLIVE PO LIQD
237.0000 mL | Freq: Two times a day (BID) | ORAL | Status: DC
Start: 2023-06-23 — End: 2023-06-23

## 2023-06-23 MED ORDER — LOPERAMIDE HCL 2 MG PO CAPS: 4.0000 mg | ORAL_CAPSULE | Freq: Three times a day (TID) | ORAL | Status: DC | PRN

## 2023-06-23 NOTE — Progress Notes (Signed)
[facility] Liaison Note  Notified by [facility] manager of patient/family request for [facility] palliative services at home after discharge.  [facility] hospital liaison will follow patient for discharge disposition.  Please call with any hospice or outpatient palliative care related questions.  Thank you for the opportunity to participate in this patient's care.    Dionicio Stall, [facility] Liaison [phone]

## 2023-06-23 NOTE — [place] FL2 (Signed)
[place] MEDICAID FL2 LEVEL OF CARE FORM     IDENTIFICATION  Patient Name: [name] Birthdate: 12/07/1921 Sex: female Admission Date (Current Location): 06/18/2023  [facility] and [place] Number:  Producer, television/film/video and Address:  [facility],  [address] [place], [place] [address]      Provider Number: [id number]  Attending Physician Name and Address:  [name], MD  Relative Name and Phone Number:  daughter, [name] @ [phone]    Current Level of Care: Hospital Recommended Level of Care: Skilled Nursing Facility Prior Approval Number:    Date Approved/Denied:   PASRR Number: [id number]  Discharge Plan: SNF    Current Diagnoses: Patient Active Problem List   Diagnosis Date Noted   Dysphagia 06/18/2023   Paroxysmal atrial fibrillation (HCC) 04/01/2023   Generalized weakness 04/01/2023   Elevated troponin 04/01/2023   Agitation-secondary to advanced age and a new environment. 04/01/2023   CKD stage 3a, GFR 45-59 ml/min (HCC) 10/23/2021   Routine general medical examination at a health care facility 06/29/2019   Aortic valve regurgitation 07/01/2017   Age-related physical debility 08/10/2014   SVT (supraventricular tachycardia) (HCC) 02/23/2014   DJD (degenerative joint disease) 04/07/2013   Breast cancer- s/p mastectomy 03/11/2013   GERD (gastroesophageal reflux disease) 03/10/2008   Unspecified glaucoma 09/30/2007   Essential hypertension 09/30/2007   History of GI bleed 09/30/2007    Orientation RESPIRATION BLADDER Height & Weight     Self  Normal Incontinent Weight: 139 lb 8.8 oz (63.3 kg) Height:  5\' 6"  (167.6 cm)  BEHAVIORAL SYMPTOMS/MOOD NEUROLOGICAL BOWEL NUTRITION STATUS      Incontinent Diet (Dys 2)  AMBULATORY STATUS COMMUNICATION OF NEEDS Skin   Total Care Verbally PU Stage and Appropriate Care   PU Stage 2 Dressing:  (foam dressing)                   Personal Care Assistance Level of Assistance  Bathing,  Feeding, Dressing, Total care Bathing Assistance: Maximum assistance Feeding assistance: Limited assistance Dressing Assistance: Maximum assistance Total Care Assistance: Maximum assistance   Functional Limitations Info  Sight, Hearing, Speech Sight Info: Adequate Hearing Info: Impaired Speech Info: Adequate    SPECIAL CARE FACTORS FREQUENCY                       Contractures Contractures Info: Not present    Additional Factors Info  Code Status, Allergies Code Status Info: DNR Allergies Info: codeine           Current Medications (06/23/2023):  This is the current hospital active medication list Current Facility-Administered Medications  Medication Dose Route Frequency Provider Last Rate Last Admin   acetaminophen (TYLENOL) tablet 650 mg  650 mg Oral Q6H PRN Opyd, Lavone Neri, MD       Or   acetaminophen (TYLENOL) suppository 650 mg  650 mg Rectal Q6H PRN Opyd, Lavone Neri, MD       amLODipine (NORVASC) tablet 10 mg  10 mg Oral Daily [name], MD   10 mg at 06/22/23 1102   amoxicillin-clavulanate (AUGMENTIN) 875-125 MG per tablet 1 tablet  1 tablet Oral Q12H Shade, Jacqulyn Cane, RPH   1 tablet at 06/22/23 2051   feeding supplement (ENSURE ENLIVE / ENSURE PLUS) liquid 237 mL  237 mL Oral BID BM [name], MD       hydrALAZINE (APRESOLINE) injection 5 mg  5 mg Intravenous Q4H PRN Opyd, Lavone Neri, MD   5  mg at 06/19/23 1506   latanoprost (XALATAN) 0.005 % ophthalmic solution 1 drop  1 drop Both Eyes QHS [name], MD   1 drop at 06/22/23 2200   lip balm (CARMEX) ointment   Topical PRN [name], MD   1 Application at 06/21/23 0836   loperamide (IMODIUM) capsule 4 mg  4 mg Oral TID PRN [name], MD       metoprolol tartrate (LOPRESSOR) tablet 50 mg  50 mg Oral BID [name], MD   50 mg at 06/22/23 2051   OLANZapine (ZYPREXA) tablet 2.5 mg  2.5 mg Oral QHS [name], MD   2.5 mg at 06/22/23 2051   phenol (CHLORASEPTIC) mouth spray 1  spray  1 spray Mouth/Throat PRN [name], MD       prochlorperazine (COMPAZINE) injection 5 mg  5 mg Intravenous Q6H PRN Opyd, Lavone Neri, MD       saccharomyces boulardii (FLORASTOR) capsule 250 mg  250 mg Oral BID [name], MD   250 mg at 06/22/23 2051     Discharge Medications: Please see discharge summary for a list of discharge medications.  Relevant Imaging Results:  Relevant Lab Results:   Additional Information SSN: [id number]  [name], LCSW

## 2023-06-23 NOTE — Discharge Summary (Signed)
[facility]  Physician Discharge Summary   Patient ID: [name] MRN: [id number] DOB/AGE: April 23, 1922 89 y.o.  Admit date: 06/18/2023 Discharge date:   06/23/2023   PCP: Eloisa Northern, MD  DISCHARGE DIAGNOSES:    Dysphagia Aspiration pneumonia   Paroxysmal atrial fibrillation (HCC)   Essential hypertension Dementia Poor oral intake   RECOMMENDATIONS FOR OUTPATIENT FOLLOW UP: Please have palliative care see the patient in the SNF for goals of care conversation    Home Health: None Equipment/Devices: None  CODE STATUS: DNR  DISCHARGE CONDITION: fair  Diet recommendation: Dysphagia 2 diet  INITIAL HISTORY: 89 y.o. female with medical history significant for dementia, atrial fibrillation not anticoagulated, paroxysmal SVT, hypertension, and history of hospital delirium who presents with difficulty swallowing.  She was hospitalized for further management.   HOSPITAL COURSE:   Concern for dysphagia/Aspiration Pneumonia Patient was sent over due to episodes of cough at the nursing facility along with difficulty swallowing. She was seen by speech therapy and was placed on dysphagia 2 diet. Chest x-ray raised concern for developing opacity in the right lung base.  Due to her symptoms of cough she was started on Unasyn. Oral intake has been reasonable.  She eats certain meals very well and other meals not so much.  This was discussed with her daughter.  She was told to monitor her dietary intake at skilled nursing facility and if it remains a problem then might be time to consider goals of care considering her advanced age and dementia. Mentation has improved though she remains agitated at times likely due to her dementia. She will be discharged on oral antibiotics.     Hypokalemia Supplemented   Hypoglycemia Likely secondary to poor oral intake.  Seems to have improved.  Continue to monitor for now.   Normocytic anemia No evidence for overt bleeding.   Hemoglobin is stable   Paroxysmal atrial fibrillation Not on anticoagulation due to bleeding risk.  Continue metoprolol.   Essential hypertension Continue amlodipine and metoprolol.  Monitor blood pressures closely.  Occasional high readings noted and likely due to agitation.  Considering her advanced age will not be too aggressive with blood pressure control.   History of dementia She has a history of hospital delirium. Continue with olanzapine which she was taking prior to admission Might benefit from palliative care input at skilled nursing facility for goals of care.  Stage II sacral decubitus Pressure Injury 06/19/23 Sacrum Mid Stage 2 -  Partial thickness loss of dermis presenting as a shallow open injury with a red, pink wound bed without slough. (Active)  06/19/23 1315  Location: Sacrum  Location Orientation: Mid  Staging: Stage 2 -  Partial thickness loss of dermis presenting as a shallow open injury with a red, pink wound bed without slough.  Wound Description (Comments):   Present on Admission: Yes   Patient is stable.  Okay for discharge back to SNF today.   PERTINENT LABS:  The results of significant diagnostics from this hospitalization (including imaging, microbiology, ancillary and laboratory) are listed below for reference.    Microbiology: Recent Results (from the past 240 hours)  Resp panel by RT-PCR (RSV, Flu A&B, Covid) Anterior Nasal Swab     Status: None   Collection Time: 06/18/23  9:59 PM   Specimen: Anterior Nasal Swab  Result Value Ref Range Status   SARS Coronavirus 2 by RT PCR NEGATIVE NEGATIVE Final    Comment: (NOTE) SARS-CoV-2 target nucleic acids are NOT DETECTED.  The SARS-CoV-2  RNA is generally detectable in upper respiratory specimens during the acute phase of infection. The lowest concentration of SARS-CoV-2 viral copies this assay can detect is 138 copies/mL. A negative result does not preclude SARS-Cov-2 infection and should not be  used as the sole basis for treatment or other patient management decisions. A negative result may occur with  improper specimen collection/handling, submission of specimen other than nasopharyngeal swab, presence of viral mutation(s) within the areas targeted by this assay, and inadequate number of viral copies(<138 copies/mL). A negative result must be combined with clinical observations, patient history, and epidemiological information. The expected result is Negative.  Fact Sheet for Patients:  BloggerCourse.com  Fact Sheet for Healthcare Providers:  SeriousBroker.it  This test is no t yet approved or cleared by the [place] FDA and  has been authorized for detection and/or diagnosis of SARS-CoV-2 by FDA under an Emergency Use Authorization (EUA). This EUA will remain  in effect (meaning this test can be used) for the duration of the COVID-19 declaration under Section 564(b)(1) of the Act, 21 U.S.C.section 360bbb-3(b)(1), unless the authorization is terminated  or revoked sooner.       Influenza A by PCR NEGATIVE NEGATIVE Final   Influenza B by PCR NEGATIVE NEGATIVE Final    Comment: (NOTE) The Xpert Xpress SARS-CoV-2/FLU/RSV plus assay is intended as an aid in the diagnosis of influenza from Nasopharyngeal swab specimens and should not be used as a sole basis for treatment. Nasal washings and aspirates are unacceptable for Xpert Xpress SARS-CoV-2/FLU/RSV testing.  Fact Sheet for Patients: BloggerCourse.com  Fact Sheet for Healthcare Providers: SeriousBroker.it  This test is not yet approved or cleared by the [place] FDA and has been authorized for detection and/or diagnosis of SARS-CoV-2 by FDA under an Emergency Use Authorization (EUA). This EUA will remain in effect (meaning this test can be used) for the duration of the COVID-19 declaration under Section  564(b)(1) of the Act, 21 U.S.C. section 360bbb-3(b)(1), unless the authorization is terminated or revoked.     Resp Syncytial Virus by PCR NEGATIVE NEGATIVE Final    Comment: (NOTE) Fact Sheet for Patients: BloggerCourse.com  Fact Sheet for Healthcare Providers: SeriousBroker.it  This test is not yet approved or cleared by the [place] FDA and has been authorized for detection and/or diagnosis of SARS-CoV-2 by FDA under an Emergency Use Authorization (EUA). This EUA will remain in effect (meaning this test can be used) for the duration of the COVID-19 declaration under Section 564(b)(1) of the Act, 21 U.S.C. section 360bbb-3(b)(1), unless the authorization is terminated or revoked.  Performed at [facility], [address] [place], [place] [address]   Group A Strep by PCR     Status: None   Collection Time: 06/18/23  9:59 PM   Specimen: Anterior Nasal Swab; Sterile Swab  Result Value Ref Range Status   Group A Strep by PCR NOT DETECTED NOT DETECTED Final    Comment: Performed at [facility], [address], [place], [place] [address]  Blood culture (routine x 2)     Status: None (Preliminary result)   Collection Time: 06/19/23 12:50 AM   Specimen: BLOOD  Result Value Ref Range Status   Specimen Description   Final    BLOOD BLOOD RIGHT WRIST Performed at [facility], [address] [place], [place] [address]    Special Requests   Final    BOTTLES DRAWN AEROBIC AND ANAEROBIC Blood Culture results may not be  optimal due to an inadequate volume of blood received in culture bottles Performed at [facility], [address], [place], [place] [address]    Culture   Final    NO GROWTH 3 DAYS Performed at [facility], [address], [place], [place] [address]    Report Status PENDING  Incomplete  Blood culture (routine x 2)     Status:  None (Preliminary result)   Collection Time: 06/19/23 12:54 AM   Specimen: BLOOD  Result Value Ref Range Status   Specimen Description   Final    BLOOD BLOOD LEFT HAND Performed at [facility], [address], [place], [place] [address]    Special Requests   Final    BOTTLES DRAWN AEROBIC AND ANAEROBIC Blood Culture results may not be optimal due to an inadequate volume of blood received in culture bottles Performed at [facility], [address] [place], [place] [address]    Culture   Final    NO GROWTH 3 DAYS Performed at [facility], [address] [place], [place] [address]    Report Status PENDING  Incomplete     Labs:   Basic Metabolic Panel: Recent Labs  Lab 06/18/23 2036 06/19/23 0548 06/20/23 0340 06/21/23 0343 06/22/23 0319  NA 140 137 140 138 135  K 3.6 3.4* 3.6 3.3* 3.7  CL 104 102 107 104 104  CO2 26 23 23 25 23   GLUCOSE 94 102* 74 61* 113*  BUN 14 14 11 10 12   CREATININE 0.74 0.73 0.67 0.71 0.72  CALCIUM 9.1 8.6* 8.5* 8.5* 8.6*  MG  --   --   --  2.1  --    Liver Function Tests: Recent Labs  Lab 06/18/23 2036  AST 16  ALT 10  ALKPHOS 101  BILITOT 0.4  PROT 7.3  ALBUMIN 3.4*    CBC: Recent Labs  Lab 06/18/23 2036 06/19/23 0548 06/20/23 0340 06/22/23 0319  WBC 7.6 6.3 5.5 8.7  NEUTROABS 5.8  --   --   --   HGB 11.9* 10.8* 10.2* 10.9*  HCT 38.1 35.6* 33.0* 35.5*  MCV 81.2 84.2 82.1 80.7  PLT 593* 448* 487* 522*    CBG: Recent Labs  Lab 06/22/23 0756 06/22/23 1152 06/22/23 1614 06/22/23 2126 06/23/23 0732  GLUCAP 98 101* 121* 158* 83     IMAGING STUDIES CT Head Wo Contrast Result Date: 06/18/2023 CLINICAL DATA:  Altered mental status, nontraumatic (Ped 0-17y) EXAM: CT HEAD WITHOUT CONTRAST TECHNIQUE: Contiguous axial images were obtained from the base of the skull through the vertex without intravenous contrast. RADIATION DOSE REDUCTION: This exam was performed according to the  departmental dose-optimization program which includes automated exposure control, adjustment of the mA and/or kV according to patient size and/or use of iterative reconstruction technique. COMPARISON:  CT head 04/01/2023 FINDINGS: Brain: Patchy and confluent areas of decreased attenuation are noted throughout the deep and periventricular white matter of the cerebral hemispheres bilaterally, compatible with chronic microvascular ischemic disease. No evidence of large-territorial acute infarction. No parenchymal hemorrhage. No mass lesion. No extra-axial collection. No mass effect or midline shift. No hydrocephalus. Basilar cisterns are patent. Vascular: No hyperdense vessel. Skull: No acute fracture or focal lesion. Sinuses/Orbits: Paranasal sinuses and mastoid air cells are clear. Bilateral lens replacement. The orbits are unremarkable. Other: None. IMPRESSION: No acute intracranial abnormality. Electronically Signed   By: Tish Frederickson M.D.   On: 06/18/2023 22:17   DG  Chest Portable 1 View Result Date: 06/18/2023 CLINICAL DATA:  ams, weakness EXAM: PORTABLE CHEST 1 VIEW COMPARISON:  Chest x-ray 03/31/2023 FINDINGS: Patient is rotated. The heart and mediastinal contours are unchanged. Atherosclerotic plaque. Question developing right lower lobe airspace opacity. No pulmonary edema. No pleural effusion. No pneumothorax. No acute osseous abnormality. Bilateral shoulder degenerative changes. There IMPRESSION: 1. Question developing right lower lobe airspace opacity. Recommend repeat chest x-ray PA and lateral view of the chest with improved patient positioning. 2.  Aortic Atherosclerosis (ICD10-I70.0). Electronically Signed   By: Tish Frederickson M.D.   On: 06/18/2023 22:14    DISCHARGE EXAMINATION: Vitals:   06/22/23 1532 06/22/23 2051 06/22/23 2124 06/23/23 0548  BP:  (!) 143/74 133/75 (!) 145/82  Pulse:  96 71 (!) 59  Resp:   17 17  Temp:   97.7 F (36.5 C) (!) 97.5 F (36.4 C)  TempSrc:   Oral Oral   SpO2:   100% 100%  Weight: 63.3 kg     Height:       General appearance: Awake alert.  In no distress.  Pleasantly confused Resp: Clear to auscultation bilaterally.  Normal effort Cardio: S1-S2 is normal regular.  No S3-S4.  No rubs murmurs or bruit GI: Abdomen is soft.  Nontender nondistended.  Bowel sounds are present normal.  No masses organomegaly    DISPOSITION: SNF  Discharge Instructions     Call MD for:  difficulty breathing, headache or visual disturbances   Complete by: As directed    Call MD for:  extreme fatigue   Complete by: As directed    Call MD for:  persistant dizziness or light-headedness   Complete by: As directed    Call MD for:  persistant nausea and vomiting   Complete by: As directed    Call MD for:  severe uncontrolled pain   Complete by: As directed    Call MD for:  temperature >100.4   Complete by: As directed    Discharge instructions   Complete by: As directed    Please review instructions on the discharge summary.  You were cared for by a hospitalist during your hospital stay. If you have any questions about your discharge medications or the care you received while you were in the hospital after you are discharged, you can call the unit and asked to speak with the hospitalist on call if the hospitalist that took care of you is not available. Once you are discharged, your primary care physician will handle any further medical issues. Please note that NO REFILLS for any discharge medications will be authorized once you are discharged, as it is imperative that you return to your primary care physician (or establish a relationship with a primary care physician if you do not have one) for your aftercare needs so that they can reassess your need for medications and monitor your lab values. If you do not have a primary care physician, you can call [phone] for a physician referral.   Increase activity slowly   Complete by: As directed    No wound care    Complete by: As directed          Allergies as of 06/23/2023       Reactions   Codeine Nausea And Vomiting        Medication List     TAKE these medications    acetaminophen 500 MG tablet Commonly known as: TYLENOL Take 2 tablets (1,000 mg total) by mouth every 6 (six)  hours as needed for moderate pain (for pain). What changed: when to take this   amLODipine 10 MG tablet Commonly known as: NORVASC TAKE 1 TABLET(10 MG) BY MOUTH DAILY   amoxicillin-clavulanate 875-125 MG tablet Commonly known as: AUGMENTIN Take 1 tablet by mouth every 12 (twelve) hours for 5 days.   cyanocobalamin 1000 MCG/ML injection Commonly known as: VITAMIN B12 Inject 1,000 mcg into the muscle every 7 (seven) days.   cyanocobalamin 1000 MCG tablet Commonly known as: VITAMIN B12 Take 1 tablet (1,000 mcg total) by mouth daily.   diclofenac sodium 1 % Gel Commonly known as: VOLTAREN Apply 2 g topically See admin instructions. Apply 2 grams to both knees for pain four times a day What changed:  how much to take when to take this additional instructions   feeding supplement Liqd Take 237 mLs by mouth 2 (two) times daily between meals.   FeroSul 325 (65 FE) MG tablet Generic drug: ferrous sulfate Take 325 mg by mouth at bedtime.   latanoprost 0.005 % ophthalmic solution Commonly known as: XALATAN Place 1 drop into both eyes at bedtime.   loperamide 2 MG capsule Commonly known as: IMODIUM Take 2 capsules (4 mg total) by mouth 3 (three) times daily as needed for diarrhea or loose stools.   metoprolol tartrate 50 MG tablet Commonly known as: LOPRESSOR Take 1 tablet (50 mg total) by mouth 2 (two) times daily.   OLANZapine 2.5 MG tablet Commonly known as: ZYPREXA Take 1 tablet (2.5 mg total) by mouth at bedtime.   polyethylene glycol 17 g packet Commonly known as: MIRALAX / GLYCOLAX Take 17 g by mouth as needed for mild constipation or moderate constipation.   polyethylene glycol 17  g packet Commonly known as: MiraLax Take 17 g by mouth daily.   Protonix 40 mg Generic drug: pantoprazole sodium Take 40 mg by mouth daily. What changed: Another medication with the same name was removed. Continue taking this medication, and follow the directions you see here.   saccharomyces boulardii 250 MG capsule Commonly known as: FLORASTOR Take 1 capsule (250 mg total) by mouth 2 (two) times daily.   thiamine 100 MG tablet Commonly known as: VITAMIN B1 Take 1 tablet (100 mg total) by mouth daily.   triamcinolone cream 0.1 % Commonly known as: KENALOG Apply 1 Application topically 2 (two) times daily.   vitamin C 1000 MG tablet Take 1,000 mg by mouth at bedtime.   Vitamin D (Ergocalciferol) 1.25 MG (50000 UNIT) Caps capsule Commonly known as: DRISDOL Take 1 capsule (50,000 Units total) by mouth every 7 (seven) days.           TOTAL DISCHARGE TIME: 35 minutes  Yasenia Reedy [employer] on [email or web address]  06/23/2023, 9:05 AM

## 2023-06-23 NOTE — Progress Notes (Signed)
Patient was picked up by [facility] and given the discharge packet. She is going to [place] and was stable for discharge. Report was given to [facility].

## 2023-06-23 NOTE — TOC Transition Note (Signed)
Transition of Care [facility]) - Discharge Note   Patient Details  Name: [name] MRN: [id number] Date of Birth: 1922/04/14  [facility]) CM/SW Contact:  Amada Jupiter, LCSW Phone Number: 06/23/2023, 10:44 AM   Clinical Narrative:     Pt medically cleared for dc today and have confirmed with [facility] that they can accept pt back into LTC skilled bed.  Spoke with daughter, [name], who is aware and agreeable.  Discussed possible referral to community palliative care to follow pt at SNF and daughter agreeable with no agency preference.  Referral placed with [facility] for palliative services at [facility] (facility aware as well).    PT[facility] called at 10:45am.   RN to call report to [phone].  No further TOC needs.  Final next level of care: Skilled Nursing Facility Barriers to Discharge: Barriers Resolved   Patient Goals and CMS Choice            Discharge Placement              Patient chooses bed at: [facility] Patient to be transferred to facility by: [facility] Name of family member notified: daughter, [name] Patient and family notified of of transfer: 06/23/23  Discharge Plan and Services Additional resources added to the After Visit Summary for                  DME Arranged: N/A DME Agency: NA                  Social Drivers of Health (SDOH) Interventions SDOH Screenings   Food Insecurity: No Food Insecurity (06/19/2023)  Housing: High Risk (06/19/2023)  Transportation Needs: No Transportation Needs (06/19/2023)  Utilities: Not At Risk (06/19/2023)  Depression (PHQ2-9): Medium Risk (11/01/2021)  Financial Resource Strain: Low Risk  (08/18/2017)  Physical Activity: Inactive (08/18/2017)  Social Connections: Moderately Isolated (06/19/2023)  Stress: No Stress Concern Present (08/18/2017)  Tobacco Use: Low Risk  (06/19/2023)     Readmission Risk Interventions    06/23/2023    9:56 AM  Readmission Risk Prevention Plan  Transportation Screening  Complete  PCP or Specialist Appt within 5-7 Days Complete  Home Care Screening Complete  Medication Review (RN CM) Complete

## 2023-06-24 LAB — CULTURE, BLOOD (ROUTINE X 2)
Culture: NO GROWTH
Culture: NO GROWTH

## 2023-06-26 DIAGNOSIS — L89152 Pressure ulcer of sacral region, stage 2: Secondary | ICD-10-CM | POA: Diagnosis not present

## 2023-06-26 DIAGNOSIS — I150 Renovascular hypertension: Secondary | ICD-10-CM | POA: Diagnosis not present

## 2023-06-26 DIAGNOSIS — R1310 Dysphagia, unspecified: Secondary | ICD-10-CM | POA: Diagnosis not present

## 2023-06-26 DIAGNOSIS — Z8639 Personal history of other endocrine, nutritional and metabolic disease: Secondary | ICD-10-CM | POA: Diagnosis not present

## 2023-06-26 DIAGNOSIS — R5381 Other malaise: Secondary | ICD-10-CM | POA: Diagnosis not present

## 2023-06-26 DIAGNOSIS — F03B11 Unspecified dementia, moderate, with agitation: Secondary | ICD-10-CM | POA: Diagnosis not present

## 2023-06-26 DIAGNOSIS — D649 Anemia, unspecified: Secondary | ICD-10-CM | POA: Diagnosis not present

## 2023-06-26 DIAGNOSIS — I480 Paroxysmal atrial fibrillation: Secondary | ICD-10-CM | POA: Diagnosis not present

## 2023-06-30 ENCOUNTER — Encounter (HOSPITAL_COMMUNITY): Payer: Self-pay | Admitting: Internal Medicine

## 2023-06-30 ENCOUNTER — Emergency Department (HOSPITAL_COMMUNITY): Payer: Medicare Other

## 2023-06-30 ENCOUNTER — Inpatient Hospital Stay (HOSPITAL_COMMUNITY)
Admission: EM | Admit: 2023-06-30 | Discharge: 2023-07-03 | DRG: 065 | Disposition: A | Discharge status: Hospice | Payer: Medicare Other | Attending: Family Medicine | Admitting: Family Medicine

## 2023-06-30 ENCOUNTER — Other Ambulatory Visit: Payer: Self-pay

## 2023-06-30 DIAGNOSIS — F0390 Unspecified dementia without behavioral disturbance: Secondary | ICD-10-CM | POA: Diagnosis present

## 2023-06-30 DIAGNOSIS — Z885 Allergy status to narcotic agent status: Secondary | ICD-10-CM | POA: Diagnosis not present

## 2023-06-30 DIAGNOSIS — Z66 Do not resuscitate: Secondary | ICD-10-CM | POA: Diagnosis not present

## 2023-06-30 DIAGNOSIS — I639 Cerebral infarction, unspecified: Principal | ICD-10-CM | POA: Diagnosis present

## 2023-06-30 DIAGNOSIS — Z515 Encounter for palliative care: Secondary | ICD-10-CM | POA: Diagnosis not present

## 2023-06-30 DIAGNOSIS — Z79899 Other long term (current) drug therapy: Secondary | ICD-10-CM

## 2023-06-30 DIAGNOSIS — G8191 Hemiplegia, unspecified affecting right dominant side: Secondary | ICD-10-CM | POA: Diagnosis present

## 2023-06-30 DIAGNOSIS — Z7189 Other specified counseling: Secondary | ICD-10-CM | POA: Diagnosis not present

## 2023-06-30 DIAGNOSIS — I482 Chronic atrial fibrillation, unspecified: Secondary | ICD-10-CM | POA: Diagnosis not present

## 2023-06-30 DIAGNOSIS — R29726 NIHSS score 26: Secondary | ICD-10-CM | POA: Diagnosis present

## 2023-06-30 DIAGNOSIS — I10 Essential (primary) hypertension: Secondary | ICD-10-CM | POA: Diagnosis present

## 2023-06-30 DIAGNOSIS — Z789 Other specified health status: Secondary | ICD-10-CM | POA: Diagnosis not present

## 2023-06-30 DIAGNOSIS — I63312 Cerebral infarction due to thrombosis of left middle cerebral artery: Secondary | ICD-10-CM | POA: Diagnosis not present

## 2023-06-30 DIAGNOSIS — R4701 Aphasia: Secondary | ICD-10-CM | POA: Diagnosis present

## 2023-06-30 DIAGNOSIS — H409 Unspecified glaucoma: Secondary | ICD-10-CM | POA: Diagnosis present

## 2023-06-30 DIAGNOSIS — N1831 Chronic kidney disease, stage 3a: Secondary | ICD-10-CM | POA: Diagnosis present

## 2023-06-30 DIAGNOSIS — R000 Tachycardia, unspecified: Secondary | ICD-10-CM | POA: Diagnosis not present

## 2023-06-30 DIAGNOSIS — R404 Transient alteration of awareness: Secondary | ICD-10-CM | POA: Diagnosis not present

## 2023-06-30 DIAGNOSIS — I6350 Cerebral infarction due to unspecified occlusion or stenosis of unspecified cerebral artery: Principal | ICD-10-CM | POA: Diagnosis present

## 2023-06-30 DIAGNOSIS — R29810 Facial weakness: Secondary | ICD-10-CM | POA: Diagnosis present

## 2023-06-30 DIAGNOSIS — Z8249 Family history of ischemic heart disease and other diseases of the circulatory system: Secondary | ICD-10-CM

## 2023-06-30 DIAGNOSIS — I480 Paroxysmal atrial fibrillation: Secondary | ICD-10-CM | POA: Diagnosis not present

## 2023-06-30 DIAGNOSIS — R1310 Dysphagia, unspecified: Secondary | ICD-10-CM | POA: Diagnosis present

## 2023-06-30 DIAGNOSIS — Z853 Personal history of malignant neoplasm of breast: Secondary | ICD-10-CM

## 2023-06-30 DIAGNOSIS — R627 Adult failure to thrive: Secondary | ICD-10-CM | POA: Diagnosis not present

## 2023-06-30 DIAGNOSIS — I4719 Other supraventricular tachycardia: Secondary | ICD-10-CM | POA: Diagnosis not present

## 2023-06-30 LAB — I-STAT CHEM 8, ED
BUN: 17 mg/dL (ref 8–23)
Calcium, Ion: 1.12 mmol/L — ABNORMAL LOW (ref 1.15–1.40)
Chloride: 106 mmol/L (ref 98–111)
Creatinine, Ser: 0.8 mg/dL (ref 0.44–1.00)
Glucose, Bld: 107 mg/dL — ABNORMAL HIGH (ref 70–99)
HCT: 33 % — ABNORMAL LOW (ref 36.0–46.0)
Hemoglobin: 11.2 g/dL — ABNORMAL LOW (ref 12.0–15.0)
Potassium: 3.5 mmol/L (ref 3.5–5.1)
Sodium: 141 mmol/L (ref 135–145)
TCO2: 24 mmol/L (ref 22–32)

## 2023-06-30 LAB — CBG MONITORING, ED: Glucose-Capillary: 110 mg/dL — ABNORMAL HIGH (ref 70–99)

## 2023-06-30 MED ORDER — HALOPERIDOL 0.5 MG PO TABS: 0.5000 mg | ORAL_TABLET | ORAL | Status: DC | PRN

## 2023-06-30 MED ORDER — BIOTENE DRY MOUTH MT LIQD: 15.0000 mL | OROMUCOSAL | Status: DC | PRN

## 2023-06-30 MED ORDER — LORAZEPAM 2 MG/ML IJ SOLN
1.0000 mg | INTRAMUSCULAR | Status: DC | PRN
  Administered 2023-07-01 – 2023-07-03 (×2): 1 mg via INTRAVENOUS
  Filled 2023-06-30 (×2): qty 1

## 2023-06-30 MED ORDER — GLYCOPYRROLATE 0.2 MG/ML IJ SOLN: 0.2000 mg | INTRAMUSCULAR | Status: DC | PRN

## 2023-06-30 MED ORDER — LORAZEPAM 2 MG/ML PO CONC: 1.0000 mg | ORAL | Status: DC | PRN

## 2023-06-30 MED ORDER — MORPHINE SULFATE (CONCENTRATE) 10 MG /0.5 ML PO SOLN
5.0000 mg | ORAL | Status: DC | PRN
  Administered 2023-07-01: 5 mg via SUBLINGUAL
  Filled 2023-06-30: qty 0.5

## 2023-06-30 MED ORDER — MORPHINE SULFATE (CONCENTRATE) 10 MG /0.5 ML PO SOLN
5.0000 mg | ORAL | Status: DC | PRN
  Administered 2023-06-30: 5 mg via ORAL
  Filled 2023-06-30: qty 0.5

## 2023-06-30 MED ORDER — GLYCOPYRROLATE 1 MG PO TABS: 1.0000 mg | ORAL_TABLET | ORAL | Status: DC | PRN

## 2023-06-30 MED ORDER — ONDANSETRON 4 MG PO TBDP: 4.0000 mg | ORAL_TABLET | Freq: Four times a day (QID) | ORAL | Status: DC | PRN

## 2023-06-30 MED ORDER — ACETAMINOPHEN 325 MG PO TABS: 650.0000 mg | ORAL_TABLET | Freq: Four times a day (QID) | ORAL | Status: DC | PRN

## 2023-06-30 MED ORDER — HALOPERIDOL LACTATE 5 MG/ML IJ SOLN: 0.5000 mg | INTRAMUSCULAR | Status: DC | PRN

## 2023-06-30 MED ORDER — LORAZEPAM 1 MG PO TABS
1.0000 mg | ORAL_TABLET | ORAL | Status: DC | PRN
  Filled 2023-06-30: qty 1

## 2023-06-30 MED ORDER — HALOPERIDOL LACTATE 2 MG/ML PO CONC: 0.5000 mg | ORAL | Status: DC | PRN

## 2023-06-30 MED ORDER — ONDANSETRON HCL 4 MG/2ML IJ SOLN: 4.0000 mg | Freq: Four times a day (QID) | INTRAMUSCULAR | Status: DC | PRN

## 2023-06-30 MED ORDER — POLYVINYL ALCOHOL 1.4 % OP SOLN: 1.0000 [drp] | Freq: Four times a day (QID) | OPHTHALMIC | Status: DC | PRN

## 2023-06-30 MED ORDER — ACETAMINOPHEN 650 MG RE SUPP: 650.0000 mg | Freq: Four times a day (QID) | RECTAL | Status: DC | PRN

## 2023-06-30 MED ORDER — SODIUM CHLORIDE 0.9% FLUSH
3.0000 mL | Freq: Once | INTRAVENOUS | Status: AC
  Administered 2023-07-03: 3 mL via INTRAVENOUS

## 2023-06-30 NOTE — Progress Notes (Incomplete)
No IV access at this time, multiple attempts bystaff both with and without ultrasound.

## 2023-06-30 NOTE — ED Provider Notes (Addendum)
[name] Provider Note   CSN: [id number] Arrival date & time: 06/30/23  1124  An emergency department physician performed an initial assessment on this suspected stroke patient at 1125.  History  No chief complaint on file.   [name] is a 89 y.o. female.  HPI   89 year old female presenting from skilled nursing facility, DNR in place with a report of last normal around 1030 with a chief complaint of nonverbal speech, drooling, right-sided hemibody weakness, left gaze preference.  Patient presented as code stroke, was evaluated at the bridge and her airway was cleared, CBG 130 and she was brought to the CT imager for code stroke imaging.  Home Medications Prior to Admission medications   Medication Sig Start Date End Date Taking? Authorizing Provider  acetaminophen (TYLENOL) 500 MG tablet Take 2 tablets (1,000 mg total) by mouth every 6 (six) hours as needed for moderate pain (for pain). Patient taking differently: Take 1,000 mg by mouth as needed for moderate pain (pain score 4-6) (for pain). 01/03/21   [name], PA-C  amLODipine (NORVASC) 10 MG tablet TAKE 1 TABLET(10 MG) BY MOUTH DAILY 10/29/22   [name], MD  Ascorbic Acid (VITAMIN C) 1000 MG tablet Take 1,000 mg by mouth at bedtime.    [provider]  cyanocobalamin (VITAMIN B12) 1000 MCG tablet Take 1 tablet (1,000 mcg total) by mouth daily. Patient not taking: Reported on 06/18/2023 04/04/23   [name], [name], MD  cyanocobalamin (VITAMIN B12) 1000 MCG/ML injection Inject 1,000 mcg into the muscle every 7 (seven) days.    [provider]  diclofenac sodium (VOLTAREN) 1 % GEL Apply 2 g topically See admin instructions. Apply 2 grams to both knees for pain four times a day Patient taking differently: Apply 1 Application topically 4 (four) times daily. 10/31/17   [name], MD  feeding supplement (ENSURE ENLIVE / ENSURE PLUS) LIQD Take 237  mLs by mouth 2 (two) times daily between meals. 06/23/23   [name], MD  ferrous sulfate (FEROSUL) 325 (65 FE) MG tablet Take 325 mg by mouth at bedtime.    [provider]  latanoprost (XALATAN) 0.005 % ophthalmic solution Place 1 drop into both eyes at bedtime. 06/17/18   [provider]  loperamide (IMODIUM) 2 MG capsule Take 2 capsules (4 mg total) by mouth 3 (three) times daily as needed for diarrhea or loose stools. 06/23/23   [name], MD  metoprolol tartrate (LOPRESSOR) 50 MG tablet Take 1 tablet (50 mg total) by mouth 2 (two) times daily. 08/26/22   [name], MD  OLANZapine (ZYPREXA) 2.5 MG tablet Take 1 tablet (2.5 mg total) by mouth at bedtime. 04/02/23   [name], MD  pantoprazole sodium (PROTONIX) 40 mg Take 40 mg by mouth daily.    [provider]  polyethylene glycol (MIRALAX / GLYCOLAX) 17 g packet Take 17 g by mouth as needed for mild constipation or moderate constipation.    [provider]  polyethylene glycol (MIRALAX) 17 g packet Take 17 g by mouth daily. Patient not taking: Reported on 06/18/2023 04/04/23   [name], [name], MD  saccharomyces boulardii (FLORASTOR) 250 MG capsule Take 1 capsule (250 mg total) by mouth 2 (two) times daily. 06/23/23   [name], MD  thiamine (VITAMIN B1) 100 MG tablet Take 1 tablet (100 mg total) by mouth daily. 04/04/23   [name], [name], MD  triamcinolone cream (KENALOG) 0.1 %  Apply 1 Application topically 2 (two) times daily. Patient not taking: Reported on 06/18/2023 08/26/22   [name], MD  Vitamin D, Ergocalciferol, (DRISDOL) 1.25 MG (50000 UNIT) CAPS capsule Take 1 capsule (50,000 Units total) by mouth every 7 (seven) days. 04/03/23   [name], [name], MD      Allergies    Codeine    Review of Systems   Review of Systems  Unable to perform ROS: Acuity of condition    Physical Exam Updated Vital Signs BP (!) 152/73   Pulse 87   Temp (!) 97.1 F  (36.2 C)   Resp 20   Wt 68 kg   SpO2 99%   BMI 24.20 kg/m  Physical Exam Vitals and nursing note reviewed.  Constitutional:      General: She is not in acute distress.    Comments: Nonverbal, alert, gaze preference to the left  HENT:     Head: Normocephalic and atraumatic.  Eyes:     Conjunctiva/sclera: Conjunctivae normal.     Pupils: Pupils are equal, round, and reactive to light.  Cardiovascular:     Rate and Rhythm: Normal rate and regular rhythm.  Pulmonary:     Effort: Pulmonary effort is normal. No respiratory distress.  Abdominal:     General: There is no distension.     Tenderness: There is no guarding.  Musculoskeletal:        General: No deformity or signs of injury.     Cervical back: Neck supple.  Skin:    Findings: No lesion or rash.  Neurological:     Mental Status: She is alert.     Comments: Aphasic, Left gaze preference, eye will not cross midline, right hemibody paresis     ED Results / Procedures / Treatments   Labs (all labs ordered are listed, but only abnormal results are displayed) Labs Reviewed  I-STAT CHEM 8, ED - Abnormal; Notable for the following components:      Result Value   Glucose, Bld 107 (*)    Calcium, Ion 1.12 (*)    Hemoglobin 11.2 (*)    HCT 33.0 (*)    All other components within normal limits  CBG MONITORING, ED - Abnormal; Notable for the following components:   Glucose-Capillary 110 (*)    All other components within normal limits    EKG EKG Interpretation Date/Time:  Monday June 30 2023 12:18:45 EST Ventricular Rate:  99 PR Interval:    QRS Duration:  100 QT Interval:  401 QTC Calculation: 515 R Axis:   -72  Text Interpretation: Atrial fibrillation Ventricular premature complex Left anterior fascicular block Abnormal R-wave progression, early transition Probable left ventricular hypertrophy Anterior Q waves, possibly due to LVH Prolonged QT interval Artifact in lead(s) I II III aVR aVL aVF V1 V2 V3 V4 V5  Confirmed by Ernie Avena ([id number]) on 06/30/2023 1:57:59 PM  Radiology CT HEAD CODE STROKE WO CONTRAST Result Date: 06/30/2023 CLINICAL DATA:  Code stroke.  89 year old female. EXAM: CT HEAD WITHOUT CONTRAST TECHNIQUE: Contiguous axial images were obtained from the base of the skull through the vertex without intravenous contrast. RADIATION DOSE REDUCTION: This exam was performed according to the departmental dose-optimization program which includes automated exposure control, adjustment of the mA and/or kV according to patient size and/or use of iterative reconstruction technique. COMPARISON:  Brain MRI 07/27/2015.  Head CT 06/18/2023. FINDINGS: Brain: No midline shift, mass effect, or evidence of intracranial mass lesion. No acute intracranial hemorrhage identified. No ventriculomegaly.  Heterogeneous bilateral cerebral white matter, bilateral deep gray nuclei. The appearance is that of advanced chronic small vessel disease. Bilateral thalamic involvement, right caudothalamic involvement, deep white matter capsule involvement appears stable from earlier this month. Superimposed small chronic left posterior and superior cerebellar infarcts also appears stable. Vascular: No suspicious intracranial vascular hyperdensity. Calcified atherosclerosis at the skull base. Skull: Stable.  No acute osseous abnormality identified. Sinuses/Orbits: Visualized paranasal sinuses and mastoids are stable and well aerated. Other: No gaze deviation. Visualized scalp soft tissues are within normal limits. ASPECTS [facility] Stroke Program Early CT Score) Total score (0-10 with 10 being normal): 10 IMPRESSION: 1. No acute cortically based infarct or intracranial hemorrhage identified. ASPECTS 10. 2. Stable non contrast CT appearance of advanced chronic small vessel ischemia. 3. These results were communicated to Dr. Selina Cooley at 11:47 am on 06/30/2023 by text page via the [facility] messaging system. Electronically Signed   By: Odessa Fleming M.D.   On:  06/30/2023 11:48    Procedures Procedures    Medications Ordered in ED Medications  sodium chloride flush (NS) 0.9 % injection 3 mL (3 mLs Intravenous Not Given 06/30/23 1210)  acetaminophen (TYLENOL) tablet 650 mg (has no administration in time range)    Or  acetaminophen (TYLENOL) suppository 650 mg (has no administration in time range)  ondansetron (ZOFRAN-ODT) disintegrating tablet 4 mg (has no administration in time range)    Or  ondansetron (ZOFRAN) injection 4 mg (has no administration in time range)  glycopyrrolate (ROBINUL) tablet 1 mg (has no administration in time range)    Or  glycopyrrolate (ROBINUL) injection 0.2 mg (has no administration in time range)    Or  glycopyrrolate (ROBINUL) injection 0.2 mg (has no administration in time range)  antiseptic oral rinse (BIOTENE) solution 15 mL (has no administration in time range)  polyvinyl alcohol (LIQUIFILM TEARS) 1.4 % ophthalmic solution 1 drop (has no administration in time range)  LORazepam (ATIVAN) tablet 1 mg (has no administration in time range)    Or  LORazepam (ATIVAN) 2 MG/ML concentrated solution 1 mg (has no administration in time range)    Or  LORazepam (ATIVAN) injection 1 mg (has no administration in time range)  morphine CONCENTRATE 10 mg / 0.5 ml oral solution 5 mg (has no administration in time range)    Or  morphine CONCENTRATE 10 mg / 0.5 ml oral solution 5 mg (has no administration in time range)    ED Course/ Medical Decision Making/ A&P                                 Medical Decision Making Amount and/or Complexity of Data Reviewed Labs: ordered. Radiology: ordered.  Risk Decision regarding hospitalization.    89 year old female presenting from skilled nursing facility, DNR in place with a report of last normal around 1030 with a chief complaint of nonverbal speech, drooling, right-sided hemibody weakness, left gaze preference.  Patient presented as code stroke, was evaluated at the bridge  and her airway was cleared, CBG 130 and she was brought to the CT imager for code stroke imaging.  CT Head: IMPRESSION:  1. No acute cortically based infarct or intracranial hemorrhage  identified. ASPECTS 10.  2. Stable non contrast CT appearance of advanced chronic small  vessel ischemia.  3. These results were communicated to Dr. Selina Cooley at 11:47 am on  06/30/2023 by text page via the [facility] messaging system.   Per neurology,  patient not eligible for TNK.  Patient with LVO by exam, likely left MCA or left carotid.  Not a candidate for intervention.  The patient's daughter confirmed the patient's DNR status with neurology bedside and she was subsequently transition for comfort care.  Hospitalist medicine consulted for admission for consideration for hospice in place.  Social work consulted for consideration for residential hospice placement.   Patient daughter is agreeable to [facility] referral for [employer]. Referral placed and social work coordinating, palliative care assisting. Placement coordinating per [facility] will take time, hospitalist accepted the patient in admission.   Final Clinical Impression(s) / ED Diagnoses Final diagnoses:  Cerebrovascular accident (CVA), unspecified mechanism [facility])    Rx / DC Orders ED Discharge Orders     None         Ernie Avena, MD 06/30/23 1513    Ernie Avena, MD 06/30/23 [phone]

## 2023-06-30 NOTE — ED Notes (Signed)
Phlebotomy tried to collect blood work.  Unsuccessful.  This RN tried to place IV.  Unsuccessful.  CT Tech tried to place IV.  Unsuccessful.  IV team consult to be placed.

## 2023-06-30 NOTE — Consult Note (Signed)
NEUROLOGY CONSULT NOTE   Date of service: June 30, 2023 Patient Name: [name] MRN:  [id number] DOB:  [birth year] Chief Complaint: acute ischemic stroke Requesting Provider: Clydie Braun, MD  History of Present Illness   This is a 89 year old woman with past medical history significant for A-fib not on anticoagulation, hypertension, cancer, protein calorie malnutrition. Stroke code was activated by EMS due to aphasia, right facial droop, and right hemiplegia. Last known well was initially reported to be 1030 however after I spoke with the daughter in the facility last known well was determined to be yesterday at 1600. NIH stroke scale was 25. She was disoriented, not following commands, left gaze deviation, right facial droop, right arm weakness, bilateral leg weakness, global aphasia/mute. CT head showed no acute process. She was not a candidate for thrombolysis due to presentation outside the window. She was not a candidate for IR due to MRI was 4-5. I expressed the situation to the daughter who agreed that patient would not want an invasive procedure or mechanical ventilation even if she were eligible for thrombectomy. Patient's daughter elected to proceed with comfort care measures only.   LKW: 1600 yesterday Modified rankin score: 4-5 IV Thrombolysis: no, outside the window EVT: no, see above  NIHSS components Score: Comment  1a Level of Conscious 0[]  1[x]  2[]  3[]      1b LOC Questions 0[]  1[]  2[x]       1c LOC Commands 0[]  1[]  2[x]       2 Best Gaze 0[]  1[]  2[x]       3 Visual 0[x]  1[]  2[]  3[]      4 Facial Palsy 0[]  1[]  2[x]  3[]      5a Motor Arm - left 0[]  1[x]  2[]  3[]  4[]  UN[]    5b Motor Arm - Right 0[]  1[]  2[]  3[x]  4[]  UN[]    6a Motor Leg - Left 0[]  1[]  2[]  3[x]  4[]  UN[]    6b Motor Leg - Right 0[]  1[]  2[]  3[x]  4[]  UN[]    7 Limb Ataxia 0[x]  1[]  2[]  3[]  UN[]     8 Sensory 0[]  1[x]  2[]  UN[]      9 Best Language 0[]  1[]  2[]  3[x]      10 Dysarthria 0[]  1[]  2[x]  UN[]      11  Extinct. and Inattention 0[x]  1[]  2[]       TOTAL:  25      ROS   UTA 2/2 aphasia  Past History   Past Medical History:  Diagnosis Date   Arthritis    Cancer (HCC) 2011, 2012   left breast cancer 2011 central excision,2012 mastectomy   Complication of anesthesia    shortness of breath after procedure   Glaucoma    Hypertension    Protein-calorie malnutrition, severe (HCC)    SVT (supraventricular tachycardia) (HCC)    Upper GI bleed 01/2013.    Melena. HPylori negative gastritis on EGD.     Past Surgical History:  Procedure Laterality Date   ABDOMINAL HYSTERECTOMY     BREAST SURGERY  2011   BREAST SURGERY  2012   lt breast masty   CHOLECYSTECTOMY     ESOPHAGOGASTRODUODENOSCOPY N/A 01/25/2013   Procedure: ESOPHAGOGASTRODUODENOSCOPY (EGD);  Surgeon: Beverley Fiedler, MD;  Location: [facility] ENDOSCOPY;  Service: Gastroenterology;  Laterality: N/A;   ESOPHAGOGASTRODUODENOSCOPY (EGD) WITH PROPOFOL  08/04/2014   Procedure: ESOPHAGOGASTRODUODENOSCOPY (EGD) WITH PROPOFOL;  Surgeon: Louis Meckel, MD;  Location: [facility] ENDOSCOPY;  Service: Endoscopy;;    Family History: Family History  Problem Relation Age of Onset   Coronary artery disease  Mother 23       MI    Social History  reports that she has never smoked. She has never used smokeless tobacco. She reports that she does not drink alcohol and does not use drugs.  Allergies  Allergen Reactions   Codeine Nausea And Vomiting    Medications   Current Facility-Administered Medications:    acetaminophen (TYLENOL) tablet 650 mg, 650 mg, Oral, Q6H PRN **OR** acetaminophen (TYLENOL) suppository 650 mg, 650 mg, Rectal, Q6H PRN, Ferolito, Albertina Senegal, NP   antiseptic oral rinse (BIOTENE) solution 15 mL, 15 mL, Topical, PRN, Ferolito, Albertina Senegal, NP   glycopyrrolate (ROBINUL) tablet 1 mg, 1 mg, Oral, Q4H PRN **OR** glycopyrrolate (ROBINUL) injection 0.2 mg, 0.2 mg, Subcutaneous, Q4H PRN **OR** glycopyrrolate (ROBINUL) injection 0.2 mg, 0.2 mg,  Intravenous, Q4H PRN, Ferolito, Albertina Senegal, NP   LORazepam (ATIVAN) tablet 1 mg, 1 mg, Oral, Q4H PRN **OR** LORazepam (ATIVAN) 2 MG/ML concentrated solution 1 mg, 1 mg, Sublingual, Q4H PRN **OR** LORazepam (ATIVAN) injection 1 mg, 1 mg, Intravenous, Q4H PRN, Ferolito, Albertina Senegal, NP   morphine CONCENTRATE 10 mg / 0.5 ml oral solution 5 mg, 5 mg, Oral, Q2H PRN **OR** morphine CONCENTRATE 10 mg / 0.5 ml oral solution 5 mg, 5 mg, Sublingual, Q2H PRN, Ferolito, Albertina Senegal, NP   ondansetron (ZOFRAN-ODT) disintegrating tablet 4 mg, 4 mg, Oral, Q6H PRN **OR** ondansetron (ZOFRAN) injection 4 mg, 4 mg, Intravenous, Q6H PRN, Ferolito, Albertina Senegal, NP   polyvinyl alcohol (LIQUIFILM TEARS) 1.4 % ophthalmic solution 1 drop, 1 drop, Both Eyes, QID PRN, Ferolito, Albertina Senegal, NP   sodium chloride flush (NS) 0.9 % injection 3 mL, 3 mL, Intravenous, Once, Ernie Avena, MD  Current Outpatient Medications:    acetaminophen (TYLENOL) 500 MG tablet, Take 2 tablets (1,000 mg total) by mouth every 6 (six) hours as needed for moderate pain (for pain). (Patient taking differently: Take 1,000 mg by mouth as needed for moderate pain (pain score 4-6) (for pain).), Disp: 30 tablet, Rfl: 0   amLODipine (NORVASC) 10 MG tablet, TAKE 1 TABLET(10 MG) BY MOUTH DAILY, Disp: 90 tablet, Rfl: 3   Ascorbic Acid (VITAMIN C) 1000 MG tablet, Take 1,000 mg by mouth at bedtime., Disp: , Rfl:    cyanocobalamin (VITAMIN B12) 1000 MCG tablet, Take 1 tablet (1,000 mcg total) by mouth daily. (Patient not taking: Reported on 06/18/2023), Disp: 30 tablet, Rfl: 0   cyanocobalamin (VITAMIN B12) 1000 MCG/ML injection, Inject 1,000 mcg into the muscle every 7 (seven) days., Disp: , Rfl:    diclofenac sodium (VOLTAREN) 1 % GEL, Apply 2 g topically See admin instructions. Apply 2 grams to both knees for pain four times a day (Patient taking differently: Apply 1 Application topically 4 (four) times daily.), Disp: , Rfl:    feeding supplement (ENSURE ENLIVE /  ENSURE PLUS) LIQD, Take 237 mLs by mouth 2 (two) times daily between meals., Disp: , Rfl:    ferrous sulfate (FEROSUL) 325 (65 FE) MG tablet, Take 325 mg by mouth at bedtime., Disp: , Rfl:    latanoprost (XALATAN) 0.005 % ophthalmic solution, Place 1 drop into both eyes at bedtime., Disp: , Rfl:    loperamide (IMODIUM) 2 MG capsule, Take 2 capsules (4 mg total) by mouth 3 (three) times daily as needed for diarrhea or loose stools., Disp: , Rfl:    metoprolol tartrate (LOPRESSOR) 50 MG tablet, Take 1 tablet (50 mg total) by mouth 2 (two) times daily., Disp: 180 tablet, Rfl: 3  OLANZapine (ZYPREXA) 2.5 MG tablet, Take 1 tablet (2.5 mg total) by mouth at bedtime., Disp: , Rfl:    pantoprazole sodium (PROTONIX) 40 mg, Take 40 mg by mouth daily., Disp: , Rfl:    polyethylene glycol (MIRALAX / GLYCOLAX) 17 g packet, Take 17 g by mouth as needed for mild constipation or moderate constipation., Disp: , Rfl:    polyethylene glycol (MIRALAX) 17 g packet, Take 17 g by mouth daily. (Patient not taking: Reported on 06/18/2023), Disp: 14 each, Rfl: 0   saccharomyces boulardii (FLORASTOR) 250 MG capsule, Take 1 capsule (250 mg total) by mouth 2 (two) times daily., Disp: , Rfl:    thiamine (VITAMIN B1) 100 MG tablet, Take 1 tablet (100 mg total) by mouth daily., Disp: 30 tablet, Rfl: 0   triamcinolone cream (KENALOG) 0.1 %, Apply 1 Application topically 2 (two) times daily. (Patient not taking: Reported on 06/18/2023), Disp: 100 g, Rfl: 11   Vitamin D, Ergocalciferol, (DRISDOL) 1.25 MG (50000 UNIT) CAPS capsule, Take 1 capsule (50,000 Units total) by mouth every 7 (seven) days., Disp: 5 capsule, Rfl: 0  Vitals   Vitals:   07/09/2023 1226 07-09-2023 1230 07/09/2023 1330 Jul 09, 2023 1415  BP: 137/84 (!) 158/100 (!) 154/74 (!) 152/73  Pulse: 100 (!) 44 81 87  Resp:  19 (!) 21 20  Temp:      SpO2:  98% 100% 99%  Weight:        Body mass index is 24.2 kg/m.  Physical Exam   Constitutional: Appears well-developed and  well-nourished.  Cardiovascular: Normal rate and regular rhythm.  Respiratory: Somewhat labored breathing  Neurologic Examination   MS: lethargic, unable to answer orientation questions, does not follow commands Speech: global aphasia, mute CN: PERRL, no blink to threat, L gaze deviation, R facial droop Motor: drift but not to bed LUE, no movement against gravity in any other extremity Sensory: decreased withdrawal to noxious stimuli on L Reflexes: 2+ symm with toes down bilat Coordination: UTA Gait: deferred  Labs/Imaging/Neurodiagnostic studies   CBC:  Recent Labs  Lab 09-Jul-2023 1348  HGB 11.2*  HCT 33.0*   Basic Metabolic Panel:  Lab Results  Component Value Date   NA 141 July 09, 2023   K 3.5 07-09-2023   CO2 23 06/22/2023   GLUCOSE 107 (H) Jul 09, 2023   BUN 17 09-Jul-2023   CREATININE 0.80 2023/07/09   CALCIUM 8.6 (L) 06/22/2023   GFRNONAA >60 06/22/2023   GFRAA 57 (L) 01/06/2019   Lipid Panel:  Lab Results  Component Value Date   LDLCALC 126 (H) 08/26/2022   HgbA1c: No results found for: "HGBA1C" Urine Drug Screen: No results found for: "LABOPIA", "COCAINSCRNUR", "LABBENZ", "AMPHETMU", "THCU", "LABBARB"  Alcohol Level No results found for: "ETH" INR  Lab Results  Component Value Date   INR 1.23 08/05/2014   APTT  Lab Results  Component Value Date   APTT 28 08/05/2014   AED levels: No results found for: "PHENYTOIN", "ZONISAMIDE", "LAMOTRIGINE", "LEVETIRACETA"  CT Head without contrast(Personally reviewed): 1. No acute cortically based infarct or intracranial hemorrhage identified. ASPECTS 10. 2. Stable non contrast CT appearance of advanced chronic small vessel ischemia.  ASSESSMENT   89 yo woman with hx a fib not on AC p/w global aphasia, mute, R hemiparesis. LKW originally reported as 1030 today but confirmed to be actually 1600 yesterday therefore not eligible for TNK. Patient with LVO by exam, likely either L carotid or L MCA. Not a candidate for  intervention 2/2 premorbid mRS = 4-5. Daughter  Ann confirmed that patient is DNAR and would not want any type of procedure regardless. CTA not performed bc would not change mgmt. Daughter [name] wishes to proceed with comfort care measures only.   RECOMMENDATIONS   - Comfort care measures only ______________________________________________________________________    Signed, Jefferson Fuel, MD [facility]

## 2023-06-30 NOTE — Progress Notes (Signed)
Neurology GOC discussion  89 yo woman with hx a fib not on AC p/w global aphasia, mute, R hemiparesis. LKW originally reported as 1030 today but confirmed to be actually 1600 yesterday therefore not eligible for TNK. Patient with LVO by exam, likely either L carotid or L MCA. Not a candidate for intervention 2/2 premorbid mRS = 4-5. Daughter [name] confirmed that patient is DNAR and would not want any type of procedure regardless. CTA not performed bc would not change mgmt. Daughter [name] wishes to proceed with comfort care measures only.  Full consult note to follow.  Bing Neighbors, MD [facility] [phone]  If 7pm- 7am, please page neurology on call as listed in AMION.

## 2023-06-30 NOTE — Code Documentation (Signed)
Stroke Response Nurse Documentation Code Documentation  [name] is a 89 y.o. female arriving to [facility]  via [facility] on 06/30/2023 with past medical hx of hypertension, cancer, SVT, and protein calorie malnutrition. On No antithrombotic. Code stroke was activated by EMS.   Patient from nursing facility where she was initially believed to be LKW at 1030. Dr. Selina Cooley able to determine LKW was yesterday 1600.Today noted to be aphasic and drooling with right facial droop and right arm weakness.    Stroke team at the bedside on patient arrival. Labs drawn and patient cleared for CT by EDP. Patient to CT with team. NIHSS 25, see documentation for details and code stroke times. Patient with disoriented, not following commands, left gaze preference , right hemianopia, right facial droop, right arm weakness, bilateral leg weakness, bilateral decreased sensation, Global aphasia , dysarthria , and Visual  neglect on exam. The following imaging was completed:  CT Head. Unable to obtain IV access for CTA after multiple attempts including use of ultrasound. Patient is not a candidate for IV Thrombolytic due to out of the window. Patient is not a candidate for IR due to mRS 4-5.   Care Plan: daughter expressed desire for comfort care when she spoke with Dr. Selina Cooley.    Ferman Hamming  Stroke Response RN

## 2023-06-30 NOTE — ED Provider Notes (Signed)
  Physical Exam  BP (!) 152/73   Pulse 87   Temp (!) 97.1 F (36.2 C)   Resp 20   Wt 68 kg   SpO2 99%   BMI 24.20 kg/m   Physical Exam  Procedures  Procedures  ED Course / MDM    Medical Decision Making Amount and/or Complexity of Data Reviewed Radiology: ordered.  Risk Decision regarding hospitalization.   89 yo female with lvo, dnr, plan to go to [facility] hospice. Daughter is poa and aware of plan

## 2023-06-30 NOTE — H&P (Signed)
History and Physical    Patient: [name] YWV:[id number] DOB: 04-20-1922 DOA: 06/30/2023 DOS: the patient was seen and examined on 06/30/2023 PCP: Eloisa Northern, MD  Patient coming from: SNF Via EMS Chief Complaint: Right-sided weakness.  HPI: [name] is a 89 y.o. female with medical history significant of hypertension, atrial fibrillation not on anticoagulation, paroxysmal SVT, history of breast cancer, and dementia who presents after having been noted to become nonverbal with drooling the right side of her mouth, right-sided weakness, and left-sided gaze preference.  Last reported to be normal at around 10:30 AM this morning.  Records note patient had just recently been hospitalized from 1/15-1/20 for dysphagia with concerns for aspiration pneumonia.  At discharge she was transferred to rehab where she had been until her presentation today.  Patient was brought to the emergency department as a code stroke.  CT scan of the head did not note any acute abnormality.  She was not a candidate for thrombolytics due to being outside of the window.  She was not thought to be a candidate for intervention secondary to the morbidities with mRS We will do 4-5.   Patient was noted to be afebrile with pulse 44-100, blood pressures 137/84 to 158/100, and O2 saturation maintained on room air.  Labs noted hemoglobin 11.2, glucose 107, and ionized calcium 1.12.  Family confirmed that the patient would not have wanted invasive intervention and patient was transitioned to comfort care. Palliative care have been consulted for consideration for residential hospice.  However, [facility] without bed availability until tomorrow.  Review of Systems: unable to review all systems due to the inability of the patient to answer questions. Past Medical History:  Diagnosis Date   Arthritis    Cancer (HCC) 2011, 2012   left breast cancer 2011 central excision,2012 mastectomy   Complication of anesthesia     shortness of breath after procedure   Glaucoma    Hypertension    Protein-calorie malnutrition, severe (HCC)    SVT (supraventricular tachycardia) (HCC)    Upper GI bleed 01/2013.    Melena. HPylori negative gastritis on EGD.    Past Surgical History:  Procedure Laterality Date   ABDOMINAL HYSTERECTOMY     BREAST SURGERY  2011   BREAST SURGERY  2012   lt breast masty   CHOLECYSTECTOMY     ESOPHAGOGASTRODUODENOSCOPY N/A 01/25/2013   Procedure: ESOPHAGOGASTRODUODENOSCOPY (EGD);  Surgeon: Beverley Fiedler, MD;  Location: [facility] ENDOSCOPY;  Service: Gastroenterology;  Laterality: N/A;   ESOPHAGOGASTRODUODENOSCOPY (EGD) WITH PROPOFOL  08/04/2014   Procedure: ESOPHAGOGASTRODUODENOSCOPY (EGD) WITH PROPOFOL;  Surgeon: Louis Meckel, MD;  Location: [facility] ENDOSCOPY;  Service: Endoscopy;;   Social History:  reports that she has never smoked. She has never used smokeless tobacco. She reports that she does not drink alcohol and does not use drugs.  Allergies  Allergen Reactions   Codeine Nausea And Vomiting    Family History  Problem Relation Age of Onset   Coronary artery disease Mother 6       MI    Prior to Admission medications   Medication Sig Start Date End Date Taking? Authorizing Provider  acetaminophen (TYLENOL) 500 MG tablet Take 2 tablets (1,000 mg total) by mouth every 6 (six) hours as needed for moderate pain (for pain). Patient taking differently: Take 1,000 mg by mouth as needed for moderate pain (pain score 4-6) (for pain). 01/03/21   [name], PA-C  amLODipine (NORVASC) 10 MG tablet TAKE 1 TABLET(10 MG) BY  MOUTH DAILY 10/29/22   [name], MD  Ascorbic Acid (VITAMIN C) 1000 MG tablet Take 1,000 mg by mouth at bedtime.    [provider]  cyanocobalamin (VITAMIN B12) 1000 MCG tablet Take 1 tablet (1,000 mcg total) by mouth daily. Patient not taking: Reported on 06/18/2023 04/04/23   [name], [name], MD  cyanocobalamin (VITAMIN B12) 1000 MCG/ML injection Inject  1,000 mcg into the muscle every 7 (seven) days.    [provider]  diclofenac sodium (VOLTAREN) 1 % GEL Apply 2 g topically See admin instructions. Apply 2 grams to both knees for pain four times a day Patient taking differently: Apply 1 Application topically 4 (four) times daily. 10/31/17   [name], MD  feeding supplement (ENSURE ENLIVE / ENSURE PLUS) LIQD Take 237 mLs by mouth 2 (two) times daily between meals. 06/23/23   [name], MD  ferrous sulfate (FEROSUL) 325 (65 FE) MG tablet Take 325 mg by mouth at bedtime.    [provider]  latanoprost (XALATAN) 0.005 % ophthalmic solution Place 1 drop into both eyes at bedtime. 06/17/18   [provider]  loperamide (IMODIUM) 2 MG capsule Take 2 capsules (4 mg total) by mouth 3 (three) times daily as needed for diarrhea or loose stools. 06/23/23   [name], MD  metoprolol tartrate (LOPRESSOR) 50 MG tablet Take 1 tablet (50 mg total) by mouth 2 (two) times daily. 08/26/22   [name], MD  OLANZapine (ZYPREXA) 2.5 MG tablet Take 1 tablet (2.5 mg total) by mouth at bedtime. 04/02/23   [name], MD  pantoprazole sodium (PROTONIX) 40 mg Take 40 mg by mouth daily.    [provider]  polyethylene glycol (MIRALAX / GLYCOLAX) 17 g packet Take 17 g by mouth as needed for mild constipation or moderate constipation.    [provider]  polyethylene glycol (MIRALAX) 17 g packet Take 17 g by mouth daily. Patient not taking: Reported on 06/18/2023 04/04/23   [name], [name], MD  saccharomyces boulardii (FLORASTOR) 250 MG capsule Take 1 capsule (250 mg total) by mouth 2 (two) times daily. 06/23/23   [name], MD  thiamine (VITAMIN B1) 100 MG tablet Take 1 tablet (100 mg total) by mouth daily. 04/04/23   [name], [name], MD  triamcinolone cream (KENALOG) 0.1 % Apply 1 Application topically 2 (two) times daily. Patient not taking: Reported on 06/18/2023 08/26/22   [name], MD  Vitamin D, Ergocalciferol, (DRISDOL) 1.25 MG (50000 UNIT) CAPS capsule Take 1 capsule (50,000 Units total) by mouth every 7 (seven) days. 04/03/23   Alba Cory, MD    Physical Exam: Vitals:   06/30/23 1226 06/30/23 1230 06/30/23 1330 06/30/23 1415  BP: 137/84 (!) 158/100 (!) 154/74 (!) 152/73  Pulse: 100 (!) 44 81 87  Resp:  19 (!) 21 20  Temp:      SpO2:  98% 100% 99%  Weight:        Constitutional: Elderly female is not able to answer questions or follow commands on Eyes: Left gaze preference.  Lids and conjunctivae normal ENMT: Mucous membranes are moist.  right-sided facial droop. Neck: normal, supple  Respiratory: clear to auscultation bilaterally, no wheezing, no crackles. Normal respiratory effort. No accessory muscle use.  Cardiovascular: Regular rate and rhythm, no murmurs / rubs / gallops. No extremity edema. 2+ pedal pulses. No carotid bruits.  Abdomen: no tenderness, no masses palpated. No hepatosplenomegaly. Bowel sounds positive.  Musculoskeletal: no  clubbing / cyanosis. No joint deformity upper and lower extremities.   Skin: no rashes, lesions, ulcers. No induration Neurologic: CN 2-12 grossly intact.  Patient withdrawals to noxious stimuli on the left.  No movement on the right upper extremity appears to have contracture of the hand. Psychiatric: Normal judgment and insight. Alert and oriented x 3. Normal mood.   Data Reviewed:  Reviewed labs, imaging, and pertinent records.    Assessment and Plan:  CVA Paroxysmal atrial fibrillation Hypertension Dementia DNR Patient presented after being noted to be aphasic with right-sided facial droop and right-sided weakness.  CT scan did not note any acute abnormality.  She is not a candidate for thrombolytics due to being outside the window.  Neurology suspected large vessel occlusion by exam likely in left MCA or left carotid artery.  Patient was not found to be a candidate for IR.  After further  discussions with family, CODE STATUS was confirmed as DNR and it was noted that she would not have wanted any invasive procedures even if she was eligible for thrombectomy.  She was transitioned to comfort care measures only.   Hospice has been consulted and patient awaiting bed at [facility]. - Admit to a palliative bed - Palliative care order set initiated - Discontinue cardiac monitoring - Routine vital sign checks - Discontinued home medications - N.p.o. - Okay for RN to pronounce death - Aspiration precautions - Maintain IV access - Morphine prn pain - Zofran IV prn nausea/vomiting - Ativan IV prn anxiety - Haloperidol IV prn agitation or delirium - Glycopyrrolate prn excessive secretions - Albuterol prn wheezing   - Appreciate palliative care consult   Advance Care Planning:   Code Status: Do not attempt resuscitation (DNR) - Comfort care    Consults: Palliative care  Family Communication: Family updated at bedside  Severity of Illness: The appropriate patient status for this patient is OBSERVATION. Observation status is judged to be reasonable and necessary in order to provide the required intensity of service to ensure the patient's safety. The patient's presenting symptoms, physical exam findings, and initial radiographic and laboratory data in the context of their medical condition is felt to place them at decreased risk for further clinical deterioration. Furthermore, it is anticipated that the patient will be medically stable for discharge from the hospital within 2 midnights of admission.   Author: Clydie Braun, MD 06/30/2023 3:48 PM  For on call review [email or web address].

## 2023-06-30 NOTE — ED Triage Notes (Signed)
Pt is a 89 year old female from a sniff, dnr with paper work, report last known normal from ems was 1030.  Pt found by staff to have non verbal, drooling,right sided weakness, and right side weakness and limpness that evolved into contracted like presentation.  Cbg 130 from ems.  Hr 80-110 100% spo2 on rome air  158/98  Hx of afib with no blood thinners.

## 2023-06-30 NOTE — ED Notes (Signed)
IV team at bedside

## 2023-06-30 NOTE — Consult Note (Signed)
Palliative Medicine Inpatient Consult Note  Consulting Provider: Dr. Katrinka Blazing  Reason for consult:   Palliative Care Consult Services Palliative Medicine Consult  Reason for Consult? Stroke comfort care measures only.  Question transferring possibly to [facility] for inpatient hospice   06/30/2023  HPI:  Per intake H&P --> [name] is a 89 y.o. female with medical history significant for atrial fibrillation not anticoagulated, paroxysmal SVT, hypertension, and history of hospital delirium who presents with difficulty swallowing. Palliative care asked to be involved to help with end of life care and assess for IP hospice appropriateness.   Clinical Assessment/Goals of Care:  *Please note that this is a verbal dictation therefore any spelling or grammatical errors are due to the "[employer]agon [employer] One" system interpretation.  I have reviewed medical records including EPIC notes, labs and imaging, received report from bedside RN, assessed the patient.    I met with patients daughter, [name] to further discuss diagnosis prognosis, GOC, EOL wishes, disposition and options.   I introduced Palliative Medicine as specialized medical care for people living with serious illness. It focuses on providing relief from the symptoms and stress of a serious illness. The goal is to improve quality of life for both the patient and the family.  Medical History Review and Understanding:  A review of patient's past medical history inclusive of A-fib, paroxysmal SVT, hypertension, and cognitive impairment was held.  Social History:  Lashundra lives in [place].  She has been a widow for roughly 30 years though had been married for 50 years prior to this.  She has 3 sons and 1 daughter.  She has multiple grandchildren, great-grandchildren, and great great grandchildren.  She formally worked as a housewife.  She is a woman of strong Catholic faith.  Functional and Nutritional  State:  Preceding hospitalization, [name] was requiring help with B ADLs from her daughter and.  Her appetite was fair though diminishing.  Advance Directives:  A detailed discussion was had today regarding advanced directives.  Patient has no advanced directives on file though her surrogate decision maker(s) are her children.  Code Status:  Concepts specific to code status, artifical feeding and hydration, continued IV antibiotics and rehospitalization was had.  The difference between a aggressive medical intervention path  and a palliative comfort care path for this patient at this time was had.   And is an established DO NOT RESUSCITATE DO NOT INTUBATE CODE STATUS.  Discussion:  A review of patient's acute on chronic disease conditions was held.  We discussed patient's newly identified stroke and how there would be no interventions based upon patient's age and conditions.  Per daughter and she had spoken to the ER physician and determined the goals of allowing [name] to be comfortable.  We talked about transition to comfort measures in house and what that would entail inclusive of medications to control pain, dyspnea, agitation, nausea, itching, and hiccups.  We discussed stopping all uneccessary measures such as cardiac monitoring, blood draws, needle sticks, and frequent vital signs. Utilized reflective listening throughout our time together.   Patient's daughter is agreeable to having [facility] evaluate her for potential [name] inpatient hosp[facility] home discharge.  Discussed the importance of continued conversation with family and their  medical providers regarding overall plan of care and treatment options, ensuring decisions are within the context of the patients values and GOCs.  Decision Maker: Barbara, [name] (Daughter): [phone] (Home Phone)   SUMMARY OF RECOMMENDATIONS   DNAR/DNI  Comfort Measures in  the setting of stroke  Medications per [name]  Appreciate  [facility] evaluation for [facility]  Ongoing PMT support  Code Status/Advance Care Planning: DNAR/DNI  Palliative Prophylaxis:  Aspiration, Bowel Regimen, Delirium Protocol, Frequent Pain Assessment, Oral Care, Palliative Wound Care, and Turn Reposition  Additional Recommendations (Limitations, Scope, Preferences): Continue current care -comfort care  Psycho-social/Spiritual:  Desire for further Chaplaincy support: Yes - Catholic Additional Recommendations: Education on EOL    Prognosis: Limited to days  Discharge Planning: Discharge to inpatient Hospice Home  Vitals:   06/30/23 1230 06/30/23 1330  BP: (!) 158/100 (!) 154/74  Pulse: (!) 44 81  Resp: 19 (!) 21  Temp:    SpO2: 98% 100%   No intake or output data in the 24 hours ending 06/30/23 1411 Last Weight  Most recent update: 06/30/2023 11:30 AM    Weight  68 kg (149 lb 14.6 oz)            Gen:  Elderly AA F chronically ill appearing HEENT: Dry mucous membranes CV: Regular rate and irregular rhythm  PULM:  On RA, breathing even and nonlabored ABD: soft/nontender  EXT: No edema  Neuro: Eyes closed minimal response  PPS: 10%   This conversation/these recommendations were discussed with patient primary care team, Dr. Karene [name]  Billing based on MDM: High ______________________________________________________ Lamarr Lulas [place] Palliative Medicine Team Team Cell Phone: [phone] Please utilize secure chat with additional questions, if there is no response within 30 minutes please call the above phone number  Palliative Medicine Team providers are available by phone from 7am to 7pm daily and can be reached through the team cell phone.  Should this patient require assistance outside of these hours, please call the patient's attending physician.

## 2023-07-01 DIAGNOSIS — I63312 Cerebral infarction due to thrombosis of left middle cerebral artery: Secondary | ICD-10-CM | POA: Diagnosis not present

## 2023-07-01 DIAGNOSIS — R4701 Aphasia: Secondary | ICD-10-CM | POA: Diagnosis present

## 2023-07-01 DIAGNOSIS — Z8249 Family history of ischemic heart disease and other diseases of the circulatory system: Secondary | ICD-10-CM | POA: Diagnosis not present

## 2023-07-01 DIAGNOSIS — N1831 Chronic kidney disease, stage 3a: Secondary | ICD-10-CM | POA: Diagnosis present

## 2023-07-01 DIAGNOSIS — Z885 Allergy status to narcotic agent status: Secondary | ICD-10-CM | POA: Diagnosis not present

## 2023-07-01 DIAGNOSIS — R1310 Dysphagia, unspecified: Secondary | ICD-10-CM | POA: Diagnosis present

## 2023-07-01 DIAGNOSIS — Z66 Do not resuscitate: Secondary | ICD-10-CM | POA: Diagnosis not present

## 2023-07-01 DIAGNOSIS — Z789 Other specified health status: Secondary | ICD-10-CM | POA: Diagnosis not present

## 2023-07-01 DIAGNOSIS — Z515 Encounter for palliative care: Secondary | ICD-10-CM | POA: Diagnosis not present

## 2023-07-01 DIAGNOSIS — F0390 Unspecified dementia without behavioral disturbance: Secondary | ICD-10-CM | POA: Diagnosis present

## 2023-07-01 DIAGNOSIS — I6350 Cerebral infarction due to unspecified occlusion or stenosis of unspecified cerebral artery: Secondary | ICD-10-CM | POA: Diagnosis present

## 2023-07-01 DIAGNOSIS — G8191 Hemiplegia, unspecified affecting right dominant side: Secondary | ICD-10-CM | POA: Diagnosis present

## 2023-07-01 DIAGNOSIS — I4719 Other supraventricular tachycardia: Secondary | ICD-10-CM | POA: Diagnosis not present

## 2023-07-01 DIAGNOSIS — I482 Chronic atrial fibrillation, unspecified: Secondary | ICD-10-CM | POA: Diagnosis not present

## 2023-07-01 DIAGNOSIS — R29810 Facial weakness: Secondary | ICD-10-CM | POA: Diagnosis present

## 2023-07-01 DIAGNOSIS — I639 Cerebral infarction, unspecified: Secondary | ICD-10-CM | POA: Diagnosis not present

## 2023-07-01 DIAGNOSIS — Z79899 Other long term (current) drug therapy: Secondary | ICD-10-CM | POA: Diagnosis not present

## 2023-07-01 DIAGNOSIS — R29726 NIHSS score 26: Secondary | ICD-10-CM | POA: Diagnosis present

## 2023-07-01 DIAGNOSIS — H409 Unspecified glaucoma: Secondary | ICD-10-CM | POA: Diagnosis present

## 2023-07-01 DIAGNOSIS — I10 Essential (primary) hypertension: Secondary | ICD-10-CM | POA: Diagnosis present

## 2023-07-01 DIAGNOSIS — I480 Paroxysmal atrial fibrillation: Secondary | ICD-10-CM | POA: Diagnosis present

## 2023-07-01 DIAGNOSIS — R627 Adult failure to thrive: Secondary | ICD-10-CM | POA: Diagnosis not present

## 2023-07-01 DIAGNOSIS — Z853 Personal history of malignant neoplasm of breast: Secondary | ICD-10-CM | POA: Diagnosis not present

## 2023-07-01 NOTE — Progress Notes (Signed)
PROGRESS NOTE  [name]  DOB: Oct 09, 1921  PCP: Eloisa Northern, MD QMV:[id number]  DOA: 06/30/2023  LOS: 0 days  Hospital Day: 2  Brief narrative: [name] is a 89 y.o. female with PMH significant for hypertension, atrial fibrillation not on anticoagulation, paroxysmal SVT, history of breast cancer, and dementia  Recently hospitalized from 1/15-1/20 for dysphagia with concerns for aspiration pneumonia, ultimately discharged to SNF at [facility]. 1/27, patient was brought to the ED from SNF after he was noted by the staff to be nonverbal n with drooling the right side of her mouth, right-sided weakness, and left-sided gaze preference.  He was initially brought in and had code stroke. CT scan of the head did not note any acute abnormality.   She was not a candidate for thrombolytics due to being outside of the window.   She was not thought to be a candidate for intervention secondary to the morbidities.   Patient was noted to be afebrile with pulse 44-100, blood pressures 137/84 to 158/100, and O2 saturation maintained on room air.   Labs noted hemoglobin 11.2, glucose 107, and ionized calcium 1.12.   Family confirmed that the patient would not have wanted invasive intervention and patient was transitioned to comfort care.  Palliative care was consulted for consideration for residential hospice.   However, [facility] without bed availability until tomorrow. Kept observation overnight to TR[facility]   Subjective: Patient was seen and examined this morning. Elderly African-American female.  Propped up in bed.  Mumbling.  Not in pain.  Family at bedside. Hemodynamically stable overnight  Assessment and plan: Comfort care status Initially brought in from SNF for altered mental status, suspected stroke Initial workup negative for stroke. Family expressed patient's wish to close comfort care  Palliative care was consulted.   Comfort measures stated  Comfort care order set  active Discontinued primary Pending residential hospice  Other acute medical issues CVA Paroxysmal atrial fibrillation Hypertension Dementia DNR    Goals of care   Code Status: Do not attempt resuscitation (DNR) - Comfort care   Consultants: Palliative care Family Communication: Family at bedside  Status: Inpatient Level of care:  Palliative Care   Patient is from: Home Needs to continue in-hospital care: Pending residential hospice bed     Diet:  Diet Order             Diet NPO time specified  Diet effective now                   Scheduled Meds:  sodium chloride flush  3 mL Intravenous Once    PRN meds: acetaminophen **OR** acetaminophen, antiseptic oral rinse, glycopyrrolate **OR** glycopyrrolate **OR** glycopyrrolate, haloperidol **OR** haloperidol **OR** haloperidol lactate, LORazepam **OR** LORazepam **OR** LORazepam, morphine CONCENTRATE **OR** morphine CONCENTRATE, ondansetron **OR** ondansetron (ZOFRAN) IV, polyvinyl alcohol   Infusions:    Antimicrobials: Anti-infectives (From admission, onward)    None       Objective: Vitals:   06/30/23 2100 06/30/23 2130  BP: (!) 165/88 (!) 163/84  Pulse: 68 85  Resp: (!) 24 20  Temp: 98 F (36.7 C) 99.3 F (37.4 C)  SpO2: 100% 95%   No intake or output data in the 24 hours ending 07/01/23 1330 Filed Weights   06/30/23 0140 06/30/23 1100 06/30/23 2130  Weight: 68 kg 68 kg 68 kg   Weight change: 0 kg Body mass index is 24.2 kg/m.   Physical Exam: General exam: Pleasant, elderly African-American female.  Not in  distress. I did not do a detailed examination because of comfort care status  Data Review: I have personally reviewed the laboratory data and studies available.  F/u labs  Unresulted Labs (From admission, onward)    None       Admission date and time: 06/30/2023 11:25 AM   Total time spent in review of labs and imaging, patient evaluation, formulation of plan, documentation  and communication with family: 45 minutes  Signed, Lorin Glass, MD [facility] 07/01/2023

## 2023-07-01 NOTE — Progress Notes (Signed)
Pulled ativan tablet 1 mg at 0923 from pyxis A in error for another pt in 6n32 who asked for ativan tablet 0.5 mg per MD order. Pt in 6n32 received accurate dose. Remaining 0.5 mg will be wasted per protocol. Pulled ativan 1 mg injection at 1145 for this pt and I realized the previous ativan had been pulled under Ms. Kirchner's name as well. Per call to main pharmacy spoke with narcotic tech, Luvenia Heller, she advised that I make a progress note in both pt's chart with the details of the error for pulling both doses of ativan under this Ms. Banes's name but both pt's received their accurate dose per MD's orders.

## 2023-07-01 NOTE — Plan of Care (Signed)
  Problem: Clinical Measurements: Goal: Quality of life will improve Outcome: Progressing   Problem: Pain Management: Goal: Satisfaction with pain management regimen will improve Outcome: Progressing   Problem: Coping: Goal: Level of anxiety will decrease Outcome: Progressing

## 2023-07-01 NOTE — Progress Notes (Signed)
Family at bedside.pt sleeping. Family said they didn't need anything.

## 2023-07-01 NOTE — Plan of Care (Signed)
Problem: Pain Management: Goal: Satisfaction with pain management regimen will improve Outcome: Progressing   Problem: Safety: Goal: Ability to remain free from injury will improve Outcome: Progressing   Problem: Skin Integrity: Goal: Risk for impaired skin integrity will decrease Outcome: Progressing

## 2023-07-01 NOTE — Progress Notes (Signed)
[facility] Liaison Note   Received request from PMT for family interest in [facility]. Contacted daughter and discussed hospice philosophy and inpatient hospice care. Patient is approved for [facility] inpatient hospice care by Dr. Patric Dykes, hospice provider.   Unfortunately, [facility] is not able to offer a bed today. Family and [facility] manager aware.   Hospital liaison will follow up tomorrow or sooner if a room becomes available.   Please do not hesitate to call with questions.   Glenna Fellows, BSN, RN, [facility] Liaison [phone]

## 2023-07-01 NOTE — Progress Notes (Addendum)
Daily Progress Note   Patient Name: SHAJUAN MUSSO       Date: 07/01/2023 DOB: 12/30/1921  Age: 89 y.o. MRN#: [id number] Attending Physician: Lorin Glass, MD Primary Care Physician: Eloisa Northern, MD Admit Date: 06/30/2023  Reason for Consultation/Follow-up: Inpatient hospice referral  Subjective: Pt resting in bed. She acknowledges my presence. When asked about pain, she shakes head to signify yes, but is unable to point to area of pain.   Length of Stay: 0  Current Medications: Scheduled Meds:   sodium chloride flush  3 mL Intravenous Once     PRN Meds: acetaminophen **OR** acetaminophen, antiseptic oral rinse, glycopyrrolate **OR** glycopyrrolate **OR** glycopyrrolate, haloperidol **OR** haloperidol **OR** haloperidol lactate, LORazepam **OR** LORazepam **OR** LORazepam, morphine CONCENTRATE **OR** morphine CONCENTRATE, ondansetron **OR** ondansetron (ZOFRAN) IV, polyvinyl alcohol  Physical Exam Vitals reviewed.  Constitutional:      General: She is not in acute distress.    Appearance: She is ill-appearing.  HENT:     Mouth/Throat:     Mouth: Mucous membranes are dry.  Pulmonary:     Effort: Pulmonary effort is normal. No respiratory distress.  Musculoskeletal:     Right lower leg: No edema.     Left lower leg: No edema.  Skin:    General: Skin is warm and dry.  Neurological:     Mental Status: She is alert.             Vital Signs: BP (!) 163/84 (BP Location: Right Arm)   Pulse 85   Temp 99.3 F (37.4 C)   Resp 20   Ht 5\' 6"  (1.676 m)   Wt 68 kg   SpO2 95%   BMI 24.20 kg/m  SpO2: SpO2: 95 % O2 Device: O2 Device: Room Air O2 Flow Rate:    Intake/output summary: No intake or output data in the 24 hours ending 07/01/23 1216 LBM:   Baseline Weight: Weight: 68 kg Most  recent weight: Weight: 68 kg       Palliative Assessment/Data: 10%      Patient Active Problem List   Diagnosis Date Noted   CVA (cerebral vascular accident) (HCC) 06/30/2023   DNR (do not resuscitate) 06/30/2023   Dementia (HCC) 06/30/2023   Dysphagia 06/18/2023   Paroxysmal atrial fibrillation (HCC) 04/01/2023   Generalized weakness 04/01/2023   Elevated troponin 04/01/2023   Agitation-secondary to advanced age and a new environment. 04/01/2023   CKD stage 3a, GFR 45-59 ml/min (HCC) 10/23/2021   Routine general medical examination at a health care facility 06/29/2019   Aortic valve regurgitation 07/01/2017   Age-related physical debility 08/10/2014   SVT (supraventricular tachycardia) (HCC) 02/23/2014   DJD (degenerative joint disease) 04/07/2013   Breast cancer- s/p mastectomy 03/11/2013   GERD (gastroesophageal reflux disease) 03/10/2008   Unspecified glaucoma 09/30/2007   Essential hypertension 09/30/2007   History of GI bleed 09/30/2007    Palliative Care Assessment & Plan   HPI: JATARA HUETTNER is a 89 y.o. female with medical history significant for atrial fibrillation not anticoagulated, paroxysmal SVT, hypertension, and history of hospital delirium who presents with difficulty swallowing. Palliative care asked to be involved to help with end of life care and assess for  IP hospice appropriateness.   Assessment: Elderly ill-appearing female resting in bed. No family at the bedside. She is alert and acknowledges my presence. She shakes her head yes/no to questions but unsure if she comprehends. Speech is unintelligible. She is in no distress. A review of patient's past medical history inclusive of A-fib, paroxysmal SVT, hypertension, and cognitive impairment.    SUMMARY OF RECOMMENDATIONS   DNAR/DNI   Comfort Measures in the setting of stroke   Medications per [facility]   Approved for [facility]- awaiting bed for transfer   Ongoing PMT support   Goals of Care  and Additional Recommendations: Limitations on Scope of Treatment: Full Comfort Care  Code Status: DNR  Prognosis:  Limited days  Discharge Planning: Hospice facility  Care plan was discussed with Dr. Pola Corn, primary nurse  Thank you for allowing the Palliative Medicine Team to assist in the care of this patient.  Total time: 25 minutes   Time spent includes: Detailed review of medical records (labs, imaging, vital signs), medically appropriate exam, discussion with treatment team, counseling and educating patient, family and/or staff, documenting clinical information, medication management and coordination of care.      Alex Gardener, Juel Burrow- [facility] Palliative Medicine Team  07/01/2023 12:16 PM  Office [phone]  Pager [phone]

## 2023-07-01 NOTE — Care Management Obs Status (Signed)
MEDICARE OBSERVATION STATUS NOTIFICATION   Patient Details  Name: BERRY GALLACHER MRN: [id number] Date of Birth: 09-Mar-1922   Medicare Observation Status Notification Given:  Yes    [place]ley Plan, RN 07/01/2023, 10:43 AM

## 2023-07-01 NOTE — Progress Notes (Signed)
Family at bedside. Pt sleeping. Family stated they didn't need anything.

## 2023-07-01 NOTE — TOC Initial Note (Signed)
Transition of Care (TOC) - Initial/Assessment Note   Spoke to family at bedside and daughter [name] via speaker phone . Observation letter discussed.   Patient from [place] , plan for [facility] at discharge. [facility] following  Patient Details  Name: [name] MRN: [id number] Date of Birth: 13-Mar-1922  Transition of Care [facility]) CM/SW Contact:    Kingsley Plan, RN Phone Number: 07/01/2023, 10:47 AM  Clinical Narrative:                   Expected Discharge Plan: Hospice Medical Facility Barriers to Discharge:  (inpatient hospice bed)   Patient Goals and CMS Choice     Choice offered to / list presented to : Adult Children      Expected Discharge Plan and Services   Discharge Planning Services: CM Consult Post Acute Care Choice: Hospice                   DME Arranged: N/A DME Agency: NA       HH Arranged: NA HH Agency: NA        Prior Living Arrangements/Services   Lives with:: Facility Resident Lacinda Axon) Patient language and need for interpreter reviewed:: Yes        Need for Family Participation in Patient Care:  (from [place])     Criminal Activity/Legal Involvement Pertinent to Current Situation/Hospitalization: No - Comment as needed  Activities of Daily Living   ADL Screening (condition at time of admission) Independently performs ADLs?: No Does the patient have a NEW difficulty with bathing/dressing/toileting/self-feeding that is expected to last >3 days?: Yes (Initiates electronic notice to provider for possible OT consult) (stroke workup) Does the patient have a NEW difficulty with getting in/out of bed, walking, or climbing stairs that is expected to last >3 days?: Yes (Initiates electronic notice to provider for possible PT consult) (stroke workup) Does the patient have a NEW difficulty with communication that is expected to last >3 days?: Yes (Initiates electronic notice to provider for possible SLP consult) (stroke  workup) Is the patient deaf or have difficulty hearing?: Yes Does the patient have difficulty seeing, even when wearing glasses/contacts?: Yes Does the patient have difficulty concentrating, remembering, or making decisions?: Yes  Permission Sought/Granted                  Emotional Assessment Appearance:: Appears stated age     Orientation: :  (confused)      Admission diagnosis:  CVA (cerebral vascular accident) [facility]) [I63.9] Cerebrovascular accident (CVA), unspecified mechanism (HCC) [I63.9] Patient Active Problem List   Diagnosis Date Noted   CVA (cerebral vascular accident) (HCC) 06/30/2023   DNR (do not resuscitate) 06/30/2023   Dementia (HCC) 06/30/2023   Dysphagia 06/18/2023   Paroxysmal atrial fibrillation (HCC) 04/01/2023   Generalized weakness 04/01/2023   Elevated troponin 04/01/2023   Agitation-secondary to advanced age and a new environment. 04/01/2023   CKD stage 3a, GFR 45-59 ml/min (HCC) 10/23/2021   Routine general medical examination at a health care facility 06/29/2019   Aortic valve regurgitation 07/01/2017   Age-related physical debility 08/10/2014   SVT (supraventricular tachycardia) (HCC) 02/23/2014   DJD (degenerative joint disease) 04/07/2013   Breast cancer- s/p mastectomy 03/11/2013   GERD (gastroesophageal reflux disease) 03/10/2008   Unspecified glaucoma 09/30/2007   Essential hypertension 09/30/2007   History of GI bleed 09/30/2007   PCP:  Eloisa Northern, MD Pharmacy:   [facility] [phone] - [place], [place] - [address]  AT [facility] [address] [place] [address] Phone: [phone] Fax: [phone]     Social Drivers of Health (SDOH) Social History: SDOH Screenings   Food Insecurity: Patient Unable To Answer (06/30/2023)  Housing: Patient Unable To Answer (06/30/2023)  Recent Concern: Housing - High Risk (06/19/2023)  Transportation Needs: Unknown (06/30/2023)  Utilities: Patient Unable To Answer (06/30/2023)   Depression (PHQ2-9): Medium Risk (11/01/2021)  Financial Resource Strain: Low Risk  (08/18/2017)  Physical Activity: Inactive (08/18/2017)  Social Connections: Patient Unable To Answer (06/30/2023)  Recent Concern: Social Connections - Moderately Isolated (06/19/2023)  Stress: No Stress Concern Present (08/18/2017)  Tobacco Use: Low Risk  (06/30/2023)   SDOH Interventions:     Readmission Risk Interventions    06/23/2023    9:56 AM  Readmission Risk Prevention Plan  Transportation Screening Complete  PCP or Specialist Appt within 5-7 Days Complete  Home Care Screening Complete  Medication Review (RN CM) Complete

## 2023-07-02 DIAGNOSIS — I639 Cerebral infarction, unspecified: Secondary | ICD-10-CM | POA: Diagnosis not present

## 2023-07-02 DIAGNOSIS — R627 Adult failure to thrive: Secondary | ICD-10-CM

## 2023-07-02 NOTE — Plan of Care (Signed)
  Problem: Pain Managment: Goal: General experience of comfort will improve and/or be controlled Outcome: Progressing   Problem: Safety: Goal: Ability to remain free from injury will improve Outcome: Progressing   Problem: Coping: Goal: Ability to identify and develop effective coping behavior will improve Outcome: Progressing

## 2023-07-02 NOTE — Plan of Care (Signed)
  Problem: Education: Goal: Knowledge of the prescribed therapeutic regimen will improve Outcome: Not Progressing   Problem: Coping: Goal: Ability to identify and develop effective coping behavior will improve Outcome: Not Progressing   Problem: Clinical Measurements: Goal: Quality of life will improve Outcome: Not Progressing   Problem: Respiratory: Goal: Verbalizations of increased ease of respirations will increase Outcome: Not Progressing   Problem: Role Relationship: Goal: Family's ability to cope with current situation will improve Outcome: Not Progressing Goal: Ability to verbalize concerns, feelings, and thoughts to partner or family member will improve Outcome: Not Progressing   Problem: Pain Management: Goal: Satisfaction with pain management regimen will improve Outcome: Not Progressing   Problem: Education: Goal: Knowledge of General Education information will improve Description: Including pain rating scale, medication(s)/side effects and non-pharmacologic comfort measures Outcome: Not Progressing   Problem: Health Behavior/Discharge Planning: Goal: Ability to manage health-related needs will improve Outcome: Not Progressing   Problem: Clinical Measurements: Goal: Ability to maintain clinical measurements within normal limits will improve Outcome: Not Progressing Goal: Will remain free from infection Outcome: Not Progressing Goal: Diagnostic test results will improve Outcome: Not Progressing Goal: Respiratory complications will improve Outcome: Not Progressing Goal: Cardiovascular complication will be avoided Outcome: Not Progressing   Problem: Activity: Goal: Risk for activity intolerance will decrease Outcome: Not Progressing   Problem: Nutrition: Goal: Adequate nutrition will be maintained Outcome: Not Progressing   Problem: Coping: Goal: Level of anxiety will decrease Outcome: Not Progressing   Problem: Elimination: Goal: Will not  experience complications related to bowel motility Outcome: Not Progressing Goal: Will not experience complications related to urinary retention Outcome: Not Progressing   Problem: Pain Managment: Goal: General experience of comfort will improve and/or be controlled Outcome: Not Progressing   Problem: Safety: Goal: Ability to remain free from injury will improve Outcome: Not Progressing   Problem: Skin Integrity: Goal: Risk for impaired skin integrity will decrease Outcome: Not Progressing

## 2023-07-02 NOTE — Progress Notes (Signed)
[facility] Liaison Note    Unfortunately, [facility] is not able to offer a bed today. Family and [facility] manager aware. Offered [facility], inpatient hospice care in [place], but daughter does not want to consider moving her mother out of [place].    Hospital liaison will follow up tomorrow or sooner if a room becomes available.    Please do not hesitate to call with questions.    Glenna Fellows, BSN, RN, [facility] H[facility]ice Liaison [phone]

## 2023-07-02 NOTE — Progress Notes (Signed)
   CC: Right-sided weakness   Brief narrative:  [name] is a 89 y.o. female with a history of hypertension, atrial fibrillation, paroxysmal SVT, breast cancer, dementia.  Patient presented secondary to aphasia and right-sided facial droop with associated right-sided weakness.  Initial CT imaging negative for acute abnormality.  Neurology was consulted.  Patient with suspected large vessel occlusion by exam, not a candidate for IR management.  Patient transitioned to comfort measures.  Subjective:  No concerns overnight noted.  Objective:  BP 123/73 (BP Location: Left Arm)   Pulse (!) 105   Temp 98.2 F (36.8 C) (Oral)   Resp 19   Ht 5\' 6"  (1.676 m)   Wt 68 kg   SpO2 100%   BMI 24.20 kg/m   General exam: Appears calm and comfortable Respiratory system: Clear to auscultation. Respiratory effort normal. Cardiovascular system: S1 & S2 heard, RRR. No murmurs   Assessment/Plan:  Principal Problem:   CVA (cerebral vascular accident) [facility]) Active Problems:   Paroxysmal atrial fibrillation (HCC)   Essential hypertension   Dementia (HCC)   DNR (do not resuscitate)  Continue plans to transfer to [facility].  Jacquelin Hawking, MD [facility] 07/02/2023, 3:44 PM

## 2023-07-02 NOTE — Consult Note (Signed)
[facility] Liaison Consult Note   07/02/2023  [name] 04/20/1922 [id number]  Insurance: Medicare ACO REACH  Primary Care Provider: Eloisa Northern, MD noted with [facility] which is listed to provide the transition of care follow up appointment.   Chart reviewed  for less than 7 days readmission,  as reviewed from Inpatient [facility] team and Palliative consult notes and reveals the patient is currently transitioning to [facility] Palliative Care for potential Hospice facility.   Plan: Patient will have full care coordination services through Hospice and needs will be met at the hospice level of care. No planned follow up for transitional needs.  *Will sign off at transition from hospital.  For questions, .  Charlesetta Shanks, RN, BSN, CCM [employer], [facility] Liaison Direct Dial: [phone] or secure chat Email: [email or web address]

## 2023-07-02 NOTE — Progress Notes (Signed)
Daily Progress Note   Patient Name: [name]       Date: 07/02/2023 DOB: 12-Jan-1922  Age: 89 y.o. MRN#: [id number] Attending Physician: Narda Bonds, MD Primary Care Physician: Eloisa Northern, MD Admit Date: 06/30/2023  Reason for Consultation/Follow-up: Inpatient hospice referral  Subjective: Pt sleeping soundly in bed. No family at bedside.   Length of Stay: 1  Current Medications: Scheduled Meds:   sodium chloride flush  3 mL Intravenous Once     PRN Meds: acetaminophen **OR** acetaminophen, antiseptic oral rinse, glycopyrrolate **OR** glycopyrrolate **OR** glycopyrrolate, haloperidol **OR** haloperidol **OR** haloperidol lactate, LORazepam **OR** LORazepam **OR** LORazepam, morphine CONCENTRATE **OR** morphine CONCENTRATE, ondansetron **OR** ondansetron (ZOFRAN) IV, polyvinyl alcohol  Physical Exam Constitutional:      General: She is not in acute distress.    Appearance: She is ill-appearing.  Pulmonary:     Effort: Pulmonary effort is normal. No respiratory distress.  Musculoskeletal:     Right lower leg: No edema.     Left lower leg: No edema.  Skin:    General: Skin is warm and dry.             Vital Signs: BP 123/73 (BP Location: Left Arm)   Pulse (!) 105   Temp 98.2 F (36.8 C) (Oral)   Resp 19   Ht 5\' 6"  (1.676 m)   Wt 68 kg   SpO2 100%   BMI 24.20 kg/m  SpO2: SpO2: 100 % O2 Device: O2 Device: Room Air O2 Flow Rate:    Intake/output summary:  Intake/Output Summary (Last 24 hours) at 07/02/2023 1409 Last data filed at 07/02/2023 0100 Gross per 24 hour  Intake --  Output 350 ml  Net -350 ml   LBM:   Baseline Weight: Weight: 68 kg Most recent weight: Weight: 68 kg       Palliative Assessment/Data: 10%     Patient Active Problem List   Diagnosis Date  Noted   CVA (cerebral vascular accident) (HCC) 06/30/2023   DNR (do not resuscitate) 06/30/2023   Dementia (HCC) 06/30/2023   Dysphagia 06/18/2023   Paroxysmal atrial fibrillation (HCC) 04/01/2023   Generalized weakness 04/01/2023   Elevated troponin 04/01/2023   Agitation-secondary to advanced age and a new environment. 04/01/2023   CKD stage 3a, GFR 45-59 ml/min (HCC) 10/23/2021   Routine general medical examination at a health care facility 06/29/2019   Aortic valve regurgitation 07/01/2017   Age-related physical debility 08/10/2014   SVT (supraventricular tachycardia) (HCC) 02/23/2014   DJD (degenerative joint disease) 04/07/2013   Breast cancer- s/p mastectomy 03/11/2013   GERD (gastroesophageal reflux disease) 03/10/2008   Unspecified glaucoma 09/30/2007   Essential hypertension 09/30/2007   History of GI bleed 09/30/2007    Palliative Care Assessment & Plan   HPI: [name] is a 89 y.o. female with medical history significant for atrial fibrillation not anticoagulated, paroxysmal SVT, hypertension, and history of hospital delirium who presents with difficulty swallowing. Palliative care asked to be involved to help with end of life care and assess for IP hospice appropriateness   Assessment: Elderly ill-appearing female sleeping soundly in bed. No family at the bedside. She is in no distress. A review of patient's  past medical history inclusive of A-fib, paroxysmal SVT, hypertension, and cognitive impairment.   Per Pablo [facility], still waiting for bed a [facility]. Ines Bloomer advises family was offered bed at [facility] facility but refused stating they want the patient to remsin in [place].  SUMMARY OF RECOMMENDATIONS   DNAR/DNI   Comfort Measures in the setting of stroke   Medications per [facility]   Approved for [facility]- awaiting bed for transfer   Ongoing PMT support    Goals of Care and Additional Recommendations: Limitations on Scope of Treatment:  Full Comfort Care  Code Status: DNR  Prognosis:  Limited days  Discharge Planning: Hospice facility  Care plan was discussed with Ines Bloomer, [facility] and Shanda Bumps, LCSW  Thank you for allowing the Palliative Medicine Team to assist in the care of this patient.  Total time: 25 minutes   Time spent includes: Detailed review of medical records (labs, imaging, vital signs), medically appropriate exam, discussion with treatment team, counseling and educating patient, family and/or staff, documenting clinical information, medication management and coordination of care.     *Please note that this is a verbal dictation therefore any spelling or grammatical errors are due to the "Dragon Medical One" system interpretation.     Alex Gardener, Juel Burrow- [facility] Palliative Medicine Team  07/02/2023 2:09 PM  Office [phone]  Pager [phone]

## 2023-07-02 NOTE — TOC Progression Note (Signed)
Transition of Care [facility]) - Progression Note    Patient Details  Name: [name] MRN: [id number] Date of Birth: 1922/05/27  Transition of Care [facility]) CM/SW Contact  Carley Hammed, LCSW Phone Number: 07/02/2023, 9:56 AM  Clinical Narrative:     CSW was advised that [name] continues to be full, but beds are available at [facility] location if family is interested. CSW spoke with [name] who states they had been offered that and are not interested in pursuing [facility] at this time. Family in agreement to wait for a [facility] bed. TOC will continue to follow.   Expected Discharge Plan: Hospice Medical Facility Barriers to Discharge:  (inpatient hospice bed)  Expected Discharge Plan and Services   Discharge Planning Services: CM Consult Post Acute Care Choice: Hospice                   DME Arranged: N/A DME Agency: NA       HH Arranged: NA HH Agency: NA         Social Determinants of Health (SDOH) Interventions SDOH Screenings   Food Insecurity: Patient Unable To Answer (06/30/2023)  Housing: Patient Unable To Answer (06/30/2023)  Recent Concern: Housing - High Risk (06/19/2023)  Transportation Needs: Unknown (06/30/2023)  Utilities: Patient Unable To Answer (06/30/2023)  Depression (PHQ2-9): Medium Risk (11/01/2021)  Financial Resource Strain: Low Risk  (08/18/2017)  Physical Activity: Inactive (08/18/2017)  Social Connections: Patient Unable To Answer (06/30/2023)  Recent Concern: Social Connections - Moderately Isolated (06/19/2023)  Stress: No Stress Concern Present (08/18/2017)  Tobacco Use: Low Risk  (06/30/2023)    Readmission Risk Interventions    06/23/2023    9:56 AM  Readmission Risk Prevention Plan  Transportation Screening Complete  PCP or Specialist Appt within 5-7 Days Complete  Home Care Screening Complete  Medication Review (RN CM) Complete

## 2023-07-03 DIAGNOSIS — Z789 Other specified health status: Secondary | ICD-10-CM

## 2023-07-03 DIAGNOSIS — I639 Cerebral infarction, unspecified: Secondary | ICD-10-CM | POA: Diagnosis not present

## 2023-07-03 NOTE — Progress Notes (Signed)
Pt discharged to [facility] via [place]. Report called to facility.

## 2023-07-03 NOTE — TOC CM/SW Note (Signed)
[facility] called. Paperwork and signed DNR on chart

## 2023-07-03 NOTE — TOC Transition Note (Signed)
Transition of Care [facility]) - Discharge Note   Patient Details  Name: [name] MRN: [id number] Date of Birth: 13-Dec-1921  [facility]) CM/SW Contact:  Carley Hammed, LCSW Phone Number: 07/03/2023, 4:18 PM   Clinical Narrative:    Pt to be transported to [facility] via [place]. Nurse to call report to [phone]   Final next level of care: Hospice Medical Facility Barriers to Discharge: Barriers Resolved   Patient Goals and CMS Choice     Choice offered to / list presented to : Adult Children      Discharge Placement              Patient chooses bed at:  [facility]) Patient to be transferred to facility by: [facility] Name of family member notified: Ann Patient and family notified of of transfer: 07/03/23  Discharge Plan and Services Additional resources added to the After Visit Summary for     Discharge Planning Services: CM Consult Post Acute Care Choice: Hospice          DME Arranged: N/A DME Agency: NA       HH Arranged: NA HH Agency: NA        Social Drivers of Health (SDOH) Interventions SDOH Screenings   Food Insecurity: Patient Unable To Answer (06/30/2023)  Housing: Patient Unable To Answer (06/30/2023)  Recent Concern: Housing - High Risk (06/19/2023)  Transportation Needs: Unknown (06/30/2023)  Utilities: Patient Unable To Answer (06/30/2023)  Depression (PHQ2-9): Medium Risk (11/01/2021)  Financial Resource Strain: Low Risk  (08/18/2017)  Physical Activity: Inactive (08/18/2017)  Social Connections: Patient Unable To Answer (06/30/2023)  Recent Concern: Social Connections - Moderately Isolated (06/19/2023)  Stress: No Stress Concern Present (08/18/2017)  Tobacco Use: Low Risk  (06/30/2023)     Readmission Risk Interventions    06/23/2023    9:56 AM  Readmission Risk Prevention Plan  Transportation Screening Complete  PCP or Specialist Appt within 5-7 Days Complete  Home Care Screening Complete  Medication Review (RN CM) Complete

## 2023-07-03 NOTE — Plan of Care (Signed)
  Problem: Coping: Goal: Level of anxiety will decrease Outcome: Progressing   Problem: Pain Managment: Goal: General experience of comfort will improve and/or be controlled Outcome: Progressing   Problem: Activity: Goal: Risk for activity intolerance will decrease Outcome: Progressing   Problem: Clinical Measurements: Goal: Will remain free from infection Outcome: Progressing   Problem: Pain Management: Goal: Satisfaction with pain management regimen will improve Outcome: Progressing

## 2023-07-03 NOTE — Discharge Summary (Signed)
Physician Discharge Summary   Patient: [name] MRN: [id number] DOB: Nov 17, 1921  Admit date:     06/30/2023  Discharge date: 07/03/23  Discharge Physician: Jacquelin Hawking, MD   PCP: Eloisa Northern, MD   Recommendations at discharge:  Comfort measures  Discharge Diagnoses: Principal Problem:   CVA (cerebral vascular accident) [facility]) Active Problems:   Paroxysmal atrial fibrillation [facility])   Essential hypertension   Dementia (HCC)   DNR (do not resuscitate)  Resolved Problems:   * No resolved hospital problems. *  Hospital Course:  [name] is a 89 y.o. female with a history of hypertension, atrial fibrillation, paroxysmal SVT, breast cancer, dementia.  Patient presented secondary to aphasia and right-sided facial droop with associated right-sided weakness.  Initial CT imaging negative for acute abnormality.  Neurology was consulted.  Patient with suspected large vessel occlusion by exam, not a candidate for IR management.  Patient transitioned to comfort measures and discharged to hospice.   Consultants: Palliative care, neurology Procedures performed: None  Disposition: Hospice care Diet recommendation: as tolerated  DISCHARGE MEDICATION: Allergies as of 07/03/2023       Reactions   Codeine Nausea And Vomiting        Medication List     STOP taking these medications    acetaminophen 500 MG tablet Commonly known as: TYLENOL   amLODipine 10 MG tablet Commonly known as: NORVASC   cyanocobalamin 1000 MCG tablet Commonly known as: VITAMIN B12   cyanocobalamin 1000 MCG/ML injection Commonly known as: VITAMIN B12   diclofenac sodium 1 % Gel Commonly known as: VOLTAREN   feeding supplement Liqd   FeroSul 325 (65 FE) MG tablet Generic drug: ferrous sulfate   latanoprost 0.005 % ophthalmic solution Commonly known as: XALATAN   loperamide 2 MG capsule Commonly known as: IMODIUM   metoprolol tartrate 50 MG tablet Commonly known as: LOPRESSOR    OLANZapine 2.5 MG tablet Commonly known as: ZYPREXA   polyethylene glycol 17 g packet Commonly known as: MiraLax   Protonix 40 mg Generic drug: pantoprazole sodium   saccharomyces boulardii 250 MG capsule Commonly known as: FLORASTOR   thiamine 100 MG tablet Commonly known as: VITAMIN B1   triamcinolone cream 0.1 % Commonly known as: KENALOG   vitamin C 1000 MG tablet   Vitamin D (Ergocalciferol) 1.25 MG (50000 UNIT) Caps capsule Commonly known as: DRISDOL        Follow-up Information     [facility] Follow up.   Specialty: Hospice and Palliative Medicine [facility]: [facility] information: [address] [place] [address] [phone]               Discharge Exam: BP (!) 153/80 (BP Location: Right Arm)   Pulse 83   Temp 98.7 F (37.1 C) (Oral)   Resp 17   Ht 5\' 6"  (1.676 m)   Wt 68 kg   SpO2 99%   BMI 24.20 kg/m   General exam: Appears calm and comfortable   Condition at discharge: Comfort measures  The results of significant diagnostics from this hospitalization (including imaging, microbiology, ancillary and laboratory) are listed below for reference.   Imaging Studies: CT HEAD CODE STROKE WO CONTRAST Result Date: 06/30/2023 CLINICAL DATA:  Code stroke.  89 year old female. EXAM: CT HEAD WITHOUT CONTRAST TECHNIQUE: Contiguous axial images were obtained from the base of the skull through the vertex without intravenous contrast. RADIATION DOSE REDUCTION: This exam was performed according to the departmental dose-optimization program which includes automated exposure  control, adjustment of the mA and/or kV according to patient size and/or use of iterative reconstruction technique. COMPARISON:  Brain MRI 07/27/2015.  Head CT 06/18/2023. FINDINGS: Brain: No midline shift, mass effect, or evidence of intracranial mass lesion. No acute intracranial hemorrhage identified. No ventriculomegaly. Heterogeneous bilateral cerebral  white matter, bilateral deep gray nuclei. The appearance is that of advanced chronic small vessel disease. Bilateral thalamic involvement, right caudothalamic involvement, deep white matter capsule involvement appears stable from earlier this month. Superimposed small chronic left posterior and superior cerebellar infarcts also appears stable. Vascular: No suspicious intracranial vascular hyperdensity. Calcified atherosclerosis at the skull base. Skull: Stable.  No acute osseous abnormality identified. Sinuses/Orbits: Visualized paranasal sinuses and mastoids are stable and well aerated. Other: No gaze deviation. Visualized scalp soft tissues are within normal limits. ASPECTS [facility] Stroke Program Early CT Score) Total score (0-10 with 10 being normal): 10 IMPRESSION: 1. No acute cortically based infarct or intracranial hemorrhage identified. ASPECTS 10. 2. Stable non contrast CT appearance of advanced chronic small vessel ischemia. 3. These results were communicated to Dr. Selina [name] at 11:47 am on 06/30/2023 by text page via the [facility] messaging system. Electronically Signed   By: [name] M.D.   On: 06/30/2023 11:48   CT Head Wo Contrast Result Date: 06/18/2023 CLINICAL DATA:  Altered mental status, nontraumatic (Ped 0-17y) EXAM: CT HEAD WITHOUT CONTRAST TECHNIQUE: Contiguous axial images were obtained from the base of the skull through the vertex without intravenous contrast. RADIATION DOSE REDUCTION: This exam was performed according to the departmental dose-optimization program which includes automated exposure control, adjustment of the mA and/or kV according to patient size and/or use of iterative reconstruction technique. COMPARISON:  CT head 04/01/2023 FINDINGS: Brain: Patchy and confluent areas of decreased attenuation are noted throughout the deep and periventricular white matter of the cerebral hemispheres bilaterally, compatible with chronic microvascular ischemic disease. No evidence of large-territorial  acute infarction. No parenchymal hemorrhage. No mass lesion. No extra-axial collection. No mass effect or midline shift. No hydrocephalus. Basilar cisterns are patent. Vascular: No hyperdense vessel. Skull: No acute fracture or focal lesion. Sinuses/Orbits: Paranasal sinuses and mastoid air cells are clear. Bilateral lens replacement. The orbits are unremarkable. Other: None. IMPRESSION: No acute intracranial abnormality. Electronically Signed   By: Tish Frederickson M.D.   On: 06/18/2023 22:17   DG Chest Portable 1 View Result Date: 06/18/2023 CLINICAL DATA:  ams, weakness EXAM: PORTABLE CHEST 1 VIEW COMPARISON:  Chest x-ray 03/31/2023 FINDINGS: Patient is rotated. The heart and mediastinal contours are unchanged. Atherosclerotic plaque. Question developing right lower lobe airspace opacity. No pulmonary edema. No pleural effusion. No pneumothorax. No acute osseous abnormality. Bilateral shoulder degenerative changes. There IMPRESSION: 1. Question developing right lower lobe airspace opacity. Recommend repeat chest x-ray PA and lateral view of the chest with improved patient positioning. 2.  Aortic Atherosclerosis (ICD10-I70.0). Electronically Signed   By: Tish Frederickson M.D.   On: 06/18/2023 22:14    Microbiology: Results for orders placed or performed during the hospital encounter of 06/18/23  Resp panel by RT-PCR (RSV, Flu A&B, Covid) Anterior Nasal Swab     Status: None   Collection Time: 06/18/23  9:59 PM   Specimen: Anterior Nasal Swab  Result Value Ref Range Status   SARS Coronavirus 2 by RT PCR NEGATIVE NEGATIVE Final    Comment: (NOTE) SARS-CoV-2 target nucleic acids are NOT DETECTED.  The SARS-CoV-2 RNA is generally detectable in upper respiratory specimens during the acute phase of infection. The lowest concentration  of SARS-CoV-2 viral copies this assay can detect is 138 copies/mL. A negative result does not preclude SARS-Cov-2 infection and should not be used as the sole basis for  treatment or other patient management decisions. A negative result may occur with  improper specimen collection/handling, submission of specimen other than nasopharyngeal swab, presence of viral mutation(s) within the areas targeted by this assay, and inadequate number of viral copies(<138 copies/mL). A negative result must be combined with clinical observations, patient history, and epidemiological information. The expected result is Negative.  Fact Sheet for Patients:  BloggerCourse.com  Fact Sheet for Healthcare Providers:  SeriousBroker.it  This test is no t yet approved or cleared by the [place] FDA and  has been authorized for detection and/or diagnosis of SARS-CoV-2 by FDA under an Emergency Use Authorization (EUA). This EUA will remain  in effect (meaning this test can be used) for the duration of the COVID-19 declaration under Section 564(b)(1) of the Act, 21 U.S.C.section 360bbb-3(b)(1), unless the authorization is terminated  or revoked sooner.       Influenza A by PCR NEGATIVE NEGATIVE Final   Influenza B by PCR NEGATIVE NEGATIVE Final    Comment: (NOTE) The Xpert Xpress SARS-CoV-2/FLU/RSV plus assay is intended as an aid in the diagnosis of influenza from Nasopharyngeal swab specimens and should not be used as a sole basis for treatment. Nasal washings and aspirates are unacceptable for Xpert Xpress SARS-CoV-2/FLU/RSV testing.  Fact Sheet for Patients: BloggerCourse.com  Fact Sheet for Healthcare Providers: SeriousBroker.it  This test is not yet approved or cleared by the [place] FDA and has been authorized for detection and/or diagnosis of SARS-CoV-2 by FDA under an Emergency Use Authorization (EUA). This EUA will remain in effect (meaning this test can be used) for the duration of the COVID-19 declaration under Section 564(b)(1) of the Act, 21  U.S.C. section 360bbb-3(b)(1), unless the authorization is terminated or revoked.     Resp Syncytial Virus by PCR NEGATIVE NEGATIVE Final    Comment: (NOTE) Fact Sheet for Patients: BloggerCourse.com  Fact Sheet for Healthcare Providers: SeriousBroker.it  This test is not yet approved or cleared by the [place] FDA and has been authorized for detection and/or diagnosis of SARS-CoV-2 by FDA under an Emergency Use Authorization (EUA). This EUA will remain in effect (meaning this test can be used) for the duration of the COVID-19 declaration under Section 564(b)(1) of the Act, 21 U.S.C. section 360bbb-3(b)(1), unless the authorization is terminated or revoked.  Performed at [facility], [address] [place], [place] [address]   Group A Strep by PCR     Status: None   Collection Time: 06/18/23  9:59 PM   Specimen: Anterior Nasal Swab; Sterile Swab  Result Value Ref Range Status   Group A Strep by PCR NOT DETECTED NOT DETECTED Final    Comment: Performed at [facility], [address] [place], [place] [address]  Blood culture (routine x 2)     Status: None   Collection Time: 06/19/23 12:50 AM   Specimen: BLOOD  Result Value Ref Range Status   Specimen Description   Final    BLOOD BLOOD RIGHT WRIST Performed at [facility], [address], [place], [place] [address]    Special Requests   Final    BOTTLES DRAWN AEROBIC AND ANAEROBIC Blood Culture results may not be optimal due to an inadequate volume of blood received in culture bottles Performed at [facility], [address] [place], [place] [address]    Culture   Final    NO GROWTH 5 DAYS Performed at [facility], [address] [place], [place] [address]    Report Status 06/24/2023 FINAL  Final  Blood culture (routine x 2)     Status: None   Collection Time: 06/19/23 12:54 AM    Specimen: BLOOD  Result Value Ref Range Status   Specimen Description   Final    BLOOD BLOOD LEFT HAND Performed at [facility], [address] [place], [place] [address]    Special Requests   Final    BOTTLES DRAWN AEROBIC AND ANAEROBIC Blood Culture results may not be optimal due to an inadequate volume of blood received in culture bottles Performed at [facility], [address], [place], [place] [address]    Culture   Final    NO GROWTH 5 DAYS Performed at [facility], [address] [place], [place] [address]    Report Status 06/24/2023 FINAL  Final    Labs: CBC: Recent Labs  Lab 06/30/23 1348  HGB 11.2*  HCT 33.0*   Basic Metabolic Panel: Recent Labs  Lab 06/30/23 1348  NA 141  K 3.5  CL 106  GLUCOSE 107*  BUN 17  CREATININE 0.80    CBG: Recent Labs  Lab 06/30/23 1338  GLUCAP 110*    Discharge time spent: 35 minutes.  Signed: Jacquelin Hawking, MD [facility] 07/03/2023

## 2023-07-03 NOTE — Progress Notes (Signed)
Daily Progress Note   Patient Name: [name]       Date: 07/03/2023 DOB: May 01, 1922  Age: 89 y.o. MRN#: [id number] Attending Physician: Narda Bonds, MD Primary Care Physician: Eloisa Northern, MD Admit Date: 06/30/2023  Reason for Consultation/Follow-up: Inpatient hospice referral  Subjective: Pt alert, resting in bed. She is non-verbal, agitated.  Length of Stay: 2  Current Medications: Scheduled Meds:     PRN Meds: acetaminophen **OR** acetaminophen, antiseptic oral rinse, glycopyrrolate **OR** glycopyrrolate **OR** glycopyrrolate, haloperidol **OR** haloperidol **OR** haloperidol lactate, LORazepam **OR** LORazepam **OR** LORazepam, morphine CONCENTRATE **OR** morphine CONCENTRATE, ondansetron **OR** ondansetron (ZOFRAN) IV, polyvinyl alcohol  Physical Exam Vitals reviewed.  Constitutional:      Appearance: She is ill-appearing.  Pulmonary:     Effort: Pulmonary effort is normal. No respiratory distress.  Musculoskeletal:     Right lower leg: No edema.     Left lower leg: No edema.  Skin:    General: Skin is warm and dry.  Neurological:     Mental Status: She is alert. She is disoriented.     Comments: Anxious, agitated             Vital Signs: BP (!) 153/80 (BP Location: Right Arm)   Pulse 83   Temp 98.7 F (37.1 C) (Oral)   Resp 17   Ht 5\' 6"  (1.676 m)   Wt 68 kg   SpO2 99%   BMI 24.20 kg/m  SpO2: SpO2: 99 % O2 Device: O2 Device: Room Air O2 Flow Rate:    Intake/output summary:  Intake/Output Summary (Last 24 hours) at 07/03/2023 1159 Last data filed at 07/03/2023 0900 Gross per 24 hour  Intake 0 ml  Output 350 ml  Net -350 ml   LBM:   Baseline Weight: Weight: 68 kg Most recent weight: Weight: 68 kg       Palliative Assessment/Data: 10%      Patient  Active Problem List   Diagnosis Date Noted   CVA (cerebral vascular accident) (HCC) 06/30/2023   DNR (do not resuscitate) 06/30/2023   Dementia (HCC) 06/30/2023   Dysphagia 06/18/2023   Paroxysmal atrial fibrillation (HCC) 04/01/2023   Generalized weakness 04/01/2023   Elevated troponin 04/01/2023   Agitation-secondary to advanced age and a new environment. 04/01/2023   CKD stage 3a, GFR 45-59 ml/min (HCC) 10/23/2021   Routine general medical examination at a health care facility 06/29/2019   Aortic valve regurgitation 07/01/2017   Age-related physical debility 08/10/2014   SVT (supraventricular tachycardia) (HCC) 02/23/2014   DJD (degenerative joint disease) 04/07/2013   Breast cancer- s/p mastectomy 03/11/2013   GERD (gastroesophageal reflux disease) 03/10/2008   Unspecified glaucoma 09/30/2007   Essential hypertension 09/30/2007   History of GI bleed 09/30/2007    Palliative Care Assessment & Plan   HPI: [name] is a 89 y.o. female with medical history significant for atrial fibrillation not anticoagulated, paroxysmal SVT, hypertension, and history of hospital delirium who presents with difficulty swallowing. Palliative care asked to be involved to help with end of life care and assess for IP hospice appropriateness   Assessment: Elderly ill-appearing female, alert and resting in bed. She appears agitated, pulling at blankets. Family at  bedside.  She is in no distress.  RN to give Ativan for agitation and reassess agitation prior to transfer.   A review of patient's past medical history inclusive of A-fib, paroxysmal SVT, hypertension, and cognitive impairment.   Per Pablo Lawrence, they have a bed available for her today. He will reach out to family to coordinate transfer to [facility] today.   SUMMARY OF RECOMMENDATIONS   DNAR/DNI   Comfort Measures in the setting of stroke   Give Ativan for agitation, assess if needed for transfer as well.    Transfer to  [facility] today   Ongoing PMT support    Goals of Care and Additional Recommendations: Limitations on Scope of Treatment: Full Comfort Care  Code Status: DNR  Prognosis:  Hours - Days  Discharge Planning: Hospice facility  Care plan was discussed with Primary RN, Shawn, [facility]  Thank you for allowing the Palliative Medicine Team to assist in the care of this patient.  Total time: 25 minutes  Time spent includes: Detailed review of medical records (labs, imaging, vital signs), medically appropriate exam, discussion with treatment team, counseling and educating patient, family and/or staff, documenting clinical information, medication management and coordination of care.     *Please note that this is a verbal dictation therefore any spelling or grammatical errors are due to the "Dragon Medical One" system interpretation.     Alex Gardener, Juel Burrow- [facility] Palliative Medicine Team  07/03/2023 11:59 AM  Office [phone]  Pager [phone]

## 2023-07-05 DIAGNOSIS — I63312 Cerebral infarction due to thrombosis of left middle cerebral artery: Secondary | ICD-10-CM | POA: Diagnosis not present

## 2023-07-05 DIAGNOSIS — I482 Chronic atrial fibrillation, unspecified: Secondary | ICD-10-CM | POA: Diagnosis not present

## 2023-07-05 DIAGNOSIS — I10 Essential (primary) hypertension: Secondary | ICD-10-CM | POA: Diagnosis not present

## 2023-07-05 DIAGNOSIS — I4719 Other supraventricular tachycardia: Secondary | ICD-10-CM | POA: Diagnosis not present

## 2023-07-06 DIAGNOSIS — I63312 Cerebral infarction due to thrombosis of left middle cerebral artery: Secondary | ICD-10-CM | POA: Diagnosis not present

## 2023-07-06 DIAGNOSIS — I482 Chronic atrial fibrillation, unspecified: Secondary | ICD-10-CM | POA: Diagnosis not present

## 2023-07-06 DIAGNOSIS — I4719 Other supraventricular tachycardia: Secondary | ICD-10-CM | POA: Diagnosis not present

## 2023-07-06 DIAGNOSIS — I10 Essential (primary) hypertension: Secondary | ICD-10-CM | POA: Diagnosis not present

## 2023-07-15 ENCOUNTER — Ambulatory Visit: Payer: Medicare Other | Admitting: Student

## 2023-07-15 ENCOUNTER — Ambulatory Visit: Payer: Medicare Other | Admitting: Adult Health

## 2023-08-02 DEATH — deceased
